# Patient Record
Sex: Female | Born: 1960 | Race: Black or African American | Hispanic: No | Smoking: Never smoker
Health system: Southern US, Community
[De-identification: ages and names within clinical notes are randomized; demographics above are authoritative.]

## PROBLEM LIST (undated history)

## (undated) DIAGNOSIS — I10 Essential (primary) hypertension: Secondary | ICD-10-CM

## (undated) DIAGNOSIS — K219 Gastro-esophageal reflux disease without esophagitis: Secondary | ICD-10-CM

## (undated) DIAGNOSIS — F32A Depression, unspecified: Secondary | ICD-10-CM

## (undated) DIAGNOSIS — R079 Chest pain, unspecified: Secondary | ICD-10-CM

## (undated) DIAGNOSIS — E079 Disorder of thyroid, unspecified: Secondary | ICD-10-CM

## (undated) DIAGNOSIS — E039 Hypothyroidism, unspecified: Secondary | ICD-10-CM

## (undated) DIAGNOSIS — R011 Cardiac murmur, unspecified: Secondary | ICD-10-CM

## (undated) HISTORY — DX: Depression, unspecified: F32.A

## (undated) HISTORY — DX: Cardiac murmur, unspecified: R01.1

## (undated) HISTORY — DX: Disorder of thyroid, unspecified: E07.9

## (undated) HISTORY — DX: Essential (primary) hypertension: I10

## (undated) HISTORY — DX: Hypothyroidism, unspecified: E03.9

## (undated) HISTORY — DX: Chest pain, unspecified: R07.9

## (undated) HISTORY — PX: ABDOMINAL HYSTERECTOMY: SHX81

---

## 2000-07-09 ENCOUNTER — Encounter: Admission: RE | Admit: 2000-07-09 | Discharge: 2000-07-09 | Payer: Self-pay | Admitting: Obstetrics and Gynecology

## 2000-07-09 ENCOUNTER — Encounter: Payer: Self-pay | Admitting: Obstetrics and Gynecology

## 2001-07-15 ENCOUNTER — Encounter: Admission: RE | Admit: 2001-07-15 | Discharge: 2001-07-15 | Payer: Self-pay | Admitting: Obstetrics and Gynecology

## 2001-07-15 ENCOUNTER — Encounter: Payer: Self-pay | Admitting: Obstetrics and Gynecology

## 2002-07-16 ENCOUNTER — Encounter: Payer: Self-pay | Admitting: Obstetrics and Gynecology

## 2002-07-16 ENCOUNTER — Encounter: Admission: RE | Admit: 2002-07-16 | Discharge: 2002-07-16 | Payer: Self-pay | Admitting: Obstetrics and Gynecology

## 2002-07-23 ENCOUNTER — Encounter: Admission: RE | Admit: 2002-07-23 | Discharge: 2002-07-23 | Payer: Self-pay | Admitting: Obstetrics and Gynecology

## 2002-07-23 ENCOUNTER — Encounter: Payer: Self-pay | Admitting: Obstetrics and Gynecology

## 2003-08-25 ENCOUNTER — Encounter: Admission: RE | Admit: 2003-08-25 | Discharge: 2003-08-25 | Payer: Self-pay | Admitting: Obstetrics and Gynecology

## 2004-10-03 ENCOUNTER — Ambulatory Visit (HOSPITAL_COMMUNITY): Admission: RE | Admit: 2004-10-03 | Discharge: 2004-10-03 | Payer: Self-pay | Admitting: Obstetrics and Gynecology

## 2005-10-23 ENCOUNTER — Encounter: Admission: RE | Admit: 2005-10-23 | Discharge: 2005-10-23 | Payer: Self-pay | Admitting: Obstetrics and Gynecology

## 2006-09-22 ENCOUNTER — Inpatient Hospital Stay (HOSPITAL_COMMUNITY): Admission: RE | Admit: 2006-09-22 | Discharge: 2006-09-24 | Payer: Self-pay | Admitting: Obstetrics & Gynecology

## 2006-09-22 ENCOUNTER — Encounter (INDEPENDENT_AMBULATORY_CARE_PROVIDER_SITE_OTHER): Payer: Self-pay | Admitting: Specialist

## 2006-11-05 ENCOUNTER — Encounter: Admission: RE | Admit: 2006-11-05 | Discharge: 2006-11-05 | Payer: Self-pay | Admitting: Obstetrics and Gynecology

## 2007-05-22 ENCOUNTER — Emergency Department (HOSPITAL_COMMUNITY): Admission: EM | Admit: 2007-05-22 | Discharge: 2007-05-22 | Payer: Self-pay | Admitting: Emergency Medicine

## 2007-05-25 ENCOUNTER — Ambulatory Visit (HOSPITAL_BASED_OUTPATIENT_CLINIC_OR_DEPARTMENT_OTHER): Admission: RE | Admit: 2007-05-25 | Discharge: 2007-05-25 | Payer: Self-pay | Admitting: Urology

## 2007-06-01 ENCOUNTER — Ambulatory Visit (HOSPITAL_COMMUNITY): Admission: RE | Admit: 2007-06-01 | Discharge: 2007-06-01 | Payer: Self-pay | Admitting: Urology

## 2007-11-19 ENCOUNTER — Encounter: Admission: RE | Admit: 2007-11-19 | Discharge: 2007-11-19 | Payer: Self-pay | Admitting: Obstetrics and Gynecology

## 2007-11-20 ENCOUNTER — Encounter: Admission: RE | Admit: 2007-11-20 | Discharge: 2007-11-20 | Payer: Self-pay | Admitting: Family Medicine

## 2008-02-25 ENCOUNTER — Encounter: Admission: RE | Admit: 2008-02-25 | Discharge: 2008-02-25 | Payer: Self-pay | Admitting: Family Medicine

## 2008-04-19 ENCOUNTER — Encounter: Admission: RE | Admit: 2008-04-19 | Discharge: 2008-04-19 | Payer: Self-pay | Admitting: General Surgery

## 2008-04-19 ENCOUNTER — Ambulatory Visit (HOSPITAL_COMMUNITY): Admission: RE | Admit: 2008-04-19 | Discharge: 2008-04-19 | Payer: Self-pay | Admitting: General Surgery

## 2008-04-19 ENCOUNTER — Encounter (INDEPENDENT_AMBULATORY_CARE_PROVIDER_SITE_OTHER): Payer: Self-pay | Admitting: General Surgery

## 2009-03-09 ENCOUNTER — Encounter: Admission: RE | Admit: 2009-03-09 | Discharge: 2009-03-09 | Payer: Self-pay | Admitting: Family Medicine

## 2010-03-12 ENCOUNTER — Encounter: Admission: RE | Admit: 2010-03-12 | Discharge: 2010-03-12 | Payer: Self-pay | Admitting: Internal Medicine

## 2010-10-07 ENCOUNTER — Encounter: Payer: Self-pay | Admitting: Family Medicine

## 2011-01-29 NOTE — Op Note (Signed)
NAME:  Debbie Brandt, Debbie Brandt               ACCOUNT NO.:  [id number]   MEDICAL RECORD NO.:  [id number]          PATIENT TYPE:  AMB   LOCATION:  [facility]                         FACILITY:  [facility]   PHYSICIAN:  Excell Seltzer. Annabell Howells, M.D.    DATE OF BIRTH:  1961-08-31   DATE OF PROCEDURE:  05/25/2007  DATE OF DISCHARGE:  05/25/2007                               OPERATIVE REPORT   PROCEDURE:  Cystoscopy, insertion of right double-J stent.   PREOPERATIVE DIAGNOSIS:  Right proximal ureteral stone.   POSTOPERATIVE DIAGNOSIS:  Right proximal ureteral stone.   DRAINS:  6-French x 24 cm double-J stent.   COMPLICATIONS:  None.   INDICATIONS:  The patient has a 5 x 8 mm right proximal ureteral stone  with obstruction.  She had had an ibuprofen earlier today, so was not a  candidate for immediate lithotripsy.  A stent is going to be placed and  she will be scheduled for lithotripsy next week.   FINDINGS AND PROCEDURE:  The patient was taken to the operating room.  She received Cipro.  A general anesthetic was induced.  She was placed  in lithotomy position.  Her perineum and genitalia were prepped with  Betadine solution.  She was draped in the usual sterile fashion.  Cystoscopy was performed using a 22-French scope and 12 and 70 degrees  lenses.  Examination revealed a normal urethra.  The bladder wall was  smooth and pale without tumor, stones or inflammation.  Ureteral  orifices were unremarkable.   A guidewire was then passed to the kidney under fluoroscopic guidance.  A 6-French 24 cm double-J stent was then placed over the wire and under  fluoroscopic guidance, the wire was removed leaving good coil in the  kidney, good coil in the bladder.  The bladder was drained.  The patient  was taken down from lithotomy position.  Her anesthetic was reversed.  She was moved to the recovery room in stable condition.  There were no  complications.      Excell Seltzer. Annabell Howells, M.D.  Electronically Signed     JJW/MEDQ   D:  06/04/2007  T:  06/04/2007  Job:  [id number]

## 2011-01-29 NOTE — Op Note (Signed)
NAME:  Debbie Brandt, Debbie Brandt               ACCOUNT NO.:  [id number]   MEDICAL RECORD NO.:  [id number]          PATIENT TYPE:  AMB   LOCATION:  [facility]S                          FACILITY:  [facility]   PHYSICIAN:  Angelia Mould. Derrell Lolling, M.D.DATE OF BIRTH:  03-May-1961   DATE OF PROCEDURE:  04/19/2008  DATE OF DISCHARGE:  04/19/2008                               OPERATIVE REPORT   PREOPERATIVE DIAGNOSIS:  Right breast mass.   POSTOPERATIVE DIAGNOSIS:  Right breast mass.   OPERATION PERFORMED:  Excision of right breast mass with needle  localization and specimen mammogram.   SURGEON:  Angelia Mould. Derrell Lolling, MD   OPERATIVE INDICATIONS:  This is a 50 year old black female who felt a  lump in her right breast in the upper outer quadrant about 5 months ago.  She had imaging studies and Dr. Beckie Salts performed ultrasound-guided  core biopsy of this area, which showed pathologically benign stromal  fibrosis.  Followup ultrasound on February 25, 2008, showed interval  significant increase in size of the irregular hypoechoic area at the 10  o'clock position of the right breast and he referred her to me for  excision.  On exam, she has a moderately large soft breast.  At 10  o'clock position, there is about a 2.5-cm palpable mass, which is not  fixed to the skin or the chest wall.  There is no axillary adenopathy.  Excision of this area was advised.  She underwent a needle localization  by Dr. Britta Mccreedy this morning who stated the tissues were extremely  fibrotic and dense, but the wire was placed through the area.  She was  then sent to [facility] for surgical excision.   OPERATIVE TECHNIQUE:  Following the needle localization, the patient was  brought to [facility] Operating Room.  She was taken to the operating  room and underwent general anesthesia with an LMA device.  The right  breast was prepped and draped in a sterile fashion.  The patient was  identified as correct patient, correct procedure, and  correct site.  Intravenous antibiotics were given.   I reviewed the x-rays showing the wire insertion area from lateral to  medial.  I could somewhat palpate the area of density.  I marked an  incision in the lateral aspect of the right breast in a curved  circumareolar orientation near the wire.  Marcaine 0.5% with epinephrine  was used as local infiltration anesthetic.  Incision was made.  Dissection was carried down into the breast tissue and widely around the  wire.  The breast tissue specimen was removed.  The specimen was marked  with the 6-color margin marker kit.  Specimen mammogram was performed  and Dr. Mayford Knife said that the metallic marker was within the center of  the specimen.  This was sent for routine histology.  The wound was  irrigated with saline.  Hemostasis was excellent and achieved with  electrocautery.  The deeper breast tissues were closed with interrupted  sutures of 3-0 Vicryl and the skin was closed with a running  subcuticular suture of 4-0 Monocryl and Dermabond.  Clean  bandages were  placed, and the patient was taken to the recovery room in stable  condition.   ESTIMATED BLOOD LOSS:  About 10 mL.   COMPLICATIONS:  None.   SPONGE, NEEDLE, AND COUNTS:  Correct.      Angelia Mould. Derrell Lolling, M.D.  Electronically Signed     HMI/MEDQ  D:  04/19/2008  T:  04/20/2008  Job:  [id number]   cc:   Jethro Bastos, M.D.  Gerrit Friends. Aldona Bar, M.D.

## 2011-02-01 NOTE — H&P (Signed)
NAME:  [name], [name]               ACCOUNT NO.:  [id number]   MEDICAL RECORD NO.:  [id number]          PATIENT TYPE:  AMB   LOCATION:  [facility]                           FACILITY:  [facility]   PHYSICIAN:  Gerrit Friends. Aldona Bar, M.D.   DATE OF BIRTH:  1961-04-12   DATE OF ADMISSION:  DATE OF DISCHARGE:                              HISTORY & PHYSICAL   DATE OF ADMISSION:  Monday, September 22, 2006   Debbie Brandt is a 50 year old gravida 1, para 1 who is being admitted  for a total abdominal hysterectomy with a preoperative diagnosis of a  likely submucosal myoma associated with bleeding and cramping.  This  patient was referred to me by Dr. Kyra Manges.  I saw this patient in  early November 2007.  Her history goes about 6 or 7years.  In 1999 she  underwent a laparoscopic tubal sterilization procedure and a  hysteroscopy and apparently at that time had a specimen taken at the  time of hysteroscopy which was consistent with a submucosal myoma.  She  had previously had a cesarean section for the delivery of her only child  several years earlier.  At that time she also underwent LUNA procedure.  In 2005, the patient related to Dr. Elana Alm that her periods were  becoming quite heavy and she did not want to use birth control pills.  Apparently at that time a hysterectomy was considered, but there was  difficulty in obtaining precertification.  She was placed on Depo-  Provera 150 mg IM every 3 months which did help her bleeding somewhat,  and this eventually had to be augmented with Premarin 0.625 mg once or  twice a day to prevent breakthrough bleeding.  She had an ultrasound in  early to mid 2007 consistent with a 6-cm uterine fibroid.  She was  referred to me in late October   Dictation ended at this point.      Gerrit Friends. Aldona Bar, M.D.  Electronically Signed     RMW/MEDQ  D:  09/19/2006  T:  09/19/2006  Job:  [id number]

## 2011-02-01 NOTE — H&P (Signed)
NAME:  Debbie Brandt, Debbie Brandt               ACCOUNT NO.:  [id number]   MEDICAL RECORD NO.:  [id number]          PATIENT TYPE:  AMB   LOCATION:  [facility]                           FACILITY:  [facility]   PHYSICIAN:  Gerrit Friends. Aldona Bar, M.D.   DATE OF BIRTH:  10-26-1960   DATE OF ADMISSION:  DATE OF DISCHARGE:                              HISTORY & PHYSICAL   HISTORY OF PRESENT ILLNESS:  This patient is to be admitted for surgery  on September 22, 2006.  Please have her history and physical to the Day  Surgery area.  She is scheduled for 7:30 that morning.  Debbie Brandt is  a 50 year old gravida 1, para 1 who is being admitted for a total  abdominal hysterectomy with a preoperative diagnosis of submucosal  myomas associated with dysmenorrhea.  Apparently, this problem goes back  about 5-6 years.  In late 1999, she underwent a laparoscopic tubal  sterilization procedure and a hysteroscopy and biopsy, and the biopsy  was consistent with a submucosal myoma.  Afterwards, she did fairly  well; although, in 2005, she began reporting to Dr. Elana Alm, the  referring physician, that her periods were becoming quite heavy.  Birth  control pills were suggested, but the patient did not want birth control  pills.  She actually was preferring a hysterectomy, but because of pre-  certification issues, a hysterectomy was a not a possibility.  Therefore, she was placed on Depo-Provera which she continued at 150 mg  IM every 3 months until probably late summer of 2007, at which time she  was referred to me for further management.  She was also begun on  Premarin 0.625 mg once to twice daily to prevent breakthrough bleeding.  It is of note as well that in early 2007 she had a pelvic ultrasound  which revealed a 6-cm uterine fibroid.  I saw this patient for the first  time in early November of 2007.  Patient was requesting a total  abdominal hysterectomy because of her continued bleeding and history of  fibroids and associated  dysmenorrhea.  She was examined at that time,  underwent a CBC, metabolic profile and TSH, all of which were normal.  Hysterectomy was scheduled, and was approved, and she is now being taken  to the OR for such procedure.  She did have a Pap smear in early 2007  which was within normal limits.  She had a mammogram in February of 2007  which was likewise reported as within normal limits.  Discussion was  carried out with the patient as to whether or not to remove her ovaries  in addition to the total abdominal hysterectomy.  The decision was made  to remove her ovaries only if intraoperative findings justified such.  She is now being taken to the operating room for total abdominal  hysterectomy for surgical treatment of her myomas, persistent  breakthrough bleeding and dysmenorrhea.   PAST MEDICAL HISTORY:  Patient has had a cesarean section with delivery  of her only child approximately 14 years ago and a laparoscopic tubal  sterilization with a hysteroscopy in 1999.  The patient has no known  allergies.   MEDICATIONS:  1. Levoxyl 0.75 mg daily.  2. Maxide - 25 - 1 every morning.  She recently discontinued her      Premarin 0.625 mg daily.   PAST MEDICAL HISTORY:  Otherwise essentially negative.   SOCIAL HISTORY:  Noncontributory.   FAMILY HISTORY:  Noncontributory.   REVIEW OF SYSTEMS:  Essentially negative.   PHYSICAL EXAMINATION:  VITAL SIGNS:  Blood pressure 140/70, temperature  98.2; pulse 80, and regular; respirations 18, and regular.  HEENT:  Negative.  NECK:  Thyroid not enlarged.  CHEST:  Clear to auscultation and percussion.  CARDIOVASCULAR:  Regular rhythm.  No murmurs.  BREASTS:  Negative.  ABDOMEN:  Finds a well-healed Pfannenstiel's incision from her previous  cesarean.  Abdomen is otherwise benign.  No masses are palpated.  Bowel  sounds are normal.  PELVIC:  Finds external genitalia and BUS normal.  Introitus __________  well supported.  Cervix with no gross  lesions.  Uterus feels to be upper  limits of normal size.  Adnexal area is negative.  RECTOVAGINAL:  Confirmatory.  NEUROLOGIC:  Physiologic.   IMPRESSION:  1. Known uterine fibroid, likely submucosal, with a history of      bleeding and dysmenorrhea.  2. Controlled hypothyroidism.   PLAN:  Patient will be taken to the operating room for surgical  treatment of her leiomyomatous uterus associated with bleeding and  cramping by undergoing a total abdominal hysterectomy.  Bilateral  salpingo-oophorectomy will be entertained if intraoperative findings  justifies such.  2.  The patient is aware of risks and benefits of  having the total abdominal hysterectomy, and wishes to proceed  accordingly.      Gerrit Friends. Aldona Bar, M.D.  Electronically Signed     RMW/MEDQ  D:  09/19/2006  T:  09/19/2006  Job:  [id number]

## 2011-02-01 NOTE — Discharge Summary (Signed)
Debbie Brandt, Debbie Brandt               ACCOUNT NO.:  [id number]   MEDICAL RECORD NO.:  [id number]          PATIENT TYPE:  INP   LOCATION:  9305                          FACILITY:  [facility]   PHYSICIAN:  Gerrit Friends. Aldona Bar, M.D.   DATE OF BIRTH:  04-30-1961   DATE OF ADMISSION:  09/22/2006  DATE OF DISCHARGE:                               DISCHARGE SUMMARY   DISCHARGE DIAGNOSES:  1. 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  2. Adenomyosis.  3. Clinical abnormal uterine bleeding and dysmenorrhea.   PROCEDURE:  Total abdominal hysterectomy.   SUMMARY:  This 50 year old patient was taken to the operating room on  the morning of September 22, 2006, having been referred to me by Dr. Kyra Manges for treatment of abnormal bleeding associated with dysmenorrhea  with a known submucosal myoma.  After a normal preoperative workup, the  patient was taken to the operating room at which time she underwent a  total abdominal hysterectomy.  Ovarian preservation was carried out  because the ovaries appeared completely normal, as did the appendix.   The procedure went well and postoperatively the patient's postoperative  course was totally benign.  Her comprehensive metabolic profile was  totally normal postoperatively and her postoperative hemoglobin was 9.4  with a white count of 8000 and a platelet count of 364,000.  On the  morning of January 9 she was ambulating well, tolerating a regular diet  well, having normal bowel and bladder function, was afebrile, her wound  was clean and dry, and she was desirous of discharge and accordingly was  discharged to home with appropriate instructions.   Her staples were removed on the morning of discharge and wound was steri-  stripped.  Discharge medications include iron 300 mg one to twice daily,  Motrin 600 mg every 6 hours as needed for pain, and Tylox one to two  every 4-6 hours as needed for more severe pain.  She discontinued her  Premarin which  she was using for management of her abnormal uterine  bleeding in association with Depo-Provera prior to the surgical  procedure, and after the surgical procedure continued to have no  menopausal symptoms; therefore, the Premarin was not restarted.   She will continue on her thyroid medication likewise postoperatively.   Followup in the office will be carried out in approximately 4 weeks'  time.  As mentioned, the patient received all explicit instructions at  the time of discharge and understood all instructions well.   The pathological report showed a 141-g uterus with a 4.5 x 3.0 x 3.0  submucosal myoma almost occupying the entire endometrial cavity, and  there were some additional small intramural myomas.  There was also  adenomyosis demonstrated.   CONDITION ON DISCHARGE:  Improved.      Gerrit Friends. Aldona Bar, M.D.  Electronically Signed     RMW/MEDQ  D:  09/24/2006  T:  09/24/2006  Job:  [id number]   cc:   S. Kyra Manges, M.D.  Fax: [phone]

## 2011-02-01 NOTE — Op Note (Signed)
NAME:  Debbie Brandt, Debbie Brandt               ACCOUNT NO.:  [id number]   MEDICAL RECORD NO.:  [id number]          PATIENT TYPE:  AMB   LOCATION:  [facility]                           FACILITY:  [facility]   PHYSICIAN:  Gerrit Friends. Aldona Bar, M.D.   DATE OF BIRTH:  [birth year]   DATE OF PROCEDURE:  09/22/2006  DATE OF DISCHARGE:                               OPERATIVE REPORT   PREOPERATIVE DIAGNOSES:  1. Leiomyomatous uterus.  2. Abnormal uterine bleeding.  3. Dysmenorrhea.   POSTOPERATIVE DIAGNOSES:  1. Leiomyomatous uterus.  2. Abnormal uterine bleeding.  3. Dysmenorrhea.  4. Pathology pending.   PROCEDURE:  Total abdominal hysterectomy.   SURGEON:  Gerrit Friends. Aldona Bar, M.D.   ASSISTANT:  Randye Lobo, M.D.   ANESTHESIA:  General endotracheal.   HISTORY:  This 50 year old gravida 1, para 1 (status post C-section) who  underwent a laparoscopic tubal and hysteroscopy in 1999 - Dr. Elana Alm -  with the findings of a submucosal myoma was referred to me in October  2007 by Dr. Elana Alm for further management.  Apparently he had tried, on  numerous occasions, to precertify the patient for a total abdominal  hysterectomy because of persistent bleeding from her submucosal myoma  which most recently measured 6 cm on ultrasound but was not able to  precertify the patient.  She was maintained on Depo-Provera to control  her bleeding which was augmented with 0.625 of  Premarin once or twice  daily to control further breakthrough bleeding which was associated with  dysmenorrhea.  After I saw the patient in October 2007, we were able to  precertify the patient and she is now being taken to the operating  according to her wishes for a total abdominal hysterectomy.  Ovarian  preservation was planned pending intra-abdominal inspection of the  ovaries.   PROCEDURE:  The patient was taken to the operating where after the  satisfactory of induction of general endotracheal anesthesia, she was  prepped and draped in the usual  fashion having placed in the supine  position.  The patient was prepped and draped vaginally and abdominally  and a Foley catheter was placed.  After the patient was adequately  draped procedure was begun.  A Pfannenstiel incision was made and with  minimal difficulty, dissected down sharply to and through the fascia in  a low transverse fashion with hemostasis created at each layer.  Subfascial space was created inferiorly and superiorly, muscles  separated in the midline, peritoneum identified and entered appropriate  with care taken to avoid the bowel superiorly and the bladder  inferiorly.   At this time, abdomen was inspected.  There were no intra-abdominal  adhesions.  Liver edge, undersurface of diaphragm felt normal.  In the  pelvis, the uterus was posterior, approximately 8-10 weeks' size,  somewhat softened but both ovaries and tubes appeared completely normal  - there was a previous tubal sterilization procedure noted.   At this time, the self-retaining O'Connor-O'Sullivan retractor was  placed and bowel was packed off without difficulty.  The corners of the  uterus was then grasped with long Kelly clamps and  hysterectomy was  begun in the usual fashion.  The round ligaments were suture secured and  opened with the Bovie, dissecting anteriorly and pushing down the  bladder flap without difficulty.  As both ovaries appeared completely  normal, both ovaries were left in situ and the ovarian pedicles were  clamped, cut and suture secured in a double fashion with 0 Vicryl  suture.  At this time, the uterine arteries were skeletonized using  curved Heaney clamp, both ureters were clamped, cut and suture secured  with 0 Vicryl suture.  Additional parametrial bites were taken using  straight Heaney clamps and suture ligature of 0 Vicryl suture down to  the vaginal angle which was then clamped with a curved Heaney clamps  bilaterally and suture secured with a Heaney suture of 0  Vicryl.  The  specimen at this  time was removed including the entire cervix and  uterus.  Remainder of the vaginal cuff was secured with figure-of-eight  0 Vicryl suture and profuse irrigation afterwards revealed good  hemostasis.  At this time, the ovarian pedicles were suspended to the  round ligament pedicles.  The procedure at this time was felt to be  complete.  During the procedure, the ureters were palpated bilaterally  and noted to be normal in their course and this was indeed the case as  well at this point in the procedure.   At this time, all packs were removed.  The appendix was visualize and  noted to be retrocecal but normal and was left in situ.   At this time, all packs were removed, retractor was removed.  All counts  noted to be correct.  No foreign bodies were noted to be remaining in  the abdominal cavity and closure of the abdomen at this time was carried  out in layers.  The abdominal peritoneum was closed with 2-0 Vicryl in a  running fashion and muscle secured with same.  Assured of good  subfascial hemostasis, the fascia was then reapproximated with 0 Vicryl  from angle to midline bilaterally.  Assured of good subcu hemostasis,  staples were then used to close the skin and a sterile pressure dressing  was applied.  The patient at this time was transport to the recovery  room in satisfactory condition having tolerated procedure well.  Estimated blood loss 100 mL.  All counts correct x2.   In summary, this patient underwent a total abdominal hysterectomy  because of a known submucosal myoma associated with abnormal bleeding  and dysmenorrhea.      Gerrit Friends. Aldona Bar, M.D.  Electronically Signed     RMW/MEDQ  D:  09/22/2006  T:  09/22/2006  Job:  [id number]

## 2011-02-06 ENCOUNTER — Other Ambulatory Visit: Payer: Self-pay | Admitting: Family Medicine

## 2011-02-06 ENCOUNTER — Ambulatory Visit
Admission: RE | Admit: 2011-02-06 | Discharge: 2011-02-06 | Disposition: A | Discharge status: Home / Self Care | Payer: Managed Care, Other (non HMO) | Source: Ambulatory Visit | Attending: Family Medicine | Admitting: Family Medicine

## 2011-02-06 DIAGNOSIS — IMO0002 Reserved for concepts with insufficient information to code with codable children: Secondary | ICD-10-CM

## 2011-02-18 ENCOUNTER — Ambulatory Visit: Payer: Managed Care, Other (non HMO) | Attending: Family Medicine

## 2011-02-18 DIAGNOSIS — IMO0001 Reserved for inherently not codable concepts without codable children: Secondary | ICD-10-CM | POA: Insufficient documentation

## 2011-02-18 DIAGNOSIS — M25559 Pain in unspecified hip: Secondary | ICD-10-CM | POA: Insufficient documentation

## 2011-02-18 DIAGNOSIS — M545 Low back pain, unspecified: Secondary | ICD-10-CM | POA: Insufficient documentation

## 2011-02-21 ENCOUNTER — Other Ambulatory Visit: Payer: Self-pay | Admitting: Family Medicine

## 2011-02-21 DIAGNOSIS — Z1231 Encounter for screening mammogram for malignant neoplasm of breast: Secondary | ICD-10-CM

## 2011-02-28 ENCOUNTER — Ambulatory Visit: Payer: Managed Care, Other (non HMO)

## 2011-03-08 ENCOUNTER — Ambulatory Visit: Payer: Managed Care, Other (non HMO)

## 2011-03-14 ENCOUNTER — Ambulatory Visit: Payer: Managed Care, Other (non HMO)

## 2011-03-21 ENCOUNTER — Ambulatory Visit: Payer: Managed Care, Other (non HMO)

## 2011-04-11 ENCOUNTER — Ambulatory Visit
Admission: RE | Admit: 2011-04-11 | Discharge: 2011-04-11 | Disposition: A | Discharge status: Home / Self Care | Payer: Managed Care, Other (non HMO) | Source: Ambulatory Visit | Attending: Family Medicine | Admitting: Family Medicine

## 2011-04-11 ENCOUNTER — Other Ambulatory Visit: Payer: Self-pay | Admitting: Family Medicine

## 2011-04-11 DIAGNOSIS — Z1231 Encounter for screening mammogram for malignant neoplasm of breast: Secondary | ICD-10-CM

## 2011-04-11 DIAGNOSIS — M25476 Effusion, unspecified foot: Secondary | ICD-10-CM

## 2011-04-11 DIAGNOSIS — M25473 Effusion, unspecified ankle: Secondary | ICD-10-CM

## 2011-06-14 LAB — DIFFERENTIAL
Basophils Absolute: 0
Lymphocytes Relative: 36
Neutro Abs: 2.7

## 2011-06-14 LAB — CBC
HCT: 38.8
Hemoglobin: 12.6
MCV: 84.4
Platelets: 415 — ABNORMAL HIGH
WBC: 4.9

## 2011-06-14 LAB — URINALYSIS, ROUTINE W REFLEX MICROSCOPIC
Nitrite: NEGATIVE
Specific Gravity, Urine: 1.009
pH: 5

## 2011-06-14 LAB — COMPREHENSIVE METABOLIC PANEL
ALT: 22
Alkaline Phosphatase: 85
BUN: 13
Calcium: 9.4
GFR calc non Af Amer: 58 — ABNORMAL LOW
Glucose, Bld: 99
Total Bilirubin: 0.7
Total Protein: 6.5

## 2011-06-28 LAB — URINALYSIS, ROUTINE W REFLEX MICROSCOPIC
Bilirubin Urine: NEGATIVE
Nitrite: NEGATIVE
Specific Gravity, Urine: 1.022
Urobilinogen, UA: 0.2

## 2011-06-28 LAB — POCT I-STAT 4, (NA,K, GLUC, HGB,HCT)
Hemoglobin: 15
Potassium: 4

## 2011-06-28 LAB — URINE MICROSCOPIC-ADD ON

## 2012-03-24 ENCOUNTER — Other Ambulatory Visit: Payer: Self-pay | Admitting: Family Medicine

## 2012-03-24 DIAGNOSIS — Z1231 Encounter for screening mammogram for malignant neoplasm of breast: Secondary | ICD-10-CM

## 2012-04-15 ENCOUNTER — Other Ambulatory Visit: Payer: Self-pay | Admitting: Family Medicine

## 2012-04-15 DIAGNOSIS — M549 Dorsalgia, unspecified: Secondary | ICD-10-CM

## 2012-04-16 ENCOUNTER — Ambulatory Visit: Payer: Managed Care, Other (non HMO)

## 2012-04-25 ENCOUNTER — Ambulatory Visit
Admission: RE | Admit: 2012-04-25 | Discharge: 2012-04-25 | Disposition: A | Discharge status: Home / Self Care | Payer: Managed Care, Other (non HMO) | Source: Ambulatory Visit | Attending: Family Medicine | Admitting: Family Medicine

## 2012-04-25 DIAGNOSIS — M549 Dorsalgia, unspecified: Secondary | ICD-10-CM

## 2012-04-29 ENCOUNTER — Ambulatory Visit
Admission: RE | Admit: 2012-04-29 | Discharge: 2012-04-29 | Disposition: A | Discharge status: Home / Self Care | Payer: Managed Care, Other (non HMO) | Source: Ambulatory Visit | Attending: Family Medicine | Admitting: Family Medicine

## 2012-04-29 DIAGNOSIS — Z1231 Encounter for screening mammogram for malignant neoplasm of breast: Secondary | ICD-10-CM

## 2012-05-01 ENCOUNTER — Other Ambulatory Visit: Payer: Self-pay | Admitting: Family Medicine

## 2012-05-01 DIAGNOSIS — R928 Other abnormal and inconclusive findings on diagnostic imaging of breast: Secondary | ICD-10-CM

## 2012-05-08 ENCOUNTER — Ambulatory Visit
Admission: RE | Admit: 2012-05-08 | Discharge: 2012-05-08 | Disposition: A | Discharge status: Home / Self Care | Payer: Managed Care, Other (non HMO) | Source: Ambulatory Visit | Attending: Family Medicine | Admitting: Family Medicine

## 2012-05-08 DIAGNOSIS — R928 Other abnormal and inconclusive findings on diagnostic imaging of breast: Secondary | ICD-10-CM

## 2013-04-12 ENCOUNTER — Other Ambulatory Visit: Payer: Self-pay | Admitting: Gastroenterology

## 2013-04-20 ENCOUNTER — Other Ambulatory Visit: Payer: Self-pay

## 2013-04-20 DIAGNOSIS — Z1231 Encounter for screening mammogram for malignant neoplasm of breast: Secondary | ICD-10-CM

## 2013-05-10 ENCOUNTER — Ambulatory Visit: Payer: Managed Care, Other (non HMO)

## 2013-05-19 ENCOUNTER — Ambulatory Visit: Payer: Managed Care, Other (non HMO)

## 2013-08-10 ENCOUNTER — Ambulatory Visit: Payer: Managed Care, Other (non HMO)

## 2013-10-26 ENCOUNTER — Ambulatory Visit: Payer: Managed Care, Other (non HMO) | Attending: Family Medicine | Admitting: Physical Therapy

## 2013-10-26 DIAGNOSIS — I10 Essential (primary) hypertension: Secondary | ICD-10-CM | POA: Insufficient documentation

## 2013-10-26 DIAGNOSIS — M545 Low back pain, unspecified: Secondary | ICD-10-CM | POA: Insufficient documentation

## 2013-10-26 DIAGNOSIS — R5381 Other malaise: Secondary | ICD-10-CM | POA: Insufficient documentation

## 2013-10-26 DIAGNOSIS — E039 Hypothyroidism, unspecified: Secondary | ICD-10-CM | POA: Insufficient documentation

## 2013-10-26 DIAGNOSIS — IMO0001 Reserved for inherently not codable concepts without codable children: Secondary | ICD-10-CM | POA: Insufficient documentation

## 2013-11-04 ENCOUNTER — Ambulatory Visit: Payer: Managed Care, Other (non HMO) | Admitting: Physical Therapy

## 2013-11-11 ENCOUNTER — Ambulatory Visit: Payer: Managed Care, Other (non HMO) | Admitting: Physical Therapy

## 2013-11-18 ENCOUNTER — Ambulatory Visit: Payer: Managed Care, Other (non HMO) | Attending: Family Medicine | Admitting: Physical Therapy

## 2013-11-18 DIAGNOSIS — I10 Essential (primary) hypertension: Secondary | ICD-10-CM | POA: Insufficient documentation

## 2013-11-18 DIAGNOSIS — R5381 Other malaise: Secondary | ICD-10-CM | POA: Insufficient documentation

## 2013-11-18 DIAGNOSIS — M545 Low back pain, unspecified: Secondary | ICD-10-CM | POA: Insufficient documentation

## 2013-11-18 DIAGNOSIS — IMO0001 Reserved for inherently not codable concepts without codable children: Secondary | ICD-10-CM | POA: Insufficient documentation

## 2013-11-18 DIAGNOSIS — E039 Hypothyroidism, unspecified: Secondary | ICD-10-CM | POA: Insufficient documentation

## 2013-11-25 ENCOUNTER — Ambulatory Visit: Payer: Managed Care, Other (non HMO) | Admitting: Physical Therapy

## 2013-12-09 ENCOUNTER — Ambulatory Visit: Payer: Managed Care, Other (non HMO) | Admitting: Physical Therapy

## 2013-12-16 ENCOUNTER — Ambulatory Visit: Payer: Managed Care, Other (non HMO) | Admitting: Physical Therapy

## 2014-11-26 ENCOUNTER — Ambulatory Visit (INDEPENDENT_AMBULATORY_CARE_PROVIDER_SITE_OTHER): Payer: Managed Care, Other (non HMO) | Admitting: Family Medicine

## 2014-11-26 ENCOUNTER — Ambulatory Visit (INDEPENDENT_AMBULATORY_CARE_PROVIDER_SITE_OTHER): Payer: Managed Care, Other (non HMO)

## 2014-11-26 VITALS — BP 152/86 | HR 71 | Temp 97.8°F | Resp 21 | Ht 63.75 in | Wt 162.8 lb

## 2014-11-26 DIAGNOSIS — M79605 Pain in left leg: Secondary | ICD-10-CM | POA: Diagnosis not present

## 2014-11-26 DIAGNOSIS — I10 Essential (primary) hypertension: Secondary | ICD-10-CM

## 2014-11-26 DIAGNOSIS — M79652 Pain in left thigh: Secondary | ICD-10-CM

## 2014-11-26 DIAGNOSIS — E039 Hypothyroidism, unspecified: Secondary | ICD-10-CM

## 2014-11-26 LAB — POCT CBC
Granulocyte percent: 62.6 % (ref 37–80)
HCT, POC: 44.5 % (ref 37.7–47.9)
Hemoglobin: 13.8 g/dL (ref 12.2–16.2)
Lymph, poc: 1.7 (ref 0.6–3.4)
MCH, POC: 26 pg — AB (ref 27–31.2)
MCHC: 31 g/dL — AB (ref 31.8–35.4)
MCV: 83.9 fL (ref 80–97)
MID (cbc): 0.3 (ref 0–0.9)
MPV: 7.5 fL (ref 0–99.8)
POC Granulocyte: 3.4 (ref 2–6.9)
POC LYMPH PERCENT: 31.7 %L (ref 10–50)
POC MID %: 5.7 %M (ref 0–12)
Platelet Count, POC: 421 10*3/uL (ref 142–424)
RBC: 5.31 M/uL (ref 4.04–5.48)
RDW, POC: 14.9 %
WBC: 5.5 10*3/uL (ref 4.6–10.2)

## 2014-11-26 LAB — COMPREHENSIVE METABOLIC PANEL
AST: 20 U/L (ref 0–37)
Albumin: 4.3 g/dL (ref 3.5–5.2)
CO2: 26 mEq/L (ref 19–32)
Calcium: 9.9 mg/dL (ref 8.4–10.5)
Total Protein: 7.6 g/dL (ref 6.0–8.3)

## 2014-11-26 LAB — COMPREHENSIVE METABOLIC PANEL WITH GFR
ALT: 18 U/L (ref 0–35)
Alkaline Phosphatase: 66 U/L (ref 39–117)
BUN: 14 mg/dL (ref 6–23)
Chloride: 102 meq/L (ref 96–112)
Creat: 0.85 mg/dL (ref 0.50–1.10)
Glucose, Bld: 87 mg/dL (ref 70–99)
Potassium: 4.6 meq/L (ref 3.5–5.3)
Sodium: 139 meq/L (ref 135–145)
Total Bilirubin: 0.6 mg/dL (ref 0.2–1.2)

## 2014-11-26 LAB — TSH: TSH: 1.925 u[IU]/mL (ref 0.350–4.500)

## 2014-11-26 MED ORDER — CYCLOBENZAPRINE HCL 5 MG PO TABS: 5.0000 mg | ORAL_TABLET | Freq: Three times a day (TID) | ORAL | Status: DC | PRN

## 2014-11-26 MED ORDER — HYDROCODONE-ACETAMINOPHEN 5-325 MG PO TABS: 1.0000 | ORAL_TABLET | Freq: Three times a day (TID) | ORAL | Status: DC | PRN

## 2014-11-26 NOTE — Progress Notes (Signed)
Chief Complaint:  Chief Complaint  Patient presents with  . Leg Pain    LEFT LEG, SINCE YESTERDAY, STARTED HURTING ALL OF SUDDEN NO FALLS     HPI: Debbie FlemingsCynthia L Brandt is a 54 y.o. female who is here for  for 2 day history of left thigh pain. She has had no known injury or trauma. She woke up with constant sharp femur pain. It is like a band around the mid thigh. She started walking again, before this all happened she was walking 2 miles at a quick pace. This was the second time that she has been out walking since the weather has gotten good. She has a history of angina, hypothyroidism. She is on triamterene hydrochlorothiazide. She is drinking a lot of water. She has tried quinine in it just irritates her potassium level. It seemed to ease off at first but then last night it got worse. She has not tried anything for it except Advil without relief. She has had no prior trauma. She has no rashes. She has no varicose veins. She has never had pain like this before. She is limping. She denies any numbness or tingling. She denies any burning. She just feels like the pain is bandlike pattern. It is sharp. She has no risk factors for DVT.Low/no risk factors of PE/DVT. Denies recent trauma/surgeires, hx of DVT/PE, long car or plane rides, sedentary lifestyle, OCP use, or malignancy Denies any low back pain.  Since cleaning take a low-fat 18. General watch the color purple. Eat and shop.    Past Medical History  Diagnosis Date  . Thyroid disease   . Hypertension    Past Surgical History  Procedure Laterality Date  . Cesarean section    . Abdominal hysterectomy     History   Social History  . Marital Status: Widowed    Spouse Name: N/A  . Number of Children: N/A  . Years of Education: N/A   Social History Main Topics  . Smoking status: Never Smoker   . Smokeless tobacco: Never Used  . Alcohol Use: No  . Drug Use: No  . Sexual Activity: Not on file   Other Topics Concern  .  None   Social History Narrative  . None   Family History  Problem Relation Age of Onset  . Cancer Mother   . Hypertension Mother   . Cancer Father   . Mental illness Sister   . Hypertension Sister   . Hyperlipidemia Sister   . Diabetes Brother   . Hypertension Brother    No Known Allergies Prior to Admission medications   Medication Sig Start Date End Date Taking? Authorizing Provider  b complex vitamins capsule Take 1 capsule by mouth daily.   Yes Historical Provider, MD  Calcium Carbonate-Vitamin D 600-400 MG-UNIT per tablet Take 1 tablet by mouth daily.   Yes Historical Provider, MD  COD LIVER OIL PO Take 1 capsule by mouth daily.   Yes Historical Provider, MD  GARLIC PO Take 161500 mg by mouth daily.   Yes Historical Provider, MD  levothyroxine (SYNTHROID, LEVOTHROID) 75 MCG tablet Take 75 mcg by mouth daily before breakfast.   Yes Historical Provider, MD  MULTIPLE VITAMIN PO Take 1 tablet by mouth daily.   Yes Historical Provider, MD  triamterene-hydrochlorothiazide (MAXZIDE-25) 37.5-25 MG per tablet Take 1 tablet by mouth daily.   Yes Historical Provider, MD     ROS: The patient denies fevers, chills, night sweats, unintentional weight loss,  chest pain, palpitations, wheezing, dyspnea on exertion, nausea, vomiting, abdominal pain, dysuria, hematuria, melena, numbness, weakness, or tingling.   All other systems have been reviewed and were otherwise negative with the exception of those mentioned in the HPI and as above.    PHYSICAL EXAM: Filed Vitals:   11/26/14 1145  BP: 152/86  Pulse: 71  Temp: 97.8 F (36.6 C)  Resp: 21   Filed Vitals:   11/26/14 1145  Height: 5' 3.75" (1.619 m)  Weight: 162 lb 12.8 oz (73.846 kg)   Body mass index is 28.17 kg/(m^2).  General: Alert, no acute distress HEENT:  Normocephalic, atraumatic, oropharynx patent. EOMI, PERRLA Cardiovascular:  Regular rate and rhythm, no rubs murmurs or gallops.  No Carotid bruits, radial pulse intact. No  pedal edema.  Respiratory: Clear to auscultation bilaterally.  No wheezes, rales, or rhonchi.  No cyanosis, no use of accessory musculature GI: No organomegaly, abdomen is soft and non-tender, positive bowel sounds.  No masses. Skin: No rashes. Neurologic: Facial musculature symmetric. Psychiatric: Patient is appropriate throughout our interaction. Lymphatic: No cervical lymphadenopathy Musculoskeletal: Gait is antalgic Nontender lumbar spine. She has no pain with flexion of her lumbar spine or rotation. Nontender hip. She has a nontender greater trochanter She has no saddle anesthesia. Plantar plantar and dorsiflexion of the foot is normal, 5 out of 5 strength. No appreciable asymmetry. No warmth. If Homans sign in her left calf.    LABS: Results for orders placed or performed in visit on 11/26/14  POCT CBC  Result Value Ref Range   WBC 5.5 4.6 - 10.2 K/uL   Lymph, poc 1.7 0.6 - 3.4   POC LYMPH PERCENT 31.7 10 - 50 %L   MID (cbc) 0.3 0 - 0.9   POC MID % 5.7 0 - 12 %M   POC Granulocyte 3.4 2 - 6.9   Granulocyte percent 62.6 37 - 80 %G   RBC 5.31 4.04 - 5.48 M/uL   Hemoglobin 13.8 12.2 - 16.2 g/dL   HCT, POC 16.1 09.6 - 47.9 %   MCV 83.9 80 - 97 fL   MCH, POC 26.0 (A) 27 - 31.2 pg   MCHC 31.0 (A) 31.8 - 35.4 g/dL   RDW, POC 04.5 %   Platelet Count, POC 421 142 - 424 K/uL   MPV 7.5 0 - 99.8 fL     EKG/XRAY:   Primary read interpreted by Dr. Conley Rolls at [facility]. Negative for any fracture or dislocation.   ASSESSMENT/PLAN: Encounter Diagnoses  Name Primary?  . Left thigh pain Yes  . Hypothyroidism, unspecified hypothyroidism type   . Essential hypertension    Pleasant 54 year old female who has a past medical history of hypertension and hypothyroidism. She is on triamterene HCTZ. She presents with left thigh pain in a dermatomal pattern, described as sharp 10 out of 10 bandlike pain around her mid thigh 2 days.  Questionable etiology muscle sprains/strains versus  electrolyte imbalance versus possible early shingles symptoms. CMP, TSH pending Monitor for any rashes, if rash appears to need to be treated with Valtrex If she has a potassium deficiency then will replete. The cramps from potassium deficiencies cyst along the whole leg and not just in a certain bandlike pattern Prescribed Flexeril and Norco when necessary pain Follow-up when necessary   Gross sideeffects, risk and benefits, and alternatives of medications d/w patient. Patient is aware that all medications have potential sideeffects and we are unable to predict every sideeffect or drug-drug interaction that may occur.  LE, THAO PHUONG, DO 11/26/2014 1:40 PM

## 2014-11-27 ENCOUNTER — Telehealth: Payer: Self-pay | Admitting: Family Medicine

## 2014-11-27 DIAGNOSIS — M62838 Other muscle spasm: Secondary | ICD-10-CM

## 2014-11-27 MED ORDER — IBUPROFEN 800 MG PO TABS: 800.0000 mg | ORAL_TABLET | Freq: Three times a day (TID) | ORAL | Status: DC | PRN

## 2014-11-27 NOTE — Telephone Encounter (Signed)
Patient called requesting lab results

## 2014-11-27 NOTE — Telephone Encounter (Signed)
Spoke to her about labs, a little better, work note written to be oow till 3/16. rx ibuprofen

## 2015-08-23 ENCOUNTER — Telehealth: Payer: Self-pay | Admitting: Cardiovascular Disease

## 2015-08-23 NOTE — Telephone Encounter (Signed)
[facility] for appointment on 09/15/15 with Dr Duke Salviaandolph.  [facility] ([facility] for Dr Leonides Sakeandolph's schedule on 09/15/15. lp

## 2015-09-15 ENCOUNTER — Ambulatory Visit: Payer: Managed Care, Other (non HMO) | Admitting: Cardiovascular Disease

## 2015-09-25 NOTE — Progress Notes (Signed)
Cardiology Office Note   Date:  09/26/2015   ID:  Debbie Brandt, DOB 12-25-1960, MRN [id number]  PCP:  Darrow Bussing, MD  Cardiologist:   Madilyn Hook, MD   Chief Complaint  Patient presents with  . New Evaluation    patient report chest discomfort/"odd feeling" and that her heart also races. thesse occur at random times      History of Present Illness: Debbie Brandt is a 55 y.o. female with hypertension and hypothyroidism who presents for palpitations.  Debbie Brandt has been noticing palpitations.  The symptoms started after she lost her son a little over a year ago.  This is especially common at night.  It has also occurred at work.  It feels like her heart is racing.  She thinks it may be related to anxiety or stress.   She has been feeling stressed and lonely after the death of her husband 3 years ago and her son 1 year ago.  She is also caring for her sister who has Alzheimer's.  She recently started seeing a therapist who recommended that she allow herself to cry and grieve, as she was previously trying to keep it all inside.  The palpitations haven't happened in the last three weeks, but prior to that they were occuring every week, up to twice per week.  Each episode lasts for 5-10 minutes.  It improves with rest.  The episodes are not associated with shortness of breath but are associated with chest pain.  There is no lightheadedness or dizziness.  Ms. Debbie Brandt reported palpitations to her OB/GYN, Dr. Antony Blackbird, on 08/22/15. She had a urinalysis at that appointment that was negative for infection.  Dr. Aldona Bar referred her to cardiology for further evaluation.  [name] does not get any exercise anymore.  She used to like to walk 2 miles but has not done this in 1.5 years.  She has a Control and instrumentation engineer at work who calls her once per month. Her goal is to walk 20 minutes at least once per week.  She denies lower extremity edema, orthopnea or PND.  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 She has significant work stress, as she has to cover many jobs when others are not available.    Past Medical History  Diagnosis Date  . Thyroid disease   . Hypertension   . Murmur 09/26/2015  . Chest pain 09/26/2015  . Essential hypertension 09/26/2015  . Hypothyroidism 09/26/2015    Past Surgical History  Procedure Laterality Date  . Cesarean section    . Abdominal hysterectomy       Current Outpatient Prescriptions  Medication Sig Dispense Refill  . b complex vitamins capsule Take 1 capsule by mouth daily.    . Calcium Carbonate-Vitamin D 600-400 MG-UNIT per tablet Take 1 tablet by mouth daily.    Marland Kitchen ibuprofen (ADVIL,MOTRIN) 800 MG tablet Take 1 tablet (800 mg total) by mouth every 8 (eight) hours as needed. 30 tablet 0  . levothyroxine (SYNTHROID, LEVOTHROID) 75 MCG tablet Take 75 mcg by mouth daily before breakfast.    . MULTIPLE VITAMIN PO Take 1 tablet by mouth daily.    Marland Kitchen triamterene-hydrochlorothiazide (MAXZIDE-25) 37.5-25 MG per tablet Take 1 tablet by mouth daily.     No current facility-administered medications for this visit.    Allergies:   Review of patient's allergies indicates no known allergies.    Social History:  The patient  reports that she has never  smoked. She has never used smokeless tobacco. She reports that she does not drink alcohol or use illicit drugs.   Family History:  The patient's family history includes Cancer in her father and mother; Diabetes in her brother; Heart attack in her father; Hyperlipidemia in her sister; Hypertension in her brother, mother, and sister; Mental illness in her sister.    ROS:  Please see the history of present illness.   Otherwise, review of systems are positive for none.   All other systems are reviewed and negative.    PHYSICAL EXAM: VS:  BP 138/60 mmHg  Pulse 71  Ht 5\' 2"  (1.575 m)  Wt 75.978 kg (167 lb 8 oz)  BMI 30.63 kg/m2 , BMI Body mass index is 30.63 kg/(m^2). GENERAL:  Well  appearing HEENT:  Pupils equal round and reactive, fundi not visualized, oral mucosa unremarkable NECK:  No jugular venous distention, waveform within normal limits, carotid upstroke brisk and symmetric, no bruits, no thyromegaly LYMPHATICS:  No cervical adenopathy LUNGS:  Clear to auscultation bilaterally HEART:  RRR.  PMI not displaced or sustained,S1 and S2 within normal limits, no S3, no S4, no clicks, no rubs, II/VI sysotlic murmur at the LUSB. ABD:  Flat, positive bowel sounds normal in frequency in pitch, no bruits, no rebound, no guarding, no midline pulsatile mass, no hepatomegaly, no splenomegaly EXT:  2 plus pulses throughout, no edema, no cyanosis no clubbing SKIN:  No rashes no nodules NEURO:  Cranial nerves II through XII grossly intact, motor grossly intact throughout PSYCH:  Cognitively intact, oriented to person place and time    EKG:  EKG is ordered today. The ekg ordered today demonstrates sinus rhythm. Rate 71 bpm.   Recent Labs: 11/26/2014: ALT 18; BUN 14; Creat 0.85; Hemoglobin 13.8; Potassium 4.6; Sodium 139; TSH 1.925    Lipid Panel No results found for: CHOL, TRIG, HDL, CHOLHDL, VLDL, LDLCALC, LDLDIRECT    Wt Readings from Last 3 Encounters:  09/26/15 75.978 kg (167 lb 8 oz)  11/26/14 73.846 kg (162 lb 12.8 oz)      ASSESSMENT AND PLAN:  # Palpitations: I suspect that Debbie Brandt's palpitations are mostly attributable to stress and anxiety.Her symptoms are not exertional in nature and seemed to be brought on by stress.  Also, the symptoms seem to have improved after she started therapy.  Her thyroid function has not been checked recently.  We will check a TSH as well as a BMP, CBC and magnesium.  Given that the palpitations are no longer occurring regularly, we will not pursue ambulatory monitoring at this time. If it starts to occur more frequently,she will call us and we will obtaineither a Holter or event monitor.  # Murmur: Ms. [name] has a soft systolic  murmur on exam. She reports never been told she had a murmur before. Given this finding in her symptoms of shortness of breath and palpitations, we will obtain an echocardiogram to evaluate for structural heart disease.  # Chest pain: Symptoms seem most consistent with panic/anxiety.  However, we will obtain an Exercise Cardiolite to evaluate for ischemia.  Her main risk factor is hypertension.  # Hypertension: BP well-controlled.  Continue Maxide.  # CV Disease prevention: Ms. [name] reports that her lipids are checked regularly by her PCP.  We discussed the importance of getting 30-40 minutes of exercise most days of the week.If her stress testing within normal limits, she will be encouraged to do so.  Current medicines are reviewed at length with the  patient today.  The patient does not have concerns regarding medicines.  The following changes have been made:  none  Labs/ tests ordered today include:     Orders Placed This Encounter  Procedures  . Basic metabolic panel  . Magnesium  . TSH  . Myocardial Perfusion Imaging  . EKG 12-Lead  . ECHOCARDIOGRAM COMPLETE     Disposition:   FU with Zakariya Knickerbocker C. Duke Salvia, MD, [facility] in 6 months.   This note was written with the assistance of speech recognition software.  Please excuse any transcriptional errors.  Signed, Ilse Billman C. Duke Salvia, MD, [facility]  09/26/2015 11:34 AM    [facility]

## 2015-09-26 ENCOUNTER — Ambulatory Visit (INDEPENDENT_AMBULATORY_CARE_PROVIDER_SITE_OTHER): Payer: Managed Care, Other (non HMO) | Admitting: Cardiovascular Disease

## 2015-09-26 ENCOUNTER — Encounter: Payer: Self-pay | Admitting: Cardiovascular Disease

## 2015-09-26 VITALS — BP 138/60 | HR 71 | Ht 62.0 in | Wt 167.5 lb

## 2015-09-26 DIAGNOSIS — R079 Chest pain, unspecified: Secondary | ICD-10-CM | POA: Diagnosis not present

## 2015-09-26 DIAGNOSIS — R011 Cardiac murmur, unspecified: Secondary | ICD-10-CM

## 2015-09-26 DIAGNOSIS — R0602 Shortness of breath: Secondary | ICD-10-CM

## 2015-09-26 DIAGNOSIS — E039 Hypothyroidism, unspecified: Secondary | ICD-10-CM | POA: Insufficient documentation

## 2015-09-26 DIAGNOSIS — R002 Palpitations: Secondary | ICD-10-CM | POA: Diagnosis not present

## 2015-09-26 DIAGNOSIS — I10 Essential (primary) hypertension: Secondary | ICD-10-CM | POA: Insufficient documentation

## 2015-09-26 DIAGNOSIS — R072 Precordial pain: Secondary | ICD-10-CM

## 2015-09-26 HISTORY — DX: Chest pain, unspecified: R07.9

## 2015-09-26 HISTORY — DX: Essential (primary) hypertension: I10

## 2015-09-26 HISTORY — DX: Cardiac murmur, unspecified: R01.1

## 2015-09-26 HISTORY — DX: Hypothyroidism, unspecified: E03.9

## 2015-09-26 LAB — BASIC METABOLIC PANEL
BUN: 14 mg/dL (ref 7–25)
CHLORIDE: 102 mmol/L (ref 98–110)
CO2: 28 mmol/L (ref 20–31)
Calcium: 9.5 mg/dL (ref 8.6–10.4)
Creat: 0.83 mg/dL (ref 0.50–1.05)
Glucose, Bld: 94 mg/dL (ref 65–99)
POTASSIUM: 3.9 mmol/L (ref 3.5–5.3)
SODIUM: 140 mmol/L (ref 135–146)

## 2015-09-26 LAB — TSH: TSH: 3.262 u[IU]/mL (ref 0.350–4.500)

## 2015-09-26 LAB — MAGNESIUM: Magnesium: 1.9 mg/dL (ref 1.5–2.5)

## 2015-09-26 NOTE — Patient Instructions (Signed)
Medication Instructions:  Please continue your current medications  Labwork: Your physician recommends that you return for lab work TODAY.  Testing/Procedures: 1. Echocardiogram - Your physician has requested that you have an echocardiogram. Echocardiography is a painless test that uses sound waves to create images of your heart. It provides your doctor with information about the size and shape of your heart and how well your heart's chambers and valves are working. This procedure takes approximately one hour. There are no restrictions for this procedure.  2. Exercise Cardiolite Stress Test - Your physician has requested that you have en exercise stress myoview. For further information please visit [email or web address]. Please follow instruction sheet, as given.  Follow-Up: Dr Duke Salviaandolph recommends that you schedule a follow-up appointment in 6 months. You will receive a reminder letter in the mail two months in advance. If you don't receive a letter, please call our office to schedule the follow-up appointment.  If you need a refill on your cardiac medications before your next appointment, please call your pharmacy.

## 2015-10-03 ENCOUNTER — Telehealth (HOSPITAL_COMMUNITY): Payer: Self-pay

## 2015-10-03 NOTE — Telephone Encounter (Signed)
Encounter complete. 

## 2015-10-05 ENCOUNTER — Ambulatory Visit (HOSPITAL_COMMUNITY)
Admission: RE | Admit: 2015-10-05 | Discharge: 2015-10-05 | Disposition: A | Discharge status: Home / Self Care | Payer: Managed Care, Other (non HMO) | Source: Ambulatory Visit | Attending: Cardiovascular Disease | Admitting: Cardiovascular Disease

## 2015-10-05 DIAGNOSIS — R0602 Shortness of breath: Secondary | ICD-10-CM | POA: Diagnosis not present

## 2015-10-05 DIAGNOSIS — I10 Essential (primary) hypertension: Secondary | ICD-10-CM | POA: Diagnosis not present

## 2015-10-05 DIAGNOSIS — R079 Chest pain, unspecified: Secondary | ICD-10-CM | POA: Diagnosis not present

## 2015-10-05 DIAGNOSIS — R002 Palpitations: Secondary | ICD-10-CM | POA: Insufficient documentation

## 2015-10-05 LAB — MYOCARDIAL PERFUSION IMAGING
CHL CUP MPHR: 164 {beats}/min
CHL CUP NUCLEAR SDS: 0
CHL CUP NUCLEAR SRS: 0
CHL CUP RESTING HR STRESS: 68 {beats}/min
CHL RATE OF PERCEIVED EXERTION: 16
CSEPEDS: 2 s
CSEPEW: 10.1 METS
CSEPHR: 95 %
Exercise duration (min): 8 min
LV sys vol: 24 mL
LVDIAVOL: 73 mL
Peak HR: 157 {beats}/min
SSS: 0
TID: 1.18

## 2015-10-05 MED ORDER — TECHNETIUM TC 99M SESTAMIBI GENERIC - CARDIOLITE
9.7000 | Freq: Once | INTRAVENOUS | Status: AC | PRN
  Administered 2015-10-05: 9.7 via INTRAVENOUS

## 2015-10-05 MED ORDER — TECHNETIUM TC 99M SESTAMIBI GENERIC - CARDIOLITE
31.4000 | Freq: Once | INTRAVENOUS | Status: AC | PRN
Start: 2015-10-05 — End: 2015-10-05
  Administered 2015-10-05: 31.4 via INTRAVENOUS

## 2015-10-06 ENCOUNTER — Telehealth: Payer: Self-pay | Admitting: *Deleted

## 2015-10-06 NOTE — Telephone Encounter (Signed)
Left secure detailed message on voicemail  any question may call back

## 2015-10-06 NOTE — Telephone Encounter (Signed)
-----   Message from Chilton Si, MD sent at 10/05/2015  6:15 PM EST ----- Normal stress test.

## 2015-10-09 ENCOUNTER — Other Ambulatory Visit: Payer: Self-pay

## 2015-10-09 ENCOUNTER — Ambulatory Visit (HOSPITAL_COMMUNITY): Payer: Managed Care, Other (non HMO) | Attending: Internal Medicine

## 2015-10-09 DIAGNOSIS — I340 Nonrheumatic mitral (valve) insufficiency: Secondary | ICD-10-CM | POA: Diagnosis not present

## 2015-10-09 DIAGNOSIS — I071 Rheumatic tricuspid insufficiency: Secondary | ICD-10-CM | POA: Diagnosis not present

## 2015-10-09 DIAGNOSIS — R079 Chest pain, unspecified: Secondary | ICD-10-CM | POA: Insufficient documentation

## 2015-10-09 DIAGNOSIS — I10 Essential (primary) hypertension: Secondary | ICD-10-CM | POA: Insufficient documentation

## 2015-10-09 DIAGNOSIS — I253 Aneurysm of heart: Secondary | ICD-10-CM | POA: Diagnosis not present

## 2015-10-09 DIAGNOSIS — R0602 Shortness of breath: Secondary | ICD-10-CM | POA: Insufficient documentation

## 2015-10-09 NOTE — Telephone Encounter (Signed)
Spoke to patient. labs Result given . Verbalized understanding  

## 2015-10-09 NOTE — Telephone Encounter (Signed)
-----   Message from Tiffany Lee, MD sent at 10/09/2015 12:14 AM EST ----- Normal labs. 

## 2015-10-17 ENCOUNTER — Telehealth: Payer: Self-pay | Admitting: *Deleted

## 2015-10-17 NOTE — Telephone Encounter (Signed)
-----   Message from Debbie Si, MD sent at 10/16/2015  4:38 PM EST ----- Mild leaking of the mitral valve.  This may be the cause of her murmur.  We will follow over time but there is nothing to do now.

## 2015-10-17 NOTE — Telephone Encounter (Signed)
Spoke to patient.  ECHO Result given . Verbalized understanding  

## 2016-03-08 ENCOUNTER — Encounter (HOSPITAL_COMMUNITY): Payer: Self-pay | Admitting: Emergency Medicine

## 2016-03-08 ENCOUNTER — Emergency Department (HOSPITAL_COMMUNITY)
Admission: EM | Admit: 2016-03-08 | Discharge: 2016-03-09 | Disposition: A | Discharge status: Home / Self Care | Payer: Managed Care, Other (non HMO) | Attending: Emergency Medicine | Admitting: Emergency Medicine

## 2016-03-08 DIAGNOSIS — I10 Essential (primary) hypertension: Secondary | ICD-10-CM | POA: Diagnosis not present

## 2016-03-08 DIAGNOSIS — K219 Gastro-esophageal reflux disease without esophagitis: Secondary | ICD-10-CM | POA: Insufficient documentation

## 2016-03-08 DIAGNOSIS — E039 Hypothyroidism, unspecified: Secondary | ICD-10-CM | POA: Diagnosis not present

## 2016-03-08 DIAGNOSIS — Z79899 Other long term (current) drug therapy: Secondary | ICD-10-CM | POA: Diagnosis not present

## 2016-03-08 DIAGNOSIS — R0789 Other chest pain: Secondary | ICD-10-CM

## 2016-03-08 LAB — COMPREHENSIVE METABOLIC PANEL
ALK PHOS: 71 U/L (ref 38–126)
ALT: 23 U/L (ref 14–54)
ANION GAP: 7 (ref 5–15)
AST: 25 U/L (ref 15–41)
Albumin: 4.4 g/dL (ref 3.5–5.0)
BUN: 19 mg/dL (ref 6–20)
CALCIUM: 9.4 mg/dL (ref 8.9–10.3)
CO2: 28 mmol/L (ref 22–32)
Chloride: 104 mmol/L (ref 101–111)
Creatinine, Ser: 1.2 mg/dL — ABNORMAL HIGH (ref 0.44–1.00)
GFR, EST AFRICAN AMERICAN: 57 mL/min — AB (ref >60)
GFR, EST NON AFRICAN AMERICAN: 49 mL/min — AB (ref >60)
Glucose, Bld: 117 mg/dL — ABNORMAL HIGH (ref 65–99)
Potassium: 3.4 mmol/L — ABNORMAL LOW (ref 3.5–5.1)
SODIUM: 139 mmol/L (ref 135–145)
Total Bilirubin: 0.7 mg/dL (ref 0.3–1.2)
Total Protein: 7.5 g/dL (ref 6.5–8.1)

## 2016-03-08 LAB — URINALYSIS, ROUTINE W REFLEX MICROSCOPIC
Bilirubin Urine: NEGATIVE
Glucose, UA: NEGATIVE mg/dL
HGB URINE DIPSTICK: NEGATIVE
Ketones, ur: NEGATIVE mg/dL
LEUKOCYTES UA: NEGATIVE
NITRITE: NEGATIVE
PROTEIN: NEGATIVE mg/dL
SPECIFIC GRAVITY, URINE: 1.016 (ref 1.005–1.030)
pH: 5.5 (ref 5.0–8.0)

## 2016-03-08 LAB — CBC
HCT: 39.7 % (ref 36.0–46.0)
HEMOGLOBIN: 13.2 g/dL (ref 12.0–15.0)
MCH: 27.5 pg (ref 26.0–34.0)
MCHC: 33.2 g/dL (ref 30.0–36.0)
MCV: 82.7 fL (ref 78.0–100.0)
Platelets: 376 10*3/uL (ref 150–400)
RBC: 4.8 MIL/uL (ref 3.87–5.11)
RDW: 14 % (ref 11.5–15.5)
WBC: 7.6 10*3/uL (ref 4.0–10.5)

## 2016-03-08 LAB — LIPASE, BLOOD: LIPASE: 29 U/L (ref 11–51)

## 2016-03-08 NOTE — ED Notes (Addendum)
Pt c/o constant sharp LUQ pain x 1 day. Pain worsens with movement. Denies N/V/D and fever.

## 2016-03-09 ENCOUNTER — Other Ambulatory Visit: Payer: Self-pay

## 2016-03-09 ENCOUNTER — Emergency Department (HOSPITAL_COMMUNITY): Payer: Managed Care, Other (non HMO)

## 2016-03-09 ENCOUNTER — Encounter (HOSPITAL_COMMUNITY): Payer: Self-pay | Admitting: Emergency Medicine

## 2016-03-09 LAB — I-STAT TROPONIN, ED
TROPONIN I, POC: 0 ng/mL (ref 0.00–0.08)
Troponin i, poc: 0 ng/mL (ref 0.00–0.08)

## 2016-03-09 MED ORDER — NAPROXEN 500 MG PO TABS: 500.0000 mg | ORAL_TABLET | Freq: Two times a day (BID) | ORAL | Status: DC

## 2016-03-09 MED ORDER — KETOROLAC TROMETHAMINE 30 MG/ML IJ SOLN
30.0000 mg | Freq: Once | INTRAMUSCULAR | Status: AC
  Administered 2016-03-09: 30 mg via INTRAVENOUS
  Filled 2016-03-09: qty 1

## 2016-03-09 MED ORDER — METHOCARBAMOL 500 MG PO TABS: 500.0000 mg | ORAL_TABLET | Freq: Two times a day (BID) | ORAL | Status: DC

## 2016-03-09 MED ORDER — GI COCKTAIL ~~LOC~~
30.0000 mL | Freq: Once | ORAL | Status: AC
  Administered 2016-03-09: 30 mL via ORAL
  Filled 2016-03-09: qty 30

## 2016-03-09 MED ORDER — OMEPRAZOLE 20 MG PO CPDR: 20.0000 mg | DELAYED_RELEASE_CAPSULE | Freq: Every day | ORAL | Status: DC

## 2016-03-09 MED ORDER — DICYCLOMINE HCL 10 MG/ML IM SOLN
20.0000 mg | Freq: Once | INTRAMUSCULAR | Status: AC
  Administered 2016-03-09: 20 mg via INTRAMUSCULAR
  Filled 2016-03-09: qty 2

## 2016-03-09 MED ORDER — IOPAMIDOL (ISOVUE-370) INJECTION 76%
100.0000 mL | Freq: Once | INTRAVENOUS | Status: AC | PRN
  Administered 2016-03-09: 100 mL via INTRAVENOUS

## 2016-03-09 NOTE — ED Provider Notes (Signed)
CSN: [id number]     Arrival date & time 03/08/16  2137 History   By signing my name below, I, Debbie Brandt, attest that this documentation has been prepared under the direction and in the presence of Sheldon Sem, MD. Electronically Signed: Randell PatientMarrissa Brandt, ED Scribe. 03/09/2016. 2:36 AM.     Chief Complaint  Patient presents with  . Abdominal Pain    Patient is a 55 y.o. female presenting with chest pain. The history is provided by the patient. No language interpreter was used.  Chest Pain Pain location:  L chest Pain quality: sharp   Pain radiates to:  Does not radiate Pain radiates to the back: no   Pain severity:  Severe Onset quality:  Sudden Duration:  18 hours Timing:  Constant Progression:  Waxing and waning Chronicity:  New Context: not breathing, no drug use and no trauma   Relieved by:  Nothing Worsened by:  Nothing tried Ineffective treatments: ibuprofen. Associated symptoms: no abdominal pain, no back pain, no claudication, no cough, no dysphagia, no fever, no heartburn, no nausea, no orthopnea, no shortness of breath, no syncope and not vomiting    HPI Comments: Debbie Brandt is a 55 y.o. female with an hx of HTN, hypothyroidism, cesarean section, and abdominal hysterectomy who presents to the Emergency Department complaining of constant, waxing and waning, moderate, gradually worsening, sharp left-sided CP onset yesterday. Pt states that her CP began while she was at work and worsened upon her arrival home this evening when it wrapped around to her back. She reports that she has been belching but that this is baseline for her. CP is worse with movement. She has taken OTC pain medication with temporary relief. Denies recent heavy lifting and long-distance travel. Denies hx of DVT or PEs. Denies nausea, vomiting, diarrhea, leg swelling, SOB, constipation, diarrhea, or any other symptoms currently.  Past Medical History  Diagnosis Date  . Thyroid disease   .  Hypertension   . Murmur 09/26/2015  . Chest pain 09/26/2015  . Essential hypertension 09/26/2015  . Hypothyroidism 09/26/2015   Past Surgical History  Procedure Laterality Date  . Cesarean section    . Abdominal hysterectomy     Family History  Problem Relation Age of Onset  . Cancer Mother     lung cancer  . Hypertension Mother   . Cancer Father   . Heart attack Father   . Mental illness Sister   . Hypertension Sister   . Hyperlipidemia Sister   . Diabetes Brother   . Hypertension Brother    Social History  Substance Use Topics  . Smoking status: Never Smoker   . Smokeless tobacco: Never Used  . Alcohol Use: No   OB History    No data available     Review of Systems  Constitutional: Negative for fever.  HENT: Negative for trouble swallowing.   Respiratory: Negative for cough and shortness of breath.   Cardiovascular: Positive for chest pain. Negative for orthopnea, claudication, leg swelling and syncope.  Gastrointestinal: Negative for heartburn, nausea, vomiting, abdominal pain, diarrhea and constipation.  Musculoskeletal: Negative for back pain.  All other systems reviewed and are negative.     Allergies  Review of patient's allergies indicates no known allergies.  Home Medications   Prior to Admission medications   Medication Sig Start Date End Date Taking? Authorizing Provider  Calcium Carbonate-Vitamin D 600-400 MG-UNIT per tablet Take 1 tablet by mouth daily.   Yes Historical Provider, MD  levothyroxine (SYNTHROID,  LEVOTHROID) 75 MCG tablet Take 75 mcg by mouth daily before breakfast.   Yes Historical Provider, MD  MULTIPLE VITAMIN PO Take 1 tablet by mouth daily.   Yes Historical Provider, MD  TRAVATAN Z 0.004 % SOLN ophthalmic solution Place 1 drop into the right eye daily. 12/10/15  Yes Historical Provider, MD  triamterene-hydrochlorothiazide (MAXZIDE-25) 37.5-25 MG per tablet Take 1 tablet by mouth daily.   Yes Historical Provider, MD  ibuprofen  (ADVIL,MOTRIN) 800 MG tablet Take 1 tablet (800 mg total) by mouth every 8 (eight) hours as needed. Patient not taking: Reported on 03/08/2016 11/27/14   Thao P Le, DO   BP 111/81 mmHg  Pulse 64  Temp(Src) 98.4 F (36.9 C) (Oral)  Resp 16  SpO2 100% Physical Exam  Constitutional: She is oriented to person, place, and time. She appears well-developed and well-nourished. No distress.  HENT:  Head: Normocephalic and atraumatic.  Mouth/Throat: Oropharynx is clear and moist and mucous membranes are normal. No oropharyngeal exudate.  Moist mucus membranes. Oropharynx clear without exudate.  Eyes: EOM are normal. Pupils are equal, round, and reactive to light.  Neck: Normal range of motion. Carotid bruit is not present. No tracheal deviation present.  No stridor or bruit. Trachea midline.  Cardiovascular: Normal rate and regular rhythm.   Regular rate and rhythm.  Pulmonary/Chest: Effort normal and breath sounds normal. No stridor. No respiratory distress. She has no wheezes. She has no rales. She exhibits tenderness.  Lungs CTA bilaterally.  Abdominal: Soft. Bowel sounds are normal. She exhibits no distension. There is no tenderness. There is no rebound and no guarding.  Musculoskeletal: Normal range of motion. She exhibits no edema.  No leg swelling. All compartments are soft.  Neurological: She is alert and oriented to person, place, and time. She has normal reflexes.  DTRs intact.  Skin: Skin is warm and dry. She is not diaphoretic.  Psychiatric: She has a normal mood and affect. Her behavior is normal.  Nursing note and vitals reviewed.   ED Course  Procedures (including critical care time)  DIAGNOSTIC STUDIES: Oxygen Saturation is 100% on RA, normal by my interpretation.    COORDINATION OF CARE: 12:36 AM Will order labs, GI cocktail, Bentyl, Toradol, and chest x-ray. Discussed treatment plan with pt at bedside and pt agreed to plan.  Labs Review Labs Reviewed  COMPREHENSIVE  METABOLIC PANEL - Abnormal; Notable for the following:    Potassium 3.4 (*)    Glucose, Bld 117 (*)    Creatinine, Ser 1.20 (*)    GFR calc non Af Amer 49 (*)    GFR calc Af Amer 57 (*)    All other components within normal limits  LIPASE, BLOOD  CBC  URINALYSIS, ROUTINE W REFLEX MICROSCOPIC (NOT AT [facility])  I-STAT TROPOININ, ED  I-STAT TROPOININ, ED    Imaging Review Dg Abd Acute W/chest  03/09/2016  CLINICAL DATA:  Sharp LEFT abdominal pain radiating to LEFT chest and back. History of hypertension. EXAM: DG ABDOMEN ACUTE W/ 1V CHEST COMPARISON:  Abdominal radiograph Feb 06, 2011 FINDINGS: Cardiomediastinal silhouette is normal. Lungs are clear, no pleural effusions. No pneumothorax. Soft tissue planes and included osseous structures are unremarkable. Bowel gas pattern is nondilated and nonobstructive. No intra-abdominal mass effect, pathologic calcifications or free air. Soft tissue planes and included osseous structures are non-suspicious. Phleboliths in the pelvis. IMPRESSION: Normal chest. Normal bowel gas pattern. Electronically Signed   By: Awilda Metro M.D.   On: 03/09/2016 01:45   I  have personally reviewed and evaluated these images and lab results as part of my medical decision-making.   EKG Interpretation   Date/Time:  Saturday March 09 2016 01:47:44 EDT Ventricular Rate:  82 PR Interval:    QRS Duration: 93 QT Interval:  395 QTC Calculation: 462 R Axis:   50 Text Interpretation:  Sinus rhythm Confirmed by [facility]  MD, Kinzleigh Kandler  ([id number]) on 03/09/2016 2:01:55 AM      MDM   Final diagnoses:  None   Filed Vitals:   03/08/16 2144 03/09/16 0214  BP: 185/83 111/81  Pulse: 82 64  Temp: 98.2 F (36.8 C) 98.4 F (36.9 C)  Resp: 16 16   Results for orders placed or performed during the hospital encounter of 03/08/16  Lipase, blood  Result Value Ref Range   Lipase 29 11 - 51 U/L  Comprehensive metabolic panel  Result Value Ref Range   Sodium 139 135 - 145  mmol/L   Potassium 3.4 (L) 3.5 - 5.1 mmol/L   Chloride 104 101 - 111 mmol/L   CO2 28 22 - 32 mmol/L   Glucose, Bld 117 (H) 65 - 99 mg/dL   BUN 19 6 - 20 mg/dL   Creatinine, Ser 1.911.20 (H) 0.44 - 1.00 mg/dL   Calcium 9.4 8.9 - 47.810.3 mg/dL   Total Protein 7.5 6.5 - 8.1 g/dL   Albumin 4.4 3.5 - 5.0 g/dL   AST 25 15 - 41 U/L   ALT 23 14 - 54 U/L   Alkaline Phosphatase 71 38 - 126 U/L   Total Bilirubin 0.7 0.3 - 1.2 mg/dL   GFR calc non Af Amer 49 (L) >60 mL/min   GFR calc Af Amer 57 (L) >60 mL/min   Anion gap 7 5 - 15  CBC  Result Value Ref Range   WBC 7.6 4.0 - 10.5 K/uL   RBC 4.80 3.87 - 5.11 MIL/uL   Hemoglobin 13.2 12.0 - 15.0 g/dL   HCT 29.539.7 62.136.0 - 30.846.0 %   MCV 82.7 78.0 - 100.0 fL   MCH 27.5 26.0 - 34.0 pg   MCHC 33.2 30.0 - 36.0 g/dL   RDW 65.714.0 84.611.5 - 96.215.5 %   Platelets 376 150 - 400 K/uL  Urinalysis, Routine w reflex microscopic  Result Value Ref Range   Color, Urine YELLOW YELLOW   APPearance CLEAR CLEAR   Specific Gravity, Urine 1.016 1.005 - 1.030   pH 5.5 5.0 - 8.0   Glucose, UA NEGATIVE NEGATIVE mg/dL   Hgb urine dipstick NEGATIVE NEGATIVE   Bilirubin Urine NEGATIVE NEGATIVE   Ketones, ur NEGATIVE NEGATIVE mg/dL   Protein, ur NEGATIVE NEGATIVE mg/dL   Nitrite NEGATIVE NEGATIVE   Leukocytes, UA NEGATIVE NEGATIVE  I-stat troponin, ED  Result Value Ref Range   Troponin i, poc 0.00 0.00 - 0.08 ng/mL   Comment 3          I-stat troponin, ED  Result Value Ref Range   Troponin i, poc 0.00 0.00 - 0.08 ng/mL   Comment 3           Ct Angio Chest Pe W/cm &/or Wo Cm  03/09/2016  CLINICAL DATA:  55 year old female with left upper quadrant abdominal pain EXAM: CT ANGIOGRAPHY CHEST WITH CONTRAST TECHNIQUE: Multidetector CT imaging of the chest was performed using the standard protocol during bolus administration of intravenous contrast. Multiplanar CT image reconstructions and MIPs were obtained to evaluate the vascular anatomy. CONTRAST:  100 cc Isovue 370 COMPARISON:   Radiograph  dated 03/09/2016 FINDINGS: Minimal bibasilar linear atelectasis/ scarring. The lungs are clear. There is no pleural effusion or pneumothorax. The central airways are patent. The thoracic aorta appears unremarkable. The origins of the great vessels of the aortic arch appear patent. There is no CT evidence of pulmonary embolism. No cardiomegaly or pericardial effusion. There is no hilar or mediastinal adenopathy. Esophagus is grossly unremarkable. No thyroid nodules identified. There is no axillary adenopathy. The chest wall soft tissues appear unremarkable. There is mild degenerative changes of the spine. No acute fracture. A 2 cm left renal upper pole hypodense lesion, likely a cyst. Review of the MIP images confirms the above findings. IMPRESSION: No acute intrathoracic pathology. No CT evidence of pulmonary embolism. Electronically Signed   By: Elgie Collard M.D.   On: 03/09/2016 04:03   Dg Abd Acute W/chest  03/09/2016  CLINICAL DATA:  Lambert Mody LEFT abdominal pain radiating to LEFT chest and back. History of hypertension. EXAM: DG ABDOMEN ACUTE W/ 1V CHEST COMPARISON:  Abdominal radiograph Feb 06, 2011 FINDINGS: Cardiomediastinal silhouette is normal. Lungs are clear, no pleural effusions. No pneumothorax. Soft tissue planes and included osseous structures are unremarkable. Bowel gas pattern is nondilated and nonobstructive. No intra-abdominal mass effect, pathologic calcifications or free air. Soft tissue planes and included osseous structures are non-suspicious. Phleboliths in the pelvis. IMPRESSION: Normal chest. Normal bowel gas pattern. Electronically Signed   By: Awilda Metro M.D.   On: 03/09/2016 01:45     Medications  gi cocktail (Maalox,Lidocaine,Donnatal) (30 mLs Oral Given 03/09/16 0214)  dicyclomine (BENTYL) injection 20 mg (20 mg Intramuscular Given 03/09/16 0209)  ketorolac (TORADOL) 30 MG/ML injection 30 mg (30 mg Intravenous Given 03/09/16 0204)  iopamidol (ISOVUE-370) 76 %  injection 100 mL (100 mLs Intravenous Contrast Given 03/09/16 0341)   Pain free post medication.   The burping makes is consistent with GERD but the pain was highly reproducible, consistent with muscle pain.  Will start prilosec for the burping and NSAIDs/ robaxin for the chest wall pain.  Ruled out for MI and PE in the ED, pain is highly atypical for cardiac pain.  Heart score is 1 and the patient is stable for discharge.  Follow up with your PMD strict return precautions given   I personally performed the services described in this documentation, which was scribed in my presence. The recorded information has been reviewed and is accurate.      Cy Blamer, MD 03/09/16 [id number]

## 2016-03-09 NOTE — ED Notes (Signed)
EKG given to EDP,Palumbo,MD., for review. 

## 2016-03-09 NOTE — ED Notes (Signed)
Pt in X ray

## 2016-04-03 ENCOUNTER — Encounter: Payer: Self-pay | Admitting: Cardiovascular Disease

## 2016-04-03 ENCOUNTER — Ambulatory Visit (INDEPENDENT_AMBULATORY_CARE_PROVIDER_SITE_OTHER): Payer: Managed Care, Other (non HMO) | Admitting: Cardiovascular Disease

## 2016-04-03 VITALS — BP 145/75 | HR 69 | Ht 62.0 in | Wt 161.8 lb

## 2016-04-03 DIAGNOSIS — I10 Essential (primary) hypertension: Secondary | ICD-10-CM | POA: Diagnosis not present

## 2016-04-03 DIAGNOSIS — R002 Palpitations: Secondary | ICD-10-CM | POA: Diagnosis not present

## 2016-04-03 DIAGNOSIS — R0789 Other chest pain: Secondary | ICD-10-CM | POA: Diagnosis not present

## 2016-04-03 NOTE — Patient Instructions (Signed)
Your physician recommends that you schedule a follow-up appointment in: as needed with Dr. Duke Salviaandolph  Call in 1 week if Blood Pressure exceeds 140/90.   If you need a refill on your cardiac medications before your next appointment, please call your pharmacy.

## 2016-04-03 NOTE — Progress Notes (Signed)
Cardiology Office Note   Date:  04/03/2016   ID:  Debbie Brandt, DOB 07/06/1961, MRN [id number]  PCP:  Debbie Bussing, MD  Cardiologist:   Debbie Si, MD   Chief Complaint  Patient presents with  . Follow-up    pt denied chest pain and SOB      History of Present Illness: Debbie Brandt is a 55 y.o. female with hypertension and hypothyroidism who presents for follow up on palpitations.  Debbie Brandt was first seen 09/2015 with palpitations that began approximately one year prior. It occurred after losing her son.  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  The symptoms improved after starting counseling and all laboratory testing was unremarkable, so she did not wear a monitor at that time. She was noted to have a murmur on exam.  Echo 10/09/15 revealed LVEF 60-65% with normal diastolic function. There was mild TR and MR.  She also reported some atypical chest pain so she was referred for an exercise Myoview 10/05/15 that revealed no ischemia. Her blood pressure was poorly-controlled but she had excellent exercise capacity (10.1 METS).    Since her last appointment Debbie Brandt has been doing well.  She notes that her stress levels have improved.  She continues to have occasional pain in her stomach, back and chest.  However, she has noticed that it improves with belching.  There is no associated shortness of breath, diaphoresis, or nausea.  She denies lower extremity edema, orthopnea or PND.  She has not been exercising lately because of the hot weather.     Past Medical History  Diagnosis Date  . Thyroid disease   . Hypertension   . Murmur 09/26/2015  . Chest pain 09/26/2015  . Essential hypertension 09/26/2015  . Hypothyroidism 09/26/2015    Past Surgical History  Procedure Laterality Date  . Cesarean section    . Abdominal hysterectomy       Current Outpatient Prescriptions  Medication Sig Dispense Refill  . Calcium  Carbonate-Vitamin D 600-400 MG-UNIT per tablet Take 1 tablet by mouth daily.    Marland Kitchen ibuprofen (ADVIL,MOTRIN) 800 MG tablet Take 1 tablet (800 mg total) by mouth every 8 (eight) hours as needed. 30 tablet 0  . levothyroxine (SYNTHROID, LEVOTHROID) 75 MCG tablet Take 75 mcg by mouth daily before breakfast.    . methocarbamol (ROBAXIN) 500 MG tablet Take 1 tablet (500 mg total) by mouth 2 (two) times daily. 20 tablet 0  . MULTIPLE VITAMIN PO Take 1 tablet by mouth daily.    . naproxen (NAPROSYN) 500 MG tablet Take 1 tablet (500 mg total) by mouth 2 (two) times daily. 30 tablet 0  . omeprazole (PRILOSEC) 20 MG capsule Take 1 capsule (20 mg total) by mouth daily. 30 capsule 0  . TRAVATAN Z 0.004 % SOLN ophthalmic solution Place 1 drop into the right eye daily.    Marland Kitchen triamterene-hydrochlorothiazide (MAXZIDE-25) 37.5-25 MG per tablet Take 1 tablet by mouth daily.     No current facility-administered medications for this visit.    Allergies:   Review of patient's allergies indicates no known allergies.    Social History:  The patient  reports that she has never smoked. She has never used smokeless tobacco. She reports that she does not drink alcohol or use illicit drugs.   Family History:  The patient's family history includes Cancer in her father and mother; Diabetes in her brother; Heart attack in her  father; Hyperlipidemia in her sister; Hypertension in her brother, mother, and sister; Mental illness in her sister.    ROS:  Please see the history of present illness.   Otherwise, review of systems are positive for none.   All other systems are reviewed and negative.    PHYSICAL EXAM: VS:  BP 145/75 mmHg  Pulse 69  Ht 5\' 2"  (1.575 m)  Wt 161 lb 12.8 oz (73.392 kg)  BMI 29.59 kg/m2 , BMI Body mass index is 29.59 kg/(m^2). GENERAL:  Well appearing HEENT:  Pupils equal round and reactive, fundi not visualized, oral mucosa unremarkable NECK:  No jugular venous distention, waveform within normal  limits, carotid upstroke brisk and symmetric, no bruits, no thyromegaly LYMPHATICS:  No cervical adenopathy LUNGS:  Clear to auscultation bilaterally HEART:  RRR.  PMI not displaced or sustained,S1 and S2 within normal limits, no S3, no S4, no clicks, no rubs, II/VI sysotlic murmur at the LUSB. ABD:  Flat, positive bowel sounds normal in frequency in pitch, no bruits, no rebound, no guarding, no midline pulsatile mass, no hepatomegaly, no splenomegaly EXT:  2 plus pulses throughout, no edema, no cyanosis no clubbing SKIN:  No rashes no nodules NEURO:  Cranial nerves II through XII grossly intact, motor grossly intact [facility]  Cognitively intact, oriented to person place and time   EKG:  EKG is 09/26/15 ordered today. The ekg ordered today demonstrates sinus rhythm. Rate 71 bpm.  Echo 10/09/15: Study Conclusions  - Left ventricle: The cavity size was normal. Wall thickness was  normal. Systolic function was normal. The estimated ejection  fraction was in the range of 60% to 65%. Wall motion was normal;  there were no regional wall motion abnormalities. Left  ventricular diastolic function parameters were normal. - Mitral valve: Mildly thickened leaflets . There was mild  regurgitation. - Left atrium: The atrium was normal in size. - Right atrium: The atrium was normal in size. 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 - Tricuspid valve: There was mild regurgitation. - Pulmonary arteries: PA peak pressure: 24 mm Hg (S). - Inferior vena cava: The vessel was normal in size. The  respirophasic diameter changes were in the normal range (= 50%),  consistent with normal central venous pressure.  Impressions:  - LVEF 60-65%, normal wall thickness and wall motion, normal  diastolic function, normal biatrial size, mild MVL thickening  with mild regurgitation, mild tricuspid regurgitation with normal  RVSP, aneurysmal IAS - cannot exclude  PFO.  Exercise Myoview 10/05/15:  The left ventricular ejection fraction is hyperdynamic (>65%).  Nuclear stress EF: 67%.  Blood pressure demonstrated a blunted response to exercise.  ST segment depression was noted during stress.  The study is normal.  Normal stress nuclear study with no ischemia or infarction; EF 67 with normal wall motion.  Recent Labs: 09/26/2015: Magnesium 1.9; TSH 3.262 03/08/2016: ALT 23; BUN 19; Creatinine, Ser 1.20*; Hemoglobin 13.2; Platelets 376; Potassium 3.4*; Sodium 139    Lipid Panel No results found for: CHOL, TRIG, HDL, CHOLHDL, VLDL, LDLCALC, LDLDIRECT    Wt Readings from Last 3 Encounters:  04/03/16 161 lb 12.8 oz (73.392 kg)  10/05/15 167 lb (75.751 kg)  09/26/15 167 lb 8 oz (75.978 kg)      ASSESSMENT AND PLAN:  # Palpitations: Resolved with reduced stress.  # Murmur: No echocardiographic reason for her murmur.  This is just a systolic ejection murmur.  # Chest pain: Resolved.  Stress was negative for ischemia.  #  Hypertension: BP slightly above goal and was high with her stress as well.  She will check her BP at home over the next 1-2 weeks.  If >140/90 we will add amlodipine 2.5 mg daily  # CV Disease prevention: Ms. Brogden reports that her lipids are checked regularly by her PCP.  We discussed the importance of getting 30-40 minutes of exercise most days of the week.  SHe was encouraged to start walking in the early am or at night due to the extreme heat.  Current medicines are reviewed at length with the patient today.  The patient does not have concerns regarding medicines.  The following changes have been made:  none  Labs/ tests ordered today include:     No orders of the defined types were placed in this encounter.     Disposition:   FU with Salem Mastrogiovanni C. Duke Salvia, MD, [facility] as needed   This note was written with the assistance of speech recognition software.  Please excuse any transcriptional  errors.  Signed, Simcha Speir C. Duke Salvia, MD, [facility]  04/03/2016 12:45 PM    [facility]

## 2016-10-22 ENCOUNTER — Emergency Department (HOSPITAL_COMMUNITY): Payer: Managed Care, Other (non HMO)

## 2016-10-22 ENCOUNTER — Encounter (HOSPITAL_COMMUNITY): Payer: Self-pay | Admitting: *Deleted

## 2016-10-22 ENCOUNTER — Emergency Department (HOSPITAL_COMMUNITY)
Admission: EM | Admit: 2016-10-22 | Discharge: 2016-10-22 | Disposition: A | Discharge status: Home / Self Care | Payer: Managed Care, Other (non HMO) | Source: Home / Self Care | Attending: Emergency Medicine | Admitting: Emergency Medicine

## 2016-10-22 DIAGNOSIS — R1030 Lower abdominal pain, unspecified: Secondary | ICD-10-CM

## 2016-10-22 DIAGNOSIS — E039 Hypothyroidism, unspecified: Secondary | ICD-10-CM | POA: Insufficient documentation

## 2016-10-22 DIAGNOSIS — I10 Essential (primary) hypertension: Secondary | ICD-10-CM

## 2016-10-22 DIAGNOSIS — Z79899 Other long term (current) drug therapy: Secondary | ICD-10-CM

## 2016-10-22 HISTORY — DX: Gastro-esophageal reflux disease without esophagitis: K21.9

## 2016-10-22 LAB — CBC WITH DIFFERENTIAL/PLATELET
Basophils Absolute: 0 10*3/uL (ref 0.0–0.1)
Basophils Relative: 0 %
EOS PCT: 0 %
Eosinophils Absolute: 0 10*3/uL (ref 0.0–0.7)
HCT: 36.3 % (ref 36.0–46.0)
Hemoglobin: 11.8 g/dL — ABNORMAL LOW (ref 12.0–15.0)
LYMPHS ABS: 0.5 10*3/uL — AB (ref 0.7–4.0)
Lymphocytes Relative: 8 %
MCH: 26.9 pg (ref 26.0–34.0)
MCHC: 32.5 g/dL (ref 30.0–36.0)
MCV: 82.9 fL (ref 78.0–100.0)
MONO ABS: 0.2 10*3/uL (ref 0.1–1.0)
MONOS PCT: 3 %
Neutro Abs: 5.8 10*3/uL (ref 1.7–7.7)
Neutrophils Relative %: 89 %
PLATELETS: 377 10*3/uL (ref 150–400)
RBC: 4.38 MIL/uL (ref 3.87–5.11)
RDW: 13.8 % (ref 11.5–15.5)
WBC: 6.5 10*3/uL (ref 4.0–10.5)

## 2016-10-22 LAB — COMPREHENSIVE METABOLIC PANEL
ALK PHOS: 69 U/L (ref 38–126)
ALT: 20 U/L (ref 14–54)
AST: 22 U/L (ref 15–41)
Albumin: 4.1 g/dL (ref 3.5–5.0)
Anion gap: 8 (ref 5–15)
BUN: 19 mg/dL (ref 6–20)
CALCIUM: 9.2 mg/dL (ref 8.9–10.3)
CHLORIDE: 105 mmol/L (ref 101–111)
CO2: 27 mmol/L (ref 22–32)
CREATININE: 0.85 mg/dL (ref 0.44–1.00)
GFR calc Af Amer: >60 mL/min (ref >60)
GFR calc non Af Amer: >60 mL/min (ref >60)
GLUCOSE: 127 mg/dL — AB (ref 65–99)
Potassium: 3.6 mmol/L (ref 3.5–5.1)
SODIUM: 140 mmol/L (ref 135–145)
Total Bilirubin: 0.4 mg/dL (ref 0.3–1.2)
Total Protein: 7.1 g/dL (ref 6.5–8.1)

## 2016-10-22 LAB — URINALYSIS, ROUTINE W REFLEX MICROSCOPIC
Bilirubin Urine: NEGATIVE
Glucose, UA: NEGATIVE mg/dL
Hgb urine dipstick: NEGATIVE
Ketones, ur: NEGATIVE mg/dL
LEUKOCYTES UA: NEGATIVE
Nitrite: NEGATIVE
PROTEIN: NEGATIVE mg/dL
SPECIFIC GRAVITY, URINE: 1.017 (ref 1.005–1.030)
pH: 8 (ref 5.0–8.0)

## 2016-10-22 LAB — LIPASE, BLOOD: Lipase: 26 U/L (ref 11–51)

## 2016-10-22 LAB — I-STAT CG4 LACTIC ACID, ED: Lactic Acid, Venous: 1.62 mmol/L (ref 0.5–1.9)

## 2016-10-22 MED ORDER — MORPHINE SULFATE (PF) 4 MG/ML IV SOLN
4.0000 mg | Freq: Once | INTRAVENOUS | Status: AC
  Administered 2016-10-22: 4 mg via INTRAVENOUS
  Filled 2016-10-22: qty 1

## 2016-10-22 MED ORDER — IOPAMIDOL (ISOVUE-300) INJECTION 61%
100.0000 mL | Freq: Once | INTRAVENOUS | Status: AC | PRN
  Administered 2016-10-22: 100 mL via INTRAVENOUS

## 2016-10-22 MED ORDER — ONDANSETRON HCL 4 MG/2ML IJ SOLN
4.0000 mg | Freq: Once | INTRAMUSCULAR | Status: AC
  Administered 2016-10-22: 4 mg via INTRAVENOUS
  Filled 2016-10-22: qty 2

## 2016-10-22 MED ORDER — SODIUM CHLORIDE 0.9 % IV BOLUS (SEPSIS)
1000.0000 mL | Freq: Once | INTRAVENOUS | Status: AC
  Administered 2016-10-22: 1000 mL via INTRAVENOUS

## 2016-10-22 NOTE — ED Triage Notes (Signed)
Pt states that she began to have lower abd / pelvic pain during the night; pt states that the pain is sharp and radiates through to her rectum; pt c/o rectal pressure with the urge to have a BM; pt states that she had a small BM this morning and that it was painful to pass; pt denies N/V

## 2016-10-22 NOTE — ED Provider Notes (Signed)
[facility]-EMERGENCY DEPT Provider Note   CSN: [id number] Arrival date & time: 10/22/16 0551     History    Chief Complaint  Patient presents with  . Abdominal Pain     HPI Debbie Brandt is a 56 y.o. female.  56yo F w/ PMH including HTN, GERD, hypothyroidism who p/w abdominal pain. Overnight last night, the patient had a gradually worsening onset of lower abdominal pain that has become severe and constant. The pain is sharp and sometimes radiates to her rectum. She reports some rectal pressure with the urge to have a bowel movement. She did have a small bowel movement this morning that was normal, nonbloody. She states that the pain began prior to having bowel movement. No nausea, vomiting, diarrhea, fevers, or recent illness. No dysuria or hematuria, no vaginal bleeding or discharge. She has never had this pain before. She took some gas relief medication which did not improve her symptoms.   Past Medical History:  Diagnosis Date  . Chest pain 09/26/2015  . Essential hypertension 09/26/2015  . GERD (gastroesophageal reflux disease)   . Hypertension   . Hypothyroidism 09/26/2015  . Murmur 09/26/2015  . Thyroid disease      Patient Active Problem List   Diagnosis Date Noted  . Murmur 09/26/2015  . Chest pain 09/26/2015  . Essential hypertension 09/26/2015  . Hypothyroidism 09/26/2015    Past Surgical History:  Procedure Laterality Date  . ABDOMINAL HYSTERECTOMY    . CESAREAN SECTION      OB History    No data available        Home Medications    Prior to Admission medications   Medication Sig Start Date End Date Taking? Authorizing Provider  Calcium Carbonate-Vitamin D 600-400 MG-UNIT per tablet Take 1 tablet by mouth daily.   Yes Historical Provider, MD  levothyroxine (SYNTHROID, LEVOTHROID) 75 MCG tablet Take 75 mcg by mouth daily before breakfast.   Yes Historical Provider, MD  MULTIPLE VITAMIN PO Take 1 tablet by mouth daily.   Yes Historical Provider, MD    omeprazole (PRILOSEC) 20 MG capsule Take 1 capsule (20 mg total) by mouth daily. 03/09/16  Yes April Palumbo, MD  TRAVATAN Z 0.004 % SOLN ophthalmic solution Place 1 drop into the right eye daily. 12/10/15  Yes Historical Provider, MD  triamterene-hydrochlorothiazide (MAXZIDE-25) 37.5-25 MG per tablet Take 1 tablet by mouth daily.   Yes Historical Provider, MD  ibuprofen (ADVIL,MOTRIN) 800 MG tablet Take 1 tablet (800 mg total) by mouth every 8 (eight) hours as needed. Patient not taking: Reported on 10/22/2016 11/27/14   Thao P Le, DO  methocarbamol (ROBAXIN) 500 MG tablet Take 1 tablet (500 mg total) by mouth 2 (two) times daily. Patient not taking: Reported on 10/22/2016 03/09/16   April Palumbo, MD  naproxen (NAPROSYN) 500 MG tablet Take 1 tablet (500 mg total) by mouth 2 (two) times daily. Patient not taking: Reported on 10/22/2016 03/09/16   April Palumbo, MD      Family History  Problem Relation Age of Onset  . Cancer Mother     lung cancer  . Hypertension Mother   . Cancer Father   . Heart attack Father   . Mental illness Sister   . Hypertension Sister   . Hyperlipidemia Sister   . Diabetes Brother   . Hypertension Brother      Social History  Substance Use Topics  . Smoking status: Never Smoker  . Smokeless tobacco: Never Used  . Alcohol use No  Allergies     Patient has no known allergies.    Review of Systems  10 Systems reviewed and are negative for acute change except as noted in the HPI.   Physical Exam Updated Vital Signs BP 126/62 (BP Location: Left Arm)   Pulse 75   Temp 97.6 F (36.4 C)   Resp 16   SpO2 100%   Physical Exam  Constitutional: She is oriented to person, place, and time. She appears well-developed and well-nourished. She appears distressed.  Moaning in pain, holding lower belly  HENT:  Head: Normocephalic and atraumatic.  dry mucous membranes  Eyes: Conjunctivae are normal. Pupils are equal, round, and reactive to light.  Neck:  Neck supple.  Cardiovascular: Normal rate, regular rhythm and normal heart sounds.   No murmur heard. Pulmonary/Chest: Effort normal and breath sounds normal.  Abdominal: Soft. Bowel sounds are normal. She exhibits no distension. There is tenderness. There is no rebound and no guarding.  RLQ and suprapubic abd tenderness  Musculoskeletal: She exhibits no edema.  Neurological: She is alert and oriented to person, place, and time. She exhibits normal muscle tone.  Fluent speech  Skin: Skin is warm and dry.  Psychiatric: Judgment normal.  distressed  Nursing note and vitals reviewed.     ED Treatments / Results  Labs (all labs ordered are listed, but only abnormal results are displayed) Labs Reviewed  COMPREHENSIVE METABOLIC PANEL - Abnormal; Notable for the following:       Result Value   Glucose, Bld 127 (*)    All other components within normal limits  CBC WITH DIFFERENTIAL/PLATELET - Abnormal; Notable for the following:    Hemoglobin 11.8 (*)    Lymphs Abs 0.5 (*)    All other components within normal limits  URINALYSIS, ROUTINE W REFLEX MICROSCOPIC  LIPASE, BLOOD  I-STAT CG4 LACTIC ACID, ED     EKG  EKG Interpretation  Date/Time:    Ventricular Rate:    PR Interval:    QRS Duration:   QT Interval:    QTC Calculation:   R Axis:     Text Interpretation:           Radiology Ct Abdomen Pelvis W Contrast  Result Date: 10/22/2016 CLINICAL DATA:  Lower abdominal and pelvic pain beginning last night. EXAM: CT ABDOMEN AND PELVIS WITH CONTRAST TECHNIQUE: Multidetector CT imaging of the abdomen and pelvis was performed using the standard protocol following bolus administration of intravenous contrast. CONTRAST:  100 ml ISOVUE-300 IOPAMIDOL (ISOVUE-300) INJECTION 61% COMPARISON:  Plain films chest and abdomen 03/09/2016. FINDINGS: Lower chest: Mild dependent atelectasis is seen in the lung bases. No pleural or pericardial effusion. Hepatobiliary: No focal liver abnormality  is seen. No gallstones, gallbladder wall thickening, or biliary dilatation. Pancreas: Unremarkable. No pancreatic ductal dilatation or surrounding inflammatory changes. Spleen: Normal in size without focal abnormality. Adrenals/Urinary Tract: The adrenal glands appear normal. 2.1 cm simple left renal cyst is identified. Kidneys otherwise appear normal. Ureters and urinary bladder appear normal. Stomach/Bowel: Stomach is within normal limits. Appendix appears normal. No evidence of bowel wall thickening, distention, or inflammatory changes. Vascular/Lymphatic: No significant vascular findings are present. No enlarged abdominal or pelvic lymph nodes. Reproductive: Status post hysterectomy. No adnexal masses. Other: No abdominal wall hernia or abnormality. No abdominopelvic ascites. Musculoskeletal: Loss of disc space height and endplate sclerosis are seen at L5-S1. Small bone island left ilium noted. No worrisome bone lesion. IMPRESSION: No acute abnormality abdomen or pelvis. Degenerative disc disease L5-S1. Status post  hysterectomy. Electronically Signed   By: Drusilla Kanner M.D.   On: 10/22/2016 11:20    Procedures Procedures (including critical care time) Procedures  Medications Ordered in ED  Medications  sodium chloride 0.9 % bolus 1,000 mL (0 mLs Intravenous Stopped 10/22/16 0911)  morphine 4 MG/ML injection 4 mg (4 mg Intravenous Given 10/22/16 0744)  ondansetron (ZOFRAN) injection 4 mg (4 mg Intravenous Given 10/22/16 0743)  morphine 4 MG/ML injection 4 mg (4 mg Intravenous Given 10/22/16 1003)  iopamidol (ISOVUE-300) 61 % injection 100 mL (100 mLs Intravenous Contrast Given 10/22/16 1056)     Initial Impression / Assessment and Plan / ED Course  I have reviewed the triage vital signs and the nursing notes.  Pertinent labs & imaging results that were available during my care of the patient were reviewed by me and considered in my medical decision making (see chart for details).     PT w/  severe lower abdominal pain since last night, no associated vomiting or diarrhea. She was in mild distress due to pain, holding her lower abdomen. Vital signs stable. She had right lower quadrant and suprapubic abdominal tenderness without rebound or guarding. No abdominal distention or peritonitis. Obtained above lab work and gave the patient morphine and Zofran as well as IV fluid bolus.  All labwork was unremarkable including normal LFTs, lipase, and WBC count. Obtained CT because of the persistence of the patient's pain as well as right lower quadrant pain to rule out obstruction or appendicitis. CT was negative for any acute findings. Patient was comfortable on reexamination was regaining Sprite. Based on normal lab work, normal vital signs, and unremarkable CT I feel she is safe for discharge. She has no large ovarian masses to suggest ovarian pathology as the source of her pain and given that she is not of reproductive age I do not feel she needs any pelvic ultrasound at this time. I've instructed her to follow-up with PCP in a few days and I extensively reviewed return precautions with her. She has voiced understanding and patient discharged in satisfactory condition. Final Clinical Impressions(s) / ED Diagnoses   Final diagnoses:  Lower abdominal pain     Discharge Medication List as of 10/22/2016 12:41 PM         Laurence Spates, MD 10/22/16 1505

## 2016-10-22 NOTE — ED Notes (Signed)
Pt tolerating po fluids

## 2016-10-23 ENCOUNTER — Encounter (HOSPITAL_COMMUNITY): Payer: Self-pay | Admitting: Emergency Medicine

## 2016-10-23 ENCOUNTER — Emergency Department (HOSPITAL_COMMUNITY): Payer: Managed Care, Other (non HMO)

## 2016-10-23 ENCOUNTER — Inpatient Hospital Stay (HOSPITAL_COMMUNITY)
Admission: EM | Admit: 2016-10-23 | Discharge: 2016-11-04 | DRG: 336 | Disposition: A | Discharge status: Home / Self Care | Payer: Managed Care, Other (non HMO) | Attending: General Surgery | Admitting: General Surgery

## 2016-10-23 DIAGNOSIS — Z818 Family history of other mental and behavioral disorders: Secondary | ICD-10-CM

## 2016-10-23 DIAGNOSIS — E039 Hypothyroidism, unspecified: Secondary | ICD-10-CM | POA: Diagnosis present

## 2016-10-23 DIAGNOSIS — R112 Nausea with vomiting, unspecified: Secondary | ICD-10-CM | POA: Diagnosis present

## 2016-10-23 DIAGNOSIS — R188 Other ascites: Secondary | ICD-10-CM | POA: Diagnosis present

## 2016-10-23 DIAGNOSIS — Z833 Family history of diabetes mellitus: Secondary | ICD-10-CM | POA: Diagnosis not present

## 2016-10-23 DIAGNOSIS — K56609 Unspecified intestinal obstruction, unspecified as to partial versus complete obstruction: Secondary | ICD-10-CM | POA: Diagnosis present

## 2016-10-23 DIAGNOSIS — K219 Gastro-esophageal reflux disease without esophagitis: Secondary | ICD-10-CM | POA: Diagnosis present

## 2016-10-23 DIAGNOSIS — Z8249 Family history of ischemic heart disease and other diseases of the circulatory system: Secondary | ICD-10-CM | POA: Diagnosis not present

## 2016-10-23 DIAGNOSIS — K565 Intestinal adhesions [bands], unspecified as to partial versus complete obstruction: Secondary | ICD-10-CM | POA: Diagnosis present

## 2016-10-23 DIAGNOSIS — Z801 Family history of malignant neoplasm of trachea, bronchus and lung: Secondary | ICD-10-CM | POA: Diagnosis not present

## 2016-10-23 DIAGNOSIS — Z90710 Acquired absence of both cervix and uterus: Secondary | ICD-10-CM

## 2016-10-23 DIAGNOSIS — R1900 Intra-abdominal and pelvic swelling, mass and lump, unspecified site: Secondary | ICD-10-CM | POA: Diagnosis present

## 2016-10-23 DIAGNOSIS — I10 Essential (primary) hypertension: Secondary | ICD-10-CM | POA: Diagnosis present

## 2016-10-23 DIAGNOSIS — E876 Hypokalemia: Secondary | ICD-10-CM | POA: Diagnosis present

## 2016-10-23 DIAGNOSIS — K5652 Intestinal adhesions [bands] with complete obstruction: Secondary | ICD-10-CM | POA: Diagnosis present

## 2016-10-23 DIAGNOSIS — Z0189 Encounter for other specified special examinations: Secondary | ICD-10-CM

## 2016-10-23 LAB — URINALYSIS, ROUTINE W REFLEX MICROSCOPIC
Bilirubin Urine: NEGATIVE
GLUCOSE, UA: NEGATIVE mg/dL
Hgb urine dipstick: NEGATIVE
Ketones, ur: 20 mg/dL — AB
Leukocytes, UA: NEGATIVE
Nitrite: NEGATIVE
PH: 5 (ref 5.0–8.0)
Protein, ur: 30 mg/dL — AB
Specific Gravity, Urine: 1.031 — ABNORMAL HIGH (ref 1.005–1.030)

## 2016-10-23 LAB — CBC WITH DIFFERENTIAL/PLATELET
Basophils Absolute: 0 10*3/uL (ref 0.0–0.1)
Basophils Relative: 0 %
Eosinophils Absolute: 0 10*3/uL (ref 0.0–0.7)
Eosinophils Relative: 0 %
HEMATOCRIT: 40.5 % (ref 36.0–46.0)
Hemoglobin: 13.4 g/dL (ref 12.0–15.0)
LYMPHS ABS: 1.3 10*3/uL (ref 0.7–4.0)
LYMPHS PCT: 12 %
MCH: 26.9 pg (ref 26.0–34.0)
MCHC: 33.1 g/dL (ref 30.0–36.0)
MCV: 81.2 fL (ref 78.0–100.0)
MONO ABS: 0.5 10*3/uL (ref 0.1–1.0)
MONOS PCT: 5 %
Neutro Abs: 8.7 10*3/uL — ABNORMAL HIGH (ref 1.7–7.7)
Neutrophils Relative %: 83 %
Platelets: 397 10*3/uL (ref 150–400)
RBC: 4.99 MIL/uL (ref 3.87–5.11)
RDW: 13.6 % (ref 11.5–15.5)
WBC: 10.6 10*3/uL — ABNORMAL HIGH (ref 4.0–10.5)

## 2016-10-23 LAB — COMPREHENSIVE METABOLIC PANEL
ALT: 20 U/L (ref 14–54)
ANION GAP: 7 (ref 5–15)
AST: 20 U/L (ref 15–41)
Albumin: 4.1 g/dL (ref 3.5–5.0)
Alkaline Phosphatase: 58 U/L (ref 38–126)
BILIRUBIN TOTAL: 0.6 mg/dL (ref 0.3–1.2)
BUN: 12 mg/dL (ref 6–20)
CALCIUM: 9.1 mg/dL (ref 8.9–10.3)
CO2: 29 mmol/L (ref 22–32)
Chloride: 102 mmol/L (ref 101–111)
Creatinine, Ser: 0.78 mg/dL (ref 0.44–1.00)
GFR calc Af Amer: >60 mL/min (ref >60)
Glucose, Bld: 107 mg/dL — ABNORMAL HIGH (ref 65–99)
POTASSIUM: 3.2 mmol/L — AB (ref 3.5–5.1)
Sodium: 138 mmol/L (ref 135–145)
TOTAL PROTEIN: 7.3 g/dL (ref 6.5–8.1)

## 2016-10-23 LAB — LIPASE, BLOOD: LIPASE: 18 U/L (ref 11–51)

## 2016-10-23 MED ORDER — ENOXAPARIN SODIUM 40 MG/0.4ML ~~LOC~~ SOLN
40.0000 mg | SUBCUTANEOUS | Status: DC
  Administered 2016-10-24 – 2016-10-27 (×5): 40 mg via SUBCUTANEOUS
  Filled 2016-10-23 (×4): qty 0.4

## 2016-10-23 MED ORDER — FAMOTIDINE IN NACL 20-0.9 MG/50ML-% IV SOLN
20.0000 mg | Freq: Two times a day (BID) | INTRAVENOUS | Status: DC
  Administered 2016-10-24 – 2016-10-28 (×10): 20 mg via INTRAVENOUS
  Filled 2016-10-23 (×11): qty 50

## 2016-10-23 MED ORDER — FENTANYL CITRATE (PF) 100 MCG/2ML IJ SOLN
50.0000 ug | Freq: Once | INTRAMUSCULAR | Status: AC
  Administered 2016-10-23: 50 ug via INTRAVENOUS
  Filled 2016-10-23: qty 2

## 2016-10-23 MED ORDER — ACETAMINOPHEN 650 MG RE SUPP: 650.0000 mg | Freq: Four times a day (QID) | RECTAL | Status: DC | PRN

## 2016-10-23 MED ORDER — IOPAMIDOL (ISOVUE-300) INJECTION 61%
100.0000 mL | Freq: Once | INTRAVENOUS | Status: AC | PRN
  Administered 2016-10-23: 100 mL via INTRAVENOUS

## 2016-10-23 MED ORDER — SODIUM CHLORIDE 0.9 % IV BOLUS (SEPSIS)
1000.0000 mL | Freq: Once | INTRAVENOUS | Status: AC
  Administered 2016-10-23: 1000 mL via INTRAVENOUS

## 2016-10-23 MED ORDER — ONDANSETRON HCL 4 MG PO TABS
4.0000 mg | ORAL_TABLET | Freq: Four times a day (QID) | ORAL | Status: DC | PRN
Start: 2016-10-23 — End: 2016-10-28

## 2016-10-23 MED ORDER — HYDRALAZINE HCL 20 MG/ML IJ SOLN
20.0000 mg | Freq: Four times a day (QID) | INTRAMUSCULAR | Status: DC | PRN
  Administered 2016-10-25: 20 mg via INTRAVENOUS
  Filled 2016-10-23: qty 1

## 2016-10-23 MED ORDER — IOPAMIDOL (ISOVUE-300) INJECTION 61%
INTRAVENOUS | Status: AC
  Filled 2016-10-23: qty 100

## 2016-10-23 MED ORDER — LATANOPROST 0.005 % OP SOLN
1.0000 [drp] | Freq: Every day | OPHTHALMIC | Status: DC
  Administered 2016-10-24 – 2016-10-27 (×5): 1 [drp] via OPHTHALMIC
  Filled 2016-10-23 (×2): qty 2.5

## 2016-10-23 MED ORDER — SODIUM CHLORIDE 0.9 % IV SOLN
30.0000 meq | Freq: Once | INTRAVENOUS | Status: AC
  Administered 2016-10-23: 30 meq via INTRAVENOUS
  Filled 2016-10-23: qty 15

## 2016-10-23 MED ORDER — ONDANSETRON HCL 4 MG/2ML IJ SOLN
4.0000 mg | Freq: Once | INTRAMUSCULAR | Status: AC
  Administered 2016-10-23: 4 mg via INTRAVENOUS
  Filled 2016-10-23: qty 2

## 2016-10-23 MED ORDER — MORPHINE SULFATE (PF) 2 MG/ML IV SOLN: 2.0000 mg | INTRAVENOUS | Status: DC | PRN

## 2016-10-23 MED ORDER — ONDANSETRON HCL 4 MG/2ML IJ SOLN
4.0000 mg | Freq: Four times a day (QID) | INTRAMUSCULAR | Status: DC | PRN
  Administered 2016-10-24 – 2016-10-27 (×7): 4 mg via INTRAVENOUS
  Filled 2016-10-23 (×7): qty 2

## 2016-10-23 MED ORDER — ACETAMINOPHEN 325 MG PO TABS: 650.0000 mg | ORAL_TABLET | Freq: Four times a day (QID) | ORAL | Status: DC | PRN

## 2016-10-23 MED ORDER — MORPHINE SULFATE (PF) 4 MG/ML IV SOLN
4.0000 mg | Freq: Once | INTRAVENOUS | Status: AC
  Administered 2016-10-23: 4 mg via INTRAVENOUS
  Filled 2016-10-23: qty 1

## 2016-10-23 MED ORDER — SODIUM CHLORIDE 0.9 % IV SOLN
INTRAVENOUS | Status: DC
  Administered 2016-10-23: 21:00:00 via INTRAVENOUS

## 2016-10-23 MED ORDER — MORPHINE SULFATE (PF) 4 MG/ML IV SOLN
2.0000 mg | INTRAVENOUS | Status: DC | PRN
  Administered 2016-10-23 – 2016-10-27 (×17): 2 mg via INTRAVENOUS
  Filled 2016-10-23 (×17): qty 1

## 2016-10-23 MED ORDER — LEVOTHYROXINE SODIUM 100 MCG IV SOLR
37.5000 ug | Freq: Every day | INTRAVENOUS | Status: DC
  Administered 2016-10-24 – 2016-10-28 (×5): 37.5 ug via INTRAVENOUS
  Filled 2016-10-23 (×5): qty 5

## 2016-10-23 MED ORDER — SODIUM CHLORIDE 0.9 % IJ SOLN
INTRAMUSCULAR | Status: AC
  Filled 2016-10-23: qty 50

## 2016-10-23 NOTE — Consult Note (Signed)
Reason for Consult:abd pain Referring Physician: Dr. Marge Duncans is an 56 y.o. female.  HPI:  The patient is a 56 year old black female who presented to the emergency department with abdominal pain and nausea and vomiting yesterday. She underwent a CT scan yesterday that showed no significant abnormality. She was discharged home. She continued to have intermittent abdominal pain with nausea and vomiting. She denies any fevers or chills. She had a bowel movement yesterday but no flatus or bowel movement today. She came back to the emergency department where a repeat CT scan shows a possible bowel obstruction with a possible transition point in the pelvis.  Past Medical History:  Diagnosis Date  . Chest pain 09/26/2015  . Essential hypertension 09/26/2015  . GERD (gastroesophageal reflux disease)   . Hypertension   . Hypothyroidism 09/26/2015  . Murmur 09/26/2015  . Thyroid disease     Past Surgical History:  Procedure Laterality Date  . ABDOMINAL HYSTERECTOMY    . CESAREAN SECTION      Family History  Problem Relation Age of Onset  . Cancer Mother     lung cancer  . Hypertension Mother   . Cancer Father   . Heart attack Father   . Mental illness Sister   . Hypertension Sister   . Hyperlipidemia Sister   . Diabetes Brother   . Hypertension Brother     Social History:  reports that she has never smoked. She has never used smokeless tobacco. She reports that she does not drink alcohol or use drugs.  Allergies: No Known Allergies  Medications: I have reviewed the patient's current medications.  Results for orders placed or performed during the hospital encounter of 10/23/16 (from the past 48 hour(s))  Urinalysis, Routine w reflex microscopic     Status: Abnormal   Collection Time: 10/23/16 11:42 AM  Result Value Ref Range   Color, Urine YELLOW YELLOW   APPearance HAZY (A) CLEAR   Specific Gravity, Urine 1.031 (H) 1.005 - 1.030   pH 5.0 5.0 - 8.0   Glucose, UA  NEGATIVE NEGATIVE mg/dL   Hgb urine dipstick NEGATIVE NEGATIVE   Bilirubin Urine NEGATIVE NEGATIVE   Ketones, ur 20 (A) NEGATIVE mg/dL   Protein, ur 30 (A) NEGATIVE mg/dL   Nitrite NEGATIVE NEGATIVE   Leukocytes, UA NEGATIVE NEGATIVE   RBC / HPF 0-5 0 - 5 RBC/hpf   WBC, UA 6-30 0 - 5 WBC/hpf   Bacteria, UA RARE (A) NONE SEEN   Squamous Epithelial / LPF 0-5 (A) NONE SEEN   Mucous PRESENT   CBC with Differential/Platelet     Status: Abnormal   Collection Time: 10/23/16  2:50 PM  Result Value Ref Range   WBC 10.6 (H) 4.0 - 10.5 K/uL   RBC 4.99 3.87 - 5.11 MIL/uL   Hemoglobin 13.4 12.0 - 15.0 g/dL   HCT 40.5 36.0 - 46.0 %   MCV 81.2 78.0 - 100.0 fL   MCH 26.9 26.0 - 34.0 pg   MCHC 33.1 30.0 - 36.0 g/dL   RDW 13.6 11.5 - 15.5 %   Platelets 397 150 - 400 K/uL   Neutrophils Relative % 83 %   Neutro Abs 8.7 (H) 1.7 - 7.7 K/uL   Lymphocytes Relative 12 %   Lymphs Abs 1.3 0.7 - 4.0 K/uL   Monocytes Relative 5 %   Monocytes Absolute 0.5 0.1 - 1.0 K/uL   Eosinophils Relative 0 %   Eosinophils Absolute 0.0 0.0 - 0.7 K/uL  Basophils Relative 0 %   Basophils Absolute 0.0 0.0 - 0.1 K/uL  Comprehensive metabolic panel     Status: Abnormal   Collection Time: 10/23/16  2:50 PM  Result Value Ref Range   Sodium 138 135 - 145 mmol/L   Potassium 3.2 (L) 3.5 - 5.1 mmol/L   Chloride 102 101 - 111 mmol/L   CO2 29 22 - 32 mmol/L   Glucose, Bld 107 (H) 65 - 99 mg/dL   BUN 12 6 - 20 mg/dL   Creatinine, Ser 0.78 0.44 - 1.00 mg/dL   Calcium 9.1 8.9 - 10.3 mg/dL   Total Protein 7.3 6.5 - 8.1 g/dL   Albumin 4.1 3.5 - 5.0 g/dL   AST 20 15 - 41 U/L   ALT 20 14 - 54 U/L   Alkaline Phosphatase 58 38 - 126 U/L   Total Bilirubin 0.6 0.3 - 1.2 mg/dL   GFR calc non Af Amer >60 >60 mL/min   GFR calc Af Amer >60 >60 mL/min    Comment: (NOTE) The eGFR has been calculated using the CKD EPI equation. This calculation has not been validated in all clinical situations. eGFR's persistently <60 mL/min signify  possible Chronic Kidney Disease.    Anion gap 7 5 - 15  Lipase, blood     Status: None   Collection Time: 10/23/16  2:50 PM  Result Value Ref Range   Lipase 18 11 - 51 U/L    Ct Abdomen Pelvis W Contrast  Result Date: 10/23/2016 CLINICAL DATA:  Lower abdominal pain with nausea EXAM: CT ABDOMEN AND PELVIS WITH CONTRAST TECHNIQUE: Multidetector CT imaging of the abdomen and pelvis was performed using the standard protocol following bolus administration of intravenous contrast. CONTRAST:  19m ISOVUE-300 IOPAMIDOL (ISOVUE-300) INJECTION 61% COMPARISON:  10/22/2016 FINDINGS: Lower chest: Mild dependent atelectatic changes are noted. No focal infiltrate is seen. Hepatobiliary: No focal liver abnormality is seen. No gallstones, gallbladder wall thickening, or biliary dilatation. Mild perihepatic fluid is seen. Pancreas:  Pancreas is within normal limits. Spleen: The spleen itself is within normal limits. Mild perisplenic fluid is noted which was not well appreciated on the prior exam. Adrenals/Urinary Tract: Kidneys demonstrate a normal enhancement pattern with the exception of left renal cyst which is stable. The adrenal glands are within normal limits. The bladder is partially distended. Stomach/Bowel: The appendix is well visualized and within normal limits. There are multiple dilated loops of small bowel which have progressed in the interval from the prior exam. There is some fecalization of small bowel contents noted within a few of the loops identified. A transition zone is noted deep within the right hemipelvis best seen on image number 68 of series 2. Adjacent to this area there is a soft tissue density with calcifications within. It has a somewhat spiculated appearance it is better visualized on the current exam due to the dilated loops of adjacent small bowel. Although this may represent some simple scarring the possibility of an underlying more aggressive process such as carcinoid could not be  totally excluded. The stomach and colon are otherwise within normal limits. Vascular/Lymphatic: Aortic atherosclerosis. No enlarged abdominal or pelvic lymph nodes. Reproductive: Status post hysterectomy. No adnexal masses. Other: Free pelvic fluid and free abdominal fluid is noted. This is likely related to the obstructive process. No findings to suggest acute perforation are noted at this time. Musculoskeletal: Bony structures are stable in appearance without acute abnormality. IMPRESSION: Spiculated soft tissue density in the small bowel mesenteric which measures  approximately 2.7 x 1.4 cm in greatest dimension. It has central calcifications and contributes to the abrupt caliber change of the distal small bowel as previously described. Although this may represent some postoperative scarring as it is adjacent to the hysterectomy site the possibility of a more aggressive process such as carcinoid deserves consideration as well. Surgical consultation is recommended. New free fluid in the pelvis and abdomen when compared with the prior exam. This is likely related to the underlying obstructive process. No definitive area of perforation is noted. No free air is seen. Normal-appearing appendix. The remainder of the exam is stable from the prior study. Electronically Signed   By: Inez Catalina M.D.   On: 10/23/2016 17:29   Ct Abdomen Pelvis W Contrast  Result Date: 10/22/2016 CLINICAL DATA:  Lower abdominal and pelvic pain beginning last night. EXAM: CT ABDOMEN AND PELVIS WITH CONTRAST TECHNIQUE: Multidetector CT imaging of the abdomen and pelvis was performed using the standard protocol following bolus administration of intravenous contrast. CONTRAST:  100 ml ISOVUE-300 IOPAMIDOL (ISOVUE-300) INJECTION 61% COMPARISON:  Plain films chest and abdomen 03/09/2016. FINDINGS: Lower chest: Mild dependent atelectasis is seen in the lung bases. No pleural or pericardial effusion. Hepatobiliary: No focal liver abnormality is  seen. No gallstones, gallbladder wall thickening, or biliary dilatation. Pancreas: Unremarkable. No pancreatic ductal dilatation or surrounding inflammatory changes. Spleen: Normal in size without focal abnormality. Adrenals/Urinary Tract: The adrenal glands appear normal. 2.1 cm simple left renal cyst is identified. Kidneys otherwise appear normal. Ureters and urinary bladder appear normal. Stomach/Bowel: Stomach is within normal limits. Appendix appears normal. No evidence of bowel wall thickening, distention, or inflammatory changes. Vascular/Lymphatic: No significant vascular findings are present. No enlarged abdominal or pelvic lymph nodes. Reproductive: Status post hysterectomy. No adnexal masses. Other: No abdominal wall hernia or abnormality. No abdominopelvic ascites. Musculoskeletal: Loss of disc space height and endplate sclerosis are seen at L5-S1. Small bone island left ilium noted. No worrisome bone lesion. IMPRESSION: No acute abnormality abdomen or pelvis. Degenerative disc disease L5-S1. Status post hysterectomy. Electronically Signed   By: Inge Rise M.D.   On: 10/22/2016 11:20    Review of Systems  Constitutional: Negative.   HENT: Negative.   Eyes: Negative.   Respiratory: Negative.   Cardiovascular: Negative.   Gastrointestinal: Positive for abdominal pain, nausea and vomiting.  Genitourinary: Negative.   Musculoskeletal: Negative.   Skin: Negative.   Neurological: Negative.   Endo/Heme/Allergies: Negative.   Psychiatric/Behavioral: Negative.    Blood pressure 164/60, pulse (!) 59, temperature 98.4 F (36.9 C), temperature source Oral, resp. rate 18, height _0  (1.575 m), weight 74.8 kg (165 lb), SpO2 100 %. Physical Exam  Constitutional: She is oriented to person, place, and time. She appears well-developed and well-nourished.  HENT:  Head: Normocephalic and atraumatic.  Eyes: Conjunctivae and EOM are normal. Pupils are equal, round, and reactive to light.  Neck:  Normal range of motion. Neck supple.  Cardiovascular: Normal rate, regular rhythm and normal heart sounds.   Respiratory: Effort normal and breath sounds normal.  GI: Soft.  There is mild distension but no guarding or tenderness  Musculoskeletal: Normal range of motion.  Neurological: She is alert and oriented to person, place, and time.  Skin: Skin is warm and dry.  Psychiatric: She has a normal mood and affect. Her behavior is normal.    Assessment/Plan:  The patient appears to have a small bowel obstruction. This is most likely related to scar tissue from previous surgery although  the CT scan and questions whether there could be something like a carcinoid tumor down in the pelvis. There is no mention of this on yesterday's CT scan. At this point I would recommend treating her with bowel rest and NG tube decompression. She can start on the small bowel protocol in the morning. If she improves then she may not require any surgical intervention. If she does not improve then she may come to require exploration. I have discussed this with her and she is in agreement with this treatment plan.  TOTH III,PAUL S 10/23/2016, 8:23 PM

## 2016-10-23 NOTE — ED Triage Notes (Addendum)
Patient reports right lower abdominal pain with nausea.  States nausea is worse today than yesterday. Pt was seen last night for same-states sent with RX for phenergan but did not try to take this morning.

## 2016-10-23 NOTE — H&P (Signed)
History and Physical    Debbie Brandt ZOX:[id number] DOB: [birth year] DOA: 10/23/2016  PCP: Darrow Bussing, MD   Patient coming from: Home  Chief Complaint: Abdominal pain, nausea, and vomiting.  HPI: JAKI STEPTOE is a 56 y.o. woman with a history of HTN, hypothyroidism, and GERD who developed abdominal pain on Monday morning that awakened her out of sleep around 5AM.  Abdominal pain described as sharp and intermittent, 10 out of 10 in intensity at its worst.  Advil has provided intermittent relief.  The pain has been associated with nausea and vomiting.  She is not tolerating solid or liquids at this time.  Emesis has been nonbloody.  No fevers.  No light-headedness or syncope.  Last bowel movement yesterday and reportedly normal in appearance.  No history of constipation.  She was evaluated in the ED yesterday for similar complaints.  CT of A/P yesterday did not show acute findings.  She was discharged home.  ED Course: Repeat CT A/P in the ED tonight concerning for small bowel obstruction with probable transition zone.  She also has a 2.7cm spiculated soft tissue density of unclear etiology.  General surgery has been consulted from the ED.  Hospitalist asked to admit.  She has received 1L NS, two doses of zofran, one dose of fentanyl, and one dose of morphine in the ED.  Review of Systems: As per HPI otherwise 10 point review of systems negative.  No cough, cold, congestion.   Past Medical History:  Diagnosis Date  . Chest pain 09/26/2015  . Essential hypertension 09/26/2015  . GERD (gastroesophageal reflux disease)   . Hypertension   . Hypothyroidism 09/26/2015  . Murmur 09/26/2015  . Thyroid disease     Past Surgical History:  Procedure Laterality Date  . ABDOMINAL HYSTERECTOMY    . CESAREAN SECTION       reports that she has never smoked. She has never used smokeless tobacco. She reports that she does not drink alcohol or use drugs.  She is a widow.  Her only son also died  at age 29.  No Known Allergies  Family History  Problem Relation Age of Onset  . Cancer Mother     lung cancer  . Hypertension Mother   . Cancer Father   . Heart attack Father   . Mental illness Sister   . Hypertension Sister   . Hyperlipidemia Sister   . Diabetes Brother   . Hypertension Brother      Prior to Admission medications   Medication Sig Start Date End Date Taking? Authorizing Provider  Calcium Carbonate-Vitamin D 600-400 MG-UNIT per tablet Take 1 tablet by mouth daily.   Yes Historical Provider, MD  levothyroxine (SYNTHROID, LEVOTHROID) 75 MCG tablet Take 75 mcg by mouth daily before breakfast.   Yes Historical Provider, MD  MULTIPLE VITAMIN PO Take 1 tablet by mouth daily.   Yes Historical Provider, MD  omeprazole (PRILOSEC) 20 MG capsule Take 1 capsule (20 mg total) by mouth daily. 03/09/16  Yes April Palumbo, MD  promethazine (PHENERGAN) 25 MG tablet Take 25 mg by mouth every 8 (eight) hours as needed for nausea/vomiting. 10/22/16  Yes Historical Provider, MD  TRAVATAN Z 0.004 % SOLN ophthalmic solution Place 1 drop into the right eye daily at 6 PM.  12/10/15  Yes Historical Provider, MD  triamterene-hydrochlorothiazide (MAXZIDE-25) 37.5-25 MG per tablet Take 1 tablet by mouth daily.   Yes Historical Provider, MD  methocarbamol (ROBAXIN) 500 MG tablet Take 1 tablet (500 mg total)  by mouth 2 (two) times daily. Patient not taking: Reported on 10/22/2016 03/09/16   April Palumbo, MD    Physical Exam: Vitals:   10/23/16 1136 10/23/16 1523  BP: 158/67 164/68  Pulse: 65 (!) 59  Resp: 18 18  Temp: 99.2 F (37.3 C) 98.4 F (36.9 C)  TempSrc: Oral Oral  SpO2: 100% 100%  Weight: 74.8 kg (165 lb)   Height: 5\' 2"  (1.575 m)       Constitutional: NAD, calm, comfortable, NONtoxic appearing. Vitals:   10/23/16 1136 10/23/16 1523  BP: 158/67 164/68  Pulse: 65 (!) 59  Resp: 18 18  Temp: 99.2 F (37.3 C) 98.4 F (36.9 C)  TempSrc: Oral Oral  SpO2: 100% 100%  Weight:  74.8 kg (165 lb)   Height: 5\' 2"  (1.575 m)    Eyes: PERRL, lids and conjunctivae normal ENMT: Mucous membranes are slightly dry. Posterior pharynx clear of any exudate or lesions. Normal dentition.  Neck: normal appearance, supple, no masses Respiratory: clear to auscultation bilaterally, no wheezing, no crackles. Normal respiratory effort. No accessory muscle use.  Cardiovascular: Normal rate, regular rhythm, + murmur.  No rubs / gallops. No extremity edema. 2+ pedal pulses. GI: abdomen is mildly distended but remains soft and compressible.  She has TTP to the LLQ.  No rebound. No guarding.  Bowel sounds are hypoactive.   Musculoskeletal:  No joint deformity in upper and lower extremities. Good ROM, no contractures. Normal muscle tone.  Skin: no rashes, warm and dry Neurologic: CN 2-12 grossly intact. Sensation intact, Strength symmetric bilaterally, 5/5. Psychiatric: Normal judgment and insight. Alert and oriented x 3. Normal mood.     Labs on Admission: I have personally reviewed following labs and imaging studies  CBC:  Recent Labs Lab 10/22/16 0741 10/23/16 1450  WBC 6.5 10.6*  NEUTROABS 5.8 8.7*  HGB 11.8* 13.4  HCT 36.3 40.5  MCV 82.9 81.2  PLT 377 397   Basic Metabolic Panel:  Recent Labs Lab 10/22/16 0741 10/23/16 1450  NA 140 138  K 3.6 3.2*  CL 105 102  CO2 27 29  GLUCOSE 127* 107*  BUN 19 12  CREATININE 0.85 0.78  CALCIUM 9.2 9.1   GFR: Estimated Creatinine Clearance: 73.5 mL/min (by C-G formula based on SCr of 0.78 mg/dL). Liver Function Tests:  Recent Labs Lab 10/22/16 0741 10/23/16 1450  AST 22 20  ALT 20 20  ALKPHOS 69 58  BILITOT 0.4 0.6  PROT 7.1 7.3  ALBUMIN 4.1 4.1    Recent Labs Lab 10/22/16 0741 10/23/16 1450  LIPASE 26 18   Urine analysis:    Component Value Date/Time   COLORURINE YELLOW 10/23/2016 1142   APPEARANCEUR HAZY (A) 10/23/2016 1142   LABSPEC 1.031 (H) 10/23/2016 1142   PHURINE 5.0 10/23/2016 1142   GLUCOSEU  NEGATIVE 10/23/2016 1142   HGBUR NEGATIVE 10/23/2016 1142   BILIRUBINUR NEGATIVE 10/23/2016 1142   KETONESUR 20 (A) 10/23/2016 1142   PROTEINUR 30 (A) 10/23/2016 1142   UROBILINOGEN 0.2 04/15/2008 1541   NITRITE NEGATIVE 10/23/2016 1142   LEUKOCYTESUR NEGATIVE 10/23/2016 1142   Sepsis Labs:  Lactic acid level 1.62  Radiological Exams on Admission: Ct Abdomen Pelvis W Contrast  Result Date: 10/23/2016 CLINICAL DATA:  Lower abdominal pain with nausea EXAM: CT ABDOMEN AND PELVIS WITH CONTRAST TECHNIQUE: Multidetector CT imaging of the abdomen and pelvis was performed using the standard protocol following bolus administration of intravenous contrast. CONTRAST:  100mL ISOVUE-300 IOPAMIDOL (ISOVUE-300) INJECTION 61% COMPARISON:  10/22/2016 FINDINGS:  Lower chest: Mild dependent atelectatic changes are noted. No focal infiltrate is seen. Hepatobiliary: No focal liver abnormality is seen. No gallstones, gallbladder wall thickening, or biliary dilatation. Mild perihepatic fluid is seen. Pancreas:  Pancreas is within normal limits. Spleen: The spleen itself is within normal limits. Mild perisplenic fluid is noted which was not well appreciated on the prior exam. Adrenals/Urinary Tract: Kidneys demonstrate a normal enhancement pattern with the exception of left renal cyst which is stable. The adrenal glands are within normal limits. The bladder is partially distended. Stomach/Bowel: The appendix is well visualized and within normal limits. There are multiple dilated loops of small bowel which have progressed in the interval from the prior exam. There is some fecalization of small bowel contents noted within a few of the loops identified. A transition zone is noted deep within the right hemipelvis best seen on image number 68 of series 2. Adjacent to this area there is a soft tissue density with calcifications within. It has a somewhat spiculated appearance it is better visualized on the current exam due to the  dilated loops of adjacent small bowel. Although this may represent some simple scarring the possibility of an underlying more aggressive process such as carcinoid could not be totally excluded. The stomach and colon are otherwise within normal limits. Vascular/Lymphatic: Aortic atherosclerosis. No enlarged abdominal or pelvic lymph nodes. Reproductive: Status post hysterectomy. No adnexal masses. Other: Free pelvic fluid and free abdominal fluid is noted. This is likely related to the obstructive process. No findings to suggest acute perforation are noted at this time. Musculoskeletal: Bony structures are stable in appearance without acute abnormality. IMPRESSION: Spiculated soft tissue density in the small bowel mesenteric which measures approximately 2.7 x 1.4 cm in greatest dimension. It has central calcifications and contributes to the abrupt caliber change of the distal small bowel as previously described. Although this may represent some postoperative scarring as it is adjacent to the hysterectomy site the possibility of a more aggressive process such as carcinoid deserves consideration as well. Surgical consultation is recommended. New free fluid in the pelvis and abdomen when compared with the prior exam. This is likely related to the underlying obstructive process. No definitive area of perforation is noted. No free air is seen. Normal-appearing appendix. The remainder of the exam is stable from the prior study. Electronically Signed   By: Alcide Clever M.D.   On: 10/23/2016 17:29   Ct Abdomen Pelvis W Contrast  Result Date: 10/22/2016 CLINICAL DATA:  Lower abdominal and pelvic pain beginning last night. EXAM: CT ABDOMEN AND PELVIS WITH CONTRAST TECHNIQUE: Multidetector CT imaging of the abdomen and pelvis was performed using the standard protocol following bolus administration of intravenous contrast. CONTRAST:  100 ml ISOVUE-300 IOPAMIDOL (ISOVUE-300) INJECTION 61% COMPARISON:  Plain films chest and  abdomen 03/09/2016. FINDINGS: Lower chest: Mild dependent atelectasis is seen in the lung bases. No pleural or pericardial effusion. Hepatobiliary: No focal liver abnormality is seen. No gallstones, gallbladder wall thickening, or biliary dilatation. Pancreas: Unremarkable. No pancreatic ductal dilatation or surrounding inflammatory changes. Spleen: Normal in size without focal abnormality. Adrenals/Urinary Tract: The adrenal glands appear normal. 2.1 cm simple left renal cyst is identified. Kidneys otherwise appear normal. Ureters and urinary bladder appear normal. Stomach/Bowel: Stomach is within normal limits. Appendix appears normal. No evidence of bowel wall thickening, distention, or inflammatory changes. Vascular/Lymphatic: No significant vascular findings are present. No enlarged abdominal or pelvic lymph nodes. Reproductive: Status post hysterectomy. No adnexal masses. Other: No abdominal wall  hernia or abnormality. No abdominopelvic ascites. Musculoskeletal: Loss of disc space height and endplate sclerosis are seen at L5-S1. Small bone island left ilium noted. No worrisome bone lesion. IMPRESSION: No acute abnormality abdomen or pelvis. Degenerative disc disease L5-S1. Status post hysterectomy. Electronically Signed   By: Drusilla Kanner M.D.   On: 10/22/2016 11:20    Assessment/Plan Principal Problem:   Small bowel obstruction Active Problems:   HTN (hypertension)   Hypothyroidism   Abdominal mass   Hypokalemia      Small bowel obstruction and possible abdominal mass --NPO --General surgery to see; expect she will need ex lap --NS at 125cc/hr --Oral care --Anti-emetics and analgesics as needed --Monitor abdominal exam  HTN --IV hydralazine prn  Hypothyroidism --IV levothyroxine (half oral dose)  GERD --IV protonix 20mg  BID  Hypokalemia --Replacement ordered  Other --travatan (or formulary equivalent) to right eye daily   DVT prophylaxis: Lovenox Code Status:  FULL Family Communication: Patient's brother present in the ED at time of admission. Disposition Plan: To be determined. Consults called: General Surgery (Dr. Carolynne Edouard) Admission status: Inpatient, med surg.  I expect this patient will need inpatient services for greater than two midnights.   TIME SPENT: 50 minutes   Jerene Bears MD [facility] Pager [phone]  If 7PM-7AM, please contact night-coverage [email or web address] Password [email or web address]  10/23/2016, 7:15 PM

## 2016-10-23 NOTE — ED Provider Notes (Signed)
Received in turnover from Dr. Eudelia Bunchardama. Patient with right lower quadrant tenderness seen yesterday with CT scan that was read as normal. Return to see her PCP today and had persistent ongoing pain and leukocytosis. Was sent here for repeat evaluation. White blood cell count is 10.6.  Repeat CT scan with concern for small bowel obstruction. 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 Will admit. Discussed with Dr. Carolynne Edouardoth, general surgery.   Melene Planan Chanita Boden, DO 10/23/16 1812

## 2016-10-23 NOTE — ED Notes (Signed)
She is aware of plan to see specialist (surgery); and admit to hospital, with which she agrees. She is in no distress.

## 2016-10-23 NOTE — ED Provider Notes (Signed)
[facility]-EMERGENCY DEPT Provider Note   CSN: [id number] Arrival date & time: 10/23/16  1115     History   Chief Complaint Chief Complaint  Patient presents with  . Abdominal Pain    HPI Debbie Brandt is a 56 y.o. female.  The history is provided by the patient.  Abdominal Pain   This is a new problem. The current episode started yesterday. Episode frequency: constant. Progression since onset: fluctuating. The pain is located in the periumbilical region and suprapubic region. The quality of the pain is aching. The pain is moderate. Associated symptoms include nausea and vomiting. Pertinent negatives include fever, diarrhea and dysuria. Nothing aggravates the symptoms. Nothing relieves the symptoms. Past workup includes CT scan (yesterday was negative).    Past Medical History:  Diagnosis Date  . Chest pain 09/26/2015  . Essential hypertension 09/26/2015  . GERD (gastroesophageal reflux disease)   . Hypertension   . Hypothyroidism 09/26/2015  . Murmur 09/26/2015  . Thyroid disease     Patient Active Problem List   Diagnosis Date Noted  . Murmur 09/26/2015  . Chest pain 09/26/2015  . Essential hypertension 09/26/2015  . Hypothyroidism 09/26/2015    Past Surgical History:  Procedure Laterality Date  . ABDOMINAL HYSTERECTOMY    . CESAREAN SECTION      OB History    No data available       Home Medications    Prior to Admission medications   Medication Sig Start Date End Date Taking? Authorizing Provider  Calcium Carbonate-Vitamin D 600-400 MG-UNIT per tablet Take 1 tablet by mouth daily.   Yes Historical Provider, MD  levothyroxine (SYNTHROID, LEVOTHROID) 75 MCG tablet Take 75 mcg by mouth daily before breakfast.   Yes Historical Provider, MD  MULTIPLE VITAMIN PO Take 1 tablet by mouth daily.   Yes Historical Provider, MD  omeprazole (PRILOSEC) 20 MG capsule Take 1 capsule (20 mg total) by mouth daily. 03/09/16  Yes April Palumbo, MD  promethazine (PHENERGAN) 25 MG  tablet Take 25 mg by mouth every 8 (eight) hours as needed for nausea/vomiting. 10/22/16  Yes Historical Provider, MD  TRAVATAN Z 0.004 % SOLN ophthalmic solution Place 1 drop into the right eye daily at 6 PM.  12/10/15  Yes Historical Provider, MD  triamterene-hydrochlorothiazide (MAXZIDE-25) 37.5-25 MG per tablet Take 1 tablet by mouth daily.   Yes Historical Provider, MD  methocarbamol (ROBAXIN) 500 MG tablet Take 1 tablet (500 mg total) by mouth 2 (two) times daily. Patient not taking: Reported on 10/22/2016 03/09/16   April Palumbo, MD    Family History Family History  Problem Relation Age of Onset  . Cancer Mother     lung cancer  . Hypertension Mother   . Cancer Father   . Heart attack Father   . Mental illness Sister   . Hypertension Sister   . Hyperlipidemia Sister   . Diabetes Brother   . Hypertension Brother     Social History Social History  Substance Use Topics  . Smoking status: Never Smoker  . Smokeless tobacco: Never Used  . Alcohol use No     Allergies   Patient has no known allergies.   Review of Systems Review of Systems  Constitutional: Negative for fever.  Gastrointestinal: Positive for abdominal pain, nausea and vomiting. Negative for diarrhea.  Genitourinary: Negative for dysuria.   Ten systems are reviewed and are negative for acute change except as noted in the HPI   Physical Exam Updated Vital Signs BP 158/67 (  BP Location: Right Arm)   Pulse 65   Temp 99.2 F (37.3 C) (Oral)   Resp 18   Ht 5\' 2"  (1.575 m)   Wt 165 lb (74.8 kg)   SpO2 100%   BMI 30.18 kg/m   Physical Exam  Constitutional: She is oriented to person, place, and time. She appears well-developed and well-nourished. No distress.  HENT:  Head: Normocephalic and atraumatic.  Nose: Nose normal.  Eyes: Conjunctivae and EOM are normal. Pupils are equal, round, and reactive to light. Right eye exhibits no discharge. Left eye exhibits no discharge. No scleral icterus.  Neck:  Normal range of motion. Neck supple.  Cardiovascular: Normal rate and regular rhythm.  Exam reveals no gallop and no friction rub.   No murmur heard. Pulmonary/Chest: Effort normal and breath sounds normal. No stridor. No respiratory distress. She has no rales.  Abdominal: Soft. She exhibits no distension. There is tenderness in the right lower quadrant. There is no rigidity, no rebound, no guarding and no CVA tenderness.  Musculoskeletal: She exhibits no edema or tenderness.  Neurological: She is alert and oriented to person, place, and time.  Skin: Skin is warm and dry. No rash noted. She is not diaphoretic. No erythema.  Psychiatric: She has a normal mood and affect.  Vitals reviewed.    ED Treatments / Results  Labs (all labs ordered are listed, but only abnormal results are displayed) Labs Reviewed  URINALYSIS, ROUTINE W REFLEX MICROSCOPIC - Abnormal; Notable for the following:       Result Value   APPearance HAZY (*)    Specific Gravity, Urine 1.031 (*)    Ketones, ur 20 (*)    Protein, ur 30 (*)    Bacteria, UA RARE (*)    Squamous Epithelial / LPF 0-5 (*)    All other components within normal limits  CBC WITH DIFFERENTIAL/PLATELET - Abnormal; Notable for the following:    WBC 10.6 (*)    Neutro Abs 8.7 (*)    All other components within normal limits  COMPREHENSIVE METABOLIC PANEL - Abnormal; Notable for the following:    Potassium 3.2 (*)    Glucose, Bld 107 (*)    All other components within normal limits  LIPASE, BLOOD    EKG  EKG Interpretation None       Radiology  Ct Abdomen Pelvis W Contrast  Result Date: 10/22/2016 CLINICAL DATA:  Lower abdominal and pelvic pain beginning last night. EXAM: CT ABDOMEN AND PELVIS WITH CONTRAST TECHNIQUE: Multidetector CT imaging of the abdomen and pelvis was performed using the standard protocol following bolus administration of intravenous contrast. CONTRAST:  100 ml ISOVUE-300 IOPAMIDOL (ISOVUE-300) INJECTION 61%  COMPARISON:  Plain films chest and abdomen 03/09/2016. FINDINGS: Lower chest: Mild dependent atelectasis is seen in the lung bases. No pleural or pericardial effusion. Hepatobiliary: No focal liver abnormality is seen. No gallstones, gallbladder wall thickening, or biliary dilatation. Pancreas: Unremarkable. No pancreatic ductal dilatation or surrounding inflammatory changes. Spleen: Normal in size without focal abnormality. Adrenals/Urinary Tract: The adrenal glands appear normal. 2.1 cm simple left renal cyst is identified. Kidneys otherwise appear normal. Ureters and urinary bladder appear normal. Stomach/Bowel: Stomach is within normal limits. Appendix appears normal. No evidence of bowel wall thickening, distention, or inflammatory changes. Vascular/Lymphatic: No significant vascular findings are present. No enlarged abdominal or pelvic lymph nodes. Reproductive: Status post hysterectomy. No adnexal masses. Other: No abdominal wall hernia or abnormality. No abdominopelvic ascites. Musculoskeletal: Loss of disc space height and endplate sclerosis  are seen at L5-S1. Small bone island left ilium noted. No worrisome bone lesion. IMPRESSION: No acute abnormality abdomen or pelvis. Degenerative disc disease L5-S1. Status post hysterectomy. Electronically Signed   By: Drusilla Kanner M.D.   On: 10/22/2016 11:20    Procedures Procedures (including critical care time)  Medications Ordered in ED Medications  iopamidol (ISOVUE-300) 61 % injection (not administered)  sodium chloride 0.9 % injection (not administered)  iopamidol (ISOVUE-300) 61 % injection (not administered)  sodium chloride 0.9 % injection (not administered)  ondansetron (ZOFRAN) injection 4 mg (4 mg Intravenous Given 10/23/16 1453)  sodium chloride 0.9 % bolus 1,000 mL (0 mLs Intravenous Stopped 10/23/16 1655)  fentaNYL (SUBLIMAZE) injection 50 mcg (50 mcg Intravenous Given 10/23/16 1453)  fentaNYL (SUBLIMAZE) injection 50 mcg (50 mcg  Intravenous Given 10/23/16 1641)  ondansetron (ZOFRAN) injection 4 mg (4 mg Intravenous Given 10/23/16 1641)  iopamidol (ISOVUE-300) 61 % injection 100 mL (100 mLs Intravenous Contrast Given 10/23/16 1705)     Initial Impression / Assessment and Plan / ED Course  I have reviewed the triage vital signs and the nursing notes.  Pertinent labs & imaging results that were available during my care of the patient were reviewed by me and considered in my medical decision making (see chart for details).     Repeat labs with leukocytosis, up from 6 yesterday. Given the RLQ pain. Will obtain CT to rule out appendicitis.  Patient care turned over to Dr Adela Lank at 1630. Patient case and results discussed in detail; please see their note for further ED managment.       Nira Conn, MD 10/23/16 [phone]

## 2016-10-23 NOTE — ED Notes (Addendum)
Called to inquire if room was cleaned, and was told pt would be going into rm 1529.

## 2016-10-24 ENCOUNTER — Inpatient Hospital Stay (HOSPITAL_COMMUNITY): Payer: Managed Care, Other (non HMO)

## 2016-10-24 DIAGNOSIS — K56609 Unspecified intestinal obstruction, unspecified as to partial versus complete obstruction: Secondary | ICD-10-CM

## 2016-10-24 DIAGNOSIS — E876 Hypokalemia: Secondary | ICD-10-CM

## 2016-10-24 DIAGNOSIS — I10 Essential (primary) hypertension: Secondary | ICD-10-CM

## 2016-10-24 DIAGNOSIS — R1900 Intra-abdominal and pelvic swelling, mass and lump, unspecified site: Secondary | ICD-10-CM

## 2016-10-24 DIAGNOSIS — E039 Hypothyroidism, unspecified: Secondary | ICD-10-CM

## 2016-10-24 LAB — PROTIME-INR
INR: 1.15
Prothrombin Time: 14.7 seconds (ref 11.4–15.2)

## 2016-10-24 LAB — BASIC METABOLIC PANEL
ANION GAP: 6 (ref 5–15)
BUN: 13 mg/dL (ref 6–20)
CALCIUM: 8.6 mg/dL — AB (ref 8.9–10.3)
CO2: 28 mmol/L (ref 22–32)
Chloride: 110 mmol/L (ref 101–111)
Creatinine, Ser: 0.84 mg/dL (ref 0.44–1.00)
GFR calc non Af Amer: >60 mL/min (ref >60)
Glucose, Bld: 102 mg/dL — ABNORMAL HIGH (ref 65–99)
Potassium: 3.5 mmol/L (ref 3.5–5.1)
Sodium: 144 mmol/L (ref 135–145)

## 2016-10-24 LAB — CBC
HEMATOCRIT: 38.3 % (ref 36.0–46.0)
HEMOGLOBIN: 12.4 g/dL (ref 12.0–15.0)
MCH: 27.3 pg (ref 26.0–34.0)
MCHC: 32.4 g/dL (ref 30.0–36.0)
MCV: 84.2 fL (ref 78.0–100.0)
Platelets: 390 10*3/uL (ref 150–400)
RBC: 4.55 MIL/uL (ref 3.87–5.11)
RDW: 14.1 % (ref 11.5–15.5)
WBC: 7.5 10*3/uL (ref 4.0–10.5)

## 2016-10-24 MED ORDER — MENTHOL 3 MG MT LOZG
1.0000 | LOZENGE | OROMUCOSAL | Status: DC | PRN
  Administered 2016-10-27: 3 mg via ORAL
  Filled 2016-10-24: qty 9

## 2016-10-24 MED ORDER — KCL IN DEXTROSE-NACL 20-5-0.45 MEQ/L-%-% IV SOLN
INTRAVENOUS | Status: DC
  Administered 2016-10-24 – 2016-10-25 (×3): via INTRAVENOUS
  Administered 2016-10-25: 1000 mL via INTRAVENOUS
  Administered 2016-10-25 – 2016-10-28 (×6): via INTRAVENOUS
  Filled 2016-10-24 (×12): qty 1000

## 2016-10-24 MED ORDER — DIATRIZOATE MEGLUMINE & SODIUM 66-10 % PO SOLN
90.0000 mL | Freq: Once | ORAL | Status: AC
  Administered 2016-10-24: 90 mL via NASOGASTRIC
  Filled 2016-10-24: qty 90

## 2016-10-24 MED ORDER — PHENOL 1.4 % MT LIQD
1.0000 | OROMUCOSAL | Status: DC | PRN
  Filled 2016-10-24 (×3): qty 177

## 2016-10-24 NOTE — Progress Notes (Signed)
PROGRESS NOTE  Debbie Brandt  ZOX:[id number] DOB: November 15, 1960 DOA: 10/23/2016 PCP: Darrow Bussing, MD  Brief Narrative:   The patient is a 56 year old female with history of hypertension, hypothyroidism, GERD who presented with abdominal pain that was 10 out of 10, sharp and associated with nausea and vomiting. Abdominal CT demonstrated evidence of a small bowel obstruction with a 2.7 cm between soft tissues shoe density of unclear etiology. She has been admitted in general surgery is consulting.  Assessment & Plan:   Principal Problem:   Small bowel obstruction Active Problems:   HTN (hypertension)   Hypothyroidism   Abdominal mass   Hypokalemia  Small bowel obstruction and possible abdominal mass --NPO  -  Nurses about to put in her NG tube --General surgery assistance appreciated --Change to dextrose containing IV fluids --Advised patient to minimize use of IV narcotics -  Encouraged ambulation  HTN --IV hydralazine prn  Hypothyroidism --IV levothyroxine (half oral dose)  GERD --IV protonix 20mg  BID  Hypokalemia --Replacement ordered  Other --travatan (or formulary equivalent) to right eye daily   DVT prophylaxis: Lovenox Code Status: FULL Family Communication:  no family present at bedside  Disposition Plan:  anticipate home in a few days, she requires surgery to relieve her small bowel obstruction  Consultants:   General surgery  Procedures:  None  Antimicrobials:  Anti-infectives    None       Subjective: Feels like her abdominal pain is becoming less frequent. Continues to have intermittent pains in the periumbilical area 10 out of 10 without radiation. She feels bloated. Denies any vomiting since admission.  Objective: Vitals:   10/23/16 2004 10/23/16 2038 10/24/16 0517 10/24/16 1356  BP:  (!) 156/58 139/64 (!) 153/55  Pulse:  62 (!) 57 61  Resp:  16 15 15   Temp:  99.8 F (37.7 C) 99.3 F (37.4 C) 98 F (36.7 C)  TempSrc:  Oral  Oral Oral  SpO2: 100% 100% 98% 99%  Weight:      Height:        Intake/Output Summary (Last 24 hours) at 10/24/16 1904 Last data filed at 10/24/16 1800  Gross per 24 hour  Intake             2815 ml  Output              305 ml  Net             2510 ml   Filed Weights   10/23/16 1136  Weight: 74.8 kg (165 lb)    Examination:  General exam:  Adult Female.  No acute distress.  HEENT:  NCAT, MMM Respiratory system: Clear to auscultation bilaterally Cardiovascular system: Regular rate and rhythm, normal S1/S2. No murmurs, rubs, gallops or clicks.  Warm extremities Gastrointestinal system: Hypoactive and slightly higher pitched bowel sounds, soft, mildly distended, tender in the area just to the right of the umbilicus MSK:  Normal tone and bulk, no lower extremity edema Neuro:  Grossly intact    Data Reviewed: I have personally reviewed following labs and imaging studies  CBC:  Recent Labs Lab 10/22/16 0741 10/23/16 1450 10/24/16 0516  WBC 6.5 10.6* 7.5  NEUTROABS 5.8 8.7*  --   HGB 11.8* 13.4 12.4  HCT 36.3 40.5 38.3  MCV 82.9 81.2 84.2  PLT 377 397 390   Basic Metabolic Panel:  Recent Labs Lab 10/22/16 0741 10/23/16 1450 10/24/16 0516  NA 140 138 144  K 3.6 3.2* 3.5  CL 105  102 110  CO2 27 29 28   GLUCOSE 127* 107* 102*  BUN 19 12 13   CREATININE 0.85 0.78 0.84  CALCIUM 9.2 9.1 8.6*   GFR: Estimated Creatinine Clearance: 70 mL/min (by C-G formula based on SCr of 0.84 mg/dL). Liver Function Tests:  Recent Labs Lab 10/22/16 0741 10/23/16 1450  AST 22 20  ALT 20 20  ALKPHOS 69 58  BILITOT 0.4 0.6  PROT 7.1 7.3  ALBUMIN 4.1 4.1    Recent Labs Lab 10/22/16 0741 10/23/16 1450  LIPASE 26 18   No results for input(s): AMMONIA in the last 168 hours. Coagulation Profile:  Recent Labs Lab 10/24/16 0516  INR 1.15   Cardiac Enzymes: No results for input(s): CKTOTAL, CKMB, CKMBINDEX, TROPONINI in the last 168 hours. BNP (last 3 results) No  results for input(s): PROBNP in the last 8760 hours. HbA1C: No results for input(s): HGBA1C in the last 72 hours. CBG: No results for input(s): GLUCAP in the last 168 hours. Lipid Profile: No results for input(s): CHOL, HDL, LDLCALC, TRIG, CHOLHDL, LDLDIRECT in the last 72 hours. Thyroid Function Tests: No results for input(s): TSH, T4TOTAL, FREET4, T3FREE, THYROIDAB in the last 72 hours. Anemia Panel: No results for input(s): VITAMINB12, FOLATE, FERRITIN, TIBC, IRON, RETICCTPCT in the last 72 hours. Urine analysis:    Component Value Date/Time   COLORURINE YELLOW 10/23/2016 1142   APPEARANCEUR HAZY (A) 10/23/2016 1142   LABSPEC 1.031 (H) 10/23/2016 1142   PHURINE 5.0 10/23/2016 1142   GLUCOSEU NEGATIVE 10/23/2016 1142   HGBUR NEGATIVE 10/23/2016 1142   BILIRUBINUR NEGATIVE 10/23/2016 1142   KETONESUR 20 (A) 10/23/2016 1142   PROTEINUR 30 (A) 10/23/2016 1142   UROBILINOGEN 0.2 04/15/2008 1541   NITRITE NEGATIVE 10/23/2016 1142   LEUKOCYTESUR NEGATIVE 10/23/2016 1142   Sepsis Labs: @LABRCNTIP (procalcitonin:4,lacticidven:4)  )No results found for this or any previous visit (from the past 240 hour(s)).    Radiology Studies: Ct Abdomen Pelvis W Contrast  Result Date: 10/23/2016 CLINICAL DATA:  Lower abdominal pain with nausea EXAM: CT ABDOMEN AND PELVIS WITH CONTRAST TECHNIQUE: Multidetector CT imaging of the abdomen and pelvis was performed using the standard protocol following bolus administration of intravenous contrast. CONTRAST:  ISOVUE-300 IOPAMIDOL (ISOVUE-300) INJECTION 61% COMPARISON:  10/22/2016 FINDINGS: Lower chest: Mild dependent atelectatic changes are noted. No focal infiltrate is seen. Hepatobiliary: No focal liver abnormality is seen. No gallstones, gallbladder wall thickening, or biliary dilatation. Mild perihepatic fluid is seen. Pancreas:  Pancreas is within normal limits. Spleen: The spleen itself is within normal limits. Mild perisplenic fluid is noted which  was not well appreciated on the prior exam. Adrenals/Urinary Tract: Kidneys demonstrate a normal enhancement pattern with the exception of left renal cyst which is stable. The adrenal glands are within normal limits. The bladder is partially distended. Stomach/Bowel: The appendix is well visualized and within normal limits. There are multiple dilated loops of small bowel which have progressed in the interval from the prior exam. There is some fecalization of small bowel contents noted within a few of the loops identified. A transition zone is noted deep within the right hemipelvis best seen on image number 68 of series 2. Adjacent to this area there is a soft tissue density with calcifications within. It has a somewhat spiculated appearance it is better visualized on the current exam due to the dilated loops of adjacent small bowel. Although this may represent some simple scarring the possibility of an underlying more aggressive process such as carcinoid could not be  totally excluded. The stomach and colon are otherwise within normal limits. Vascular/Lymphatic: Aortic atherosclerosis. No enlarged abdominal or pelvic lymph nodes. Reproductive: Status post hysterectomy. No adnexal masses. Other: Free pelvic fluid and free abdominal fluid is noted. This is likely related to the obstructive process. No findings to suggest acute perforation are noted at this time. Musculoskeletal: Bony structures are stable in appearance without acute abnormality. IMPRESSION: Spiculated soft tissue density in the small bowel mesenteric which measures approximately 2.7 x 1.4 cm in greatest dimension. It has central calcifications and contributes to the abrupt caliber change of the distal small bowel as previously described. Although this may represent some postoperative scarring as it is adjacent to the hysterectomy site the possibility of a more aggressive process such as carcinoid deserves consideration as well. Surgical consultation is  recommended. New free fluid in the pelvis and abdomen when compared with the prior exam. This is likely related to the underlying obstructive process. No definitive area of perforation is noted. No free air is seen. Normal-appearing appendix. The remainder of the exam is stable from the prior study. Electronically Signed   By: Alcide CleverMark  Lukens M.D.   On: 10/23/2016 17:29   Dg Abd Portable 1v-small Bowel Protocol-position Verification  Result Date: 10/24/2016 CLINICAL DATA:  Nasogastric tube placement EXAM: PORTABLE ABDOMEN - 1 VIEW COMPARISON:  CT abdomen and pelvis October 23, 2016 FINDINGS: Nasogastric tube tip and side port are in the stomach. There are several loops of mildly dilated bowel. There is moderate stool in the colon. No free air. Visualized lung regions are clear. IMPRESSION: Nasogastric tube tip and side port in stomach. There remain loops of dilated bowel consistent with a degree of residual obstruction. No free air. Electronically Signed   By: Bretta BangWilliam  Woodruff III M.D.   On: 10/24/2016 13:20     Scheduled Meds: . enoxaparin (LOVENOX) injection  40 mg Subcutaneous Q24H  . famotidine (PEPCID) IV  20 mg Intravenous Q12H  . latanoprost  1 drop Right Eye QHS  . levothyroxine  37.5 mcg Intravenous Daily   Continuous Infusions: . dextrose 5 % and 0.45 % NaCl with KCl 20 mEq/L 100 mL/hr at 10/24/16 1740     LOS: 1 day    Time spent: 30 min    Renae FickleSHORT, Johnna Bollier, MD [facility] Pager [phone]  If 7PM-7AM, please contact night-coverage [email or web address] Password [email or web address] 10/24/2016, 7:04 PM

## 2016-10-24 NOTE — Progress Notes (Signed)
Patient ID: Debbie Brandt, female   DOB: Jan 21, 1961, 56 y.o.   MRN: [id number]  [facility] Progress Note     Subjective: Patient admitted over night with SBO. No prior history of SBO. Surgical history includes c. Section and hysterectomy. States that she started having abdominal pain, nausea, and vomiting this past Monday. Seen in ED 2/6 where workup was negative. Symptoms persisted therefore she returned to the ED and CT scan now showed SBO. States that her last BM was 2/6. She is not passing flatus. Pain is somewhat better since admission, and she is no longer nauseated.    Works as a Production designer, theatre/television/film at [employer].  Objective: Vital signs in last 24 hours: Temp:  [98.4 F (36.9 C)-99.8 F (37.7 C)] 99.3 F (37.4 C) (02/08 0517) Pulse Rate:  [57-65] 57 (02/08 0517) Resp:  [15-18] 15 (02/08 0517) BP: (139-164)/(58-68) 139/64 (02/08 0517) SpO2:  [95 %-100 %] 98 % (02/08 0517) Weight:  [165 lb (74.8 kg)] 165 lb (74.8 kg) (02/07 1136) Last BM Date: 10/22/16  Intake/Output from previous day: 02/07 0701 - 02/08 0700 In: 1390 [I.V.:1075; IV Piggyback:315] Out: -  Intake/Output this shift: No intake/output data recorded.  PE: Gen:  Alert, NAD, pleasant Card:  RRR, no M/G/R heard Pulm:  CTAB, no W/R/R, effort normal Abd: Soft, mild distension, +BS, no HSM, mild right sided abdominal tenderness Ext:  No erythema, edema, or tenderness   Lab Results:   Recent Labs  10/23/16 1450 10/24/16 0516  WBC 10.6* 7.5  HGB 13.4 12.4  HCT 40.5 38.3  PLT 397 390   BMET  Recent Labs  10/23/16 1450 10/24/16 0516  NA 138 144  K 3.2* 3.5  CL 102 110  CO2 29 28  GLUCOSE 107* 102*  BUN 12 13  CREATININE 0.78 0.84  CALCIUM 9.1 8.6*   PT/INR  Recent Labs  10/24/16 0516  LABPROT 14.7  INR 1.15   CMP     Component Value Date/Time   NA 144 10/24/2016 0516   K 3.5 10/24/2016 0516   CL 110 10/24/2016 0516   CO2 28 10/24/2016 0516   GLUCOSE 102 (H) 10/24/2016  0516   BUN 13 10/24/2016 0516   CREATININE 0.84 10/24/2016 0516   CREATININE 0.83 09/26/2015 0955   CALCIUM 8.6 (L) 10/24/2016 0516   PROT 7.3 10/23/2016 1450   ALBUMIN 4.1 10/23/2016 1450   AST 20 10/23/2016 1450   ALT 20 10/23/2016 1450   ALKPHOS 58 10/23/2016 1450   BILITOT 0.6 10/23/2016 1450   GFRNONAA >60 10/24/2016 0516   GFRAA >60 10/24/2016 0516   Lipase     Component Value Date/Time   LIPASE 18 10/23/2016 1450       Studies/Results: Ct Abdomen Pelvis W Contrast  Result Date: 10/23/2016 CLINICAL DATA:  Lower abdominal pain with nausea EXAM: CT ABDOMEN AND PELVIS WITH CONTRAST TECHNIQUE: Multidetector CT imaging of the abdomen and pelvis was performed using the standard protocol following bolus administration of intravenous contrast. CONTRAST:  ISOVUE-300 IOPAMIDOL (ISOVUE-300) INJECTION 61% COMPARISON:  10/22/2016 FINDINGS: Lower chest: Mild dependent atelectatic changes are noted. No focal infiltrate is seen. Hepatobiliary: No focal liver abnormality is seen. No gallstones, gallbladder wall thickening, or biliary dilatation. Mild perihepatic fluid is seen. Pancreas:  Pancreas is within normal limits. Spleen: The spleen itself is within normal limits. Mild perisplenic fluid is noted which was not well appreciated on the prior exam. Adrenals/Urinary Tract: Kidneys demonstrate a normal enhancement pattern with the exception  of left renal cyst which is stable. The adrenal glands are within normal limits. The bladder is partially distended. Stomach/Bowel: The appendix is well visualized and within normal limits. There are multiple dilated loops of small bowel which have progressed in the interval from the prior exam. There is some fecalization of small bowel contents noted within a few of the loops identified. A transition zone is noted deep within the right hemipelvis best seen on image number 68 of series 2. Adjacent to this area there is a soft tissue density with  calcifications within. It has a somewhat spiculated appearance it is better visualized on the current exam due to the dilated loops of adjacent small bowel. Although this may represent some simple scarring the possibility of an underlying more aggressive process such as carcinoid could not be totally excluded. The stomach and colon are otherwise within normal limits. Vascular/Lymphatic: Aortic atherosclerosis. No enlarged abdominal or pelvic lymph nodes. Reproductive: Status post hysterectomy. No adnexal masses. Other: Free pelvic fluid and free abdominal fluid is noted. This is likely related to the obstructive process. No findings to suggest acute perforation are noted at this time. Musculoskeletal: Bony structures are stable in appearance without acute abnormality. IMPRESSION: Spiculated soft tissue density in the small bowel mesenteric which measures approximately 2.7 x 1.4 cm in greatest dimension. It has central calcifications and contributes to the abrupt caliber change of the distal small bowel as previously described. Although this may represent some postoperative scarring as it is adjacent to the hysterectomy site the possibility of a more aggressive process such as carcinoid deserves consideration as well. Surgical consultation is recommended. New free fluid in the pelvis and abdomen when compared with the prior exam. This is likely related to the underlying obstructive process. No definitive area of perforation is noted. No free air is seen. Normal-appearing appendix. The remainder of the exam is stable from the prior study. Electronically Signed   By: Alcide Clever M.D.   On: 10/23/2016 17:29   Ct Abdomen Pelvis W Contrast  Result Date: 10/22/2016 CLINICAL DATA:  Lower abdominal and pelvic pain beginning last night. EXAM: CT ABDOMEN AND PELVIS WITH CONTRAST TECHNIQUE: Multidetector CT imaging of the abdomen and pelvis was performed using the standard protocol following bolus administration of  intravenous contrast. CONTRAST:  100 ml ISOVUE-300 IOPAMIDOL (ISOVUE-300) INJECTION 61% COMPARISON:  Plain films chest and abdomen 03/09/2016. FINDINGS: Lower chest: Mild dependent atelectasis is seen in the lung bases. No pleural or pericardial effusion. Hepatobiliary: No focal liver abnormality is seen. No gallstones, gallbladder wall thickening, or biliary dilatation. Pancreas: Unremarkable. No pancreatic ductal dilatation or surrounding inflammatory changes. Spleen: Normal in size without focal abnormality. Adrenals/Urinary Tract: The adrenal glands appear normal. 2.1 cm simple left renal cyst is identified. Kidneys otherwise appear normal. Ureters and urinary bladder appear normal. Stomach/Bowel: Stomach is within normal limits. Appendix appears normal. No evidence of bowel wall thickening, distention, or inflammatory changes. Vascular/Lymphatic: No significant vascular findings are present. No enlarged abdominal or pelvic lymph nodes. Reproductive: Status post hysterectomy. No adnexal masses. Other: No abdominal wall hernia or abnormality. No abdominopelvic ascites. Musculoskeletal: Loss of disc space height and endplate sclerosis are seen at L5-S1. Small bone island left ilium noted. No worrisome bone lesion. IMPRESSION: No acute abnormality abdomen or pelvis. Degenerative disc disease L5-S1. Status post hysterectomy. Electronically Signed   By: Drusilla Kanner M.D.   On: 10/22/2016 11:20    Anti-infectives: Anti-infectives    None       Assessment/Plan  SBO likely 2/2 adhesions - prior abdominal surgical history includes c. section and hysterectomy - progressive abdominal pain, nausea, and vomiting that began 2/5 - CT shows possible bowel obstruction with a possible transition point in the pelvis, possible postoperative scarring versus carcinoid  HTN Hypothyroidism GERD  ID - none VTE - SCDs, lovenox FEN - IVF, NPO/NG tube  Plan - Plan to start patient on small bowel protocol with NG  tube and gastrograffin with delayed abdominal XR. Continue NPO. Encourage mobilization today.    LOS: 1 day    Edson SnowballBROOKE A MILLER , [facility] 10/24/2016, 9:23 AM Pager: [phone] Consults: [phone] Mon-Fri 7:00 am-4:30 pm Sat-Sun 7:00 am-11:30 am

## 2016-10-25 ENCOUNTER — Inpatient Hospital Stay (HOSPITAL_COMMUNITY): Payer: Managed Care, Other (non HMO)

## 2016-10-25 LAB — BASIC METABOLIC PANEL
ANION GAP: 6 (ref 5–15)
BUN: 10 mg/dL (ref 6–20)
CO2: 28 mmol/L (ref 22–32)
Calcium: 8.5 mg/dL — ABNORMAL LOW (ref 8.9–10.3)
Chloride: 108 mmol/L (ref 101–111)
Creatinine, Ser: 0.85 mg/dL (ref 0.44–1.00)
GLUCOSE: 140 mg/dL — AB (ref 65–99)
POTASSIUM: 3.9 mmol/L (ref 3.5–5.1)
Sodium: 142 mmol/L (ref 135–145)

## 2016-10-25 LAB — CBC
HCT: 41.2 % (ref 36.0–46.0)
Hemoglobin: 13.4 g/dL (ref 12.0–15.0)
MCH: 27.7 pg (ref 26.0–34.0)
MCHC: 32.5 g/dL (ref 30.0–36.0)
MCV: 85.1 fL (ref 78.0–100.0)
Platelets: 394 10*3/uL (ref 150–400)
RBC: 4.84 MIL/uL (ref 3.87–5.11)
RDW: 14.2 % (ref 11.5–15.5)
WBC: 8.1 10*3/uL (ref 4.0–10.5)

## 2016-10-25 MED ORDER — HYDRALAZINE HCL 20 MG/ML IJ SOLN: 10.0000 mg | Freq: Four times a day (QID) | INTRAMUSCULAR | Status: DC | PRN

## 2016-10-25 MED ORDER — LIP MEDEX EX OINT
TOPICAL_OINTMENT | CUTANEOUS | Status: DC | PRN
  Administered 2016-10-25: 1 via TOPICAL
  Filled 2016-10-25: qty 7

## 2016-10-25 NOTE — Progress Notes (Signed)
Patient ID: Debbie FlemingsCynthia L Tremain, female   DOB: 1960-11-25, 56 y.o.   MRN: [id number]  [facility]     Subjective: Patient reports that her abdominal pain has resolved, but she is not yet passing flatus. Ambulated a few times yesterday. Denies n/v.  Objective: Vital signs in last 24 hours: Temp:  [98 F (36.7 C)-98.5 F (36.9 C)] 98.3 F (36.8 C) (02/09 0538) Pulse Rate:  [58-61] 60 (02/09 0615) Resp:  [14-15] 15 (02/09 0538) BP: (120-172)/(55-76) 120/70 (02/09 0615) SpO2:  [99 %-100 %] 99 % (02/09 0538) Last BM Date: 10/22/16  Intake/Output from previous day: 02/08 0701 - 02/09 0700 In: 2765 [I.V.:2575; NG/GT:40; IV Piggyback:150] Out: 1210 [Urine:550; Emesis/NG output:660] Intake/Output this shift: No intake/output data recorded.  PE: Gen:  Alert, NAD, pleasant Card:  RRR, no M/G/R heard Pulm:  CTAB, no W/R/R, effort normal Abd: Soft, mild distension, +BS, no HSM, nontender Ext:  No erythema, edema, or tenderness    Lab Results:   Recent Labs  10/24/16 0516 10/25/16 0518  WBC 7.5 8.1  HGB 12.4 13.4  HCT 38.3 41.2  PLT 390 394   BMET  Recent Labs  10/24/16 0516 10/25/16 0518  NA 144 142  K 3.5 3.9  CL 110 108  CO2 28 28  GLUCOSE 102* 140*  BUN 13 10  CREATININE 0.84 0.85  CALCIUM 8.6* 8.5*   PT/INR  Recent Labs  10/24/16 0516  LABPROT 14.7  INR 1.15   CMP     Component Value Date/Time   NA 142 10/25/2016 0518   K 3.9 10/25/2016 0518   CL 108 10/25/2016 0518   CO2 28 10/25/2016 0518   GLUCOSE 140 (H) 10/25/2016 0518   BUN 10 10/25/2016 0518   CREATININE 0.85 10/25/2016 0518   CREATININE 0.83 09/26/2015 0955   CALCIUM 8.5 (L) 10/25/2016 0518   PROT 7.3 10/23/2016 1450   ALBUMIN 4.1 10/23/2016 1450   AST 20 10/23/2016 1450   ALT 20 10/23/2016 1450   ALKPHOS 58 10/23/2016 1450   BILITOT 0.6 10/23/2016 1450   GFRNONAA >60 10/25/2016 0518   GFRAA >60 10/25/2016 0518   Lipase     Component Value Date/Time    LIPASE 18 10/23/2016 1450       Studies/Results: Ct Abdomen Pelvis W Contrast  Result Date: 10/23/2016 CLINICAL DATA:  Lower abdominal pain with nausea EXAM: CT ABDOMEN AND PELVIS WITH CONTRAST TECHNIQUE: Multidetector CT imaging of the abdomen and pelvis was performed using the standard protocol following bolus administration of intravenous contrast. CONTRAST:  100mL ISOVUE-300 IOPAMIDOL (ISOVUE-300) INJECTION 61% COMPARISON:  10/22/2016 FINDINGS: Lower chest: Mild dependent atelectatic changes are noted. No focal infiltrate is seen. Hepatobiliary: No focal liver abnormality is seen. No gallstones, gallbladder wall thickening, or biliary dilatation. Mild perihepatic fluid is seen. Pancreas:  Pancreas is within normal limits. Spleen: The spleen itself is within normal limits. Mild perisplenic fluid is noted which was not well appreciated on the prior exam. Adrenals/Urinary Tract: Kidneys demonstrate a normal enhancement pattern with the exception of left renal cyst which is stable. The adrenal glands are within normal limits. The bladder is partially distended. Stomach/Bowel: The appendix is well visualized and within normal limits. There are multiple dilated loops of small bowel which have progressed in the interval from the prior exam. There is some fecalization of small bowel contents noted within a few of the loops identified. A transition zone is noted deep within the right hemipelvis best seen on image number  68 of series 2. Adjacent to this area there is a soft tissue density with calcifications within. It has a somewhat spiculated appearance it is better visualized on the current exam due to the dilated loops of adjacent small bowel. Although this may represent some simple scarring the possibility of an underlying more aggressive process such as carcinoid could not be totally excluded. The stomach and colon are otherwise within normal limits. Vascular/Lymphatic: Aortic atherosclerosis. No enlarged  abdominal or pelvic lymph nodes. Reproductive: Status post hysterectomy. No adnexal masses. Other: Free pelvic fluid and free abdominal fluid is noted. This is likely related to the obstructive process. No findings to suggest acute perforation are noted at this time. Musculoskeletal: Bony structures are stable in appearance without acute abnormality. IMPRESSION: Spiculated soft tissue density in the small bowel mesenteric which measures approximately 2.7 x 1.4 cm in greatest dimension. It has central calcifications and contributes to the abrupt caliber change of the distal small bowel as previously described. Although this may represent some postoperative scarring as it is adjacent to the hysterectomy site the possibility of a more aggressive process such as carcinoid deserves consideration as well. Surgical consultation is recommended. New free fluid in the pelvis and abdomen when compared with the prior exam. This is likely related to the underlying obstructive process. No definitive area of perforation is noted. No free air is seen. Normal-appearing appendix. The remainder of the exam is stable from the prior study. Electronically Signed   By: Alcide Clever M.D.   On: 10/23/2016 17:29   Dg Abd Portable 1v-small Bowel Obstruction Protocol-initial, 8 Hr Delay  Result Date: 10/25/2016 CLINICAL DATA:  Small-bowel protocol.  8 hour delayed film. EXAM: PORTABLE ABDOMEN - 1 VIEW COMPARISON:  10/24/2016 FINDINGS: Enteric tube is present with tip extending to the level of the distal stomach and coiled back into the proximal stomach. Positioning is unchanged. Gas-filled distended small bowel consistent with small bowel obstruction. There appears to be a small amount of dilute contrast material in the small bowel. No contrast material is demonstrated in the colon. Degenerative changes in the spine. IMPRESSION: Enteric tube is coiled in the expected location of the stomach. Gaseous distention of small bowel consistent with  obstruction. No contrast material identified in the colon. Electronically Signed   By: Burman Nieves M.D.   On: 10/25/2016 01:43   Dg Abd Portable 1v-small Bowel Protocol-position Verification  Result Date: 10/24/2016 CLINICAL DATA:  Nasogastric tube placement EXAM: PORTABLE ABDOMEN - 1 VIEW COMPARISON:  CT abdomen and pelvis October 23, 2016 FINDINGS: Nasogastric tube tip and side port are in the stomach. There are several loops of mildly dilated bowel. There is moderate stool in the colon. No free air. Visualized lung regions are clear. IMPRESSION: Nasogastric tube tip and side port in stomach. There remain loops of dilated bowel consistent with a degree of residual obstruction. No free air. Electronically Signed   By: Bretta Bang III M.D.   On: 10/24/2016 13:20    Anti-infectives: Anti-infectives    None       Assessment/Plan SBO likely 2/2 adhesions - prior abdominal surgical history includes c. section and hysterectomy - progressive abdominal pain, nausea, and vomiting that began 2/5 - CT shows possible bowel obstruction with a possible transition point in the pelvis, possible postoperative scarring versus carcinoid - NG placed 2/8, 660cc/24hr - XR this AM showed persistent obstruction, no contrast in colon  HTN Hypothyroidism GERD  ID - none VTE - SCDs, lovenox FEN - IVF,  NPO/NG tube  Plan - XR showed no contrast in colon. Patient not passing flatus but abdominal pain has improved. Encouraged patient to ambulate frequently, at least every 4 hours, today. We will repeat XR in the Am. Labs in AM    LOS: 2 days    Edson Snowball , [facility] 10/25/2016, 8:35 AM Pager: [phone] Consults: [phone] Mon-Fri 7:00 am-4:30 pm Sat-Sun 7:00 am-11:30 am

## 2016-10-25 NOTE — Progress Notes (Signed)
Pt has bp 172/76. Administered 20 mg Hydralazine, it reduced b; to 120 /70.

## 2016-10-25 NOTE — Progress Notes (Addendum)
PROGRESS NOTE  Debbie FlemingsCynthia L Brandt  ZOX:[id number] DOB: 05-Feb-1961 DOA: 10/23/2016 PCP: Debbie Brandt  Brief Narrative:   The patient is a 56 year old female with history of hypertension, hypothyroidism, GERD who presented with abdominal pain that was 10 out of 10, sharp and associated with nausea and vomiting. Abdominal CT demonstrated evidence of a small bowel obstruction with a 2.7 cm soft tissue density of unclear etiology.  General surgery is consulted.  Assessment & Plan:   Principal Problem:   Small bowel obstruction Active Problems:   HTN (hypertension)   Hypothyroidism   Abdominal mass   Hypokalemia  Small bowel obstruction and possible abdominal mass, having increased nausea and abdominal pain --NPO  -  Continue NG tube --General surgery assistance appreciated --continue dextrose containing IV fluids --Advised patient to minimize use of IV narcotics -  Encouraged ambulation  HTN --IV hydralazine prn  Hypothyroidism --IV levothyroxine (half oral dose)  GERD --IV protonix 20mg  BID  Hypokalemia --Replacement ordered  Other --travatan (or formulary equivalent) to right eye daily   DVT prophylaxis: Lovenox Code Status: FULL Family Communication:  no family present at bedside  Disposition Plan:  anticipate home in a few days, she requires surgery to relieve her small bowel obstruction  Consultants:   General surgery  Procedures:  None  Antimicrobials:  Anti-infectives    None       Subjective: Nausea and 10/10 abdominal pain at the time of my visit.  No flatus or BMs.    Objective: Vitals:   10/24/16 2147 10/25/16 0538 10/25/16 0615 10/25/16 1444  BP: (!) 153/65 (!) 172/76 120/70 (!) 146/49  Pulse: (!) 58 60 60 72  Resp: 14 15  16   Temp: 98.5 F (36.9 C) 98.3 F (36.8 C)  98.5 F (36.9 C)  TempSrc: Oral Oral  Oral  SpO2: 100% 99%  98%  Weight:      Height:        Intake/Output Summary (Last 24 hours) at 10/25/16 1715 Last  data filed at 10/25/16 1539  Gross per 24 hour  Intake             1840 ml  Output             1385 ml  Net              455 ml   Filed Weights   10/23/16 1136  Weight: 74.8 kg (165 lb)    Examination:  General exam:  Adult Female.  Heaving.  HEENT:  NCAT, MMM Respiratory system: Clear to auscultation bilaterally Cardiovascular system: Regular rate and rhythm, normal S1/S2. No murmurs, rubs, gallops or clicks.  Warm extremities Gastrointestinal system: Hypoactive and slightly higher pitched bowel sounds, painful distended bowel loop just to the left and below the umbilicus.   MSK:  Normal tone and bulk, no lower extremity edema Neuro:  Grossly intact    Data Reviewed: I have personally reviewed following labs and imaging studies  CBC:  Recent Labs Lab 10/22/16 0741 10/23/16 1450 10/24/16 0516 10/25/16 0518  WBC 6.5 10.6* 7.5 8.1  NEUTROABS 5.8 8.7*  --   --   HGB 11.8* 13.4 12.4 13.4  HCT 36.3 40.5 38.3 41.2  MCV 82.9 81.2 84.2 85.1  PLT 377 397 390 394   Basic Metabolic Panel:  Recent Labs Lab 10/22/16 0741 10/23/16 1450 10/24/16 0516 10/25/16 0518  NA 140 138 144 142  K 3.6 3.2* 3.5 3.9  CL 105 102 110 108  CO2 27 29  28 28  GLUCOSE 127* 107* 102* 140*  BUN 19 12 13 10   CREATININE 0.85 0.78 0.84 0.85  CALCIUM 9.2 9.1 8.6* 8.5*   GFR: Estimated Creatinine Clearance: 69.2 mL/min (by C-G formula based on SCr of 0.85 mg/dL). Liver Function Tests:  Recent Labs Lab 10/22/16 0741 10/23/16 1450  AST 22 20  ALT 20 20  ALKPHOS 69 58  BILITOT 0.4 0.6  PROT 7.1 7.3  ALBUMIN 4.1 4.1    Recent Labs Lab 10/22/16 0741 10/23/16 1450  LIPASE 26 18   No results for input(s): AMMONIA in the last 168 hours. Coagulation Profile:  Recent Labs Lab 10/24/16 0516  INR 1.15   Cardiac Enzymes: No results for input(s): CKTOTAL, CKMB, CKMBINDEX, TROPONINI in the last 168 hours. BNP (last 3 results) No results for input(s): PROBNP in the last 8760  hours. HbA1C: No results for input(s): HGBA1C in the last 72 hours. CBG: No results for input(s): GLUCAP in the last 168 hours. Lipid Profile: No results for input(s): CHOL, HDL, LDLCALC, TRIG, CHOLHDL, LDLDIRECT in the last 72 hours. Thyroid Function Tests: No results for input(s): TSH, T4TOTAL, FREET4, T3FREE, THYROIDAB in the last 72 hours. Anemia Panel: No results for input(s): VITAMINB12, FOLATE, FERRITIN, TIBC, IRON, RETICCTPCT in the last 72 hours. Urine analysis:    Component Value Date/Time   COLORURINE YELLOW 10/23/2016 1142   APPEARANCEUR HAZY (A) 10/23/2016 1142   LABSPEC 1.031 (H) 10/23/2016 1142   PHURINE 5.0 10/23/2016 1142   GLUCOSEU NEGATIVE 10/23/2016 1142   HGBUR NEGATIVE 10/23/2016 1142   BILIRUBINUR NEGATIVE 10/23/2016 1142   KETONESUR 20 (A) 10/23/2016 1142   PROTEINUR 30 (A) 10/23/2016 1142   UROBILINOGEN 0.2 04/15/2008 1541   NITRITE NEGATIVE 10/23/2016 1142   LEUKOCYTESUR NEGATIVE 10/23/2016 1142   Sepsis Labs: @LABRCNTIP (procalcitonin:4,lacticidven:4)  )No results found for this or any previous visit (from the past 240 hour(s)).    Radiology Studies: Dg Abd Portable 1v-small Bowel Obstruction Protocol-initial, 8 Hr Delay  Result Date: 10/25/2016 CLINICAL DATA:  Small-bowel protocol.  8 hour delayed film. EXAM: PORTABLE ABDOMEN - 1 VIEW COMPARISON:  10/24/2016 FINDINGS: Enteric tube is present with tip extending to the level of the distal stomach and coiled back into the proximal stomach. Positioning is unchanged. Gas-filled distended small bowel consistent with small bowel obstruction. There appears to be a small amount of dilute contrast material in the small bowel. No contrast material is demonstrated in the colon. Degenerative changes in the spine. IMPRESSION: Enteric tube is coiled in the expected location of the stomach. Gaseous distention of small bowel consistent with obstruction. No contrast material identified in the colon. Electronically Signed    By: Burman Nieves M.D.   On: 10/25/2016 01:43   Dg Abd Portable 1v-small Bowel Protocol-position Verification  Result Date: 10/24/2016 CLINICAL DATA:  Nasogastric tube placement EXAM: PORTABLE ABDOMEN - 1 VIEW COMPARISON:  CT abdomen and pelvis October 23, 2016 FINDINGS: Nasogastric tube tip and side port are in the stomach. There are several loops of mildly dilated bowel. There is moderate stool in the colon. No free air. Visualized lung regions are clear. IMPRESSION: Nasogastric tube tip and side port in stomach. There remain loops of dilated bowel consistent with a degree of residual obstruction. No free air. Electronically Signed   By: Bretta Bang III M.D.   On: 10/24/2016 13:20     Scheduled Meds: . enoxaparin (LOVENOX) injection  40 mg Subcutaneous Q24H  . famotidine (PEPCID) IV  20 mg Intravenous Q12H  .  latanoprost  1 drop Right Eye QHS  . levothyroxine  37.5 mcg Intravenous Daily   Continuous Infusions: . dextrose 5 % and 0.45 % NaCl with KCl 20 mEq/L 100 mL/hr at 10/25/16 1007     LOS: 2 days    Time spent: 30 min    Renae Fickle, Brandt [facility] Pager [phone]  If 7PM-7AM, please contact night-coverage [email or web address] Password [email or web address] 10/25/2016, 5:15 PM

## 2016-10-26 ENCOUNTER — Inpatient Hospital Stay (HOSPITAL_COMMUNITY): Payer: Managed Care, Other (non HMO)

## 2016-10-26 LAB — CBC
HCT: 40.5 % (ref 36.0–46.0)
HEMOGLOBIN: 13 g/dL (ref 12.0–15.0)
MCH: 26.6 pg (ref 26.0–34.0)
MCHC: 32.1 g/dL (ref 30.0–36.0)
MCV: 82.8 fL (ref 78.0–100.0)
Platelets: 412 10*3/uL — ABNORMAL HIGH (ref 150–400)
RBC: 4.89 MIL/uL (ref 3.87–5.11)
RDW: 14 % (ref 11.5–15.5)
WBC: 6.9 10*3/uL (ref 4.0–10.5)

## 2016-10-26 LAB — BASIC METABOLIC PANEL
ANION GAP: 5 (ref 5–15)
BUN: 11 mg/dL (ref 6–20)
CALCIUM: 8.6 mg/dL — AB (ref 8.9–10.3)
CO2: 28 mmol/L (ref 22–32)
Chloride: 108 mmol/L (ref 101–111)
Creatinine, Ser: 0.9 mg/dL (ref 0.44–1.00)
Glucose, Bld: 115 mg/dL — ABNORMAL HIGH (ref 65–99)
Potassium: 3.9 mmol/L (ref 3.5–5.1)
SODIUM: 141 mmol/L (ref 135–145)

## 2016-10-26 NOTE — Progress Notes (Signed)
  Subjective: No complaints. Belly feels better but no flatus yet  Objective: Vital signs in last 24 hours: Temp:  [98.5 F (36.9 C)-99.7 F (37.6 C)] 98.5 F (36.9 C) (02/10 0555) Pulse Rate:  [67-72] 71 (02/10 0555) Resp:  [16-18] 18 (02/10 0555) BP: (146-151)/(49-69) 151/69 (02/10 0555) SpO2:  [97 %-99 %] 97 % (02/10 0555) Last BM Date: 10/22/16  Intake/Output from previous day: 02/09 0701 - 02/10 0700 In: 2570 [I.V.:2400; NG/GT:120; IV Piggyback:50] Out: 1735 [Urine:950; Emesis/NG output:785] Intake/Output this shift: No intake/output data recorded.  Resp: clear to auscultation bilaterally Cardio: regular rate and rhythm GI: soft, nontender. few bs. no flatus  Lab Results:   Recent Labs  10/25/16 0518 10/26/16 0514  WBC 8.1 6.9  HGB 13.4 13.0  HCT 41.2 40.5  PLT 394 412*   BMET  Recent Labs  10/25/16 0518 10/26/16 0514  NA 142 141  K 3.9 3.9  CL 108 108  CO2 28 28  GLUCOSE 140* 115*  BUN 10 11  CREATININE 0.85 0.90  CALCIUM 8.5* 8.6*   PT/INR  Recent Labs  10/24/16 0516  LABPROT 14.7  INR 1.15   ABG No results for input(s): PHART, HCO3 in the last 72 hours.  Invalid input(s): PCO2, PO2  Studies/Results: Dg Abd 2 Views  Result Date: 10/26/2016 CLINICAL DATA:  Abdominal pain.  Follow-up small bowel obstruction EXAM: ABDOMEN - 2 VIEW COMPARISON:  10/25/2016 FINDINGS: NG tube remains coiled in the stomach. Continued dilated small bowel loops, not significantly changed. No free air organomegaly. IMPRESSION: Stable small bowel obstruction pattern. Electronically Signed   By: Charlett NoseKevin  Dover M.D.   On: 10/26/2016 08:47   Dg Abd Portable 1v-small Bowel Obstruction Protocol-initial, 8 Hr Delay  Result Date: 10/25/2016 CLINICAL DATA:  Small-bowel protocol.  8 hour delayed film. EXAM: PORTABLE ABDOMEN - 1 VIEW COMPARISON:  10/24/2016 FINDINGS: Enteric tube is present with tip extending to the level of the distal stomach and coiled back into the proximal  stomach. Positioning is unchanged. Gas-filled distended small bowel consistent with small bowel obstruction. There appears to be a small amount of dilute contrast material in the small bowel. No contrast material is demonstrated in the colon. Degenerative changes in the spine. IMPRESSION: Enteric tube is coiled in the expected location of the stomach. Gaseous distention of small bowel consistent with obstruction. No contrast material identified in the colon. Electronically Signed   By: Burman NievesWilliam  Stevens M.D.   On: 10/25/2016 01:43   Dg Abd Portable 1v-small Bowel Protocol-position Verification  Result Date: 10/24/2016 CLINICAL DATA:  Nasogastric tube placement EXAM: PORTABLE ABDOMEN - 1 VIEW COMPARISON:  CT abdomen and pelvis October 23, 2016 FINDINGS: Nasogastric tube tip and side port are in the stomach. There are several loops of mildly dilated bowel. There is moderate stool in the colon. No free air. Visualized lung regions are clear. IMPRESSION: Nasogastric tube tip and side port in stomach. There remain loops of dilated bowel consistent with a degree of residual obstruction. No free air. Electronically Signed   By: Bretta BangWilliam  Woodruff III M.D.   On: 10/24/2016 13:20    Anti-infectives: Anti-infectives    None      Assessment/Plan: s/p * No surgery found * Continue ng and bowel rest  abd xray unchanged. Will repeat tomorrow ambulate  LOS: 3 days    TOTH III,PAUL S 10/26/2016

## 2016-10-27 ENCOUNTER — Inpatient Hospital Stay (HOSPITAL_COMMUNITY): Payer: Managed Care, Other (non HMO)

## 2016-10-27 LAB — BASIC METABOLIC PANEL
ANION GAP: 4 — AB (ref 5–15)
BUN: 9 mg/dL (ref 6–20)
CALCIUM: 8.2 mg/dL — AB (ref 8.9–10.3)
CO2: 28 mmol/L (ref 22–32)
CREATININE: 0.82 mg/dL (ref 0.44–1.00)
Chloride: 107 mmol/L (ref 101–111)
Glucose, Bld: 129 mg/dL — ABNORMAL HIGH (ref 65–99)
Potassium: 3.6 mmol/L (ref 3.5–5.1)
SODIUM: 139 mmol/L (ref 135–145)

## 2016-10-27 NOTE — Progress Notes (Signed)
General Surgery [facility]  Assessment & Plan:  Small bowel obstruction  AXR this AM little changed  No flatus or BM overnight  Continue NG decompression  Ambulating in halls  IV hydration, NPO  Repeat AXR in AM 2/12  Discussed potential need for operative intervention due to failure to progress.  Patient does not want any surgery today.  Encouraged ambulation, up to chair.  Will repeat AXR in AM 2/12.  If no progress, will likely require laparoscopy/lapartomy for lysis of adhesions.        Velora Heckler, MD, [facility]       Office: [phone]    Subjective: Patient in bed, comfortable, some throat pain.  Denies abdominal pain.  No nausea or emesis.  No flatus or BM.  Objective: Vital signs in last 24 hours: Temp:  [98.8 F (37.1 C)-99.7 F (37.6 C)] 99.7 F (37.6 C) (02/11 0545) Pulse Rate:  [66-82] 82 (02/11 0545) Resp:  [16-18] 16 (02/11 0545) BP: (155-170)/(67-76) 156/76 (02/11 0545) SpO2:  [95 %-97 %] 97 % (02/11 0545) Last BM Date: 10/22/16  Intake/Output from previous day: 02/10 0701 - 02/11 0700 In: 2500 [I.V.:2400; IV Piggyback:100] Out: 2450 [Urine:1300; Emesis/NG output:1150] Intake/Output this shift: Total I/O In: 0  Out: 400 [Emesis/NG output:400]  Physical Exam: HEENT - sclerae clear, mucous membranes moist Abdomen - minimal distension, soft, non-tender; BS present Ext - no edema, non-tender Neuro - alert & oriented, no focal deficits  Lab Results:   Recent Labs  10/25/16 0518 10/26/16 0514  WBC 8.1 6.9  HGB 13.4 13.0  HCT 41.2 40.5  PLT 394 412*   BMET  Recent Labs  10/26/16 0514 10/27/16 0417  NA 141 139  K 3.9 3.6  CL 108 107  CO2 28 28  GLUCOSE 115* 129*  BUN 11 9  CREATININE 0.90 0.82  CALCIUM 8.6* 8.2*   PT/INR No results for input(s): LABPROT, INR in the last 72 hours. Comprehensive Metabolic Panel:    Component Value Date/Time   NA 139 10/27/2016 0417   NA  141 10/26/2016 0514   K 3.6 10/27/2016 0417   K 3.9 10/26/2016 0514   CL 107 10/27/2016 0417   CL 108 10/26/2016 0514   CO2 28 10/27/2016 0417   CO2 28 10/26/2016 0514   BUN 9 10/27/2016 0417   BUN 11 10/26/2016 0514   CREATININE 0.82 10/27/2016 0417   CREATININE 0.90 10/26/2016 0514   CREATININE 0.83 09/26/2015 0955   CREATININE 0.85 11/26/2014 1300   GLUCOSE 129 (H) 10/27/2016 0417   GLUCOSE 115 (H) 10/26/2016 0514   CALCIUM 8.2 (L) 10/27/2016 0417   CALCIUM 8.6 (L) 10/26/2016 0514   AST 20 10/23/2016 1450   AST 22 10/22/2016 0741   ALT 20 10/23/2016 1450   ALT 20 10/22/2016 0741   ALKPHOS 58 10/23/2016 1450   ALKPHOS 69 10/22/2016 0741   BILITOT 0.6 10/23/2016 1450   BILITOT 0.4 10/22/2016 0741   PROT 7.3 10/23/2016 1450   PROT 7.1 10/22/2016 0741   ALBUMIN 4.1 10/23/2016 1450   ALBUMIN 4.1 10/22/2016 0741    Studies/Results: Dg Abd 2 Views  Result Date: 10/27/2016 CLINICAL DATA:  Small bowel obstruction EXAM: ABDOMEN - 2 VIEW COMPARISON:  10/26/2016 FINDINGS: NG tube remains looped within the stomach with the tip in the proximal stomach. Continued gaseous distention of small bowel loops compatible with small bowel obstruction, not significantly changed. IMPRESSION: Small bowel  obstruction pattern, not significantly changed. Electronically Signed   By: Charlett NoseKevin  Dover M.D.   On: 10/27/2016 08:46   Dg Abd 2 Views  Result Date: 10/26/2016 CLINICAL DATA:  Abdominal pain.  Follow-up small bowel obstruction EXAM: ABDOMEN - 2 VIEW COMPARISON:  10/25/2016 FINDINGS: NG tube remains coiled in the stomach. Continued dilated small bowel loops, not significantly changed. No free air organomegaly. IMPRESSION: Stable small bowel obstruction pattern. Electronically Signed   By: Charlett NoseKevin  Dover M.D.   On: 10/26/2016 08:47      Alvine Mostafa M 10/27/2016  Patient ID: Sherron Flemingsynthia L Davlin, female   DOB: 01/05/1961, 56 y.o.   MRN: [id number]

## 2016-10-28 ENCOUNTER — Encounter (HOSPITAL_COMMUNITY): Admission: EM | Disposition: A | Discharge status: Home / Self Care | Payer: Self-pay | Source: Home / Self Care

## 2016-10-28 ENCOUNTER — Inpatient Hospital Stay (HOSPITAL_COMMUNITY): Payer: Managed Care, Other (non HMO)

## 2016-10-28 ENCOUNTER — Inpatient Hospital Stay (HOSPITAL_COMMUNITY): Payer: Managed Care, Other (non HMO) | Admitting: Anesthesiology

## 2016-10-28 DIAGNOSIS — K565 Intestinal adhesions [bands], unspecified as to partial versus complete obstruction: Secondary | ICD-10-CM | POA: Diagnosis present

## 2016-10-28 HISTORY — PX: LAPAROTOMY: SHX154

## 2016-10-28 LAB — BASIC METABOLIC PANEL
ANION GAP: 5 (ref 5–15)
BUN: 8 mg/dL (ref 6–20)
CHLORIDE: 105 mmol/L (ref 101–111)
CO2: 28 mmol/L (ref 22–32)
Calcium: 8.3 mg/dL — ABNORMAL LOW (ref 8.9–10.3)
Creatinine, Ser: 0.78 mg/dL (ref 0.44–1.00)
GFR calc non Af Amer: >60 mL/min (ref >60)
Glucose, Bld: 120 mg/dL — ABNORMAL HIGH (ref 65–99)
POTASSIUM: 3.6 mmol/L (ref 3.5–5.1)
Sodium: 138 mmol/L (ref 135–145)

## 2016-10-28 SURGERY — LAPAROTOMY, EXPLORATORY
Anesthesia: General | Site: Abdomen

## 2016-10-28 MED ORDER — PHENOL 1.4 % MT LIQD
1.0000 | OROMUCOSAL | Status: DC | PRN
  Filled 2016-10-28: qty 177

## 2016-10-28 MED ORDER — LIDOCAINE 2% (20 MG/ML) 5 ML SYRINGE
INTRAMUSCULAR | Status: DC | PRN
  Administered 2016-10-28: 100 mg via INTRAVENOUS

## 2016-10-28 MED ORDER — ONDANSETRON HCL 4 MG/2ML IJ SOLN
4.0000 mg | INTRAMUSCULAR | Status: DC | PRN
  Administered 2016-10-31 – 2016-11-03 (×3): 4 mg via INTRAVENOUS
  Filled 2016-10-28 (×3): qty 2

## 2016-10-28 MED ORDER — HYDROMORPHONE HCL 1 MG/ML IJ SOLN
0.2500 mg | INTRAMUSCULAR | Status: DC | PRN
  Administered 2016-10-28 (×4): 0.5 mg via INTRAVENOUS

## 2016-10-28 MED ORDER — CEFAZOLIN SODIUM-DEXTROSE 2-4 GM/100ML-% IV SOLN
INTRAVENOUS | Status: AC
  Filled 2016-10-28: qty 100

## 2016-10-28 MED ORDER — SUGAMMADEX SODIUM 200 MG/2ML IV SOLN
INTRAVENOUS | Status: DC | PRN
  Administered 2016-10-28: 200 mg via INTRAVENOUS

## 2016-10-28 MED ORDER — FENTANYL CITRATE (PF) 250 MCG/5ML IJ SOLN
INTRAMUSCULAR | Status: AC
  Filled 2016-10-28: qty 5

## 2016-10-28 MED ORDER — MIDAZOLAM HCL 5 MG/5ML IJ SOLN
INTRAMUSCULAR | Status: DC | PRN
  Administered 2016-10-28: 2 mg via INTRAVENOUS

## 2016-10-28 MED ORDER — SUGAMMADEX SODIUM 200 MG/2ML IV SOLN
INTRAVENOUS | Status: AC
  Filled 2016-10-28: qty 2

## 2016-10-28 MED ORDER — ROCURONIUM BROMIDE 10 MG/ML (PF) SYRINGE
PREFILLED_SYRINGE | INTRAVENOUS | Status: DC | PRN
  Administered 2016-10-28: 40 mg via INTRAVENOUS

## 2016-10-28 MED ORDER — ALBUMIN HUMAN 5 % IV SOLN
INTRAVENOUS | Status: DC | PRN
  Administered 2016-10-28 (×2): via INTRAVENOUS

## 2016-10-28 MED ORDER — PROPOFOL 10 MG/ML IV BOLUS
INTRAVENOUS | Status: AC
  Filled 2016-10-28: qty 20

## 2016-10-28 MED ORDER — DEXAMETHASONE SODIUM PHOSPHATE 10 MG/ML IJ SOLN
INTRAMUSCULAR | Status: DC | PRN
  Administered 2016-10-28: 10 mg via INTRAVENOUS

## 2016-10-28 MED ORDER — MIDAZOLAM HCL 2 MG/2ML IJ SOLN
INTRAMUSCULAR | Status: AC
  Filled 2016-10-28: qty 2

## 2016-10-28 MED ORDER — HYDROMORPHONE HCL 1 MG/ML IJ SOLN
INTRAMUSCULAR | Status: AC
  Filled 2016-10-28: qty 1

## 2016-10-28 MED ORDER — PROMETHAZINE HCL 25 MG/ML IJ SOLN: 6.2500 mg | INTRAMUSCULAR | Status: DC | PRN

## 2016-10-28 MED ORDER — FENTANYL CITRATE (PF) 100 MCG/2ML IJ SOLN
INTRAMUSCULAR | Status: DC | PRN
  Administered 2016-10-28 (×2): 50 ug via INTRAVENOUS

## 2016-10-28 MED ORDER — DIPHENHYDRAMINE HCL 12.5 MG/5ML PO ELIX: 12.5000 mg | ORAL_SOLUTION | Freq: Four times a day (QID) | ORAL | Status: DC | PRN

## 2016-10-28 MED ORDER — ONDANSETRON HCL 4 MG/2ML IJ SOLN
INTRAMUSCULAR | Status: AC
  Filled 2016-10-28: qty 2

## 2016-10-28 MED ORDER — ALBUMIN HUMAN 5 % IV SOLN
INTRAVENOUS | Status: AC
  Filled 2016-10-28: qty 500

## 2016-10-28 MED ORDER — CEFAZOLIN SODIUM-DEXTROSE 2-4 GM/100ML-% IV SOLN
2.0000 g | Freq: Once | INTRAVENOUS | Status: AC
  Administered 2016-10-28: 2 g via INTRAVENOUS
  Filled 2016-10-28: qty 100

## 2016-10-28 MED ORDER — PANTOPRAZOLE SODIUM 40 MG IV SOLR
40.0000 mg | Freq: Every day | INTRAVENOUS | Status: DC
  Administered 2016-10-28 – 2016-11-01 (×5): 40 mg via INTRAVENOUS
  Filled 2016-10-28 (×5): qty 40

## 2016-10-28 MED ORDER — KCL IN DEXTROSE-NACL 20-5-0.45 MEQ/L-%-% IV SOLN
INTRAVENOUS | Status: DC
  Administered 2016-10-28: 1000 mL via INTRAVENOUS
  Administered 2016-10-29 – 2016-11-01 (×6): via INTRAVENOUS
  Administered 2016-11-01: 1000 mL via INTRAVENOUS
  Filled 2016-10-28 (×14): qty 1000

## 2016-10-28 MED ORDER — DEXAMETHASONE SODIUM PHOSPHATE 10 MG/ML IJ SOLN
INTRAMUSCULAR | Status: AC
Start: 2016-10-28 — End: 2016-10-28
  Filled 2016-10-28: qty 1

## 2016-10-28 MED ORDER — ONDANSETRON 4 MG PO TBDP: 4.0000 mg | ORAL_TABLET | Freq: Four times a day (QID) | ORAL | Status: DC | PRN

## 2016-10-28 MED ORDER — MORPHINE SULFATE 2 MG/ML IV SOLN
INTRAVENOUS | Status: DC
  Administered 2016-10-28: 13:00:00 via INTRAVENOUS
  Administered 2016-10-28: 9 mg via INTRAVENOUS
  Administered 2016-10-28: 3 mg via INTRAVENOUS
  Administered 2016-10-28: 4.5 mg via INTRAVENOUS
  Administered 2016-10-29: 7.5 mg via INTRAVENOUS
  Administered 2016-10-29: 3 mg via INTRAVENOUS
  Administered 2016-10-29 (×2): 1.5 mg via INTRAVENOUS
  Administered 2016-10-29: 3 mg via INTRAVENOUS
  Administered 2016-10-29: 0 mg via INTRAVENOUS
  Administered 2016-10-30: 3 mg via INTRAVENOUS
  Administered 2016-10-30: 1.5 mg via INTRAVENOUS
  Filled 2016-10-28: qty 30

## 2016-10-28 MED ORDER — MENTHOL 3 MG MT LOZG: 1.0000 | LOZENGE | OROMUCOSAL | Status: DC | PRN

## 2016-10-28 MED ORDER — LACTATED RINGERS IV SOLN
INTRAVENOUS | Status: DC | PRN
  Administered 2016-10-28 (×2): via INTRAVENOUS

## 2016-10-28 MED ORDER — SODIUM CHLORIDE 0.9% FLUSH: 9.0000 mL | INTRAVENOUS | Status: DC | PRN

## 2016-10-28 MED ORDER — 0.9 % SODIUM CHLORIDE (POUR BTL) OPTIME
TOPICAL | Status: DC | PRN
  Administered 2016-10-28: 4000 mL

## 2016-10-28 MED ORDER — NALOXONE HCL 0.4 MG/ML IJ SOLN: 0.4000 mg | INTRAMUSCULAR | Status: DC | PRN

## 2016-10-28 MED ORDER — HYDROMORPHONE HCL 1 MG/ML IJ SOLN
INTRAMUSCULAR | Status: AC
  Administered 2016-10-28: 0.5 mg via INTRAVENOUS
  Filled 2016-10-28: qty 1

## 2016-10-28 MED ORDER — ONDANSETRON HCL 4 MG/2ML IJ SOLN: 4.0000 mg | Freq: Four times a day (QID) | INTRAMUSCULAR | Status: DC | PRN

## 2016-10-28 MED ORDER — HYDROMORPHONE HCL 1 MG/ML IJ SOLN: 0.2500 mg | INTRAMUSCULAR | Status: DC | PRN

## 2016-10-28 MED ORDER — ENOXAPARIN SODIUM 40 MG/0.4ML ~~LOC~~ SOLN
40.0000 mg | SUBCUTANEOUS | Status: DC
  Administered 2016-10-29 – 2016-11-04 (×7): 40 mg via SUBCUTANEOUS
  Filled 2016-10-28 (×7): qty 0.4

## 2016-10-28 MED ORDER — EPHEDRINE 5 MG/ML INJ
INTRAVENOUS | Status: AC
  Filled 2016-10-28: qty 10

## 2016-10-28 MED ORDER — EPHEDRINE SULFATE 50 MG/ML IJ SOLN
INTRAMUSCULAR | Status: DC | PRN
  Administered 2016-10-28: 10 mg via INTRAVENOUS

## 2016-10-28 MED ORDER — PROPOFOL 10 MG/ML IV BOLUS
INTRAVENOUS | Status: DC | PRN
  Administered 2016-10-28: 200 mg via INTRAVENOUS

## 2016-10-28 MED ORDER — DIPHENHYDRAMINE HCL 50 MG/ML IJ SOLN: 12.5000 mg | Freq: Four times a day (QID) | INTRAMUSCULAR | Status: DC | PRN

## 2016-10-28 MED ORDER — SUCCINYLCHOLINE CHLORIDE 200 MG/10ML IV SOSY
PREFILLED_SYRINGE | INTRAVENOUS | Status: DC | PRN
  Administered 2016-10-28: 120 mg via INTRAVENOUS

## 2016-10-28 MED ORDER — ONDANSETRON HCL 4 MG/2ML IJ SOLN
INTRAMUSCULAR | Status: DC | PRN
  Administered 2016-10-28: 4 mg via INTRAVENOUS

## 2016-10-28 SURGICAL SUPPLY — 43 items
BLADE EXTENDED COATED 6.5IN (ELECTRODE) ×1 IMPLANT
CHLORAPREP W/TINT 26ML (MISCELLANEOUS) ×3 IMPLANT
COVER MAYO STAND STRL (DRAPES) ×3 IMPLANT
COVER SURGICAL LIGHT HANDLE (MISCELLANEOUS) ×1 IMPLANT
DRAIN CHANNEL 19F RND (DRAIN) IMPLANT
DRAPE LAPAROSCOPIC ABDOMINAL (DRAPES) ×3 IMPLANT
DRAPE LG THREE QUARTER DISP (DRAPES) IMPLANT
DRAPE UTILITY XL STRL (DRAPES) ×3 IMPLANT
DRAPE WARM FLUID 44X44 (DRAPE) ×3 IMPLANT
DRSG OPSITE POSTOP 4X10 (GAUZE/BANDAGES/DRESSINGS) ×2 IMPLANT
ELECT REM PT RETURN 15FT ADLT (MISCELLANEOUS) ×3 IMPLANT
EVACUATOR SILICONE 100CC (DRAIN) IMPLANT
GAUZE SPONGE 4X4 12PLY STRL (GAUZE/BANDAGES/DRESSINGS) ×1 IMPLANT
GLOVE ECLIPSE 8.0 STRL XLNG CF (GLOVE) ×6 IMPLANT
GLOVE INDICATOR 8.0 STRL GRN (GLOVE) ×3 IMPLANT
GOWN STRL REUS W/TWL XL LVL3 (GOWN DISPOSABLE) ×8 IMPLANT
HANDLE SUCTION POOLE (INSTRUMENTS) ×1 IMPLANT
HOLDER FOLEY CATH W/STRAP (MISCELLANEOUS) ×2 IMPLANT
KIT BASIN OR (CUSTOM PROCEDURE TRAY) ×3 IMPLANT
LEGGING LITHOTOMY PAIR STRL (DRAPES) IMPLANT
PACK GENERAL/GYN (CUSTOM PROCEDURE TRAY) ×3 IMPLANT
PAD POSITIONING PINK XL (MISCELLANEOUS) ×1 IMPLANT
SEALER TISSUE X1 CVD JAW (INSTRUMENTS) IMPLANT
SPONGE LAP 18X18 X RAY DECT (DISPOSABLE) IMPLANT
STAPLER VISISTAT 35W (STAPLE) ×3 IMPLANT
SUCTION POOLE HANDLE (INSTRUMENTS) ×3
SUT ETHILON 3 0 PS 1 (SUTURE) IMPLANT
SUT NOVA 1 T20/GS 25DT (SUTURE) IMPLANT
SUT PDS AB 1 CTX 36 (SUTURE) ×4 IMPLANT
SUT PDS AB 1 TP1 96 (SUTURE) IMPLANT
SUT SILK 2 0 (SUTURE) ×3
SUT SILK 2 0 SH CR/8 (SUTURE) ×3 IMPLANT
SUT SILK 2-0 18XBRD TIE 12 (SUTURE) ×1 IMPLANT
SUT SILK 3 0 (SUTURE) ×3
SUT SILK 3 0 SH CR/8 (SUTURE) ×3 IMPLANT
SUT SILK 3-0 18XBRD TIE 12 (SUTURE) ×1 IMPLANT
SUT VIC AB 2-0 SH 18 (SUTURE) IMPLANT
SUT VIC AB 3-0 SH 18 (SUTURE) IMPLANT
TOWEL OR 17X26 10 PK STRL BLUE (TOWEL DISPOSABLE) ×4 IMPLANT
TOWEL OR NON WOVEN STRL DISP B (DISPOSABLE) ×3 IMPLANT
TRAY FOLEY BAG SILVER LF 14FR (CATHETERS) IMPLANT
TRAY FOLEY CATH 14FRSI W/METER (CATHETERS) ×2 IMPLANT
TRAY FOLEY W/METER SILVER 16FR (SET/KITS/TRAYS/PACK) IMPLANT

## 2016-10-28 NOTE — Op Note (Signed)
Operative Note  Debbie Brandt female 56 y.o. 10/28/2016  PREOPERATIVE DX:  SBO  POSTOPERATIVE DX:  Same  PROCEDURE:   Exploratory laparotomy, lysis of adhesions         Surgeon: Adolph PollackOSENBOWER,Kynnedy Carreno J   Assistants: Jaclynn GuarneriBen Hoxworth, M.D.  Anesthesia: General endotracheal anesthesia  Indications:   This is a 56 year old female who was admitted last week with a small bowel obstruction. He was felt to be secondary to adhesions or possible cystic mass in the right pelvis. She has failed to improve and is now brought to the operating room for exploratory laparotomy.    Procedure Detail:  She was brought to the operating room placed supine on the operating table and general anesthetic was given. A nasogastric tube was already in. A Foley catheter was inserted. The abdominal wall was widely sterilely prepped and draped. A timeout was performed.  A midline incision was made beginning just above the umbilicus and extending down to the pelvis dividing the skin, subcutaneous tissue, fascia, and peritoneum. Upon entering the peritoneal cavity, clear ascites was noted and evacuated. Dilated small bowel was noted. I began running the small bowel and then noted a transition zone in the pelvis from a band adhesion. I cut this adhesion and relieved the obstruction. I then noted an adhesion between the right fallopian tube and ovary and another portion of distal ileum. I divided this adhesion sharply. I believe this was the cystic type mass seen on CT scan as no other masses were noted.  Following this the small bowel was run in its entirety. No masses was noted.  No other points of obstruction were noted. I milked fluid through the previous point of obstruction without difficulty.  I then milked fluid back to the small bowel into the stomach and evacuated 2 L of enteric contents into the nasogastric tube. This decompressed the small bowel.  The abdominal cavity was then copiously irrigated with warm saline  solution which was evacuated. The fascia was then closed with running #1 PDS suture. Needle sponge and instrument counts were reported to be correct. The subcutaneous tissue was irrigated and the skin was closed with staples.  She tolerated the procedure well, without any apparent complications, and was taken to the recovery room in satisfactory condition.  Estimated blood loss was less than 150 cc.    Findings: Band adhesion in the right pelvis leading to complete small bowel obstruction         Specimens: none        Complications:  * No complications entered in OR log *         Disposition: PACU - hemodynamically stable.         Condition: stable

## 2016-10-28 NOTE — Transfer of Care (Signed)
Immediate Anesthesia Transfer of Care Note  Patient: Sherron FlemingsCynthia L Stauder  Procedure(s) Performed: Procedure(s): EXPLORATORY LAPAROTOMY WITH  LYSIS OF ADHESIONS (N/A)  Patient Location: PACU  Anesthesia Type:General  Level of Consciousness: sedated  Airway & Oxygen Therapy: Patient Spontanous Breathing and Patient connected to face mask oxygen  Post-op Assessment: Report given to RN and Post -op Vital signs reviewed and stable  Post vital signs: Reviewed and stable  Last Vitals:  Vitals:   10/27/16 2200 10/28/16 0522  BP: 136/65 (!) 128/45  Pulse:  77  Resp:  16  Temp:  37.2 C    Last Pain:  Vitals:   10/28/16 0730  TempSrc:   PainSc: 3       Patients Stated Pain Goal: 4 (10/27/16 2122)  Complications: No apparent anesthesia complications

## 2016-10-28 NOTE — Anesthesia Procedure Notes (Addendum)
Procedure Name: Intubation Date/Time: 10/28/2016 10:54 AM Performed by: Lind Covert Pre-anesthesia Checklist: Patient identified, Emergency Drugs available, Suction available, Patient being monitored and Timeout performed Patient Re-evaluated:Patient Re-evaluated prior to inductionOxygen Delivery Method: Circle system utilized Preoxygenation: Pre-oxygenation with 100% oxygen Intubation Type: IV induction, Rapid sequence and Cricoid Pressure applied Laryngoscope Size: Mac, 3 and Glidescope Grade View: Grade III Tube type: Oral Tube size: 7.0 mm Number of attempts: 2 Airway Equipment and Method: Stylet,  Video-laryngoscopy and Bougie stylet Placement Confirmation: ETT inserted through vocal cords under direct vision,  positive ETCO2 and breath sounds checked- equal and bilateral Secured at: 22 cm Tube secured with: Tape Dental Injury: Teeth and Oropharynx as per pre-operative assessment  Difficulty Due To: Difficulty was unanticipated, Difficult Airway- due to immobile epiglottis and Difficult Airway- due to anterior larynx Future Recommendations: Recommend- induction with short-acting agent, and alternative techniques readily available

## 2016-10-28 NOTE — Anesthesia Preprocedure Evaluation (Signed)
Anesthesia Evaluation  Patient identified by MRN, date of birth, ID band Patient awake    Reviewed: Allergy & Precautions, NPO status , Patient's Chart, lab work & pertinent test results  Airway Mallampati: II  TM Distance: >3 FB Neck ROM: Full    Dental no notable dental hx.    Pulmonary neg pulmonary ROS,    Pulmonary exam normal breath sounds clear to auscultation       Cardiovascular hypertension, Normal cardiovascular exam Rhythm:Regular Rate:Normal     Neuro/Psych negative neurological ROS  negative psych ROS   GI/Hepatic Neg liver ROS, GERD  Medicated,  Endo/Other  negative endocrine ROS  Renal/GU negative Renal ROS  negative genitourinary   Musculoskeletal negative musculoskeletal ROS (+)   Abdominal   Peds negative pediatric ROS (+)  Hematology negative hematology ROS (+)   Anesthesia Other Findings   Reproductive/Obstetrics negative OB ROS                             Anesthesia Physical Anesthesia Plan  ASA: II  Anesthesia Plan: General   Post-op Pain Management:    Induction: Intravenous, Rapid sequence and Cricoid pressure planned  Airway Management Planned: Oral ETT  Additional Equipment:   Intra-op Plan:   Post-operative Plan: Extubation in OR  Informed Consent: I have reviewed the patients History and Physical, chart, labs and discussed the procedure including the risks, benefits and alternatives for the proposed anesthesia with the patient or authorized representative who has indicated his/her understanding and acceptance.   Dental advisory given  Plan Discussed with: CRNA and Surgeon  Anesthesia Plan Comments:         Anesthesia Quick Evaluation

## 2016-10-28 NOTE — Addendum Note (Signed)
Addendum  created 10/28/16 1404 by Eilene GhaziGeorge Oluwaseun Cremer, MD   Order list changed, Order sets accessed

## 2016-10-28 NOTE — Anesthesia Postprocedure Evaluation (Signed)
Anesthesia Post Note  Patient: Debbie FlemingsCynthia L Brandt  Procedure(s) Performed: Procedure(s) (LRB): EXPLORATORY LAPAROTOMY WITH  LYSIS OF ADHESIONS (N/A)  Patient location during evaluation: PACU Anesthesia Type: General Level of consciousness: awake and alert Pain management: pain level controlled Vital Signs Assessment: post-procedure vital signs reviewed and stable Respiratory status: spontaneous breathing, nonlabored ventilation, respiratory function stable and patient connected to nasal cannula oxygen Cardiovascular status: blood pressure returned to baseline and stable Postop Assessment: no signs of nausea or vomiting Anesthetic complications: no       Last Vitals:  Vitals:   10/28/16 1230 10/28/16 1245  BP: (!) 143/64 (!) 158/71  Pulse: 95 94  Resp: (!) 21 (!) 24  Temp:      Last Pain:  Vitals:   10/28/16 1300  TempSrc:   PainSc: 10-Worst pain ever                 Safiatou Islam S

## 2016-10-28 NOTE — Progress Notes (Signed)
Assessment Principal Problem:   Small bowel obstruction-persists clinically and by x-ray; may be due to small mesenteric mass vs adhesions Active Problems:   HTN (hypertension)   Hypothyroidism   Abdominal mass   Hypokalemia   Plan:  To OR for exploratory laparotomy, possible bowel resection. The procedure, rationale, and risks have been discussed. The risks include but are not limited to bleeding, infection, wound problems, anesthesia, need for intestinal resection, anastomotic leak, need for reoperation, injury to intra-abdominal organs, prolonged ileus.   LOS: 5 days        Subjective: No flatus or BM.  Having some abdominal pain.  Objective: Vital signs in last 24 hours: Temp:  [98.9 F (37.2 C)-99.4 F (37.4 C)] 98.9 F (37.2 C) (02/12 0522) Pulse Rate:  [73-77] 77 (02/12 0522) Resp:  [16] 16 (02/12 0522) BP: (89-164)/(45-78) 128/45 (02/12 0522) SpO2:  [96 %-100 %] 96 % (02/12 0522) Last BM Date: 10/22/16  Intake/Output from previous day: 02/11 0701 - 02/12 0700 In: 2436.7 [I.V.:2436.7] Out: 1700 [Urine:400; Emesis/NG output:1300] Intake/Output this shift: No intake/output data recorded.  PE: General- In NAD Abdomen-slightly firm and distended, mild diffuse tenderness, no hernia, hypoactive bowel sounds  Lab Results:   Recent Labs  10/26/16 0514  WBC 6.9  HGB 13.0  HCT 40.5  PLT 412*   BMET  Recent Labs  10/27/16 0417 10/28/16 0435  NA 139 138  K 3.6 3.6  CL 107 105  CO2 28 28  GLUCOSE 129* 120*  BUN 9 8  CREATININE 0.82 0.78  CALCIUM 8.2* 8.3*   PT/INR No results for input(s): LABPROT, INR in the last 72 hours. Comprehensive Metabolic Panel:    Component Value Date/Time   NA 138 10/28/2016 0435   NA 139 10/27/2016 0417   K 3.6 10/28/2016 0435   K 3.6 10/27/2016 0417   CL 105 10/28/2016 0435   CL 107 10/27/2016 0417   CO2 28 10/28/2016 0435   CO2 28 10/27/2016 0417   BUN 8 10/28/2016 0435   BUN 9 10/27/2016 0417   CREATININE 0.78  10/28/2016 0435   CREATININE 0.82 10/27/2016 0417   CREATININE 0.83 09/26/2015 0955   CREATININE 0.85 11/26/2014 1300   GLUCOSE 120 (H) 10/28/2016 0435   GLUCOSE 129 (H) 10/27/2016 0417   CALCIUM 8.3 (L) 10/28/2016 0435   CALCIUM 8.2 (L) 10/27/2016 0417   AST 20 10/23/2016 1450   AST 22 10/22/2016 0741   ALT 20 10/23/2016 1450   ALT 20 10/22/2016 0741   ALKPHOS 58 10/23/2016 1450   ALKPHOS 69 10/22/2016 0741   BILITOT 0.6 10/23/2016 1450   BILITOT 0.4 10/22/2016 0741   PROT 7.3 10/23/2016 1450   PROT 7.1 10/22/2016 0741   ALBUMIN 4.1 10/23/2016 1450   ALBUMIN 4.1 10/22/2016 0741     Studies/Results: Dg Abd 2 Views  Result Date: 10/28/2016 CLINICAL DATA:  Small bowel obstruction, hysterectomy EXAM: ABDOMEN - 2 VIEW COMPARISON:  10/27/2016 FINDINGS: Persistent distended small bowel loops in left upper abdomen with air-fluid levels consistent with small bowel obstruction. NG tube coiled within distal stomach with tip in proximal stomach. Some colonic stool noted in right colon. IMPRESSION: Persistent small bowel obstruction pattern. NG tube coiled within distal stomach with tip in proximal stomach. Electronically Signed   By: Natasha Mead M.D.   On: 10/28/2016 08:25   Dg Abd 2 Views  Result Date: 10/27/2016 CLINICAL DATA:  Small bowel obstruction EXAM: ABDOMEN - 2 VIEW COMPARISON:  10/26/2016 FINDINGS: NG tube remains looped  within the stomach with the tip in the proximal stomach. Continued gaseous distention of small bowel loops compatible with small bowel obstruction, not significantly changed. IMPRESSION: Small bowel obstruction pattern, not significantly changed. Electronically Signed   By: Charlett NoseKevin  Dover M.D.   On: 10/27/2016 08:46    Anti-infectives: Anti-infectives    None       [place] Masaki J 10/28/2016

## 2016-10-29 ENCOUNTER — Encounter (HOSPITAL_COMMUNITY): Payer: Self-pay | Admitting: General Surgery

## 2016-10-29 LAB — CBC
HEMATOCRIT: 34.2 % — AB (ref 36.0–46.0)
HEMOGLOBIN: 11.2 g/dL — AB (ref 12.0–15.0)
MCH: 27.1 pg (ref 26.0–34.0)
MCHC: 32.7 g/dL (ref 30.0–36.0)
MCV: 82.8 fL (ref 78.0–100.0)
Platelets: 340 10*3/uL (ref 150–400)
RBC: 4.13 MIL/uL (ref 3.87–5.11)
RDW: 13.8 % (ref 11.5–15.5)
WBC: 7.8 10*3/uL (ref 4.0–10.5)

## 2016-10-29 LAB — BASIC METABOLIC PANEL
Anion gap: 7 (ref 5–15)
BUN: 6 mg/dL (ref 6–20)
CHLORIDE: 104 mmol/L (ref 101–111)
CO2: 28 mmol/L (ref 22–32)
Calcium: 8.3 mg/dL — ABNORMAL LOW (ref 8.9–10.3)
Creatinine, Ser: 0.68 mg/dL (ref 0.44–1.00)
GFR calc non Af Amer: >60 mL/min (ref >60)
Glucose, Bld: 115 mg/dL — ABNORMAL HIGH (ref 65–99)
POTASSIUM: 3.7 mmol/L (ref 3.5–5.1)
SODIUM: 139 mmol/L (ref 135–145)

## 2016-10-29 MED ORDER — LEVOTHYROXINE SODIUM 100 MCG IV SOLR
37.5000 ug | Freq: Every day | INTRAVENOUS | Status: DC
  Administered 2016-10-29 – 2016-11-02 (×5): 37.5 ug via INTRAVENOUS
  Filled 2016-10-29 (×5): qty 5

## 2016-10-29 MED ORDER — ACETAMINOPHEN 10 MG/ML IV SOLN
1000.0000 mg | Freq: Four times a day (QID) | INTRAVENOUS | Status: AC
  Administered 2016-10-29 – 2016-10-30 (×4): 1000 mg via INTRAVENOUS
  Filled 2016-10-29 (×6): qty 100

## 2016-10-29 NOTE — Progress Notes (Signed)
1 Day Post-Op  Subjective: NG wasn't working I fix that. She looks fairly uncomfortable but is not complaining very much. Incision okay, no bowel sounds.  Objective: Vital signs in last 24 hours: Temp:  [97.5 F (36.4 C)-98.9 F (37.2 C)] 98.6 F (37 C) (02/13 0552) Pulse Rate:  [72-95] 72 (02/13 0150) Resp:  [13-24] 17 (02/13 0754) BP: (119-165)/(52-74) 123/52 (02/13 0552) SpO2:  [92 %-100 %] 100 % (02/13 0754) Last BM Date: 10/22/16 PO 90 IV 4000 Urine 2875 NG 150 Afebrile, VSS Labs OK Intake/Output from previous day: 02/12 0701 - 02/13 0700 In: 4093.3 [P.O.:90; I.V.:3383.3; NG/GT:120; IV Piggyback:500] Out: 3050 [Urine:2875; Emesis/NG output:150; Blood:25] Intake/Output this shift: No intake/output data recorded.  General appearance: alert, cooperative and no distress Resp: clear to auscultation bilaterally GI: Soft no bowel sounds, still very tender. No flatus.  Lab Results:   Recent Labs  10/29/16 0444  WBC 7.8  HGB 11.2*  HCT 34.2*  PLT 340    BMET  Recent Labs  10/28/16 0435 10/29/16 0444  NA 138 139  K 3.6 3.7  CL 105 104  CO2 28 28  GLUCOSE 120* 115*  BUN 8 6  CREATININE 0.78 0.68  CALCIUM 8.3* 8.3*   PT/INR No results for input(s): LABPROT, INR in the last 72 hours.   Recent Labs Lab 10/23/16 1450  AST 20  ALT 20  ALKPHOS 58  BILITOT 0.6  PROT 7.3  ALBUMIN 4.1     Lipase     Component Value Date/Time   LIPASE 18 10/23/2016 1450     Studies/Results: Dg Abd 2 Views  Result Date: 10/28/2016 CLINICAL DATA:  Small bowel obstruction, hysterectomy EXAM: ABDOMEN - 2 VIEW COMPARISON:  10/27/2016 FINDINGS: Persistent distended small bowel loops in left upper abdomen with air-fluid levels consistent with small bowel obstruction. NG tube coiled within distal stomach with tip in proximal stomach. Some colonic stool noted in right colon. IMPRESSION: Persistent small bowel obstruction pattern. NG tube coiled within distal stomach with tip  in proximal stomach. Electronically Signed   By: Natasha Mead M.D.   On: 10/28/2016 08:25   Prior to Admission medications   Medication Sig Start Date End Date Taking? Authorizing Provider  Calcium Carbonate-Vitamin D 600-400 MG-UNIT per tablet Take 1 tablet by mouth daily.   Yes Historical Provider, MD  levothyroxine (SYNTHROID, LEVOTHROID) 75 MCG tablet Take 75 mcg by mouth daily before breakfast.   Yes Historical Provider, MD  MULTIPLE VITAMIN PO Take 1 tablet by mouth daily.   Yes Historical Provider, MD  omeprazole (PRILOSEC) 20 MG capsule Take 1 capsule (20 mg total) by mouth daily. 03/09/16  Yes April Palumbo, MD  promethazine (PHENERGAN) 25 MG tablet Take 25 mg by mouth every 8 (eight) hours as needed for nausea/vomiting. 10/22/16  Yes Historical Provider, MD  TRAVATAN Z 0.004 % SOLN ophthalmic solution Place 1 drop into the right eye daily at 6 PM.  12/10/15  Yes Historical Provider, MD  triamterene-hydrochlorothiazide (MAXZIDE-25) 37.5-25 MG per tablet Take 1 tablet by mouth daily.   Yes Historical Provider, MD  methocarbamol (ROBAXIN) 500 MG tablet Take 1 tablet (500 mg total) by mouth 2 (two) times daily. Patient not taking: Reported on 10/22/2016 03/09/16   April Palumbo, MD    Medications: . enoxaparin (LOVENOX) injection  40 mg Subcutaneous Q24H  . morphine   Intravenous Q4H  . pantoprazole (PROTONIX) IV  40 mg Intravenous QHS   . dextrose 5 % and 0.45 % NaCl with KCl  20 mEq/L 125 mL/hr at 10/29/16 [id number]   Assessment/Plan SBO due to adhesions s/p expl lap and lysis of adhesions (Blakely Maranan) 10/28/16-stable overnight. Hypothyroid Hypertension GERD FEN: IV fluids/NPO ID: Rocephin 2 g IV preop DVT:  Lovenox  Plan: Continue NG, out of bed to chair, begin ambulation, DC Foley tomorrow. I'll replace her thyroid IV. Hold off on blood pressure medications.      LOS: 6 days    JENNINGS,WILLARD 10/29/2016 [id number] [phone]

## 2016-10-29 NOTE — Progress Notes (Addendum)
Patient woke up at 9 pm  and stated she doesn't know where she at thought she is at home and was confused about the IV pump,PCA. Patient was reoriented was able to answer all her questions.  We will continue to monitor and assess the patient.

## 2016-10-30 LAB — CBC
HCT: 33.3 % — ABNORMAL LOW (ref 36.0–46.0)
Hemoglobin: 10.8 g/dL — ABNORMAL LOW (ref 12.0–15.0)
MCH: 27.1 pg (ref 26.0–34.0)
MCHC: 32.4 g/dL (ref 30.0–36.0)
MCV: 83.7 fL (ref 78.0–100.0)
PLATELETS: 343 10*3/uL (ref 150–400)
RBC: 3.98 MIL/uL (ref 3.87–5.11)
RDW: 13.9 % (ref 11.5–15.5)
WBC: 7.3 10*3/uL (ref 4.0–10.5)

## 2016-10-30 LAB — BASIC METABOLIC PANEL
ANION GAP: 4 — AB (ref 5–15)
BUN: 6 mg/dL (ref 6–20)
CO2: 31 mmol/L (ref 22–32)
Calcium: 8.2 mg/dL — ABNORMAL LOW (ref 8.9–10.3)
Chloride: 105 mmol/L (ref 101–111)
Creatinine, Ser: 0.78 mg/dL (ref 0.44–1.00)
Glucose, Bld: 115 mg/dL — ABNORMAL HIGH (ref 65–99)
POTASSIUM: 3.6 mmol/L (ref 3.5–5.1)
SODIUM: 140 mmol/L (ref 135–145)

## 2016-10-30 MED ORDER — ACETAMINOPHEN 500 MG PO TABS
1000.0000 mg | ORAL_TABLET | Freq: Four times a day (QID) | ORAL | Status: DC
  Administered 2016-10-30 – 2016-11-04 (×12): 1000 mg via ORAL
  Filled 2016-10-30 (×12): qty 2

## 2016-10-30 MED ORDER — MORPHINE SULFATE (PF) 4 MG/ML IV SOLN
1.0000 mg | INTRAVENOUS | Status: DC | PRN
  Administered 2016-10-30 – 2016-11-01 (×3): 4 mg via INTRAVENOUS
  Administered 2016-11-01: 2 mg via INTRAVENOUS
  Administered 2016-11-01: 4 mg via INTRAVENOUS
  Filled 2016-10-30 (×5): qty 1

## 2016-10-30 NOTE — Progress Notes (Signed)
2 Days Post-Op  Subjective: I think she is doing a little bit better. NG is in place and working but almost no drainage. She is having some flatus. Doesn't look like she's been out of bed very much.  Objective: Vital signs in last 24 hours: Temp:  [98.3 F (36.8 C)-99.6 F (37.6 C)] 98.3 F (36.8 C) (02/14 0546) Pulse Rate:  [63-75] 65 (02/14 0546) Resp:  [15-21] 20 (02/14 0855) BP: (113-141)/(56-79) 113/57 (02/14 0546) SpO2:  [98 %-100 %] 99 % (02/14 0855) Last BM Date: 10/23/16 120 PO 2800 IV Urine 1375 NG 250 recorded Afebrile, VSS No films Labs OK Intake/Output from previous day: 02/13 0701 - 02/14 0700 In: 2930 [P.O.:120; I.V.:2380; NG/GT:30; IV Piggyback:400] Out: 1625 [Urine:1375; Emesis/NG output:250] Intake/Output this shift: Total I/O In: 0  Out: 450 [Urine:450]  General appearance: alert, cooperative and no distress Resp: clear to auscultation bilaterally GI: Soft, midline incision looks good I took the waffle dressing down. Few bowel sounds, some flatus.  Lab Results:   Recent Labs  10/29/16 0444 10/30/16 0518  WBC 7.8 7.3  HGB 11.2* 10.8*  HCT 34.2* 33.3*  PLT 340 343    BMET  Recent Labs  10/29/16 0444 10/30/16 0518  NA 139 140  K 3.7 3.6  CL 104 105  CO2 28 31  GLUCOSE 115* 115*  BUN 6 6  CREATININE 0.68 0.78  CALCIUM 8.3* 8.2*   PT/INR No results for input(s): LABPROT, INR in the last 72 hours.   Recent Labs Lab 10/23/16 1450  AST 20  ALT 20  ALKPHOS 58  BILITOT 0.6  PROT 7.3  ALBUMIN 4.1     Lipase     Component Value Date/Time   LIPASE 18 10/23/2016 1450     Studies/Results: No results found. Prior to Admission medications   Medication Sig Start Date End Date Taking? Authorizing Provider  Calcium Carbonate-Vitamin D 600-400 MG-UNIT per tablet Take 1 tablet by mouth daily.   Yes Historical Provider, MD  levothyroxine (SYNTHROID, LEVOTHROID) 75 MCG tablet Take 75 mcg by mouth daily before breakfast.   Yes  Historical Provider, MD  MULTIPLE VITAMIN PO Take 1 tablet by mouth daily.   Yes Historical Provider, MD  omeprazole (PRILOSEC) 20 MG capsule Take 1 capsule (20 mg total) by mouth daily. 03/09/16  Yes April Palumbo, MD  promethazine (PHENERGAN) 25 MG tablet Take 25 mg by mouth every 8 (eight) hours as needed for nausea/vomiting. 10/22/16  Yes Historical Provider, MD  TRAVATAN Z 0.004 % SOLN ophthalmic solution Place 1 drop into the right eye daily at 6 PM.  12/10/15  Yes Historical Provider, MD  triamterene-hydrochlorothiazide (MAXZIDE-25) 37.5-25 MG per tablet Take 1 tablet by mouth daily.   Yes Historical Provider, MD  methocarbamol (ROBAXIN) 500 MG tablet Take 1 tablet (500 mg total) by mouth 2 (two) times daily. Patient not taking: Reported on 10/22/2016 03/09/16   April Palumbo, MD    Medications: . enoxaparin (LOVENOX) injection  40 mg Subcutaneous Q24H  . levothyroxine  37.5 mcg Intravenous Daily  . morphine   Intravenous Q4H  . pantoprazole (PROTONIX) IV  40 mg Intravenous QHS   . dextrose 5 % and 0.45 % NaCl with KCl 20 mEq/L 100 mL/hr at 10/30/16 0600    Assessment/Plan SBO due to adhesions s/p expl lap and lysis of adhesions (Rosenbower) 10/28/16-stable overnight. Hypothyroid Hypertension GERD FEN: IV fluids/NPO ID: Rocephin 2 g IV preop DVT:  Lovenox    Plan: DC PCA, clamp the NG,  start her on sips of clears and see how she does. If okay pull the NG later today. Mobilize.   LOS: 7 days    Arletha Marschke 10/30/2016 [phone]

## 2016-10-30 NOTE — Progress Notes (Signed)
NG tube clamped for over 2 hours while pt took in clear liquids. Tolerated well with no nausea and no output when hooked back to NG suction. NG tube pulled with no issues noted. Will continue to monitor.

## 2016-10-30 NOTE — Discharge Summary (Signed)
Physician Discharge Summary  Patient ID: Debbie FlemingsCynthia L Brandt MRN: [id number] DOB/AGE: [birth year] 56 y.o.  Admit date: 10/23/2016 Discharge date: 11/04/2016  Admission Diagnoses:  SBO Hx of hypertension Hx of GERD Hypothyroid  Discharge Diagnoses:  Principal Problem:   Small bowel obstruction s/p ex lap & lysis of adhesions 10/28/2016 Active Problems:   HTN (hypertension)   Hypothyroidism   Abdominal mass   Hypokalemia   Small bowel obstruction due to adhesions   PROCEDURES: Status post exploratory laparotomy with lysis of adhesions 10/28/16, octreotide Rosenbower.  Hospital Course:  The patient is a 56 year old black female who presented to the emergency department with abdominal pain and nausea and vomiting yesterday. She underwent a CT scan yesterday that showed no significant abnormality. She was discharged home. She continued to have intermittent abdominal pain with nausea and vomiting. She denies any fevers or chills. She had a bowel movement yesterday but no flatus or bowel movement today. She came back to the emergency department where a repeat CT scan shows a possible bowel obstruction with a possible transition point in the pelvis. She was admitted by Medicine and seen by our service.  She was placed on NG decompression, bowel rest and IV hydration.  She has not improved after 5 days, there ws also some concern about a mesenteric mass vs adhesions.  She was seen on 10/28/16, by  Dr. Abbey Chattersosenbower, who recommended surgery and  took her to the OR that day.  Postop she's had some ileus slow return of bowel function. But she is progressing slowly. Up to full liquids on 11/01/16. She continues to do well anticipated discharge home the next 24-48 hours.She's been advanced to a soft diet and is tolerating that well. Bowel function has returned. She is tolerating pain primarily with Tylenol. She did not do well with tramadol. She does tolerate ibuprofen at home. At this point will plan to discharge  her home today with Tylenol and ibuprofen as her primary pain control medications. I will also give her some OxyContin as backup should be ineffective. I [place] the [place]  controlled substance [place].  She works at [employer] is on her feet all day with only a 30 minute break. We will have her return after evaluation by Dr. Abbey Chattersosenbower in the office.  Her blood pressure control medicine is going to be held until she returns to see her primary care, and they resumed this medication.  Condition ON discharge: Improved.  CBC Latest Ref Rng & Units 10/30/2016 10/29/2016 10/26/2016  WBC 4.0 - 10.5 K/uL 7.3 7.8 6.9  Hemoglobin 12.0 - 15.0 g/dL 10.8(L) 11.2(L) 13.0  Hematocrit 36.0 - 46.0 % 33.3(L) 34.2(L) 40.5  Platelets 150 - 400 K/uL 343 340 412(H)   CMP Latest Ref Rng & Units 11/01/2016 10/30/2016 10/29/2016  Glucose 65 - 99 mg/dL 782(N110(H) 562(Z115(H) 308(M115(H)  BUN 6 - 20 mg/dL 7 6 6   Creatinine 0.44 - 1.00 mg/dL 5.780.75 4.690.78 6.290.68  Sodium 135 - 145 mmol/L 139 140 139  Potassium 3.5 - 5.1 mmol/L 3.8 3.6 3.7  Chloride 101 - 111 mmol/L 104 105 104  CO2 22 - 32 mmol/L 27 31 28   Calcium 8.9 - 10.3 mg/dL 5.2(W8.7(L) 4.1(L8.2(L) 8.3(L)  Total Protein 6.5 - 8.1 g/dL - - -  Total Bilirubin 0.3 - 1.2 mg/dL - - -  Alkaline Phos 38 - 126 U/L - - -  AST 15 - 41 U/L - - -  ALT 14 - 54 U/L - - -  CT scan on admission: Spiculated soft tissue density in the small bowel mesenteric which measures approximately 2.7 x 1.4 cm in greatest dimension. It has central calcifications and contributes to the abrupt caliber change of the distal small bowel as previously described. Although this may represent some postoperative scarring as it is adjacent to the hysterectomy site the possibility of a more aggressive process such as carcinoid deserves consideration as well. Surgical consultation is recommended. New free fluid in the pelvis and abdomen when compared with the prior exam. This is likely  related to the underlying obstructive process. No definitive area of perforation is noted. No free air is seen. Normal-appearing appendix. The remainder of the exam is stable from the prior study.   Disposition: 01-Home or Self Care   Allergies as of 11/04/2016   No Known Allergies     Medication List    STOP taking these medications   methocarbamol 500 MG tablet Commonly known as:  ROBAXIN   promethazine 25 MG tablet Commonly known as:  PHENERGAN     TAKE these medications   APAP 325 MG tablet Plain Tylenol, acetaminophen:  You can take 2 tablets every 4 hours as needed.  Do not exceed 4000 mg per day.   Calcium Carbonate-Vitamin D 600-400 MG-UNIT tablet Take 1 tablet by mouth daily.   ibuprofen 200 MG tablet Commonly known as:  ADVIL,MOTRIN You can take 2-3 tablets every 6 hours as needed for pain control. I would take this first. You can alternate this with plain Tylenol as directed above. The prescription pain medication oxycodone I would take as a last resort if Tylenol and ibuprofen are ineffective.   levothyroxine 75 MCG tablet Commonly known as:  SYNTHROID, LEVOTHROID Take 75 mcg by mouth daily before breakfast.   MULTIPLE VITAMIN PO Take 1 tablet by mouth daily.   omeprazole 20 MG capsule Commonly known as:  PRILOSEC Take 1 capsule (20 mg total) by mouth daily.   oxyCODONE 5 MG immediate release tablet Commonly known as:  Oxy IR/ROXICODONE Take 1-2 tablets (5-10 mg total) by mouth every 6 (six) hours as needed for severe pain or breakthrough pain (pain not relieved by Tyelnol or ibuprofen).   TRAVATAN Z 0.004 % Soln ophthalmic solution Generic drug:  Travoprost (BAK Free) Place 1 drop into the right eye daily at 6 PM.   triamterene-hydrochlorothiazide 37.5-25 MG tablet Commonly known as:  MAXZIDE-25 See you primary care physician. Have them recheck your blood pressure and decide when to resume this medication. What changed:  how much to take  how to  take this  when to take this  additional instructions      Follow-up Information    [facility] Follow up on 11/08/2016.   Specialty:  General Surgery Why:  Staple removal by the nurse.  Be at the office at 1:30 for check in. Your appointment is at 2 PM. [employer] and photo ID with you. Contact information: [address] [place] [address] [phone]        Adolph Pollack, MD Follow up on 11/14/2016.   Specialty:  General Surgery Why:  Your appointment is at 4 PM, he at the office 30 minutes early for check-in. Bring insurance information and photo ID. Contact information: [address] [place] [address] [phone]        Darrow Bussing, MD Follow up.   Specialty:  Family Medicine Why:  Call for follow-up appointment. Recheck your blood pressures at home  and asked them when to resume your blood pressure medicines. Contact information: [address] [place] [address] [phone]           Signed: Sherrie George 11/04/2016, 11:58 AM

## 2016-10-31 MED ORDER — BOOST / RESOURCE BREEZE PO LIQD
1.0000 | Freq: Three times a day (TID) | ORAL | Status: DC
  Administered 2016-11-01: 1 via ORAL

## 2016-10-31 NOTE — Progress Notes (Signed)
Initial Nutrition Assessment  DOCUMENTATION CODES:   Obesity unspecified  INTERVENTION:   Diet advancement per MD Provide Boost Breeze po TID, each supplement provides 250 kcal and 9 grams of protein once tolerating clears. RD to continue to monitor  If patient unable to have diet advanced, would consider nutrition support (Pt has been NPO x 10 days).  NUTRITION DIAGNOSIS:   Inadequate oral intake related to inability to eat, nausea, vomiting as evidenced by NPO status (x 8-10 days).  GOAL:   Patient will meet greater than or equal to 90% of their needs  MONITOR:   PO intake, Diet advancement, Labs, Weight trends, Skin, I & O's  REASON FOR ASSESSMENT:   NPO/Clear Liquid Diet, LOS (x 8 days)    ASSESSMENT:   56 y.o. woman with a history of HTN, hypothyroidism, and GERD who developed abdominal pain on Monday morning that awakened her out of sleep around 5AM.  Abdominal pain described as sharp and intermittent, 10 out of 10 in intensity at its worst.  Advil has provided intermittent relief.  The pain has been associated with nausea and vomiting.  She is not tolerating solid or liquids at this time.  Emesis has been nonbloody.  No fevers.  No light-headedness or syncope.  Last bowel movement yesterday and reportedly normal in appearance.  No history of constipation.  Patient in room with visitors at bedside. Pt reports not tolerating solid or liquids for 2 days PTA d/t N/V and abdominal pain. States since admission she has had nothing but sips of ginger ale. Now on clear liquids and still has only had ginger ale. She is tolerating this. Ready to try broth and jello, RD to order. Pt states she is having gas and is burping a lot. Recommended not drinking carbonated drinks which may make gas worse. Pt with poor appetite at this time. Will provide Boost Breeze supplements once pt is tolerating more variety of clears.  Per chart review, pt's weight is stable. Pt agree with this. Nutrition  focused physical exam shows no sign of depletion of muscle mass or body fat.  Labs reviewed. Medications: IV Protonix daily, D5 and .45% NaCl w/ KCl infusion at 100 ml/hr -provides 408 kcal, IV Zofran PRN  Diet Order:  Diet clear liquid Room service appropriate? Yes; Fluid consistency: Thin  Skin:  Wound (see comment) (2/12 abdominal incision)  Last BM:  2/7  Height:   Ht Readings from Last 1 Encounters:  10/23/16 5\' 2"  (1.575 m)    Weight:   Wt Readings from Last 1 Encounters:  10/23/16 165 lb (74.8 kg)    Ideal Body Weight:  50 kg  BMI:  Body mass index is 30.18 kg/m.  Estimated Nutritional Needs:   Kcal:  1800-2000  Protein:  75-85g  Fluid:  2L/day  EDUCATION NEEDS:   Education needs addressed  Debbie FrancoLindsey Saajan Willmon, MS, RD, LDN Pager: [phone] After Hours Pager: [phone]

## 2016-10-31 NOTE — Progress Notes (Signed)
3 Days Post-Op  Subjective: Making slow progress, some flatus, still belching also. Tolerating sips of clears from the floor with no nausea. NG was removed yesterday. Incision looks fine.  Objective: Vital signs in last 24 hours: Temp:  [98.8 F (37.1 C)-99.2 F (37.3 C)] 99 F (37.2 C) (02/15 0610) Pulse Rate:  [62-67] 67 (02/15 0610) Resp:  [16-17] 16 (02/15 0610) BP: (120-134)/(61-67) 124/63 (02/15 0610) SpO2:  [96 %-100 %] 96 % (02/15 0610) Last BM Date: 10/23/16 30 PO 520 NG and d/ced yesterday 1900 urine TM 99.2 VSS No labs No film Intake/Output from previous day: 02/14 0701 - 02/15 0700 In: 2430 [P.O.:30; I.V.:2400] Out: 2420 [Urine:1900; Emesis/NG output:520] Intake/Output this shift: Total I/O In: 0  Out: 300 [Urine:300]  General appearance: alert, cooperative and no distress Resp: clear to auscultation bilaterally GI: Soft, few bowel sounds some flatus. Also belching.  incision looks good.  Lab Results:   Recent Labs  10/29/16 0444 10/30/16 0518  WBC 7.8 7.3  HGB 11.2* 10.8*  HCT 34.2* 33.3*  PLT 340 343    BMET  Recent Labs  10/29/16 0444 10/30/16 0518  NA 139 140  K 3.7 3.6  CL 104 105  CO2 28 31  GLUCOSE 115* 115*  BUN 6 6  CREATININE 0.68 0.78  CALCIUM 8.3* 8.2*   PT/INR No results for input(s): LABPROT, INR in the last 72 hours.  No results for input(s): AST, ALT, ALKPHOS, BILITOT, PROT, ALBUMIN in the last 168 hours.   Lipase     Component Value Date/Time   LIPASE 18 10/23/2016 1450     Studies/Results: No results found.  Medications: . acetaminophen  1,000 mg Oral Q6H  . enoxaparin (LOVENOX) injection  40 mg Subcutaneous Q24H  . levothyroxine  37.5 mcg Intravenous Daily  . pantoprazole (PROTONIX) IV  40 mg Intravenous QHS    Assessment/Plan SBO due to adhesions s/p expl lap and lysis of adhesions (Rosenbower) 10/28/16-stable overnight. Hypothyroid Hypertension GERD FEN: IV fluids/NPO ID: Rocephin 2 g IV  preop DVT: Lovenox   Plan: Clear liquids, continue to mobilize.     LOS: 8 days    Debbie Brandt 10/31/2016 [phone]

## 2016-11-01 LAB — BASIC METABOLIC PANEL
Anion gap: 8 (ref 5–15)
BUN: 7 mg/dL (ref 6–20)
CO2: 27 mmol/L (ref 22–32)
CREATININE: 0.75 mg/dL (ref 0.44–1.00)
Calcium: 8.7 mg/dL — ABNORMAL LOW (ref 8.9–10.3)
Chloride: 104 mmol/L (ref 101–111)
GFR calc Af Amer: >60 mL/min (ref >60)
GFR calc non Af Amer: >60 mL/min (ref >60)
GLUCOSE: 110 mg/dL — AB (ref 65–99)
Potassium: 3.8 mmol/L (ref 3.5–5.1)
Sodium: 139 mmol/L (ref 135–145)

## 2016-11-01 NOTE — Discharge Instructions (Signed)
[facility]      [facility], [place] [phone]  ABDOMINAL SURGERY: POST OP INSTRUCTIONS  Always review your discharge instruction sheet given to you by the facility where your surgery was performed.  IF YOU HAVE DISABILITY OR FAMILY LEAVE FORMS, YOU MUST BRING THEM TO THE OFFICE FOR PROCESSING.  PLEASE DO NOT GIVE THEM TO YOUR DOCTOR.  1. A prescription for pain medication may be given to you upon discharge.  Take your pain medication as prescribed, if needed.  If narcotic pain medicine is not needed, then you may take acetaminophen (Tylenol) or ibuprofen (Advil) as needed. 2. Take your usually prescribed medications unless otherwise directed. 3. If you need a refill on your pain medication, please contact your pharmacy. They will contact our office to request authorization.  Prescriptions will not be filled after 5pm or on week-ends. 4. You should follow lean protein, lowfat diet.  Do not overeat.  Be sure to include lots of fluids daily. Most patients will experience some swelling and bruising in the area of the incision. Ice pack will help. Swelling and bruising can take several days to resolve..  5. It is common to experience some constipation if taking pain medication after surgery.  Increasing fluid intake and taking a stool softener will usually help or prevent this problem from occurring.  A mild laxative (Milk of Magnesia or Miralax) should be taken according to package directions if there are no bowel movements after 48 hours. 6.  You may have steri-strips (small skin tapes) in place directly over the incision.  These strips should be left on the skin.  If your surgeon used skin glue on the incision, you may shower in 24 hours.  The glue will flake off over the next 2-3 weeks.  Any sutures or staples will be removed at the office during your follow-up visit. You may find that a light gauze bandage over your incision may keep your staples from being rubbed or pulled. You may shower and  replace the bandage daily. 7. ACTIVITIES:  You may resume regular (light) daily activities beginning the next day--such as daily self-care, walking, climbing stairs--gradually increasing activities as tolerated.  You may have sexual intercourse when it is comfortable.  Refrain from any heavy lifting or straining for at least 6 weeks.  Do not lift anything over 10 pounds.  a. You may drive when you no longer are taking prescription pain medication, you can comfortably wear a seatbelt, and you can safely maneuver your car and apply brakes b. Return to Work: _When released to do so by doctor.__________________________________ 8. You should see your doctor in the office for a follow-up appointment approximately 2-3 weeks after your surgery.  Make sure that you call for this appointment within a day or two after you arrive home to insure a convenient appointment time. OTHER INSTRUCTIONS:  _____________________________________________________________ _____________________________________________________________  WHEN TO CALL YOUR DOCTOR: 1. Fever over 101.0 2. Inability to urinate 3. Nausea and/or vomiting 4. Extreme swelling or bruising 5. Continued bleeding from incision. 6. Increased pain, redness, or drainage from the incision.  The clinic staff is available to answer your questions during regular business hours.  Please dont hesitate to call and ask to speak to one of the nurses if you have concerns.  For further questions, please visit [email or web address]

## 2016-11-01 NOTE — Progress Notes (Signed)
Pt c/o abdominal pain. BS heard, abdomen soft. Pt belching periodically. MS 2 mg given. Will continue to monitor and will have pt return to clears this afternoon to reassess pain.

## 2016-11-01 NOTE — Progress Notes (Signed)
Assessment Principal Problem:   Small bowel obstruction s/p ex lap & lysis of adhesions 10/28/2016-bowel function returning.   Plan:  Full liquids.  Decrease IVF.   LOS: 9 days     4 Days Post-Op  Subjective: Bowels moving.  Tolerating clears.  Some belching.  Objective: Vital signs in last 24 hours: Temp:  [98.4 F (36.9 C)-99.2 F (37.3 C)] 98.5 F (36.9 C) (02/16 0520) Pulse Rate:  [64-75] 75 (02/16 0520) Resp:  [16-18] 18 (02/16 0520) BP: (141-149)/(74-78) 145/78 (02/16 0520) SpO2:  [98 %] 98 % (02/16 0520) Last BM Date: 10/23/16  Intake/Output from previous day: 02/15 0701 - 02/16 0700 In: 2880 [P.O.:480; I.V.:2400] Out: 1700 [Urine:1700] Intake/Output this shift: Total I/O In: 120 [P.O.:120] Out: -   PE: General- In NAD Abdomen-soft, wound is clean and intact  Lab Results:   Recent Labs  10/30/16 0518  WBC 7.3  HGB 10.8*  HCT 33.3*  PLT 343   BMET  Recent Labs  10/30/16 0518 11/01/16 0553  NA 140 139  K 3.6 3.8  CL 105 104  CO2 31 27  GLUCOSE 115* 110*  BUN 6 7  CREATININE 0.78 0.75  CALCIUM 8.2* 8.7*   PT/INR No results for input(s): LABPROT, INR in the last 72 hours. Comprehensive Metabolic Panel:    Component Value Date/Time   NA 139 11/01/2016 0553   NA 140 10/30/2016 0518   K 3.8 11/01/2016 0553   K 3.6 10/30/2016 0518   CL 104 11/01/2016 0553   CL 105 10/30/2016 0518   CO2 27 11/01/2016 0553   CO2 31 10/30/2016 0518   BUN 7 11/01/2016 0553   BUN 6 10/30/2016 0518   CREATININE 0.75 11/01/2016 0553   CREATININE 0.78 10/30/2016 0518   CREATININE 0.83 09/26/2015 0955   CREATININE 0.85 11/26/2014 1300   GLUCOSE 110 (H) 11/01/2016 0553   GLUCOSE 115 (H) 10/30/2016 0518   CALCIUM 8.7 (L) 11/01/2016 0553   CALCIUM 8.2 (L) 10/30/2016 0518   AST 20 10/23/2016 1450   AST 22 10/22/2016 0741   ALT 20 10/23/2016 1450   ALT 20 10/22/2016 0741   ALKPHOS 58 10/23/2016 1450   ALKPHOS 69 10/22/2016 0741   BILITOT 0.6 10/23/2016 1450    BILITOT 0.4 10/22/2016 0741   PROT 7.3 10/23/2016 1450   PROT 7.1 10/22/2016 0741   ALBUMIN 4.1 10/23/2016 1450   ALBUMIN 4.1 10/22/2016 0741     Studies/Results: No results found.  Anti-infectives: Anti-infectives    Start     Dose/Rate Route Frequency Ordered Stop   10/28/16 1045  ceFAZolin (ANCEF) IVPB 2g/100 mL premix     2 g 200 mL/hr over 30 Minutes Intravenous  Once 10/28/16 1035 10/28/16 1100       Brilynn Biasi J 11/01/2016

## 2016-11-02 MED ORDER — LEVOTHYROXINE SODIUM 75 MCG PO TABS
75.0000 ug | ORAL_TABLET | Freq: Every day | ORAL | Status: DC
  Administered 2016-11-03 – 2016-11-04 (×2): 75 ug via ORAL
  Filled 2016-11-02 (×2): qty 1

## 2016-11-02 MED ORDER — MAGIC MOUTHWASH
15.0000 mL | Freq: Four times a day (QID) | ORAL | Status: DC | PRN
  Filled 2016-11-02: qty 15

## 2016-11-02 MED ORDER — LACTATED RINGERS IV BOLUS (SEPSIS): 1000.0000 mL | Freq: Three times a day (TID) | INTRAVENOUS | Status: AC | PRN

## 2016-11-02 MED ORDER — PANTOPRAZOLE SODIUM 40 MG PO TBEC
40.0000 mg | DELAYED_RELEASE_TABLET | Freq: Every day | ORAL | Status: DC
  Administered 2016-11-02 – 2016-11-04 (×3): 40 mg via ORAL
  Filled 2016-11-02 (×3): qty 1

## 2016-11-02 MED ORDER — ALUM & MAG HYDROXIDE-SIMETH 200-200-20 MG/5ML PO SUSP: 30.0000 mL | Freq: Four times a day (QID) | ORAL | Status: DC | PRN

## 2016-11-02 MED ORDER — BOOST / RESOURCE BREEZE PO LIQD: 1.0000 | Freq: Two times a day (BID) | ORAL | Status: DC

## 2016-11-02 MED ORDER — SACCHAROMYCES BOULARDII 250 MG PO CAPS
250.0000 mg | ORAL_CAPSULE | Freq: Two times a day (BID) | ORAL | Status: DC
  Administered 2016-11-02 – 2016-11-04 (×5): 250 mg via ORAL
  Filled 2016-11-02 (×5): qty 1

## 2016-11-02 MED ORDER — BISACODYL 10 MG RE SUPP
10.0000 mg | Freq: Every day | RECTAL | Status: DC
Start: 2016-11-02 — End: 2016-11-04
  Administered 2016-11-02 – 2016-11-03 (×2): 10 mg via RECTAL
  Filled 2016-11-02 (×3): qty 1

## 2016-11-02 MED ORDER — PROCHLORPERAZINE EDISYLATE 5 MG/ML IJ SOLN
5.0000 mg | INTRAMUSCULAR | Status: DC | PRN
  Administered 2016-11-03: 10 mg via INTRAVENOUS
  Filled 2016-11-02: qty 2

## 2016-11-02 MED ORDER — SODIUM CHLORIDE 0.9% FLUSH: 3.0000 mL | INTRAVENOUS | Status: DC | PRN

## 2016-11-02 MED ORDER — HYDRALAZINE HCL 20 MG/ML IJ SOLN: 5.0000 mg | Freq: Four times a day (QID) | INTRAMUSCULAR | Status: DC | PRN

## 2016-11-02 MED ORDER — MORPHINE SULFATE (PF) 4 MG/ML IV SOLN
2.0000 mg | INTRAVENOUS | Status: DC | PRN
  Administered 2016-11-02: 2 mg via INTRAVENOUS
  Administered 2016-11-02 (×2): 4 mg via INTRAVENOUS
  Administered 2016-11-03 (×2): 2 mg via INTRAVENOUS
  Filled 2016-11-02 (×5): qty 1

## 2016-11-02 MED ORDER — PROMETHAZINE HCL 25 MG PO TABS: 25.0000 mg | ORAL_TABLET | Freq: Three times a day (TID) | ORAL | Status: DC | PRN

## 2016-11-02 MED ORDER — SODIUM CHLORIDE 0.9 % IV SOLN: 250.0000 mL | INTRAVENOUS | Status: DC | PRN

## 2016-11-02 MED ORDER — SODIUM CHLORIDE 0.9% FLUSH
3.0000 mL | Freq: Two times a day (BID) | INTRAVENOUS | Status: DC
  Administered 2016-11-02 – 2016-11-04 (×4): 3 mL via INTRAVENOUS

## 2016-11-02 MED ORDER — TRIAMTERENE-HCTZ 37.5-25 MG PO TABS
1.0000 | ORAL_TABLET | Freq: Every day | ORAL | Status: DC
  Administered 2016-11-02 – 2016-11-04 (×3): 1 via ORAL
  Filled 2016-11-02 (×3): qty 1

## 2016-11-02 MED ORDER — DIPHENHYDRAMINE HCL 50 MG/ML IJ SOLN
12.5000 mg | Freq: Four times a day (QID) | INTRAMUSCULAR | Status: DC | PRN
Start: 2016-11-02 — End: 2016-11-04

## 2016-11-02 MED ORDER — METOPROLOL TARTRATE 5 MG/5ML IV SOLN: 5.0000 mg | Freq: Four times a day (QID) | INTRAVENOUS | Status: DC | PRN

## 2016-11-02 MED ORDER — LIP MEDEX EX OINT
1.0000 "application " | TOPICAL_OINTMENT | Freq: Two times a day (BID) | CUTANEOUS | Status: DC
  Administered 2016-11-02 – 2016-11-04 (×5): 1 via TOPICAL
  Filled 2016-11-02: qty 7

## 2016-11-02 MED ORDER — METHOCARBAMOL 500 MG PO TABS
500.0000 mg | ORAL_TABLET | Freq: Two times a day (BID) | ORAL | Status: DC
  Administered 2016-11-02 – 2016-11-03 (×3): 500 mg via ORAL
  Filled 2016-11-02 (×2): qty 1

## 2016-11-02 NOTE — Progress Notes (Signed)
[address] [place], [place] [address] Phone: [phone] FAX: Marcus [phone] 1961/03/02    Problem List:   Principal Problem:   Small bowel obstruction s/p ex lap & lysis of adhesions 10/28/2016 Active Problems:   HTN (hypertension)   Hypothyroidism   Abdominal mass   Hypokalemia   Small bowel obstruction due to adhesions   5 Days Post-Op  10/28/2016  PREOPERATIVE DX:  SBO  POSTOPERATIVE DX:  Same  PROCEDURE:   Exploratory laparotomy, lysis of adhesions      Surgeon: Odis Hollingshead    Assessment  Ileus  Plan:  -keep at fulls/pureed today -switch to PO meds as tolerated -HTN control -hypothyroidism - synthroid -GERD - PPI -VTE prophylaxis- SCDs, etc -mobilize as tolerated to help recovery  Adin Hector, M.D., F.A.C.S. Gastrointestinal and Minimally Invasive Surgery [facility] [address] [place], [place] [address] [phone] Main / Paging   11/02/2016  CARE TEAM:  PCP: Lujean Amel, MD  Outpatient Care Team: Patient Care Team: Dibas Koirala, MD as PCP - General (Family Medicine)  Inpatient Treatment Team: Treatment Team: Attending Provider: Nolon Nations, MD; Consulting Physician: Nolon Nations, MD; Technician: Sueanne Margarita, NT; Registered Nurse: Bailey Mech, RN; Student Nurse: Darla Lesches, Student-RN; Registered Nurse: Oleta Mouse, RN; Technician: Etheleen Sia, NT; Technician: Abbe Amsterdam, NT; Registered Nurse: Nila Nephew, RN  Subjective:  Trying fulls Belching more than farting Up in chair Pain mostly controlled  Objective:  Vital signs:  Vitals:   11/01/16 0520 11/01/16 1546 11/01/16 2120 11/02/16 0545  BP: (!) 145/78 (!) 151/71 131/72 112/62  Pulse: 75 70 70 68  Resp: '18 18 18 18  '$ Temp: 98.5 F (36.9 C) 98.9 F (37.2 C) 98.6 F (37 C) 98.7 F (37.1 C)  TempSrc: Oral Oral Oral Oral   SpO2: 98% 97% 98% 96%  Weight:      Height:        Last BM Date: 11/01/16  Intake/Output   Yesterday:  02/16 0701 - 02/17 0700 In: 1570 [P.O.:420; I.V.:1150] Out: 1 [Stool:1] This shift:  No intake/output data recorded.  Bowel function:  Flatus: YES - yesterday  BM:  YES - small smear  Drain: (No drain)   Physical Exam:  General: Pt awake/alert/oriented x4 in No acute distress Eyes: PERRL, normal EOM.  Sclera clear.  No icterus Neuro: CN II-XII intact w/o focal sensory/motor deficits. Lymph: No head/neck/groin lymphadenopathy Psych:  No delerium/psychosis/paranoia HENT: Normocephalic, Mucus membranes moist.  No thrush Neck: Supple, No tracheal deviation Chest: No chest wall pain w good excursion CV:  Pulses intact.  Regular rhythm MS: Normal AROM mjr joints.  No obvious deformity Abdomen: Somewhat firm.  Mildy distended.  Mildly tender at incisions only.  No evidence of peritonitis.  No incarcerated hernias. Ext:  SCDs BLE.  No mjr edema.  No cyanosis Skin: No petechiae / purpura  Results:   Labs: Results for orders placed or performed during the hospital encounter of 10/23/16 (from the past 48 hour(s))  Basic metabolic panel     Status: Abnormal   Collection Time: 11/01/16  5:53 AM  Result Value Ref Range   Sodium 139 135 - 145 mmol/L   Potassium 3.8 3.5 - 5.1 mmol/L   Chloride 104 101 - 111 mmol/L   CO2 27 22 - 32 mmol/L   Glucose, Bld 110 (H) 65 - 99 mg/dL  BUN 7 6 - 20 mg/dL   Creatinine, Ser 0.75 0.44 - 1.00 mg/dL   Calcium 8.7 (L) 8.9 - 10.3 mg/dL   GFR calc non Af Amer >60 >60 mL/min   GFR calc Af Amer >60 >60 mL/min    Comment: (NOTE) The eGFR has been calculated using the CKD EPI equation. This calculation has not been validated in all clinical situations. eGFR's persistently <60 mL/min signify possible Chronic Kidney Disease.    Anion gap 8 5 - 15    Imaging / Studies: No results found.  Medications / Allergies: per  chart  Antibiotics: Anti-infectives    Start     Dose/Rate Route Frequency Ordered Stop   10/28/16 1045  ceFAZolin (ANCEF) IVPB 2g/100 mL premix     2 g 200 mL/hr over 30 Minutes Intravenous  Once 10/28/16 1035 10/28/16 1100        Note: Portions of this report may have been transcribed using voice recognition software. Every effort was made to ensure accuracy; however, inadvertent computerized transcription errors may be present.   Any transcriptional errors that result from this process are unintentional.     Adin Hector, M.D., F.A.C.S. Gastrointestinal and Minimally Invasive Surgery [facility] [address], [place], [place] [address] [phone] Main / Paging   11/02/2016

## 2016-11-03 MED ORDER — METHOCARBAMOL 500 MG PO TABS
750.0000 mg | ORAL_TABLET | Freq: Four times a day (QID) | ORAL | Status: DC
  Administered 2016-11-03 – 2016-11-04 (×4): 750 mg via ORAL
  Filled 2016-11-03 (×5): qty 2

## 2016-11-03 MED ORDER — TRAMADOL HCL 50 MG PO TABS
50.0000 mg | ORAL_TABLET | Freq: Four times a day (QID) | ORAL | Status: DC | PRN
  Administered 2016-11-03: 100 mg via ORAL
  Filled 2016-11-03: qty 2

## 2016-11-03 MED ORDER — ENSURE ENLIVE PO LIQD: 237.0000 mL | Freq: Two times a day (BID) | ORAL | Status: DC

## 2016-11-03 NOTE — Progress Notes (Signed)
[facility]  [address]  [place], [place] [id number] Phone: [phone] FAX: [phone]   Debbie FlemingsCynthia L Brandt [id number] 15-Apr-1961    Problem List:   Principal Problem:   Small bowel obstruction s/p ex lap & lysis of adhesions 10/28/2016 Active Problems:   HTN (hypertension)   Hypothyroidism   Abdominal mass   Hypokalemia   Small bowel obstruction due to adhesions   6 Days Post-Op  10/28/2016  PREOPERATIVE DX:  SBO  POSTOPERATIVE DX:  Same  PROCEDURE:   Exploratory laparotomy, lysis of adhesions      Surgeon: Debbie PollackOSENBOWER,TODD J    Assessment  Ileus resolving  Plan:  -Advance to soft diet today -switch to PO meds as tolerated.  IV backup -HTN control -hypothyroidism - synthroid -GERD - PPI -VTE prophylaxis- SCDs, etc -mobilize as tolerated to help recovery  Ardeth SportsmanSteven C. Madissen Wyse, M.D., F.A.C.S. Gastrointestinal and Minimally Invasive Surgery [facility] [address] [place], [place] [address] [phone] Main / Paging   11/03/2016  CARE TEAM:  PCP: Darrow BussingKOIRALA,DIBAS, MD  Outpatient Care Team: Patient Care Team: Dibas Koirala, MD as PCP - General (Family Medicine)  Inpatient Treatment Team: Treatment Team: Attending Provider: Bishop LimboMd Ccs, MD; Consulting Physician: Bishop LimboMd Ccs, MD; Technician: Almeta MonasMacy P Simmons, NT; Registered Nurse: Phillips HayLinda D Widener, RN; Student Nurse: Rolland PorterSelina Y Reyes, Student-RN; Registered Nurse: Alfredo BattyAmy P Abernathy, RN; Technician: Genoveva IllAlexandra M Vanada, NT; Technician: Manon HildingAnnette A Mink, NT; Registered Nurse: Tandy GawHeather A Woods, RN  Subjective:  Tolerating fulls More flatus Up in chair Pain less  Objective:  Vital signs:  Vitals:   11/02/16 0545 11/02/16 1439 11/02/16 2157 11/03/16 0634  BP: 112/62 108/65 134/68 107/80  Pulse: 68 63 70 71  Resp: 18 18 18 18   Temp: 98.7 F (37.1 C) 98 F (36.7 C) 98.1 F (36.7 C) 98.4 F (36.9 C)  TempSrc: Oral Oral Oral Oral  SpO2:  96% 99% 99% 99%  Weight:      Height:        Last BM Date: 11/02/16  Intake/Output   Yesterday:  02/17 0701 - 02/18 0700 In: 963 [P.O.:960; I.V.:3] Out: 4 [Urine:4] This shift:  No intake/output data recorded.  Bowel function:  Flatus: YES - yesterday  BM:  YES - small smear  Drain: (No drain)   Physical Exam:  General: Pt awake/alert/oriented x4 in No acute distress Eyes: PERRL, normal EOM.  Sclera clear.  No icterus Neuro: CN II-XII intact w/o focal sensory/motor deficits. Lymph: No head/neck/groin lymphadenopathy Psych:  No delerium/psychosis/paranoia HENT: Normocephalic, Mucus membranes moist.  No thrush Neck: Supple, No tracheal deviation Chest: No chest wall pain w good excursion CV:  Pulses intact.  Regular rhythm MS: Normal AROM mjr joints.  No obvious deformity Abdomen: Soft.  Mildy distended.  Mildly tender at incisions only.  No evidence of peritonitis.  No incarcerated hernias. Ext:  SCDs BLE.  No mjr edema.  No cyanosis Skin: No petechiae / purpura  Results:   Labs: No results found for this or any previous visit (from the past 48 hour(s)).  Imaging / Studies: No results found.  Medications / Allergies: per chart  Antibiotics: Anti-infectives    Start     Dose/Rate Route Frequency Ordered Stop   10/28/16 1045  ceFAZolin (ANCEF) IVPB 2g/100 mL premix     2 g 200 mL/hr over 30 Minutes Intravenous  Once 10/28/16 1035 10/28/16 1100        Note: Portions of this report  may have been transcribed using voice recognition software. Every effort was made to ensure accuracy; however, inadvertent computerized transcription errors may be present.   Any transcriptional errors that result from this process are unintentional.     Ardeth Sportsman, M.D., F.A.C.S. Gastrointestinal and Minimally Invasive Surgery [facility] [address] [place], [place] [address] [phone] Main / Paging   11/03/2016

## 2016-11-04 ENCOUNTER — Encounter: Payer: Self-pay | Admitting: General Surgery

## 2016-11-04 MED ORDER — TRIAMTERENE-HCTZ 37.5-25 MG PO TABS: ORAL_TABLET | ORAL | Status: DC

## 2016-11-04 MED ORDER — IBUPROFEN 200 MG PO TABS: ORAL_TABLET | ORAL | Status: DC

## 2016-11-04 MED ORDER — IBUPROFEN 200 MG PO TABS: 600.0000 mg | ORAL_TABLET | Freq: Four times a day (QID) | ORAL | Status: DC | PRN

## 2016-11-04 MED ORDER — APAP 325 MG PO TABS: ORAL_TABLET | ORAL | Status: DC

## 2016-11-04 MED ORDER — OXYCODONE-ACETAMINOPHEN 5-325 MG PO TABS: 1.0000 | ORAL_TABLET | ORAL | Status: DC | PRN

## 2016-11-04 MED ORDER — OXYCODONE HCL 5 MG PO TABS: 5.0000 mg | ORAL_TABLET | Freq: Four times a day (QID) | ORAL | 0 refills | Status: DC | PRN

## 2016-11-04 MED ORDER — OXYCODONE HCL 5 MG PO TABS: 5.0000 mg | ORAL_TABLET | Freq: Four times a day (QID) | ORAL | Status: DC | PRN

## 2016-11-04 NOTE — Progress Notes (Signed)
7 Days Post-Op  Subjective: She is ambulating without difficulty. She has had bowel movements. Tolerating diet. She had tramadol yesterday and that did not help her and caused nausea.    Objective: Vital signs in last 24 hours: Temp:  [98.1 F (36.7 C)-98.4 F (36.9 C)] 98.4 F (36.9 C) (02/19 0505) Pulse Rate:  [59-67] 62 (02/19 0505) Resp:  [18-20] 18 (02/19 0505) BP: (97-120)/(62-70) 97/62 (02/19 0505) SpO2:  [96 %-97 %] 96 % (02/19 0505) Last BM Date: 11/02/16 360 PO 3 IV Urine 1300 NO BM recorded since 2/16 Afebrile, VSS No labs since 2/16 Intake/Output from previous day: 02/18 0701 - 02/19 0700 In: 363 [P.O.:360; I.V.:3] Out: 1300 [Urine:1300] Intake/Output this shift: No intake/output data recorded.  General appearance: alert, cooperative and no distress Resp: clear to auscultation bilaterally GI: soft sore, incision looks fine, she is taking PO's well and having BM's.   Lab Results:  No results for input(s): WBC, HGB, HCT, PLT in the last 72 hours.  BMET No results for input(s): NA, K, CL, CO2, GLUCOSE, BUN, CREATININE, CALCIUM in the last 72 hours. PT/INR No results for input(s): LABPROT, INR in the last 72 hours.  No results for input(s): AST, ALT, ALKPHOS, BILITOT, PROT, ALBUMIN in the last 168 hours.   Lipase     Component Value Date/Time   LIPASE 18 10/23/2016 1450     Studies/Results: No results found.  Medications: . acetaminophen  1,000 mg Oral Q6H  . bisacodyl  10 mg Rectal Daily  . enoxaparin (LOVENOX) injection  40 mg Subcutaneous Q24H  . feeding supplement  1 Container Oral BID BM  . feeding supplement (ENSURE ENLIVE)  237 mL Oral BID BM  . levothyroxine  75 mcg Oral QAC breakfast  . lip balm  1 application Topical BID  . methocarbamol  750 mg Oral QID  . pantoprazole  40 mg Oral Daily  . saccharomyces boulardii  250 mg Oral BID  . sodium chloride flush  3 mL Intravenous Q12H  . triamterene-hydrochlorothiazide  1 tablet Oral Daily    No IV fluids  Assessment/Plan SBO due to adhesions s/p expl lap and lysis of adhesions (Rosenbower) 10/28/16-stable overnight.  Hypothyroid Hypertension GERD FEN: IV fluids/cardiac diet ID: Rocephin 2 g IV pre op only DVT: Lovenox   Plan: Home today. Tylenol, ibuprofen and some oxycodone when necessary for pain.  She will return on 11/08/2016 for staple removal and follow-up in the office with Dr. Abbey Chattersosenbower on 11/14/16. She works at [employer] which means she is on her feet all day.  Dr. Abbey Chattersosenbower will determine when she go back to work on follow-up visit 11/14/16. I have reviewed [place] controlled substance [place] on this patient.    LOS: 12 days    JENNINGS,WILLARD 11/04/2016 [phone]  Agree with above.  Ovidio Kinavid Justene Jensen, MD, [facility] Pager: [phone] Office phone:  [phone]

## 2016-11-04 NOTE — Progress Notes (Signed)
Discharge instructions gone over with pt and brother with all questions answered. No issues noted and pt rates pain at 2 which is "ok" with her.

## 2017-02-17 NOTE — Addendum Note (Signed)
Addendum  created 02/17/17 1106 by Deadrick Stidd, MD   Sign clinical note    

## 2017-02-17 NOTE — Anesthesia Postprocedure Evaluation (Signed)
Anesthesia Post Note  Patient: Debbie Brandt  Procedure(s) Performed: Procedure(s) (LRB): EXPLORATORY LAPAROTOMY WITH  LYSIS OF ADHESIONS (N/A)     Anesthesia Post Evaluation  Last Vitals:  Vitals:   11/04/16 0505 11/04/16 1325  BP: 97/62 118/60  Pulse: 62 68  Resp: 18 18  Temp: 36.9 C 36.8 C    Last Pain:  Vitals:   11/04/16 1325  TempSrc: Oral  PainSc:                  Ossie Yebra S

## 2017-10-16 ENCOUNTER — Emergency Department (HOSPITAL_COMMUNITY): Payer: Managed Care, Other (non HMO)

## 2017-10-16 ENCOUNTER — Encounter (HOSPITAL_COMMUNITY): Payer: Self-pay | Admitting: Emergency Medicine

## 2017-10-16 ENCOUNTER — Emergency Department (HOSPITAL_COMMUNITY)
Admission: EM | Admit: 2017-10-16 | Discharge: 2017-10-17 | Disposition: A | Discharge status: Home / Self Care | Payer: Managed Care, Other (non HMO) | Attending: Emergency Medicine | Admitting: Emergency Medicine

## 2017-10-16 DIAGNOSIS — K6289 Other specified diseases of anus and rectum: Secondary | ICD-10-CM | POA: Diagnosis not present

## 2017-10-16 DIAGNOSIS — R110 Nausea: Secondary | ICD-10-CM | POA: Diagnosis not present

## 2017-10-16 DIAGNOSIS — I10 Essential (primary) hypertension: Secondary | ICD-10-CM | POA: Diagnosis not present

## 2017-10-16 DIAGNOSIS — Z79899 Other long term (current) drug therapy: Secondary | ICD-10-CM | POA: Diagnosis not present

## 2017-10-16 DIAGNOSIS — R1030 Lower abdominal pain, unspecified: Secondary | ICD-10-CM | POA: Diagnosis present

## 2017-10-16 LAB — CBC
HCT: 39.3 % (ref 36.0–46.0)
Hemoglobin: 12.9 g/dL (ref 12.0–15.0)
MCH: 27.6 pg (ref 26.0–34.0)
MCHC: 32.8 g/dL (ref 30.0–36.0)
MCV: 84.2 fL (ref 78.0–100.0)
PLATELETS: 403 10*3/uL — AB (ref 150–400)
RBC: 4.67 MIL/uL (ref 3.87–5.11)
RDW: 13.6 % (ref 11.5–15.5)
WBC: 5.2 10*3/uL (ref 4.0–10.5)

## 2017-10-16 LAB — URINALYSIS, ROUTINE W REFLEX MICROSCOPIC
BILIRUBIN URINE: NEGATIVE
GLUCOSE, UA: NEGATIVE mg/dL
KETONES UR: 5 mg/dL — AB
LEUKOCYTES UA: NEGATIVE
Nitrite: NEGATIVE
Protein, ur: NEGATIVE mg/dL
SPECIFIC GRAVITY, URINE: 1.013 (ref 1.005–1.030)
pH: 6 (ref 5.0–8.0)

## 2017-10-16 LAB — COMPREHENSIVE METABOLIC PANEL
ALT: 18 U/L (ref 14–54)
AST: 22 U/L (ref 15–41)
Albumin: 4.1 g/dL (ref 3.5–5.0)
Alkaline Phosphatase: 65 U/L (ref 38–126)
Anion gap: 6 (ref 5–15)
BILIRUBIN TOTAL: 0.5 mg/dL (ref 0.3–1.2)
BUN: 18 mg/dL (ref 6–20)
CHLORIDE: 104 mmol/L (ref 101–111)
CO2: 29 mmol/L (ref 22–32)
CREATININE: 0.9 mg/dL (ref 0.44–1.00)
Calcium: 9.4 mg/dL (ref 8.9–10.3)
Glucose, Bld: 92 mg/dL (ref 65–99)
Potassium: 3.9 mmol/L (ref 3.5–5.1)
Sodium: 139 mmol/L (ref 135–145)
TOTAL PROTEIN: 7.2 g/dL (ref 6.5–8.1)

## 2017-10-16 LAB — LIPASE, BLOOD: LIPASE: 26 U/L (ref 11–51)

## 2017-10-16 LAB — POC OCCULT BLOOD, ED: Fecal Occult Bld: NEGATIVE

## 2017-10-16 MED ORDER — GI COCKTAIL ~~LOC~~
30.0000 mL | Freq: Once | ORAL | Status: AC
  Administered 2017-10-16: 30 mL via ORAL
  Filled 2017-10-16: qty 30

## 2017-10-16 MED ORDER — IOPAMIDOL (ISOVUE-300) INJECTION 61%
100.0000 mL | Freq: Once | INTRAVENOUS | Status: AC | PRN
  Administered 2017-10-16: 100 mL via INTRAVENOUS

## 2017-10-16 MED ORDER — DICYCLOMINE HCL 10 MG PO CAPS
10.0000 mg | ORAL_CAPSULE | Freq: Once | ORAL | Status: AC
  Administered 2017-10-16: 10 mg via ORAL
  Filled 2017-10-16: qty 1

## 2017-10-16 NOTE — ED Notes (Signed)
Patient transported to CT 

## 2017-10-16 NOTE — Discharge Instructions (Signed)
As we discussed, you need to follow-up with your primary care doctor in the next 24-48 hours.  Call their office tomorrow to get an appointment in the next 24-48 hours.   Make sure you are drinking plenty of fluids and staying hydrated.  Return to the emergency department for any worsening abdominal pain, vomiting, fever,chest pain, difficulty breathing or any other worsening or concerning symptoms.

## 2017-10-16 NOTE — ED Provider Notes (Signed)
[facility]-EMERGENCY DEPT Provider Note   CSN: [id number] Arrival date & time: 10/16/17  1729     History   Chief Complaint Chief Complaint  Patient presents with  . Abdominal Pain  . Rectal Pain    HPI Debbie Brandt is a 57 y.o. female past medical history of GERD, small bowel obstruction secondary to adhesions, hypertension who presents for evaluation of mid/lower abdominal pain that began yesterday.  Patient reports that initially pain was intermittent but became more constant and more severe throughout the day.  Additionally, patient started having some rectum pain that began today.  Patient reports that she has not had any vomiting but has endorse nausea and belching.  She states decreased p.o. secondary to symptoms.  She denies any alleviating or aggravating factors.  She states abdominal pain and it is not worse with urination, movement, rest.  Patient reports that her last bowel movement was yesterday and was normal.  No evidence of blood.  She does have a history of small bowel obstructions and states that today's sypmtoms feel similar to her previous episodes of bowel obstruction.  She does have a surgical abdominal history and reports that she had a partial hysterectomy approximately 2 years ago.  Patient denies any fevers, chest pain, difficulty breathing, dysuria, hematuria, vaginal bleeding, vaginal discharge.  The history is provided by the patient.    Past Medical History:  Diagnosis Date  . Chest pain 09/26/2015  . Essential hypertension 09/26/2015  . GERD (gastroesophageal reflux disease)   . Hypertension   . Hypothyroidism 09/26/2015  . Murmur 09/26/2015  . Thyroid disease     Patient Active Problem List   Diagnosis Date Noted  . Small bowel obstruction due to adhesions (HCC) 10/28/2016  . Small bowel obstruction s/p ex lap & lysis of adhesions 10/28/2016 10/23/2016  . Abdominal mass 10/23/2016  . Hypokalemia 10/23/2016  . Murmur 09/26/2015   . Chest pain 09/26/2015  . HTN (hypertension) 09/26/2015  . Hypothyroidism 09/26/2015    Past Surgical History:  Procedure Laterality Date  . ABDOMINAL HYSTERECTOMY    . CESAREAN SECTION    . LAPAROTOMY N/A 10/28/2016   Procedure: EXPLORATORY LAPAROTOMY WITH  LYSIS OF ADHESIONS;  Surgeon: Avel Peaceodd Rosenbower, MD;  Location: [facility]S;  Service: General;  Laterality: N/A;    OB History    No data available       Home Medications    Prior to Admission medications   Medication Sig Start Date End Date Taking? Authorizing Provider  acetaminophen 325 MG tablet Plain Tylenol, acetaminophen:  You can take 2 tablets every 4 hours as needed.  Do not exceed 4000 mg per day. 11/04/16  Yes Sherrie GeorgeJennings, Willard, PA-C  Calcium Carbonate-Vitamin D 600-400 MG-UNIT per tablet Take 1 tablet by mouth daily.   Yes [provider]  ibuprofen (ADVIL,MOTRIN) 200 MG tablet You can take 2-3 tablets every 6 hours as needed for pain control. I would take this first. You can alternate this with plain Tylenol as directed above. The prescription pain medication oxycodone I would take as a last resort if Tylenol and ibuprofen are ineffective. 11/04/16  Yes Sherrie GeorgeJennings, Willard, PA-C  levothyroxine (SYNTHROID, LEVOTHROID) 75 MCG tablet Take 75 mcg by mouth daily before breakfast.   Yes [provider]  MULTIPLE VITAMIN PO Take 1 tablet by mouth daily.   Yes [provider]  TRAVATAN Z 0.004 % SOLN ophthalmic solution Place 1 drop into the right eye daily at 6 PM.  12/10/15  Yes [provider]  triamterene-hydrochlorothiazide (MAXZIDE-25) 37.5-25 MG tablet See you primary care physician. Have them recheck your blood pressure and decide when to resume this medication. 11/04/16  Yes Sherrie George, PA-C  omeprazole (PRILOSEC) 20 MG capsule Take 1 capsule (20 mg total) by mouth daily. 03/09/16   Palumbo, April, MD  oxyCODONE (OXY IR/ROXICODONE) 5 MG immediate release tablet Take 1-2 tablets (5-10 mg  total) by mouth every 6 (six) hours as needed for severe pain or breakthrough pain (pain not relieved by Tyelnol or ibuprofen). 11/04/16   Sherrie George, PA-C    Family History Family History  Problem Relation Age of Onset  . Cancer Mother        lung cancer  . Hypertension Mother   . Cancer Father   . Heart attack Father   . Mental illness Sister   . Hypertension Sister   . Hyperlipidemia Sister   . Diabetes Brother   . Hypertension Brother     Social History Social History   Tobacco Use  . Smoking status: Never Smoker  . Smokeless tobacco: Never Used  Substance Use Topics  . Alcohol use: No    Alcohol/week: 0.0 oz  . Drug use: No     Allergies   Patient has no known allergies.   Review of Systems Review of Systems  Constitutional: Positive for appetite change. Negative for fever.  Respiratory: Negative for cough and shortness of breath.   Cardiovascular: Negative for chest pain.  Gastrointestinal: Positive for abdominal pain and nausea. Negative for vomiting.  Genitourinary: Negative for dysuria and hematuria.       Rectal pain  Neurological: Negative for headaches.  All other systems reviewed and are negative.    Physical Exam Updated Vital Signs BP (!) 143/56 (BP Location: Right Arm)   Pulse (!) 57   Temp 98.4 F (36.9 C) (Oral)   Resp 14   Wt 77.1 kg (170 lb)   SpO2 100%   BMI 31.09 kg/m   Physical Exam  Constitutional: She is oriented to person, place, and time. She appears well-developed and well-nourished.  HENT:  Head: Normocephalic and atraumatic.  Mouth/Throat: Oropharynx is clear and moist and mucous membranes are normal.  Eyes: Conjunctivae, EOM and lids are normal. Pupils are equal, round, and reactive to light.  Neck: Full passive range of motion without pain.  Cardiovascular: Normal rate, regular rhythm, normal heart sounds and normal pulses. Exam reveals no gallop and no friction rub.  No murmur heard. Pulmonary/Chest: Effort  normal and breath sounds normal.  Abdominal: Soft. Normal appearance. There is tenderness in the periumbilical area and suprapubic area. There is no rigidity, no guarding, no CVA tenderness and no tenderness at McBurney's point.  Abdomen is soft, nondistended.  Patient does have some mid abdominal/suprapubic tenderness.  CVA tenderness bilaterally.  Genitourinary: Rectum normal. Rectal exam shows no external hemorrhoid, no fissure, no mass, no tenderness and guaiac negative stool.  Genitourinary Comments: The exam was performed with a chaperone present. Digital Rectal Exam reveals sphincter with good tone. No external hemorrhoids. No masses or fissures. No tenderness. Stool color is brown with no overt blood.   Musculoskeletal: Normal range of motion.  Neurological: She is alert and oriented to person, place, and time.  Skin: Skin is warm and dry. Capillary refill takes less than 2 seconds.  Psychiatric: She has a normal mood and affect. Her speech is normal.  Nursing note and vitals reviewed.    ED Treatments / Results  Labs (all labs ordered are listed, but only abnormal results are displayed) Labs Reviewed  CBC - Abnormal; Notable for the following components:      Result Value   Platelets 403 (*)    All other components within normal limits  URINALYSIS, ROUTINE W REFLEX MICROSCOPIC - Abnormal; Notable for the following components:   Hgb urine dipstick SMALL (*)    Ketones, ur 5 (*)    Bacteria, UA RARE (*)    Squamous Epithelial / LPF 0-5 (*)    All other components within normal limits  LIPASE, BLOOD  COMPREHENSIVE METABOLIC PANEL  POC OCCULT BLOOD, ED    EKG  EKG Interpretation None       Radiology Ct Abdomen Pelvis W Contrast  Result Date: 10/16/2017 CLINICAL DATA:  Lower mid abdominal pain starting yesterday. Rectal pain beginning today. Status post hysterectomy. Status post laparotomy with lysis of adhesions. EXAM: CT ABDOMEN AND PELVIS WITH CONTRAST TECHNIQUE:  Multidetector CT imaging of the abdomen and pelvis was performed using the standard protocol following bolus administration of intravenous contrast. CONTRAST:  ISOVUE-300 IOPAMIDOL (ISOVUE-300) INJECTION 61% COMPARISON:  CT abdomen dated 10/23/2016. FINDINGS: Lower chest: No acute abnormality. Hepatobiliary: No focal liver abnormality is seen. No gallstones, gallbladder wall thickening, or biliary dilatation. Pancreas: Unremarkable. No pancreatic ductal dilatation or surrounding inflammatory changes. Spleen: Normal in size without focal abnormality. Adrenals/Urinary Tract: Left renal cyst. No suspicious mass, stone or hydronephrosis bilaterally. Adrenal glands appear normal. No ureteral or bladder calculi identified. Bladder appears normal. Stomach/Bowel: Bowel is normal in caliber. No bowel wall thickening or evidence of bowel wall inflammation seen. Moderate amount of stool and gas throughout the colon. Appendix appears normal. Stomach is unremarkable. Vascular/Lymphatic: No significant vascular findings are present. No enlarged abdominal or pelvic lymph nodes. Reproductive: Status post hysterectomy. No adnexal masses. Other: No free fluid or abscess.  No free intraperitoneal air. Musculoskeletal: Mild degenerative change within the lower lumbar spine. No acute or suspicious osseous finding. IMPRESSION: 1. No acute findings within the abdomen or pelvis. No bowel obstruction or evidence of bowel wall inflammation. No evidence of acute solid organ abnormality. No free fluid. No renal or ureteral calculi. Appendix is normal. 2. Moderate amount of stool and gas throughout the nondistended colon. Electronically Signed   By: Bary Richard M.D.   On: 10/16/2017 22:35    Procedures Procedures (including critical care time)  Medications Ordered in ED Medications  iopamidol (ISOVUE-300) 61 % injection 100 mL (100 mLs Intravenous Contrast Given 10/16/17 2203)  dicyclomine (BENTYL) capsule 10 mg (10 mg Oral  Given 10/16/17 2353)  gi cocktail (Maalox,Lidocaine,Donnatal) (30 mLs Oral Given 10/16/17 2353)     Initial Impression / Assessment and Plan / ED Course  I have reviewed the triage vital signs and the nursing notes.  Pertinent labs & imaging results that were available during my care of the patient were reviewed by me and considered in my medical decision making (see chart for details).     57 year old female who presents for evaluation of lower abdominal pain prompting ED visit.  Has had some nausea but no vomiting.  Has had decreased p.o.  Last bowel movement was yesterday.  Does have a history of some small bowel obstructions, states this feels similar. Patient is afebrile, non-toxic appearing, sitting comfortably on examination table.  Vital signs reviewed.  Patient is slightly hypertensive.  Will reassess.  On exam, patient does have some mid/lower abdominal tenderness.  Abdomen is soft, not rectal exam reveals  no evidence of fissures, hemorrhoids.  No evidence of mass, fluctuance, tenderness on digital rectal exam.  Fecal occult was negative.  Consider infectious etiology versus small bowel obstruction. History/physical exam are not concerning for perirectal abscess.  History/physical exam are not concerning for kidney stone or appendicitis, diverticulitis, ovarian torsion, ovarian cyst. Do no suspect pelvic pathology given history/physical exam. Initial labs ordered at triage. Given tenderness and history of SBO, will plan for CT abd/pelvis for further evaluation.   Labs and imaging reviewed.  Lipase unremarkable.  CBC without any on abnormalities.  CMP without any abnormalities.  UA shows small amount of hemoglobin, otherwise no infectious etiology.  CT abdomen pelvis shows no no acute abnormality. No evidence of bowel obstructed.  It does show the patient is slightly constipated.  Discussed results with patient.  Tenderness has improved slightly.  We will plan to p.o. challenge patient in the  department.  Patient was able to tolerate p.o. in the department without any difficulty.  No vomiting.  Repeat abdominal exam she is still slightly tender to the mid andsuprapubic region but otherwise no peritoneal signs, no rigidity, guarding.  Patient still slightly hypertensive but vital signs are otherwise stable.  She is tolerating p.o. in the department.  Patient stable for discharge at this time.  Encouraged her to follow-up with her PCP in the next 24 hours for further evaluation. Patient had ample opportunity for questions and discussion. All patient's questions were answered with full understanding.  Final Clinical Impressions(s) / ED Diagnoses   Final diagnoses:  Lower abdominal pain    ED Discharge Orders    None       Rosana Hoes 10/17/17 1101    Lorre Nick, MD 10/18/17 [phone]

## 2017-10-16 NOTE — ED Triage Notes (Signed)
Pt comes in with lower mid abdominal pain that started yesterday.  Pt has had partial hysterectomy. Denies any vaginal discharge/bleeding. Also complains of rectal pain that began today. No discharge/bleeding from rectum with no hx of hemorrhoids.   Denies nausea or vomiting. Endorses belching.

## 2017-10-16 NOTE — ED Notes (Signed)
Pt given water for PO challenge 

## 2017-11-05 ENCOUNTER — Other Ambulatory Visit: Payer: Self-pay | Admitting: Family Medicine

## 2017-11-05 DIAGNOSIS — Z8719 Personal history of other diseases of the digestive system: Secondary | ICD-10-CM

## 2017-11-05 DIAGNOSIS — R1084 Generalized abdominal pain: Secondary | ICD-10-CM

## 2017-11-06 ENCOUNTER — Inpatient Hospital Stay (HOSPITAL_COMMUNITY)
Admission: EM | Admit: 2017-11-06 | Discharge: 2017-11-14 | DRG: 390 | Disposition: A | Discharge status: Home / Self Care | Payer: Managed Care, Other (non HMO) | Attending: Internal Medicine | Admitting: Internal Medicine

## 2017-11-06 ENCOUNTER — Emergency Department (HOSPITAL_COMMUNITY): Payer: Managed Care, Other (non HMO)

## 2017-11-06 ENCOUNTER — Other Ambulatory Visit: Payer: Self-pay

## 2017-11-06 ENCOUNTER — Encounter (HOSPITAL_COMMUNITY): Payer: Self-pay | Admitting: Emergency Medicine

## 2017-11-06 DIAGNOSIS — Z79890 Hormone replacement therapy (postmenopausal): Secondary | ICD-10-CM | POA: Diagnosis not present

## 2017-11-06 DIAGNOSIS — Z833 Family history of diabetes mellitus: Secondary | ICD-10-CM

## 2017-11-06 DIAGNOSIS — Z818 Family history of other mental and behavioral disorders: Secondary | ICD-10-CM

## 2017-11-06 DIAGNOSIS — Z8349 Family history of other endocrine, nutritional and metabolic diseases: Secondary | ICD-10-CM | POA: Diagnosis not present

## 2017-11-06 DIAGNOSIS — Z8249 Family history of ischemic heart disease and other diseases of the circulatory system: Secondary | ICD-10-CM | POA: Diagnosis not present

## 2017-11-06 DIAGNOSIS — E039 Hypothyroidism, unspecified: Secondary | ICD-10-CM | POA: Diagnosis present

## 2017-11-06 DIAGNOSIS — Z90710 Acquired absence of both cervix and uterus: Secondary | ICD-10-CM

## 2017-11-06 DIAGNOSIS — Z0189 Encounter for other specified special examinations: Secondary | ICD-10-CM

## 2017-11-06 DIAGNOSIS — R109 Unspecified abdominal pain: Secondary | ICD-10-CM

## 2017-11-06 DIAGNOSIS — K56600 Partial intestinal obstruction, unspecified as to cause: Principal | ICD-10-CM | POA: Diagnosis present

## 2017-11-06 DIAGNOSIS — K56609 Unspecified intestinal obstruction, unspecified as to partial versus complete obstruction: Secondary | ICD-10-CM | POA: Diagnosis not present

## 2017-11-06 DIAGNOSIS — E876 Hypokalemia: Secondary | ICD-10-CM | POA: Diagnosis not present

## 2017-11-06 DIAGNOSIS — I10 Essential (primary) hypertension: Secondary | ICD-10-CM | POA: Diagnosis present

## 2017-11-06 DIAGNOSIS — K219 Gastro-esophageal reflux disease without esophagitis: Secondary | ICD-10-CM | POA: Diagnosis present

## 2017-11-06 LAB — URINALYSIS, ROUTINE W REFLEX MICROSCOPIC
BACTERIA UA: NONE SEEN
Bilirubin Urine: NEGATIVE
Glucose, UA: NEGATIVE mg/dL
Ketones, ur: 20 mg/dL — AB
Leukocytes, UA: NEGATIVE
Nitrite: NEGATIVE
PROTEIN: NEGATIVE mg/dL
pH: 5 (ref 5.0–8.0)

## 2017-11-06 LAB — COMPREHENSIVE METABOLIC PANEL
ALK PHOS: 71 U/L (ref 38–126)
ALT: 16 U/L (ref 14–54)
ANION GAP: 11 (ref 5–15)
AST: 22 U/L (ref 15–41)
Albumin: 4.2 g/dL (ref 3.5–5.0)
BUN: 12 mg/dL (ref 6–20)
CALCIUM: 9.6 mg/dL (ref 8.9–10.3)
CO2: 28 mmol/L (ref 22–32)
Chloride: 99 mmol/L — ABNORMAL LOW (ref 101–111)
Creatinine, Ser: 0.94 mg/dL (ref 0.44–1.00)
GFR calc Af Amer: >60 mL/min (ref >60)
GFR calc non Af Amer: >60 mL/min (ref >60)
GLUCOSE: 111 mg/dL — AB (ref 65–99)
POTASSIUM: 3.5 mmol/L (ref 3.5–5.1)
SODIUM: 138 mmol/L (ref 135–145)
Total Bilirubin: 0.9 mg/dL (ref 0.3–1.2)
Total Protein: 7.6 g/dL (ref 6.5–8.1)

## 2017-11-06 LAB — CBC
HEMATOCRIT: 41.8 % (ref 36.0–46.0)
HEMOGLOBIN: 13.6 g/dL (ref 12.0–15.0)
MCH: 27.8 pg (ref 26.0–34.0)
MCHC: 32.5 g/dL (ref 30.0–36.0)
MCV: 85.3 fL (ref 78.0–100.0)
Platelets: 437 10*3/uL — ABNORMAL HIGH (ref 150–400)
RBC: 4.9 MIL/uL (ref 3.87–5.11)
RDW: 13.2 % (ref 11.5–15.5)
WBC: 6.8 10*3/uL (ref 4.0–10.5)

## 2017-11-06 LAB — LIPASE, BLOOD: Lipase: 24 U/L (ref 11–51)

## 2017-11-06 MED ORDER — SODIUM CHLORIDE 0.9 % IV SOLN
INTRAVENOUS | Status: DC
  Administered 2017-11-07 (×2): via INTRAVENOUS

## 2017-11-06 MED ORDER — PANTOPRAZOLE SODIUM 40 MG IV SOLR
40.0000 mg | INTRAVENOUS | Status: DC
  Administered 2017-11-07 – 2017-11-11 (×5): 40 mg via INTRAVENOUS
  Filled 2017-11-06 (×5): qty 40

## 2017-11-06 MED ORDER — LEVOTHYROXINE SODIUM 100 MCG IV SOLR
37.5000 ug | Freq: Every day | INTRAVENOUS | Status: DC
  Administered 2017-11-07 – 2017-11-11 (×5): 37.5 ug via INTRAVENOUS
  Filled 2017-11-06 (×5): qty 5

## 2017-11-06 MED ORDER — DICYCLOMINE HCL 20 MG PO TABS: 20.0000 mg | ORAL_TABLET | Freq: Four times a day (QID) | ORAL | Status: DC

## 2017-11-06 MED ORDER — LEVOTHYROXINE SODIUM 75 MCG PO TABS: 75.0000 ug | ORAL_TABLET | Freq: Every day | ORAL | Status: DC

## 2017-11-06 MED ORDER — PANTOPRAZOLE SODIUM 40 MG PO TBEC: 40.0000 mg | DELAYED_RELEASE_TABLET | Freq: Every day | ORAL | Status: DC

## 2017-11-06 MED ORDER — ONDANSETRON HCL 4 MG/2ML IJ SOLN
4.0000 mg | Freq: Four times a day (QID) | INTRAMUSCULAR | Status: DC | PRN
  Administered 2017-11-07 – 2017-11-09 (×4): 4 mg via INTRAVENOUS
  Filled 2017-11-06 (×5): qty 2

## 2017-11-06 MED ORDER — ONDANSETRON HCL 4 MG PO TABS
4.0000 mg | ORAL_TABLET | Freq: Four times a day (QID) | ORAL | Status: DC | PRN
  Administered 2017-11-13: 4 mg via ORAL
  Filled 2017-11-06: qty 1

## 2017-11-06 MED ORDER — CALCIUM CARBONATE-VITAMIN D 600-400 MG-UNIT PO TABS: 1.0000 | ORAL_TABLET | Freq: Every day | ORAL | Status: DC

## 2017-11-06 MED ORDER — MORPHINE SULFATE (PF) 2 MG/ML IV SOLN
2.0000 mg | INTRAVENOUS | Status: DC | PRN
  Administered 2017-11-07 – 2017-11-08 (×4): 2 mg via INTRAVENOUS
  Filled 2017-11-06 (×5): qty 1

## 2017-11-06 MED ORDER — SODIUM CHLORIDE 0.9 % IV BOLUS (SEPSIS)
1000.0000 mL | Freq: Once | INTRAVENOUS | Status: AC
  Administered 2017-11-06: 1000 mL via INTRAVENOUS

## 2017-11-06 MED ORDER — LIDOCAINE HCL 2 % EX GEL
1.0000 "application " | Freq: Once | CUTANEOUS | Status: AC
  Administered 2017-11-06: 1
  Filled 2017-11-06: qty 5

## 2017-11-06 MED ORDER — ACETAMINOPHEN 325 MG PO TABS
650.0000 mg | ORAL_TABLET | Freq: Four times a day (QID) | ORAL | Status: DC | PRN
  Administered 2017-11-13: 650 mg via ORAL
  Filled 2017-11-06: qty 2

## 2017-11-06 MED ORDER — ENOXAPARIN SODIUM 40 MG/0.4ML ~~LOC~~ SOLN
40.0000 mg | SUBCUTANEOUS | Status: DC
  Administered 2017-11-07 – 2017-11-14 (×8): 40 mg via SUBCUTANEOUS
  Filled 2017-11-06 (×9): qty 0.4

## 2017-11-06 MED ORDER — ONDANSETRON 8 MG PO TBDP: 8.0000 mg | ORAL_TABLET | Freq: Two times a day (BID) | ORAL | Status: DC | PRN

## 2017-11-06 MED ORDER — LATANOPROST 0.005 % OP SOLN
1.0000 [drp] | Freq: Every day | OPHTHALMIC | Status: DC
  Administered 2017-11-07 – 2017-11-13 (×8): 1 [drp] via OPHTHALMIC
  Filled 2017-11-06: qty 2.5

## 2017-11-06 MED ORDER — ACETAMINOPHEN 650 MG RE SUPP: 650.0000 mg | Freq: Four times a day (QID) | RECTAL | Status: DC | PRN

## 2017-11-06 NOTE — H&P (Signed)
History and Physical    Debbie Brandt:[id number] DOB: 1960-11-21 DOA: 11/06/2017  PCP: Darrow Bussing, MD  Patient coming from: Home  I have personally briefly reviewed patient's old medical records in [facility]  Chief Complaint: Abd pain  HPI: Debbie Brandt is a 57 y.o. female with medical history significant of SBO, lysis of adhesions in 10/28/2016.  Patient presents to ED with c/o 3 weeks of intermittent upper abd pain.  One episode of vomiting in that time period which occurred today after arrival to ED.  Had normal BM yesterday and is passing gas.  Symptoms progressively worsening over past 3 weeks or so.  Went to outside facility, had CT scan done which by report shows possible internal hernia with SBO.   ED Course: NGT placed.   Review of Systems: As per HPI otherwise 10 point review of systems negative.   Past Medical History:  Diagnosis Date  . Chest pain 09/26/2015  . Essential hypertension 09/26/2015  . GERD (gastroesophageal reflux disease)   . Hypertension   . Hypothyroidism 09/26/2015  . Murmur 09/26/2015  . Thyroid disease     Past Surgical History:  Procedure Laterality Date  . ABDOMINAL HYSTERECTOMY    . CESAREAN SECTION    . LAPAROTOMY N/A 10/28/2016   Procedure: EXPLORATORY LAPAROTOMY WITH  LYSIS OF ADHESIONS;  Surgeon: Avel Peace, MD;  Location: [facility];  Service: General;  Laterality: N/A;     reports that  has never smoked. she has never used smokeless tobacco. She reports that she does not drink alcohol or use drugs.  No Known Allergies  Family History  Problem Relation Age of Onset  . Cancer Mother        lung cancer  . Hypertension Mother   . Cancer Father   . Heart attack Father   . Mental illness Sister   . Hypertension Sister   . Hyperlipidemia Sister   . Diabetes Brother   . Hypertension Brother      Prior to Admission medications   Medication Sig Start Date End Date Taking? Authorizing Provider  acetaminophen 325  MG tablet Plain Tylenol, acetaminophen:  You can take 2 tablets every 4 hours as needed.  Do not exceed 4000 mg per day. 11/04/16  Yes Sherrie George, PA-C  Calcium Carbonate-Vitamin D 600-400 MG-UNIT per tablet Take 1 tablet by mouth daily.   Yes [provider]  dicyclomine (BENTYL) 20 MG tablet Take 1 tablet by mouth 4 (four) times daily. 11/05/17  Yes [provider]  ibuprofen (ADVIL,MOTRIN) 200 MG tablet You can take 2-3 tablets every 6 hours as needed for pain control. I would take this first. You can alternate this with plain Tylenol as directed above. The prescription pain medication oxycodone I would take as a last resort if Tylenol and ibuprofen are ineffective. 11/04/16  Yes Sherrie George, PA-C  levothyroxine (SYNTHROID, LEVOTHROID) 75 MCG tablet Take 75 mcg by mouth daily before breakfast.   Yes [provider]  MULTIPLE VITAMIN PO Take 1 tablet by mouth daily.   Yes [provider]  omeprazole (PRILOSEC) 20 MG capsule Take 1 capsule (20 mg total) by mouth daily. 03/09/16  Yes Palumbo, April, MD  ondansetron (ZOFRAN-ODT) 8 MG disintegrating tablet Take 1 tablet by mouth 2 (two) times daily as needed for nausea.  11/05/17  Yes [provider]  oxyCODONE (OXY IR/ROXICODONE) 5 MG immediate release tablet Take 1-2 tablets (5-10 mg total) by mouth every 6 (six) hours as  needed for severe pain or breakthrough pain (pain not relieved by Tyelnol or ibuprofen). 11/04/16  Yes Sherrie George, PA-C  TRAVATAN Z 0.004 % SOLN ophthalmic solution Place 1 drop into the right eye daily at 6 PM.  12/10/15  Yes [provider]  triamterene-hydrochlorothiazide (MAXZIDE-25) 37.5-25 MG tablet See you primary care physician. Have them recheck your blood pressure and decide when to resume this medication. 11/04/16  Yes Sherrie George, PA-C    Physical Exam: Vitals:   11/06/17 1706 11/06/17 1927  BP: (!) 153/79 (!) 157/72  Pulse: 68 62  Resp: 16 20    Temp: 97.9 F (36.6 C)   TempSrc: Oral   SpO2: 100% 100%    Constitutional: NAD, calm, comfortable Eyes: PERRL, lids and conjunctivae normal ENMT: Mucous membranes are moist. Posterior pharynx clear of any exudate or lesions.Normal dentition.  Neck: normal, supple, no masses, no thyromegaly Respiratory: clear to auscultation bilaterally, no wheezing, no crackles. Normal respiratory effort. No accessory muscle use.  Cardiovascular: Regular rate and rhythm, no murmurs / rubs / gallops. No extremity edema. 2+ pedal pulses. No carotid bruits.  Abdomen: no tenderness, no masses palpated. No hepatosplenomegaly. Bowel sounds positive.  Musculoskeletal: no clubbing / cyanosis. No joint deformity upper and lower extremities. Good ROM, no contractures. Normal muscle tone.  Skin: no rashes, lesions, ulcers. No induration Neurologic: CN 2-12 grossly intact. Sensation intact, DTR normal. Strength 5/5 in all 4.  Psychiatric: Normal judgment and insight. Alert and oriented x 3. Normal mood.    Labs on Admission: I have personally reviewed following labs and imaging studies  CBC: Recent Labs  Lab 11/06/17 1708  WBC 6.8  HGB 13.6  HCT 41.8  MCV 85.3  PLT 437*   Basic Metabolic Panel: Recent Labs  Lab 11/06/17 1708  NA 138  K 3.5  CL 99*  CO2 28  GLUCOSE 111*  BUN 12  CREATININE 0.94  CALCIUM 9.6   GFR: CrCl cannot be calculated (Unknown ideal weight.). Liver Function Tests: Recent Labs  Lab 11/06/17 1708  AST 22  ALT 16  ALKPHOS 71  BILITOT 0.9  PROT 7.6  ALBUMIN 4.2   Recent Labs  Lab 11/06/17 1708  LIPASE 24   No results for input(s): AMMONIA in the last 168 hours. Coagulation Profile: No results for input(s): INR, PROTIME in the last 168 hours. Cardiac Enzymes: No results for input(s): CKTOTAL, CKMB, CKMBINDEX, TROPONINI in the last 168 hours. BNP (last 3 results) No results for input(s): PROBNP in the last 8760 hours. HbA1C: No results for input(s): HGBA1C  in the last 72 hours. CBG: No results for input(s): GLUCAP in the last 168 hours. Lipid Profile: No results for input(s): CHOL, HDL, LDLCALC, TRIG, CHOLHDL, LDLDIRECT in the last 72 hours. Thyroid Function Tests: No results for input(s): TSH, T4TOTAL, FREET4, T3FREE, THYROIDAB in the last 72 hours. Anemia Panel: No results for input(s): VITAMINB12, FOLATE, FERRITIN, TIBC, IRON, RETICCTPCT in the last 72 hours. Urine analysis:    Component Value Date/Time   COLORURINE YELLOW 11/06/2017 2105   APPEARANCEUR CLEAR 11/06/2017 2105   LABSPEC >1.046 (H) 11/06/2017 2105   PHURINE 5.0 11/06/2017 2105   GLUCOSEU NEGATIVE 11/06/2017 2105   HGBUR SMALL (A) 11/06/2017 2105   BILIRUBINUR NEGATIVE 11/06/2017 2105   KETONESUR 20 (A) 11/06/2017 2105   PROTEINUR NEGATIVE 11/06/2017 2105   UROBILINOGEN 0.2 04/15/2008 1541   NITRITE NEGATIVE 11/06/2017 2105   LEUKOCYTESUR NEGATIVE 11/06/2017 2105    Radiological Exams on Admission: Dg Abdomen 1  View  Result Date: 11/06/2017 CLINICAL DATA:  Evaluate nasogastric tube placement. EXAM: ABDOMEN - 1 VIEW COMPARISON:  Abdominal radiograph October 28, 2016 and CT abdomen and pelvis October 16, 2017 FINDINGS: Nasogastric tube courses in distribution stomach with distal tip projecting in mid to distal duodenum. Included abdomen demonstrates mildly distended small bowel. Stool and air in the included large bowel. IMPRESSION: Nasogastric tube tip projecting in mid to distal duodenum. Included abdomen demonstrates mildly prominent small bowel, possible early small bowel obstruction. Electronically Signed   By: Awilda Metroourtnay  Bloomer M.D.   On: 11/06/2017 23:04    EKG: Independently reviewed.  Assessment/Plan Principal Problem:   SBO (small bowel obstruction) (HCC) Active Problems:   Hypothyroidism    1. SBO - possible internal hernia 1. NGT 2. IVF 3. NPO 4. Dr. Carolynne Edouardoth note in chart 5. Per his note, plan is to start small bowel protocol tomorrow  AM. 2. Hypothyroidism - convert synthroid to IV  DVT prophylaxis: Lovenox Code Status: Full Family Communication: No family in room Disposition Plan: Home after admit Consults called: Gen surg Admission status: Admit to inpatient   Hillary BowGARDNER, Reshanda Lewey M. DO [facility] Pager [phone]  If 7AM-7PM, please contact day team taking care of patient [email or web address] Password [email or web address]  11/06/2017, 11:08 PM

## 2017-11-06 NOTE — ED Triage Notes (Signed)
Pt sent for SBO found though scan. Complaint of lower abdominal pain with nausea for weeks.

## 2017-11-06 NOTE — Consult Note (Signed)
Reason for Consult: Abdominal pain Referring Physician: Dr. [name] is an 57 y.o. female.  HPI: The patient is a 57 year old black female who presents with 3 weeks of intermittent upper abdominal pain.  This is been associated with belching.  She has had only one episode of vomiting which occurred after she arrived to the emergency department.  She has been passing flatus and had a normal bowel movement yesterday.  The symptoms have been progressive over the last 3 weeks or so.  She had a CT scan done at an outside facility today which by report shows a possible internal hernia with bowel obstruction.  She has no fever.  Her white count is normal.  She has a history of exploration for bowel obstruction 1 year ago.  Past Medical History:  Diagnosis Date  . Chest pain 09/26/2015  . Essential hypertension 09/26/2015  . GERD (gastroesophageal reflux disease)   . Hypertension   . Hypothyroidism 09/26/2015  . Murmur 09/26/2015  . Thyroid disease     Past Surgical History:  Procedure Laterality Date  . ABDOMINAL HYSTERECTOMY    . CESAREAN SECTION    . LAPAROTOMY N/A 10/28/2016   Procedure: EXPLORATORY LAPAROTOMY WITH  LYSIS OF ADHESIONS;  Surgeon: Jackolyn Confer, MD;  Location: [facility];  Service: General;  Laterality: N/A;    Family History  Problem Relation Age of Onset  . Cancer Mother        lung cancer  . Hypertension Mother   . Cancer Father   . Heart attack Father   . Mental illness Sister   . Hypertension Sister   . Hyperlipidemia Sister   . Diabetes Brother   . Hypertension Brother     Social History:  reports that  has never smoked. she has never used smokeless tobacco. She reports that she does not drink alcohol or use drugs.  Allergies: No Known Allergies  Medications: I have reviewed the patient's current medications.  Results for orders placed or performed during the hospital encounter of 11/06/17 (from the past 48 hour(s))  Lipase, blood     Status:  None   Collection Time: 11/06/17  5:08 PM  Result Value Ref Range   Lipase 24 11 - 51 U/L    Comment: Performed at [facility], [place] [address] [place], [place] [address]  Comprehensive metabolic panel     Status: Abnormal   Collection Time: 11/06/17  5:08 PM  Result Value Ref Range   Sodium 138 135 - 145 mmol/L   Potassium 3.5 3.5 - 5.1 mmol/L   Chloride 99 (L) 101 - 111 mmol/L   CO2 28 22 - 32 mmol/L   Glucose, Bld 111 (H) 65 - 99 mg/dL   BUN 12 6 - 20 mg/dL   Creatinine, Ser 0.94 0.44 - 1.00 mg/dL   Calcium 9.6 8.9 - 10.3 mg/dL   Total Protein 7.6 6.5 - 8.1 g/dL   Albumin 4.2 3.5 - 5.0 g/dL   AST 22 15 - 41 U/L   ALT 16 14 - 54 U/L   Alkaline Phosphatase 71 38 - 126 U/L   Total Bilirubin 0.9 0.3 - 1.2 mg/dL   GFR calc non Af Amer >60 >60 mL/min   GFR calc Af Amer >60 >60 mL/min    Comment: (NOTE) The eGFR has been calculated using the CKD EPI equation. This calculation has not been validated in all clinical situations. eGFR's persistently <60 mL/min signify possible Chronic Kidney Disease.  Anion gap 11 5 - 15    Comment: Performed at [facility], [place] [address], [place], [place] [address]  CBC     Status: Abnormal   Collection Time: 11/06/17  5:08 PM  Result Value Ref Range   WBC 6.8 4.0 - 10.5 K/uL   RBC 4.90 3.87 - 5.11 MIL/uL   Hemoglobin 13.6 12.0 - 15.0 g/dL   HCT 41.8 36.0 - 46.0 %   MCV 85.3 78.0 - 100.0 fL   MCH 27.8 26.0 - 34.0 pg   MCHC 32.5 30.0 - 36.0 g/dL   RDW 13.2 11.5 - 15.5 %   Platelets 437 (H) 150 - 400 K/uL    Comment: Performed at [facility], [place] [address], [place], [place] [address]  Urinalysis, Routine w reflex microscopic     Status: Abnormal   Collection Time: 11/06/17  9:05 PM  Result Value Ref Range   Color, Urine YELLOW YELLOW   APPearance CLEAR CLEAR   Specific Gravity, Urine >1.046 (H) 1.005 - 1.030   pH 5.0 5.0 - 8.0   Glucose, UA NEGATIVE NEGATIVE mg/dL   Hgb urine  dipstick SMALL (A) NEGATIVE   Bilirubin Urine NEGATIVE NEGATIVE   Ketones, ur 20 (A) NEGATIVE mg/dL   Protein, ur NEGATIVE NEGATIVE mg/dL   Nitrite NEGATIVE NEGATIVE   Leukocytes, UA NEGATIVE NEGATIVE   RBC / HPF 0-5 0 - 5 RBC/hpf   WBC, UA 0-5 0 - 5 WBC/hpf   Bacteria, UA NONE SEEN NONE SEEN   Squamous Epithelial / LPF 0-5 (A) NONE SEEN   Mucus PRESENT     Comment: Performed at [facility], [place] [address] [place], [place] [address]    No results found.  Review of Systems  Constitutional: Negative.   HENT: Negative.   Eyes: Negative.   Respiratory: Negative.   Cardiovascular: Negative.   Gastrointestinal: Positive for abdominal pain, nausea and vomiting.  Genitourinary: Negative.   Musculoskeletal: Negative.   Skin: Negative.   Neurological: Negative.   Endo/Heme/Allergies: Negative.   Psychiatric/Behavioral: Negative.    Blood pressure (!) 157/72, pulse 62, temperature 97.9 F (36.6 C), temperature source Oral, resp. rate 20, SpO2 100 %. Physical Exam  Constitutional: She is oriented to person, place, and time. She appears well-developed and well-nourished. No distress.  HENT:  Head: Normocephalic and atraumatic.  Mouth/Throat: No oropharyngeal exudate.  Eyes: Conjunctivae and EOM are normal. Pupils are equal, round, and reactive to light.  Neck: Normal range of motion. Neck supple.  Cardiovascular: Normal rate, regular rhythm and normal heart sounds.  Respiratory: Effort normal and breath sounds normal. No stridor. No respiratory distress.  GI: Soft.  The abdomen is soft with only mild epigastric tenderness but no guarding or peritonitis.  There is minimal distention.  Musculoskeletal: Normal range of motion. She exhibits no edema or tenderness.  Neurological: She is alert and oriented to person, place, and time. Coordination normal.  Skin: Skin is warm and dry. No erythema.  Psychiatric: She has a normal mood and affect. Her behavior is normal.  Thought content normal.    Assessment/Plan: The patient appears to have a small bowel obstruction.  Since her abdomen is quite soft and her white count is normal and temperature is normal I would favor decompression with an NG tube.  We can start the small bowel protocol in the morning.  We will follow her closely with you.  If she does not resolve over the next couple days then  she may require exploration again.  TOTH III,Jasiyah Paulding S 11/06/2017, 10:48 PM

## 2017-11-06 NOTE — ED Provider Notes (Signed)
[facility]-EMERGENCY DEPT Provider Note  CSN: [id number] Arrival date & time: 11/06/17 1628  Chief Complaint(s) Abdominal Pain  HPI Debbie Brandt is a [age] y.o. female h/o SBO  The history is provided by the patient.  Abdominal Pain   This is a recurrent problem. Episode onset: 1 week. The problem occurs constantly. The problem has been gradually worsening. The pain is associated with an unknown factor. The pain is located in the epigastric region and periumbilical region. The quality of the pain is aching and cramping. The pain is moderate. Associated symptoms include vomiting. Pertinent negatives include fever, diarrhea, flatus, melena and nausea. The symptoms are aggravated by eating. Nothing relieves the symptoms.   Seen at PCP's clinic yesterday who ordered CT that showed SBO with possible internal hernia.   Past Medical History Past Medical History:  Diagnosis Date  . Chest pain 09/26/2015  . Essential hypertension 09/26/2015  . GERD (gastroesophageal reflux disease)   . Hypertension   . Hypothyroidism 09/26/2015  . Murmur 09/26/2015  . Thyroid disease    Patient Active Problem List   Diagnosis Date Noted  . Small bowel obstruction due to adhesions (HCC) 10/28/2016  . Small bowel obstruction s/p ex lap & lysis of adhesions 10/28/2016 10/23/2016  . Abdominal mass 10/23/2016  . Hypokalemia 10/23/2016  . Murmur 09/26/2015  . Chest pain 09/26/2015  . HTN (hypertension) 09/26/2015  . Hypothyroidism 09/26/2015   Home Medication(s) Prior to Admission medications   Medication Sig Start Date End Date Taking? Authorizing Provider  acetaminophen 325 MG tablet Plain Tylenol, acetaminophen:  You can take 2 tablets every 4 hours as needed.  Do not exceed 4000 mg per day. 11/04/16   Sherrie GeorgeJennings, Willard, PA-C  Calcium Carbonate-Vitamin D 600-400 MG-UNIT per tablet Take 1 tablet by mouth daily.    [provider]  ibuprofen (ADVIL,MOTRIN) 200 MG tablet You can  take 2-3 tablets every 6 hours as needed for pain control. I would take this first. You can alternate this with plain Tylenol as directed above. The prescription pain medication oxycodone I would take as a last resort if Tylenol and ibuprofen are ineffective. 11/04/16   Sherrie GeorgeJennings, Willard, PA-C  levothyroxine (SYNTHROID, LEVOTHROID) 75 MCG tablet Take 75 mcg by mouth daily before breakfast.    [provider]  MULTIPLE VITAMIN PO Take 1 tablet by mouth daily.    [provider]  omeprazole (PRILOSEC) 20 MG capsule Take 1 capsule (20 mg total) by mouth daily. 03/09/16   Palumbo, April, MD  oxyCODONE (OXY IR/ROXICODONE) 5 MG immediate release tablet Take 1-2 tablets (5-10 mg total) by mouth every 6 (six) hours as needed for severe pain or breakthrough pain (pain not relieved by Tyelnol or ibuprofen). 11/04/16   Sherrie GeorgeJennings, Willard, PA-C  TRAVATAN Z 0.004 % SOLN ophthalmic solution Place 1 drop into the right eye daily at 6 PM.  12/10/15   [provider]  triamterene-hydrochlorothiazide (MAXZIDE-25) 37.5-25 MG tablet See you primary care physician. Have them recheck your blood pressure and decide when to resume this medication. 11/04/16   Sherrie GeorgeJennings, Willard, PA-C  Past Surgical History Past Surgical History:  Procedure Laterality Date  . ABDOMINAL HYSTERECTOMY    . CESAREAN SECTION    . LAPAROTOMY N/A 10/28/2016   Procedure: EXPLORATORY LAPAROTOMY WITH  LYSIS OF ADHESIONS;  Surgeon: Avel Peace, MD;  Location: [facility];  Service: General;  Laterality: N/A;   Family History Family History  Problem Relation Age of Onset  . Cancer Mother        lung cancer  . Hypertension Mother   . Cancer Father   . Heart attack Father   . Mental illness Sister   . Hypertension Sister   . Hyperlipidemia Sister   . Diabetes Brother   . Hypertension Brother      Social History Social History   Tobacco Use  . Smoking status: Never Smoker  . Smokeless tobacco: Never Used  Substance Use Topics  . Alcohol use: No    Alcohol/week: 0.0 oz  . Drug use: No   Allergies Patient has no known allergies.  Review of Systems Review of Systems  Constitutional: Negative for fever.  Gastrointestinal: Positive for abdominal pain and vomiting. Negative for diarrhea, flatus, melena and nausea.   All other systems are reviewed and are negative for acute change except as noted in the HPI  Physical Exam Vital Signs  I have reviewed the triage vital signs BP (!) 157/72 (BP Location: Left Arm)   Pulse 62   Temp 97.9 F (36.6 C) (Oral)   Resp 20   SpO2 100%   Physical Exam  Constitutional: She is oriented to person, place, and time. She appears well-developed and well-nourished. No distress.  HENT:  Head: Normocephalic and atraumatic.  Nose: Nose normal.  Eyes: Conjunctivae and EOM are normal. Pupils are equal, round, and reactive to light. Right eye exhibits no discharge. Left eye exhibits no discharge. No scleral icterus.  Neck: Normal range of motion. Neck supple.  Cardiovascular: Normal rate and regular rhythm. Exam reveals no gallop and no friction rub.  No murmur heard. Pulmonary/Chest: Effort normal and breath sounds normal. No stridor. No respiratory distress. She has no rales.  Abdominal: Soft. She exhibits distension. There is tenderness in the epigastric area. There is no rigidity, no rebound and no guarding.  Musculoskeletal: She exhibits no edema or tenderness.  Neurological: She is alert and oriented to person, place, and time.  Skin: Skin is warm and dry. No rash noted. She is not diaphoretic. No erythema.  Psychiatric: She has a normal mood and affect.  Vitals reviewed.   ED Results and Treatments Labs (all labs ordered are listed, but only abnormal results are displayed) Labs Reviewed  COMPREHENSIVE METABOLIC PANEL -  Abnormal; Notable for the following components:      Result Value   Chloride 99 (*)    Glucose, Bld 111 (*)    All other components within normal limits  CBC - Abnormal; Notable for the following components:   Platelets 437 (*)    All other components within normal limits  LIPASE, BLOOD  URINALYSIS, ROUTINE W REFLEX MICROSCOPIC  EKG  EKG Interpretation  Date/Time:    Ventricular Rate:    PR Interval:    QRS Duration:   QT Interval:    QTC Calculation:   R Axis:     Text Interpretation:        Radiology No results found. Pertinent labs & imaging results that were available during my care of the patient were reviewed by me and considered in my medical decision making (see chart for details).  Medications Ordered in ED Medications  sodium chloride 0.9 % bolus 1,000 mL (1,000 mLs Intravenous New Bag/Given 11/06/17 2122)                                                                                                                                    Procedures Procedures  (including critical care time)  Medical Decision Making / ED Course I have reviewed the nursing notes for this encounter and the patient's prior records (if available in EHR or on provided paperwork).    CT confirmed small bowel obstruction and possible internal hernia.  I discussed the case with Dr. Carolynne Edouard from general surgery who recommended SBO protocol and medicine admission.  Will discuss with medicine for admission  Final Clinical Impression(s) / ED Diagnoses Final diagnoses:  SBO (small bowel obstruction) (HCC)      This chart was dictated using voice recognition software.  Despite best efforts to proofread,  errors can occur which can change the documentation meaning.   Nira Conn, MD 11/06/17 2128

## 2017-11-07 ENCOUNTER — Other Ambulatory Visit: Payer: Self-pay

## 2017-11-07 ENCOUNTER — Inpatient Hospital Stay (HOSPITAL_COMMUNITY): Payer: Managed Care, Other (non HMO)

## 2017-11-07 DIAGNOSIS — Z0189 Encounter for other specified special examinations: Secondary | ICD-10-CM

## 2017-11-07 LAB — CBC
HEMATOCRIT: 39.9 % (ref 36.0–46.0)
HEMOGLOBIN: 13.1 g/dL (ref 12.0–15.0)
MCH: 28.1 pg (ref 26.0–34.0)
MCHC: 32.8 g/dL (ref 30.0–36.0)
MCV: 85.6 fL (ref 78.0–100.0)
Platelets: 428 10*3/uL — ABNORMAL HIGH (ref 150–400)
RBC: 4.66 MIL/uL (ref 3.87–5.11)
RDW: 13.3 % (ref 11.5–15.5)
WBC: 6.6 10*3/uL (ref 4.0–10.5)

## 2017-11-07 LAB — BASIC METABOLIC PANEL
ANION GAP: 11 (ref 5–15)
BUN: 12 mg/dL (ref 6–20)
CHLORIDE: 102 mmol/L (ref 101–111)
CO2: 26 mmol/L (ref 22–32)
Calcium: 8.8 mg/dL — ABNORMAL LOW (ref 8.9–10.3)
Creatinine, Ser: 0.81 mg/dL (ref 0.44–1.00)
GFR calc non Af Amer: >60 mL/min (ref >60)
Glucose, Bld: 109 mg/dL — ABNORMAL HIGH (ref 65–99)
POTASSIUM: 3.3 mmol/L — AB (ref 3.5–5.1)
Sodium: 139 mmol/L (ref 135–145)

## 2017-11-07 LAB — HIV ANTIBODY (ROUTINE TESTING W REFLEX): HIV Screen 4th Generation wRfx: NONREACTIVE

## 2017-11-07 MED ORDER — POTASSIUM CHLORIDE 10 MEQ/100ML IV SOLN
10.0000 meq | INTRAVENOUS | Status: AC
  Administered 2017-11-07 (×4): 10 meq via INTRAVENOUS
  Filled 2017-11-07 (×4): qty 100

## 2017-11-07 MED ORDER — DEXTROSE IN LACTATED RINGERS 5 % IV SOLN
INTRAVENOUS | Status: DC
  Administered 2017-11-07 – 2017-11-10 (×7): via INTRAVENOUS

## 2017-11-07 MED ORDER — HYDRALAZINE HCL 20 MG/ML IJ SOLN
10.0000 mg | Freq: Four times a day (QID) | INTRAMUSCULAR | Status: DC | PRN
  Administered 2017-11-08 – 2017-11-10 (×2): 10 mg via INTRAVENOUS
  Filled 2017-11-07 (×3): qty 1

## 2017-11-07 MED ORDER — LIP MEDEX EX OINT
TOPICAL_OINTMENT | CUTANEOUS | Status: AC
  Administered 2017-11-07: 15:00:00
  Filled 2017-11-07: qty 7

## 2017-11-07 MED ORDER — DIATRIZOATE MEGLUMINE & SODIUM 66-10 % PO SOLN
90.0000 mL | Freq: Once | ORAL | Status: AC
  Administered 2017-11-07: 10:00:00 90 mL via NASOGASTRIC
  Filled 2017-11-07: qty 90

## 2017-11-07 MED ORDER — PHENOL 1.4 % MT LIQD
1.0000 | OROMUCOSAL | Status: DC | PRN
  Administered 2017-11-07: 1 via OROMUCOSAL
  Filled 2017-11-07: qty 177

## 2017-11-07 NOTE — Progress Notes (Signed)
SBO (small bowel obstruction) (HCC)  Subjective: Pt feels about the same  Objective: Vital signs in last 24 hours: Temp:  [97.9 F (36.6 C)-98.2 F (36.8 C)] 98.1 F (36.7 C) (02/22 0635) Pulse Rate:  [60-68] 67 (02/22 0635) Resp:  [13-20] 13 (02/22 0635) BP: (153-179)/(66-79) 168/66 (02/22 0635) SpO2:  [99 %-100 %] 100 % (02/22 0635) Weight:  [75.2 kg (165 lb 12.6 oz)] 75.2 kg (165 lb 12.6 oz) (02/22 0054) Last BM Date: 11/06/17  Intake/Output from previous day: 02/21 0701 - 02/22 0700 In: 538.3 [I.V.:538.3] Out: 550 [Urine:300; Emesis/NG output:250] Intake/Output this shift: No intake/output data recorded.  General appearance: alert and cooperative GI: normal findings: soft, non-tender, mildly distended  Lab Results:  Results for orders placed or performed during the hospital encounter of 11/06/17 (from the past 24 hour(s))  Lipase, blood     Status: None   Collection Time: 11/06/17  5:08 PM  Result Value Ref Range   Lipase 24 11 - 51 U/L  Comprehensive metabolic panel     Status: Abnormal   Collection Time: 11/06/17  5:08 PM  Result Value Ref Range   Sodium 138 135 - 145 mmol/L   Potassium 3.5 3.5 - 5.1 mmol/L   Chloride 99 (L) 101 - 111 mmol/L   CO2 28 22 - 32 mmol/L   Glucose, Bld 111 (H) 65 - 99 mg/dL   BUN 12 6 - 20 mg/dL   Creatinine, Ser 1.61 0.44 - 1.00 mg/dL   Calcium 9.6 8.9 - 09.6 mg/dL   Total Protein 7.6 6.5 - 8.1 g/dL   Albumin 4.2 3.5 - 5.0 g/dL   AST 22 15 - 41 U/L   ALT 16 14 - 54 U/L   Alkaline Phosphatase 71 38 - 126 U/L   Total Bilirubin 0.9 0.3 - 1.2 mg/dL   GFR calc non Af Amer >60 >60 mL/min   GFR calc Af Amer >60 >60 mL/min   Anion gap 11 5 - 15  CBC     Status: Abnormal   Collection Time: 11/06/17  5:08 PM  Result Value Ref Range   WBC 6.8 4.0 - 10.5 K/uL   RBC 4.90 3.87 - 5.11 MIL/uL   Hemoglobin 13.6 12.0 - 15.0 g/dL   HCT 04.5 40.9 - 81.1 %   MCV 85.3 78.0 - 100.0 fL   MCH 27.8 26.0 - 34.0 pg   MCHC 32.5 30.0 - 36.0 g/dL   RDW 91.4 78.2 - 95.6 %   Platelets 437 (H) 150 - 400 K/uL  Urinalysis, Routine w reflex microscopic     Status: Abnormal   Collection Time: 11/06/17  9:05 PM  Result Value Ref Range   Color, Urine YELLOW YELLOW   APPearance CLEAR CLEAR   Specific Gravity, Urine >1.046 (H) 1.005 - 1.030   pH 5.0 5.0 - 8.0   Glucose, UA NEGATIVE NEGATIVE mg/dL   Hgb urine dipstick SMALL (A) NEGATIVE   Bilirubin Urine NEGATIVE NEGATIVE   Ketones, ur 20 (A) NEGATIVE mg/dL   Protein, ur NEGATIVE NEGATIVE mg/dL   Nitrite NEGATIVE NEGATIVE   Leukocytes, UA NEGATIVE NEGATIVE   RBC / HPF 0-5 0 - 5 RBC/hpf   WBC, UA 0-5 0 - 5 WBC/hpf   Bacteria, UA NONE SEEN NONE SEEN   Squamous Epithelial / LPF 0-5 (A) NONE SEEN   Mucus PRESENT   Basic metabolic panel     Status: Abnormal   Collection Time: 11/07/17  5:30 AM  Result Value Ref Range  Sodium 139 135 - 145 mmol/L   Potassium 3.3 (L) 3.5 - 5.1 mmol/L   Chloride 102 101 - 111 mmol/L   CO2 26 22 - 32 mmol/L   Glucose, Bld 109 (H) 65 - 99 mg/dL   BUN 12 6 - 20 mg/dL   Creatinine, Ser 1.610.81 0.44 - 1.00 mg/dL   Calcium 8.8 (L) 8.9 - 10.3 mg/dL   GFR calc non Af Amer >60 >60 mL/min   GFR calc Af Amer >60 >60 mL/min   Anion gap 11 5 - 15  CBC     Status: Abnormal   Collection Time: 11/07/17  5:30 AM  Result Value Ref Range   WBC 6.6 4.0 - 10.5 K/uL   RBC 4.66 3.87 - 5.11 MIL/uL   Hemoglobin 13.1 12.0 - 15.0 g/dL   HCT 09.639.9 04.536.0 - 40.946.0 %   MCV 85.6 78.0 - 100.0 fL   MCH 28.1 26.0 - 34.0 pg   MCHC 32.8 30.0 - 36.0 g/dL   RDW 81.113.3 91.411.5 - 78.215.5 %   Platelets 428 (H) 150 - 400 K/uL     Studies/Results Radiology     MEDS, Scheduled . enoxaparin (LOVENOX) injection  40 mg Subcutaneous Q24H  . latanoprost  1 drop Right Eye QHS  . levothyroxine  37.5 mcg Intravenous Daily  . pantoprazole (PROTONIX) IV  40 mg Intravenous Q24H     Assessment: SBO (small bowel obstruction) (HCC)   Plan: SBO protocol started.  Contrast from previous CT noted in  colon on AXR.      LOS: 1 day    Vanita PandaAlicia C Huey Scalia, MD [facility] [place] [phone]   11/07/2017 9:11 AM

## 2017-11-07 NOTE — Progress Notes (Signed)
PROGRESS NOTE    JAMYAH FOLK  ZOX:[id number] DOB: Aug 14, 1961 DOA: 11/06/2017 PCP: Darrow Bussing, MD    Brief Narrative:  57 year old female who presented abdominal pain.  She has a significant past medical history of small bowel obstruction status post lysis of adhesions February 2018.  She complained of 3 weeks of intermittent upper abdominal pain, persistent and worsening, complicated by vomiting for last 24 hours prior to hospitalization, she was worked out as an outpatient with CT abdomen suggesting possible internal hernia with small bowel obstruction.  On her initial physical examination blood pressure 153/79, heart rate 68, respiratory 16, temperature 97.9, oxygen saturation 100%, moist mucous membranes, lungs are clear to auscultation bilaterally, heart S1-S2 present rhythmic, the abdomen was nontender, bowel sounds were positive, no lower extremity edema.  Sodium 138, potassium 3.5, chloride 99, bicarb 28, glucose 111, BUN 12, creatinine 0.94, white count 6.8, he will 13.6, heart rate of 1.8, platelets 437 urinalysis negative for infection.  Patient was admitted to the hospital with the working diagnosis of small bowel obstruction.   Assessment & Plan:   Principal Problem:   SBO (small bowel obstruction) (HCC) Active Problems:   Hypothyroidism   1.  Small bowel obstruction. Will continue conservative medical care with NG tube to low intermittent suction, continue IV fluids with dextrose - lactated ringers at 100 ml per hour. and as needed anti-emetics. Follow with surgery recommendations. Continue to keep patient NPO. Continue antiacid therapy with pantoprazole.   2. Hypokalemia. Will continue serum K repletion with IV Kcl, will follow on renal panel in am, check serum Mg. Renal function preserved with serum cr at 0.81. Continue balanced electrolyte solutions IV plus dextrose for hydration. Gastric output 250 ml.   2.  Hypothyroidism. Continue levothyroxine   DVT  prophylaxis: enoxaparin  Code Status: full Family Communication: no family at the bedside Disposition Plan: home   Consultants:   Surgery  Procedures:     Antimicrobials:       Subjective: Patient with improved abdominal distention, has NG tube in place, no nausea or vomiting, no bowel movement. Positive sore throat.    Objective: Vitals:   11/06/17 2358 11/07/17 0054 11/07/17 0149 11/07/17 0635  BP: (!) 174/67 (!) 179/71 (!) 168/66 (!) 168/66  Pulse: 66 60  67  Resp: 20 15  13   Temp:  98.2 F (36.8 C)  98.1 F (36.7 C)  TempSrc:  Oral  Oral  SpO2: 99% 100%  100%  Weight:  75.2 kg (165 lb 12.6 oz)    Height:  5\' 2"  (1.575 m)      Intake/Output Summary (Last 24 hours) at 11/07/2017 1227 Last data filed at 11/07/2017 0810 Gross per 24 hour  Intake 538.33 ml  Output 550 ml  Net -11.67 ml   Filed Weights   11/07/17 0054  Weight: 75.2 kg (165 lb 12.6 oz)    Examination:   General: Not in pain or dyspnea, deconditioned Neurology: Awake and alert, non focal  E ENT: no pallor, no icterus, oral mucosa moist/ ng tube in place.  Cardiovascular: No JVD. S1-S2 present, rhythmic, no gallops, rubs, or murmurs. No lower extremity edema. Pulmonary: decreased breath sounds bilaterally at bases, poor air movement, no wheezing, rhonchi or rales. Gastrointestinal. Abdomen distended, no organomegaly, non tender, no rebound or guarding Skin. No rashes Musculoskeletal: no joint deformities     Data Reviewed: I have personally reviewed following labs and imaging studies  CBC: Recent Labs  Lab 11/06/17 1708 11/07/17 0530  WBC 6.8 6.6  HGB 13.6 13.1  HCT 41.8 39.9  MCV 85.3 85.6  PLT 437* 428*   Basic Metabolic Panel: Recent Labs  Lab 11/06/17 1708 11/07/17 0530  NA 138 139  K 3.5 3.3*  CL 99* 102  CO2 28 26  GLUCOSE 111* 109*  BUN 12 12  CREATININE 0.94 0.81  CALCIUM 9.6 8.8*   GFR: Estimated Creatinine Clearance: 71.8 mL/min (by C-G formula based on SCr  of 0.81 mg/dL). Liver Function Tests: Recent Labs  Lab 11/06/17 1708  AST 22  ALT 16  ALKPHOS 71  BILITOT 0.9  PROT 7.6  ALBUMIN 4.2   Recent Labs  Lab 11/06/17 1708  LIPASE 24   No results for input(s): AMMONIA in the last 168 hours. Coagulation Profile: No results for input(s): INR, PROTIME in the last 168 hours. Cardiac Enzymes: No results for input(s): CKTOTAL, CKMB, CKMBINDEX, TROPONINI in the last 168 hours. BNP (last 3 results) No results for input(s): PROBNP in the last 8760 hours. HbA1C: No results for input(s): HGBA1C in the last 72 hours. CBG: No results for input(s): GLUCAP in the last 168 hours. Lipid Profile: No results for input(s): CHOL, HDL, LDLCALC, TRIG, CHOLHDL, LDLDIRECT in the last 72 hours. Thyroid Function Tests: No results for input(s): TSH, T4TOTAL, FREET4, T3FREE, THYROIDAB in the last 72 hours. Anemia Panel: No results for input(s): VITAMINB12, FOLATE, FERRITIN, TIBC, IRON, RETICCTPCT in the last 72 hours.    Radiology Studies: I have reviewed all of the imaging during this hospital visit personally     Scheduled Meds: . enoxaparin (LOVENOX) injection  40 mg Subcutaneous Q24H  . latanoprost  1 drop Right Eye QHS  . levothyroxine  37.5 mcg Intravenous Daily  . pantoprazole (PROTONIX) IV  40 mg Intravenous Q24H   Continuous Infusions: . sodium chloride 100 mL/hr at 11/07/17 1151     LOS: 1 day        Coralie KeensMauricio Daniel Arrien, MD [facility] Pager [phone]

## 2017-11-07 NOTE — ED Notes (Signed)
ED TO INPATIENT HANDOFF REPORT  Name/Age/Gender Debbie Brandt 57 y.o. female  Code Status    Code Status Orders  (From admission, onward)        Start     Ordered   11/06/17 2308  Full code  Continuous     11/06/17 2308    Code Status History    Date Active Date Inactive Code Status Order ID Comments User Context   10/23/2016 20:41 11/04/2016 19:25 Full Code [id number]  Lily Kocher, MD Inpatient      Home/SNF/Other Home  Chief Complaint possible blockage in abdomin. Sent by PCP  Level of Care/Admitting Diagnosis ED Disposition    ED Disposition Condition Comment   Admit  Hospital Area: [facility] [[id number]]  Level of Care: Med-Surg [16]  Diagnosis: SBO (small bowel obstruction) [facility]) [244010]  Admitting Physician: Doreatha Massed  Attending Physician: Etta Quill [phone]  Estimated length of stay: past midnight tomorrow  Certification:: I certify this patient will need inpatient services for at least 2 midnights  PT Class (Do Not Modify): Inpatient [101]  PT Acc Code (Do Not Modify): Private [1]       Medical History Past Medical History:  Diagnosis Date  . Chest pain 09/26/2015  . Essential hypertension 09/26/2015  . GERD (gastroesophageal reflux disease)   . Hypertension   . Hypothyroidism 09/26/2015  . Murmur 09/26/2015  . Thyroid disease     Allergies No Known Allergies  IV Location/Drains/Wounds Patient Lines/Drains/Airways Status   Active Line/Drains/Airways    Name:   Placement date:   Placement time:   Site:   Days:   Peripheral IV 11/06/17 Left Antecubital   11/06/17    2122    Antecubital   1   NG/OG Tube Nasogastric 14 Fr. Right nare Xray Documented cm marking at nare/ corner of mouth 26 cm   11/06/17    2235    Right nare   1   Incision (Closed) 10/28/16 Abdomen   10/28/16    1205     375          Labs/Imaging Results for orders placed or performed during the hospital encounter of 11/06/17 (from the past  48 hour(s))  Lipase, blood     Status: None   Collection Time: 11/06/17  5:08 PM  Result Value Ref Range   Lipase 24 11 - 51 U/L    Comment: Performed at [facility], [place] [address] [place], [place] [address]  Comprehensive metabolic panel     Status: Abnormal   Collection Time: 11/06/17  5:08 PM  Result Value Ref Range   Sodium 138 135 - 145 mmol/L   Potassium 3.5 3.5 - 5.1 mmol/L   Chloride 99 (L) 101 - 111 mmol/L   CO2 28 22 - 32 mmol/L   Glucose, Bld 111 (H) 65 - 99 mg/dL   BUN 12 6 - 20 mg/dL   Creatinine, Ser 0.94 0.44 - 1.00 mg/dL   Calcium 9.6 8.9 - 10.3 mg/dL   Total Protein 7.6 6.5 - 8.1 g/dL   Albumin 4.2 3.5 - 5.0 g/dL   AST 22 15 - 41 U/L   ALT 16 14 - 54 U/L   Alkaline Phosphatase 71 38 - 126 U/L   Total Bilirubin 0.9 0.3 - 1.2 mg/dL   GFR calc non Af Amer >60 >60 mL/min   GFR calc Af Amer >60 >60 mL/min    Comment: (NOTE) The eGFR has been  calculated using the CKD EPI equation. This calculation has not been validated in all clinical situations. eGFR's persistently <60 mL/min signify possible Chronic Kidney Disease.    Anion gap 11 5 - 15    Comment: Performed at [facility], [place] [address], [place], [place] [address]  CBC     Status: Abnormal   Collection Time: 11/06/17  5:08 PM  Result Value Ref Range   WBC 6.8 4.0 - 10.5 K/uL   RBC 4.90 3.87 - 5.11 MIL/uL   Hemoglobin 13.6 12.0 - 15.0 g/dL   HCT 41.8 36.0 - 46.0 %   MCV 85.3 78.0 - 100.0 fL   MCH 27.8 26.0 - 34.0 pg   MCHC 32.5 30.0 - 36.0 g/dL   RDW 13.2 11.5 - 15.5 %   Platelets 437 (H) 150 - 400 K/uL    Comment: Performed at [facility], [place] [address] [place], [place] [address]  Urinalysis, Routine w reflex microscopic     Status: Abnormal   Collection Time: 11/06/17  9:05 PM  Result Value Ref Range   Color, Urine YELLOW YELLOW   APPearance CLEAR CLEAR   Specific Gravity, Urine >1.046 (H) 1.005 - 1.030   pH 5.0 5.0 - 8.0   Glucose,  UA NEGATIVE NEGATIVE mg/dL   Hgb urine dipstick SMALL (A) NEGATIVE   Bilirubin Urine NEGATIVE NEGATIVE   Ketones, ur 20 (A) NEGATIVE mg/dL   Protein, ur NEGATIVE NEGATIVE mg/dL   Nitrite NEGATIVE NEGATIVE   Leukocytes, UA NEGATIVE NEGATIVE   RBC / HPF 0-5 0 - 5 RBC/hpf   WBC, UA 0-5 0 - 5 WBC/hpf   Bacteria, UA NONE SEEN NONE SEEN   Squamous Epithelial / LPF 0-5 (A) NONE SEEN   Mucus PRESENT     Comment: Performed at [facility], [place] [address], [place], [place] [address]   Dg Abdomen 1 View  Result Date: 11/06/2017 CLINICAL DATA:  Evaluate nasogastric tube placement. EXAM: ABDOMEN - 1 VIEW COMPARISON:  Abdominal radiograph October 28, 2016 and CT abdomen and pelvis October 16, 2017 FINDINGS: Nasogastric tube courses in distribution stomach with distal tip projecting in mid to distal duodenum. Included abdomen demonstrates mildly distended small bowel. Stool and air in the included large bowel. IMPRESSION: Nasogastric tube tip projecting in mid to distal duodenum. Included abdomen demonstrates mildly prominent small bowel, possible early small bowel obstruction. Electronically Signed   By: Elon Alas M.D.   On: 11/06/2017 23:04    Pending Labs Unresulted Labs (From admission, onward)   Start     Ordered   11/07/17 9242  Basic metabolic panel  Tomorrow morning,   R     11/06/17 2308   11/07/17 0500  CBC  Tomorrow morning,   R     11/06/17 2308   11/06/17 2307  HIV antibody (Routine Testing)  Once,   R     11/06/17 2308      Vitals/Pain Today's Vitals   11/06/17 1706 11/06/17 1927 11/06/17 2358  BP: (!) 153/79 (!) 157/72 (!) 174/67  Pulse: 68 62 66  Resp: _0 Temp: 97.9 F (36.6 C)    TempSrc: Oral    SpO2: 100% 100% 99%  PainSc: 6       Isolation Precautions No active isolations  Medications Medications  0.9 %  sodium chloride infusion (not administered)  latanoprost (XALATAN) 0.005 % ophthalmic solution 1 drop (not administered)   levothyroxine (SYNTHROID, LEVOTHROID) injection 37.5 mcg (not  administered)  pantoprazole (PROTONIX) injection 40 mg (not administered)  morphine 2 MG/ML injection 2-4 mg (not administered)  acetaminophen (TYLENOL) tablet 650 mg (not administered)    Or  acetaminophen (TYLENOL) suppository 650 mg (not administered)  ondansetron (ZOFRAN) tablet 4 mg (not administered)    Or  ondansetron (ZOFRAN) injection 4 mg (not administered)  enoxaparin (LOVENOX) injection 40 mg (not administered)  sodium chloride 0.9 % bolus 1,000 mL (1,000 mLs Intravenous New Bag/Given 11/06/17 2122)  lidocaine (XYLOCAINE) 2 % jelly 1 application (1 application Other Given 11/06/17 2239)    Mobility walks.

## 2017-11-08 ENCOUNTER — Inpatient Hospital Stay (HOSPITAL_COMMUNITY): Payer: Managed Care, Other (non HMO)

## 2017-11-08 DIAGNOSIS — E876 Hypokalemia: Secondary | ICD-10-CM

## 2017-11-08 LAB — CBC WITH DIFFERENTIAL/PLATELET
Basophils Absolute: 0 10*3/uL (ref 0.0–0.1)
Basophils Relative: 0 %
Eosinophils Absolute: 0.1 10*3/uL (ref 0.0–0.7)
Eosinophils Relative: 2 %
HCT: 37.1 % (ref 36.0–46.0)
HEMOGLOBIN: 12 g/dL (ref 12.0–15.0)
LYMPHS ABS: 1.3 10*3/uL (ref 0.7–4.0)
LYMPHS PCT: 24 %
MCH: 28.2 pg (ref 26.0–34.0)
MCHC: 32.3 g/dL (ref 30.0–36.0)
MCV: 87.1 fL (ref 78.0–100.0)
MONOS PCT: 9 %
Monocytes Absolute: 0.5 10*3/uL (ref 0.1–1.0)
Neutro Abs: 3.6 10*3/uL (ref 1.7–7.7)
Neutrophils Relative %: 65 %
Platelets: 381 10*3/uL (ref 150–400)
RBC: 4.26 MIL/uL (ref 3.87–5.11)
RDW: 13.4 % (ref 11.5–15.5)
WBC: 5.5 10*3/uL (ref 4.0–10.5)

## 2017-11-08 LAB — BASIC METABOLIC PANEL
Anion gap: 11 (ref 5–15)
BUN: 11 mg/dL (ref 6–20)
CHLORIDE: 103 mmol/L (ref 101–111)
CO2: 28 mmol/L (ref 22–32)
CREATININE: 0.89 mg/dL (ref 0.44–1.00)
Calcium: 8.6 mg/dL — ABNORMAL LOW (ref 8.9–10.3)
GFR calc Af Amer: >60 mL/min (ref >60)
GFR calc non Af Amer: >60 mL/min (ref >60)
Glucose, Bld: 118 mg/dL — ABNORMAL HIGH (ref 65–99)
POTASSIUM: 3.6 mmol/L (ref 3.5–5.1)
SODIUM: 142 mmol/L (ref 135–145)

## 2017-11-08 LAB — MAGNESIUM: Magnesium: 2 mg/dL (ref 1.7–2.4)

## 2017-11-08 MED ORDER — POTASSIUM CHLORIDE 10 MEQ/100ML IV SOLN
10.0000 meq | INTRAVENOUS | Status: AC
  Administered 2017-11-08 (×4): 10 meq via INTRAVENOUS
  Filled 2017-11-08 (×4): qty 100

## 2017-11-08 MED ORDER — KETOROLAC TROMETHAMINE 30 MG/ML IJ SOLN
30.0000 mg | Freq: Three times a day (TID) | INTRAMUSCULAR | Status: AC | PRN
  Administered 2017-11-08 – 2017-11-09 (×2): 30 mg via INTRAVENOUS
  Filled 2017-11-08 (×2): qty 1

## 2017-11-08 NOTE — Progress Notes (Signed)
Patient ID: Debbie Brandt, female   DOB: 03/11/1961, 57 y.o.   MRN: [id number]   Acute Care Surgery Service Progress Note:    Chief Complaint/Subjective: Was not having any abd pain but just started having some abd pain just now No flatus Documented 1300 cc/24hrs out of NG  Objective: Vital signs in last 24 hours: Temp:  [98.7 F (37.1 C)-98.8 F (37.1 C)] 98.8 F (37.1 C) (02/23 0754) Pulse Rate:  [59-65] 65 (02/23 0754) Resp:  [13-17] 17 (02/23 0754) BP: (140-173)/(60-66) 140/65 (02/23 0754) SpO2:  [99 %-100 %] 99 % (02/23 0754) Last BM Date: 11/06/17  Intake/Output from previous day: 02/22 0701 - 02/23 0700 In: 1133.3 [I.V.:733.3; IV Piggyback:400] Out: 1300 [Emesis/NG output:1300] Intake/Output this shift: No intake/output data recorded.  Lungs: cta, nonlabored  Cardiovascular: reg  Abd: soft, non distended, some mild TTP, hypoBS  Extremities: no edema, +SCDs  Neuro: alert, nonfocal  Lab Results: CBC  Recent Labs    11/07/17 0530 11/08/17 0544  WBC 6.6 5.5  HGB 13.1 12.0  HCT 39.9 37.1  PLT 428* 381   BMET Recent Labs    11/07/17 0530 11/08/17 0544  NA 139 142  K 3.3* 3.6  CL 102 103  CO2 26 28  GLUCOSE 109* 118*  BUN 12 11  CREATININE 0.81 0.89  CALCIUM 8.8* 8.6*   LFT Hepatic Function Latest Ref Rng & Units 11/06/2017 10/16/2017 10/23/2016  Total Protein 6.5 - 8.1 g/dL 7.6 7.2 7.3  Albumin 3.5 - 5.0 g/dL 4.2 4.1 4.1  AST 15 - 41 U/L _0 ALT 14 - 54 U/L _1 Alk Phosphatase 38 - 126 U/L 71 65 58  Total Bilirubin 0.3 - 1.2 mg/dL 0.9 0.5 0.6   PT/INR No results for input(s): LABPROT, INR in the last 72 hours. ABG No results for input(s): PHART, HCO3 in the last 72 hours.  Invalid input(s): PCO2, PO2  Studies/Results:  Anti-infectives: Anti-infectives (From admission, onward)   None      Medications: Scheduled Meds: . enoxaparin (LOVENOX) injection  40 mg Subcutaneous Q24H  . latanoprost  1 drop Right Eye QHS  .  levothyroxine  37.5 mcg Intravenous Daily  . pantoprazole (PROTONIX) IV  40 mg Intravenous Q24H   Continuous Infusions: . dextrose 5% lactated ringers 100 mL/hr at 11/08/17 0339   PRN Meds:.acetaminophen **OR** acetaminophen, hydrALAZINE, ketorolac, morphine injection, ondansetron **OR** ondansetron (ZOFRAN) IV, phenol  Assessment/Plan: Patient Active Problem List   Diagnosis Date Noted  . SBO (small bowel obstruction) ([place]) 11/06/2017  . Small bowel obstruction due to adhesions ([place]) 10/28/2016  . Small bowel obstruction s/p ex lap & lysis of adhesions 10/28/2016 10/23/2016  . Abdominal mass 10/23/2016  . Hypokalemia 10/23/2016  . Murmur 09/26/2015  . Chest pain 09/26/2015  . HTN (hypertension) 09/26/2015  . Hypothyroidism 09/26/2015   s/p   SBO  No fever, no tachycardia, no wbc. Contrast from CT in rt colon. Some small amount of SB distension.  Cont NG tube decompression today Add prn toradol for pain control Repeat labs/imaging in am Hopefully can clamp/sips of clears Sunday  Ambulate, pulm toilet Cont chemical VTE prophylaxis  Leighton Ruff. Redmond Pulling, MD, FACS General, Bariatric, & Minimally Invasive Surgery [facility], [place]  Disposition:  LOS: 2 days    Leighton Ruff. Redmond Pulling, MD, FACS General, Bariatric, & Minimally Invasive Surgery [phone] [facility]

## 2017-11-08 NOTE — Progress Notes (Signed)
PROGRESS NOTE    Debbie Brandt  ZOX:[id number] DOB: Mar 09, 1961 DOA: 11/06/2017 PCP: Darrow Bussing, MD    Brief Narrative:  57 year old female who presented abdominal pain.  She has a significant past medical history of small bowel obstruction status post lysis of adhesions February 2018.  She complained of 3 weeks of intermittent upper abdominal pain, persistent and worsening, complicated by vomiting for last 24 hours prior to hospitalization, she was worked out as an outpatient with CT abdomen suggesting possible internal hernia with small bowel obstruction.  On her initial physical examination blood pressure 153/79, heart rate 68, respiratory 16, temperature 97.9, oxygen saturation 100%, moist mucous membranes, lungs are clear to auscultation bilaterally, heart S1-S2 present rhythmic, the abdomen was nontender, bowel sounds were positive, no lower extremity edema.  Sodium 138, potassium 3.5, chloride 99, bicarb 28, glucose 111, BUN 12, creatinine 0.94, white count 6.8, he will 13.6, heart rate of 1.8, platelets 437 urinalysis negative for infection.  Patient was admitted to the hospital with the working diagnosis of small bowel obstruction   Assessment & Plan:   Principal Problem:   SBO (small bowel obstruction) (HCC) Active Problems:   Hypothyroidism  1.  Small bowel obstruction.  NG tube to low intermittent suction, output 1300 ml over last 24 hours, will continue IV fluids with dextrose - lactated ringers at 75 ml per hour. Continue antiacid and anti-emetics, follow with surgery recommendations.  2. Hypokalemia. Serum K up to 3,6, will continue repletion with IV Kcl, renal function preserved with serum cr at 0.89. Will continue IV fluids with balanced electrolyte solutions, serum Mg at 2,0. Follow renal panel in am, avoid hypotension and nephrotoxic medications.   2.  Hypothyroidism. Continue levothyroxine IV, due to NPO status.    DVT prophylaxis: enoxaparin  Code Status:  full Family Communication: no family at the bedside Disposition Plan: home   Consultants:   Surgery  Procedures:     Antimicrobials:      Subjective: Feeling better, improved abdominal distention, no flatus or bowel movement, no nausea or vomiting, ng tube in place.   Objective: Vitals:   11/07/17 2325 11/08/17 0033 11/08/17 0754 11/08/17 1337  BP: (!) 173/65 (!) 167/66 140/65 (!) 126/47  Pulse:   65 65  Resp:   17 17  Temp:   98.8 F (37.1 C) 99.5 F (37.5 C)  TempSrc:   Oral Oral  SpO2:   99% 100%  Weight:      Height:        Intake/Output Summary (Last 24 hours) at 11/08/2017 1355 Last data filed at 11/08/2017 0600 Gross per 24 hour  Intake 613.34 ml  Output 1300 ml  Net -686.66 ml   Filed Weights   11/07/17 0054  Weight: 75.2 kg (165 lb 12.6 oz)    Examination:   General: Not in pain or dyspnea, deconditioned Neurology: Awake and alert, non focal  E ENT: no pallor, no icterus, oral mucosa moist. Ng tube in place Cardiovascular: No JVD. S1-S2 present, rhythmic, no gallops, rubs, or murmurs. No lower extremity edema. Pulmonary: vesicular breath sounds bilaterally, adequate air movement, no wheezing, rhonchi or rales. Gastrointestinal. Abdomen flat, no organomegaly, non tender, no rebound or guarding Skin. No rashes Musculoskeletal: no joint deformities     Data Reviewed: I have personally reviewed following labs and imaging studies  CBC: Recent Labs  Lab 11/06/17 1708 11/07/17 0530 11/08/17 0544  WBC 6.8 6.6 5.5  NEUTROABS  --   --  3.6  HGB  13.6 13.1 12.0  HCT 41.8 39.9 37.1  MCV 85.3 85.6 87.1  PLT 437* 428* 381   Basic Metabolic Panel: Recent Labs  Lab 11/06/17 1708 11/07/17 0530 11/08/17 0544  NA 138 139 142  K 3.5 3.3* 3.6  CL 99* 102 103  CO2 28 26 28   GLUCOSE 111* 109* 118*  BUN 12 12 11   CREATININE 0.94 0.81 0.89  CALCIUM 9.6 8.8* 8.6*  MG  --   --  2.0   GFR: Estimated Creatinine Clearance: 65.4 mL/min (by C-G  formula based on SCr of 0.89 mg/dL). Liver Function Tests: Recent Labs  Lab 11/06/17 1708  AST 22  ALT 16  ALKPHOS 71  BILITOT 0.9  PROT 7.6  ALBUMIN 4.2   Recent Labs  Lab 11/06/17 1708  LIPASE 24   No results for input(s): AMMONIA in the last 168 hours. Coagulation Profile: No results for input(s): INR, PROTIME in the last 168 hours. Cardiac Enzymes: No results for input(s): CKTOTAL, CKMB, CKMBINDEX, TROPONINI in the last 168 hours. BNP (last 3 results) No results for input(s): PROBNP in the last 8760 hours. HbA1C: No results for input(s): HGBA1C in the last 72 hours. CBG: No results for input(s): GLUCAP in the last 168 hours. Lipid Profile: No results for input(s): CHOL, HDL, LDLCALC, TRIG, CHOLHDL, LDLDIRECT in the last 72 hours. Thyroid Function Tests: No results for input(s): TSH, T4TOTAL, FREET4, T3FREE, THYROIDAB in the last 72 hours. Anemia Panel: No results for input(s): VITAMINB12, FOLATE, FERRITIN, TIBC, IRON, RETICCTPCT in the last 72 hours.    Radiology Studies: I have reviewed all of the imaging during this hospital visit personally     Scheduled Meds: . enoxaparin (LOVENOX) injection  40 mg Subcutaneous Q24H  . latanoprost  1 drop Right Eye QHS  . levothyroxine  37.5 mcg Intravenous Daily  . pantoprazole (PROTONIX) IV  40 mg Intravenous Q24H   Continuous Infusions: . dextrose 5% lactated ringers 100 mL/hr at 11/08/17 0339     LOS: 2 days        Coralie KeensMauricio Daniel Cartier Mapel, MD [facility] Pager [phone]

## 2017-11-08 NOTE — Progress Notes (Signed)
14 Fr. NGT reinserted in left nare at 2030. Initial placement confirmation. DG Abd 1 view ordered.

## 2017-11-09 ENCOUNTER — Inpatient Hospital Stay (HOSPITAL_COMMUNITY): Payer: Managed Care, Other (non HMO)

## 2017-11-09 LAB — CBC WITH DIFFERENTIAL/PLATELET
BASOS PCT: 0 %
Basophils Absolute: 0 10*3/uL (ref 0.0–0.1)
EOS PCT: 4 %
Eosinophils Absolute: 0.2 10*3/uL (ref 0.0–0.7)
HCT: 36.1 % (ref 36.0–46.0)
Hemoglobin: 11.5 g/dL — ABNORMAL LOW (ref 12.0–15.0)
Lymphocytes Relative: 31 %
Lymphs Abs: 1.3 10*3/uL (ref 0.7–4.0)
MCH: 27.8 pg (ref 26.0–34.0)
MCHC: 31.9 g/dL (ref 30.0–36.0)
MCV: 87.4 fL (ref 78.0–100.0)
MONO ABS: 0.3 10*3/uL (ref 0.1–1.0)
Monocytes Relative: 8 %
Neutro Abs: 2.3 10*3/uL (ref 1.7–7.7)
Neutrophils Relative %: 57 %
PLATELETS: 347 10*3/uL (ref 150–400)
RBC: 4.13 MIL/uL (ref 3.87–5.11)
RDW: 13.3 % (ref 11.5–15.5)
WBC: 4 10*3/uL (ref 4.0–10.5)

## 2017-11-09 LAB — BASIC METABOLIC PANEL
Anion gap: 9 (ref 5–15)
BUN: 7 mg/dL (ref 6–20)
CALCIUM: 8.3 mg/dL — AB (ref 8.9–10.3)
CO2: 26 mmol/L (ref 22–32)
CREATININE: 0.83 mg/dL (ref 0.44–1.00)
Chloride: 105 mmol/L (ref 101–111)
GFR calc Af Amer: >60 mL/min (ref >60)
GLUCOSE: 101 mg/dL — AB (ref 65–99)
Potassium: 3.7 mmol/L (ref 3.5–5.1)
Sodium: 140 mmol/L (ref 135–145)

## 2017-11-09 MED ORDER — POTASSIUM CHLORIDE 10 MEQ/100ML IV SOLN
10.0000 meq | INTRAVENOUS | Status: AC
  Administered 2017-11-09 (×2): 10 meq via INTRAVENOUS
  Filled 2017-11-09 (×2): qty 100

## 2017-11-09 MED ORDER — BISACODYL 10 MG RE SUPP
10.0000 mg | Freq: Once | RECTAL | Status: AC
  Administered 2017-11-09: 11:00:00 10 mg via RECTAL
  Filled 2017-11-09: qty 1

## 2017-11-09 NOTE — Progress Notes (Signed)
PROGRESS NOTE    Debbie Brandt  VWU:[id number] DOB: 1961/01/15 DOA: 11/06/2017 PCP: Darrow Bussing, MD    Brief Narrative:  57 year old female who presented abdominal pain.She has a significantpast medical history of small bowel obstruction status post lysis of adhesions February 2018.She complained of 3 weeks of intermittent upper abdominal pain, persistent and worsening,complicated by vomiting for last 24 hours prior to hospitalization, she was worked out as an outpatient with CT abdomen suggesting possible internal hernia with small bowel obstruction.On her initial physical examination blood pressure 153/79, heart rate 68, respiratory 16, temperature 97.9, oxygen saturation 100%, moist mucous membranes,lungs are clear to auscultation bilaterally, heart S1-S2 present rhythmic, the abdomen was nontender, bowel soundswerepositive, no lower extremity edema.Sodium 138, potassium 3.5, chloride 99, bicarb 28, glucose 111, BUN 12, creatinine 0.94, white count 6.8, he will 13.6, heart rate of 1.8, platelets 437 urinalysis negative for infection.  Patient was admitted to the hospitalwith theworking diagnosis of small bowel obstruction   Assessment & Plan:   Principal Problem:   SBO (small bowel obstruction) (HCC) Active Problems:   Hypothyroidism   1.Small bowel obstruction.  NG tube with decreased output plan to clamp the tube today, clinically abdomen less distended, no nausea or vomiting, no abdominal pain, will continue to follow surgery recommendations. Continue hydration with IV fluids.  2. Hypokalemia. Serum K up to 3,7, will continue repletion with IV Kcl. Will follow with renal panel in am. Serum cr stable at 0.83 with serum bicarbonate at 26 and Na at 140.   3.Hypothyroidism. For now continue levothyroxine IV, while patient continue to be under NPO status.    DVT prophylaxis:enoxaparin Code Status:full Family Communication:no family at the  bedside Disposition Plan:home   Consultants:  Surgery  Procedures:    Antimicrobials:     Subjective: Patient is feeling better, decrease ng output, no nausea or vomiting, no dyspnea or chest pain, no fever or chills.   Objective: Vitals:   11/08/17 0754 11/08/17 1337 11/08/17 2211 11/09/17 0459  BP: 140/65 (!) 126/47 136/65 (!) 144/59  Pulse: 65 65 64 (!) 59  Resp: 17 17 15 18   Temp: 98.8 F (37.1 C) 99.5 F (37.5 C) 98.7 F (37.1 C) 98.5 F (36.9 C)  TempSrc: Oral Oral Oral Oral  SpO2: 99% 100% 100% 99%  Weight:      Height:        Intake/Output Summary (Last 24 hours) at 11/09/2017 1111 Last data filed at 11/09/2017 2956 Gross per 24 hour  Intake 3804.59 ml  Output 250 ml  Net 3554.59 ml   Filed Weights   11/07/17 0054  Weight: 75.2 kg (165 lb 12.6 oz)    Examination:   General: Not in pain or dyspnea, deconditioned Neurology: Awake and alert, non focal  E ENT: mild pallor, no icterus, oral mucosa moist, NG tube in place.  Cardiovascular: No JVD. S1-S2 present, rhythmic, no gallops, rubs, or murmurs. No lower extremity edema. Pulmonary: decreased breath sounds bilaterally at bases, adequate air movement, no wheezing, rhonchi or rales. Gastrointestinal. Abdomen mild distention, no organomegaly, non tender, no rebound or guarding Skin. No rashes Musculoskeletal: no joint deformities     Data Reviewed: I have personally reviewed following labs and imaging studies  CBC: Recent Labs  Lab 11/06/17 1708 11/07/17 0530 11/08/17 0544 11/09/17 0552  WBC 6.8 6.6 5.5 4.0  NEUTROABS  --   --  3.6 2.3  HGB 13.6 13.1 12.0 11.5*  HCT 41.8 39.9 37.1 36.1  MCV 85.3 85.6 87.1  87.4  PLT 437* 428* 381 347   Basic Metabolic Panel: Recent Labs  Lab 11/06/17 1708 11/07/17 0530 11/08/17 0544 11/09/17 0552  NA 138 139 142 140  K 3.5 3.3* 3.6 3.7  CL 99* 102 103 105  CO2 28 26 28 26   GLUCOSE 111* 109* 118* 101*  BUN 12 12 11 7   CREATININE 0.94  0.81 0.89 0.83  CALCIUM 9.6 8.8* 8.6* 8.3*  MG  --   --  2.0  --    GFR: Estimated Creatinine Clearance: 70.1 mL/min (by C-G formula based on SCr of 0.83 mg/dL). Liver Function Tests: Recent Labs  Lab 11/06/17 1708  AST 22  ALT 16  ALKPHOS 71  BILITOT 0.9  PROT 7.6  ALBUMIN 4.2   Recent Labs  Lab 11/06/17 1708  LIPASE 24   No results for input(s): AMMONIA in the last 168 hours. Coagulation Profile: No results for input(s): INR, PROTIME in the last 168 hours. Cardiac Enzymes: No results for input(s): CKTOTAL, CKMB, CKMBINDEX, TROPONINI in the last 168 hours. BNP (last 3 results) No results for input(s): PROBNP in the last 8760 hours. HbA1C: No results for input(s): HGBA1C in the last 72 hours. CBG: No results for input(s): GLUCAP in the last 168 hours. Lipid Profile: No results for input(s): CHOL, HDL, LDLCALC, TRIG, CHOLHDL, LDLDIRECT in the last 72 hours. Thyroid Function Tests: No results for input(s): TSH, T4TOTAL, FREET4, T3FREE, THYROIDAB in the last 72 hours. Anemia Panel: No results for input(s): VITAMINB12, FOLATE, FERRITIN, TIBC, IRON, RETICCTPCT in the last 72 hours.    Radiology Studies: I have reviewed all of the imaging during this hospital visit personally     Scheduled Meds: . enoxaparin (LOVENOX) injection  40 mg Subcutaneous Q24H  . latanoprost  1 drop Right Eye QHS  . levothyroxine  37.5 mcg Intravenous Daily  . pantoprazole (PROTONIX) IV  40 mg Intravenous Q24H   Continuous Infusions: . dextrose 5% lactated ringers 75 mL/hr at 11/09/17 0454     LOS: 3 days        [name], MD [facility] Pager [phone]

## 2017-11-09 NOTE — Progress Notes (Signed)
Patient ID: Debbie Brandt, female   DOB: 1961-05-20, 57 y.o.   MRN: [id number]   Acute Care Surgery Service Progress Note:    Chief Complaint/Subjective: Pain better. No n/v. No flatus.  Didn't get out of bed yesterday 600cc/24hrs NG per nurse - 300cc per shift  Objective: Vital signs in last 24 hours: Temp:  [98.5 F (36.9 C)-99.5 F (37.5 C)] 98.5 F (36.9 C) (02/24 0459) Pulse Rate:  [59-65] 59 (02/24 0459) Resp:  [15-18] 18 (02/24 0459) BP: (126-144)/(47-65) 144/59 (02/24 0459) SpO2:  [99 %-100 %] 99 % (02/24 0459) Last BM Date: 11/06/17  Intake/Output from previous day: 02/23 0701 - 02/24 0700 In: 3804.6 [I.V.:3704.6; IV Piggyback:100] Out: 250 [Urine:250] Intake/Output this shift: No intake/output data recorded.  Lungs: cta, nonlabored  Cardiovascular: reg  Abd: soft, less tenderness, hypoBS, not really distended.   Extremities: no edema, +SCDs  Neuro: alert, nonfocal  Lab Results: CBC  Recent Labs    11/08/17 0544 11/09/17 0552  WBC 5.5 4.0  HGB 12.0 11.5*  HCT 37.1 36.1  PLT 381 347   BMET Recent Labs    11/08/17 0544 11/09/17 0552  NA 142 140  K 3.6 3.7  CL 103 105  CO2 28 26  GLUCOSE 118* 101*  BUN 11 7  CREATININE 0.89 0.83  CALCIUM 8.6* 8.3*   LFT Hepatic Function Latest Ref Rng & Units 11/06/2017 10/16/2017 10/23/2016  Total Protein 6.5 - 8.1 g/dL 7.6 7.2 7.3  Albumin 3.5 - 5.0 g/dL 4.2 4.1 4.1  AST 15 - 41 U/L '22 22 20  '$ ALT 14 - 54 U/L '16 18 20  '$ Alk Phosphatase 38 - 126 U/L 71 65 58  Total Bilirubin 0.3 - 1.2 mg/dL 0.9 0.5 0.6   PT/INR No results for input(s): LABPROT, INR in the last 72 hours. ABG No results for input(s): PHART, HCO3 in the last 72 hours.  Invalid input(s): PCO2, PO2  Studies/Results:  Anti-infectives: Anti-infectives (From admission, onward)   None      Medications: Scheduled Meds: . bisacodyl  10 mg Rectal Once  . enoxaparin (LOVENOX) injection  40 mg Subcutaneous Q24H  . latanoprost  1 drop  Right Eye QHS  . levothyroxine  37.5 mcg Intravenous Daily  . pantoprazole (PROTONIX) IV  40 mg Intravenous Q24H   Continuous Infusions: . dextrose 5% lactated ringers 75 mL/hr at 11/09/17 0454   PRN Meds:.acetaminophen **OR** acetaminophen, hydrALAZINE, ketorolac, morphine injection, ondansetron **OR** ondansetron (ZOFRAN) IV, phenol  Assessment/Plan: Patient Active Problem List   Diagnosis Date Noted  . SBO (small bowel obstruction) ([place]) 11/06/2017  . Small bowel obstruction due to adhesions ([place]) 10/28/2016  . Small bowel obstruction s/p ex lap & lysis of adhesions 10/28/2016 10/23/2016  . Abdominal mass 10/23/2016  . Hypokalemia 10/23/2016  . Murmur 09/26/2015  . Chest pain 09/26/2015  . HTN (hypertension) 09/26/2015  . Hypothyroidism 09/26/2015   SBO  No fever, no tachycardia, no wbc. Contrast from CT in rt colon. Some small amount of SB distension. Xray unchanged.   NG tube output down Will clamp NG and see how does. Can have ice chips/sips of water  NEEDS to MOVE. Discussed with pt and nurse  Ambulate, pulm toilet Cont chemical VTE prophylaxis   Disposition:  LOS: 3 days    Leighton Ruff. Redmond Pulling, MD, FACS General, Bariatric, & Minimally Invasive Surgery [phone] [facility]

## 2017-11-10 LAB — BASIC METABOLIC PANEL
Anion gap: 9 (ref 5–15)
BUN: 6 mg/dL (ref 6–20)
CHLORIDE: 108 mmol/L (ref 101–111)
CO2: 26 mmol/L (ref 22–32)
CREATININE: 0.79 mg/dL (ref 0.44–1.00)
Calcium: 8.8 mg/dL — ABNORMAL LOW (ref 8.9–10.3)
GFR calc non Af Amer: >60 mL/min (ref >60)
Glucose, Bld: 103 mg/dL — ABNORMAL HIGH (ref 65–99)
Potassium: 3.8 mmol/L (ref 3.5–5.1)
Sodium: 143 mmol/L (ref 135–145)

## 2017-11-10 NOTE — Progress Notes (Addendum)
[facility] Progress Note     Subjective: CC: SBO Patient without abdominal pain. Tolerated NGT clamping, denies n/v. No flatus, no BM. Patient reports some hematuria last night. Distention is improving some. Patient walked 3x yesterday.  UOP good. VSS.   Objective: Vital signs in last 24 hours: Temp:  [98.5 F (36.9 C)-99.1 F (37.3 C)] 99.1 F (37.3 C) (02/25 0611) Pulse Rate:  [59-67] 63 (02/25 0611) Resp:  [14-17] 16 (02/25 0611) BP: (139-163)/(69-73) 163/73 (02/25 0611) SpO2:  [99 %-100 %] 99 % (02/25 0611) Last BM Date: 11/06/17  Intake/Output from previous day: 02/24 0701 - 02/25 0700 In: 1740 [I.V.:1740] Out: -  Intake/Output this shift: No intake/output data recorded.  PE: Gen:  Alert, NAD, pleasant Card:  Regular rate and rhythm, pedal pulses 2+ BL Pulm:  Normal effort, clear to auscultation bilaterally Abd: Soft, non-tender, mildly distended, bowel sounds present, no HSM Skin: warm and dry, no rashes  Psych: A&Ox3   Lab Results:  Recent Labs    11/08/17 0544 11/09/17 0552  WBC 5.5 4.0  HGB 12.0 11.5*  HCT 37.1 36.1  PLT 381 347   BMET Recent Labs    11/08/17 0544 11/09/17 0552  NA 142 140  K 3.6 3.7  CL 103 105  CO2 28 26  GLUCOSE 118* 101*  BUN 11 7  CREATININE 0.89 0.83  CALCIUM 8.6* 8.3*   PT/INR No results for input(s): LABPROT, INR in the last 72 hours. CMP     Component Value Date/Time   NA 140 11/09/2017 0552   K 3.7 11/09/2017 0552   CL 105 11/09/2017 0552   CO2 26 11/09/2017 0552   GLUCOSE 101 (H) 11/09/2017 0552   BUN 7 11/09/2017 0552   CREATININE 0.83 11/09/2017 0552   CREATININE 0.83 09/26/2015 0955   CALCIUM 8.3 (L) 11/09/2017 0552   PROT 7.6 11/06/2017 1708   ALBUMIN 4.2 11/06/2017 1708   AST 22 11/06/2017 1708   ALT 16 11/06/2017 1708   ALKPHOS 71 11/06/2017 1708   BILITOT 0.9 11/06/2017 1708   GFRNONAA >60 11/09/2017 0552   GFRAA >60 11/09/2017 0552   Lipase     Component Value Date/Time   LIPASE 24 11/06/2017 1708       Studies/Results: Dg Abd Portable 1v  Result Date: 11/09/2017 CLINICAL DATA:  Abdominal pain EXAM: PORTABLE ABDOMEN - 1 VIEW COMPARISON:  T 12/03/2017 FINDINGS: Nasogastric tube tip remains in the gastric antrum region. Previously administered contrast remains within the right colon. Mild prominence of the small bowel gas pattern which could go along with mild ileus or partial small bowel obstruction. Not definitely abnormal however. IMPRESSION: Nasogastric tube tip in the gastric antrum. No progression of the hyperdense material in the right colon. Slight prominence of the proximal small bowel which could be within normal limits or mildly abnormal as above. Electronically Signed   By: Paulina Fusi M.D.   On: 11/09/2017 06:28    Anti-infectives: Anti-infectives (From admission, onward)   None       Assessment/Plan Hypothyroidism - IV synthroid until tolerating PO Hx of SBO - s/p LOA 10/28/16  SBO - NGT clamped - mild distention but belly soft and good BS - trial of CLD around NGT, if patient tolerates can d/c NGT this afternoon  - encourage OOB and ambulation  - labs pending - BMET okay  FEN: CLD, IVF; NGT clamped VTE: SCDs, lovenox ID: no abx indicated   LOS: 4 days    [employer] ,  [facility] 11/10/2017, 8:06 AM Pager: [phone] Consults: [phone]  Agree with above.  Tolerated clear liquids around NGT.  Has passed some flatus today.  Abdomen is soft and benign.  ] Will try NGT out.  She needs to get up and walk as much as she can.  Ovidio Kinavid Korryn Pancoast, MD, [facility] Pager: [phone] Office phone:  [phone]

## 2017-11-10 NOTE — Progress Notes (Signed)
Patient received into room 1525. OOB chair taking full liquid dinner. Lina SarBeth Shahzad Thomann, RN

## 2017-11-10 NOTE — Progress Notes (Signed)
PROGRESS NOTE [facility]   KRISTYNA BRADSTREET   WUJ:[id number] DOB: [birth year]  DOA: 11/06/2017 PCP: Darrow Bussing, MD   Brief Narrative:  Debbie Brandt is a 57 year old female with past medical history of small bowel obstruction status post lysis of adhesions in February 2018.  Presented to the emergency department complaining of abdominal pain.  Upon ED evaluation CT of the abdomen suggested possible internal hernia with small bowel obstruction.  General surgery was consulted and patient was started on small bowel protocol.  NG tube was placed, patient was n.p.o. and GI series was performed.  Subjective: Patient seen and examined, she has passed some flatus.  Tolerating liquid diet today.  Denies abdominal pain.  NG tube was clamped and planning to be removed per general surgery.  Assessment & Plan: Small bowel obstruction NG tube clamped if tolerated can be removed today per general surgery Continue IV fluids and supportive treatment General surgery recommendations appreciated  Hypokalemia This has resolved Continue to monitor for now  Hypothyroidism Stable Can switch Synthroid to oral formulation now the patient is tolerating diet.   DVT prophylaxis: Lovenox Code Status: Full code Family Communication: Family at bedside Disposition Plan: Home when medically stable  Consultants:   General surgery  Procedures:   None  Antimicrobials:  None   Objective: Vitals:   11/09/17 1455 11/09/17 2127 11/10/17 0611 11/10/17 1407  BP: 139/70 (!) 162/69 (!) 163/73 (!) 164/66  Pulse: 67 (!) 59 63 62  Resp: 14 17 16 15   Temp: 98.5 F (36.9 C) 98.5 F (36.9 C) 99.1 F (37.3 C) 98.2 F (36.8 C)  TempSrc: Oral Oral Oral Oral  SpO2: 100% 100% 99% 100%  Weight:      Height:        Intake/Output Summary (Last 24 hours) at 11/10/2017 1818 Last data filed at 11/10/2017 1408 Gross per 24 hour  Intake 1260 ml  Output -  Net 1260 ml   Filed Weights   11/07/17 0054   Weight: 75.2 kg (165 lb 12.6 oz)    Examination:  General exam: Appears calm and comfortable  HEENT: OP moist and clear, NG tube in place Respiratory system: Clear to auscultation. No wheezes,crackle or rhonchi Cardiovascular system: S1 & S2 heard, RRR. No JVD, murmurs, rubs or gallops Gastrointestinal system: Abdomen is nondistended, soft and nontender. No organomegaly or masses felt. Normal bowel sounds heard. Central nervous system: Alert and oriented. No focal neurological deficits. Extremities: No lower extremity edema Skin: No rashes Psychiatry: Judgement and insight appear normal.   Data Reviewed: I have personally reviewed following labs and imaging studies  CBC: Recent Labs  Lab 11/06/17 1708 11/07/17 0530 11/08/17 0544 11/09/17 0552  WBC 6.8 6.6 5.5 4.0  NEUTROABS  --   --  3.6 2.3  HGB 13.6 13.1 12.0 11.5*  HCT 41.8 39.9 37.1 36.1  MCV 85.3 85.6 87.1 87.4  PLT 437* 428* 381 347   Basic Metabolic Panel: Recent Labs  Lab 11/06/17 1708 11/07/17 0530 11/08/17 0544 11/09/17 0552 11/10/17 0922  NA 138 139 142 140 143  K 3.5 3.3* 3.6 3.7 3.8  CL 99* 102 103 105 108  CO2 28 26 28 26 26   GLUCOSE 111* 109* 118* 101* 103*  BUN 12 12 11 7 6   CREATININE 0.94 0.81 0.89 0.83 0.79  CALCIUM 9.6 8.8* 8.6* 8.3* 8.8*  MG  --   --  2.0  --   --    GFR: Estimated Creatinine Clearance: 72.7 mL/min (by  C-G formula based on SCr of 0.79 mg/dL). Liver Function Tests: Recent Labs  Lab 11/06/17 1708  AST 22  ALT 16  ALKPHOS 71  BILITOT 0.9  PROT 7.6  ALBUMIN 4.2   Recent Labs  Lab 11/06/17 1708  LIPASE 24   No results for input(s): AMMONIA in the last 168 hours. Coagulation Profile: No results for input(s): INR, PROTIME in the last 168 hours. Cardiac Enzymes: No results for input(s): CKTOTAL, CKMB, CKMBINDEX, TROPONINI in the last 168 hours. BNP (last 3 results) No results for input(s): PROBNP in the last 8760 hours. HbA1C: No results for input(s): HGBA1C  in the last 72 hours. CBG: No results for input(s): GLUCAP in the last 168 hours. Lipid Profile: No results for input(s): CHOL, HDL, LDLCALC, TRIG, CHOLHDL, LDLDIRECT in the last 72 hours. Thyroid Function Tests: No results for input(s): TSH, T4TOTAL, FREET4, T3FREE, THYROIDAB in the last 72 hours. Anemia Panel: No results for input(s): VITAMINB12, FOLATE, FERRITIN, TIBC, IRON, RETICCTPCT in the last 72 hours. Sepsis Labs: No results for input(s): PROCALCITON, LATICACIDVEN in the last 168 hours.  No results found for this or any previous visit (from the past 240 hour(s)).    Radiology Studies: Dg Abd Portable 1v  Result Date: 11/09/2017 CLINICAL DATA:  Abdominal pain EXAM: PORTABLE ABDOMEN - 1 VIEW COMPARISON:  T 12/03/2017 FINDINGS: Nasogastric tube tip remains in the gastric antrum region. Previously administered contrast remains within the right colon. Mild prominence of the small bowel gas pattern which could go along with mild ileus or partial small bowel obstruction. Not definitely abnormal however. IMPRESSION: Nasogastric tube tip in the gastric antrum. No progression of the hyperdense material in the right colon. Slight prominence of the proximal small bowel which could be within normal limits or mildly abnormal as above. Electronically Signed   By: Paulina FusiMark  Shogry M.D.   On: 11/09/2017 06:28      Scheduled Meds: . enoxaparin (LOVENOX) injection  40 mg Subcutaneous Q24H  . latanoprost  1 drop Right Eye QHS  . levothyroxine  37.5 mcg Intravenous Daily  . pantoprazole (PROTONIX) IV  40 mg Intravenous Q24H   Continuous Infusions: . dextrose 5% lactated ringers 75 mL/hr at 11/10/17 0931     LOS: 4 days    Time spent: Total of 15 minutes spent with pt, greater than 50% of which was spent in discussion of  treatment, counseling and coordination of care    Latrelle DodrillEdwin Silva, MD Pager: Text Page via [email or web address]   If 7PM-7AM, please contact night-coverage [email or web address] 11/10/2017,  6:18 PM   Note - This record has been created using [employer] software. Chart creation errors have been sought, but may not always have been located. Such creation errors do not reflect on the standard of medical care.

## 2017-11-10 NOTE — Plan of Care (Signed)
Plan of care reviewed. 

## 2017-11-11 ENCOUNTER — Inpatient Hospital Stay (HOSPITAL_COMMUNITY): Payer: Managed Care, Other (non HMO)

## 2017-11-11 LAB — BASIC METABOLIC PANEL
ANION GAP: 8 (ref 5–15)
BUN: 5 mg/dL — ABNORMAL LOW (ref 6–20)
CALCIUM: 8.8 mg/dL — AB (ref 8.9–10.3)
CO2: 25 mmol/L (ref 22–32)
Chloride: 109 mmol/L (ref 101–111)
Creatinine, Ser: 0.81 mg/dL (ref 0.44–1.00)
GFR calc Af Amer: >60 mL/min (ref >60)
GFR calc non Af Amer: >60 mL/min (ref >60)
GLUCOSE: 111 mg/dL — AB (ref 65–99)
POTASSIUM: 3.6 mmol/L (ref 3.5–5.1)
Sodium: 142 mmol/L (ref 135–145)

## 2017-11-11 MED ORDER — LEVOTHYROXINE SODIUM 75 MCG PO TABS
75.0000 ug | ORAL_TABLET | Freq: Every day | ORAL | Status: DC
  Administered 2017-11-12 – 2017-11-14 (×3): 75 ug via ORAL
  Filled 2017-11-11 (×3): qty 1

## 2017-11-11 MED ORDER — LOSARTAN POTASSIUM 25 MG PO TABS
12.5000 mg | ORAL_TABLET | Freq: Every day | ORAL | Status: DC
  Administered 2017-11-12 – 2017-11-14 (×3): 12.5 mg via ORAL
  Filled 2017-11-11 (×4): qty 1

## 2017-11-11 NOTE — Progress Notes (Signed)
PROGRESS NOTE [facility]   Debbie Brandt   NWG:[id number] DOB: [birth year]  DOA: 11/06/2017 PCP: Darrow Bussing, MD   Brief Narrative:  Debbie Brandt is a 57 year old female with past medical history of small bowel obstruction status post lysis of adhesions in February 2018.  Presented to the emergency department complaining of abdominal pain.  Upon ED evaluation CT of the abdomen suggested possible internal hernia with small bowel obstruction.  General surgery was consulted and patient was started on small bowel protocol.  NG tube was placed, patient was n.p.o. and GI series was performed.  NG tube has been removed, she she has been advanced to clear liquids.  Subjective: Patient seen and examined, complaining of abdominal pain, nausea.  Has not passed flatus or BM this morning. Feels her belly is more distended.  Assessment & Plan: Small bowel obstruction NGT removed last night, belly more distended today Repeat abd xray, placed on NPO but gen surgery recommended to continue clears.  Encourage ambulation   Hypokalemia This has resolved Continue to monitor for now  Hypothyroidism Stable Continue home dose synthroid   DVT prophylaxis: Lovenox Code Status: Full code Family Communication: Family at bedside Disposition Plan: Home when cleared by surgery   Consultants:   General surgery  Procedures:   None  Antimicrobials:  None   Objective: Vitals:   11/10/17 2134 11/11/17 0056 11/11/17 0439 11/11/17 1216  BP: (!) 173/66 (!) 146/58 139/63 (!) 162/70  Pulse: 65  72 (!) 59  Resp: 18  18 18   Temp: 97.9 F (36.6 C)  98.9 F (37.2 C) 98.4 F (36.9 C)  TempSrc: Oral  Oral Oral  SpO2: 100%  98% 100%  Weight:      Height:        Intake/Output Summary (Last 24 hours) at 11/11/2017 1433 Last data filed at 11/11/2017 0800 Gross per 24 hour  Intake 60 ml  Output -  Net 60 ml   Filed Weights   11/07/17 0054  Weight: 75.2 kg (165 lb 12.6 oz)     Examination: General: Mild distress  Cardiovascular: RRR, S1/S2 +, no rubs, no gallops Respiratory: CTA bilaterally, no wheezing, no rhonchi Abdominal: Mild distention, soft, mild epigastric and RLQ tenderness. + BS.  Extremities: no edema  Data Reviewed: I have personally reviewed following labs and imaging studies  CBC: Recent Labs  Lab 11/06/17 1708 11/07/17 0530 11/08/17 0544 11/09/17 0552  WBC 6.8 6.6 5.5 4.0  NEUTROABS  --   --  3.6 2.3  HGB 13.6 13.1 12.0 11.5*  HCT 41.8 39.9 37.1 36.1  MCV 85.3 85.6 87.1 87.4  PLT 437* 428* 381 347   Basic Metabolic Panel: Recent Labs  Lab 11/07/17 0530 11/08/17 0544 11/09/17 0552 11/10/17 0922 11/11/17 0426  NA 139 142 140 143 142  K 3.3* 3.6 3.7 3.8 3.6  CL 102 103 105 108 109  CO2 26 28 26 26 25   GLUCOSE 109* 118* 101* 103* 111*  BUN 12 11 7 6  5*  CREATININE 0.81 0.89 0.83 0.79 0.81  CALCIUM 8.8* 8.6* 8.3* 8.8* 8.8*  MG  --  2.0  --   --   --    GFR: Estimated Creatinine Clearance: 71.8 mL/min (by C-G formula based on SCr of 0.81 mg/dL). Liver Function Tests: Recent Labs  Lab 11/06/17 1708  AST 22  ALT 16  ALKPHOS 71  BILITOT 0.9  PROT 7.6  ALBUMIN 4.2   Recent Labs  Lab 11/06/17 1708  LIPASE 24  No results for input(s): AMMONIA in the last 168 hours. Coagulation Profile: No results for input(s): INR, PROTIME in the last 168 hours. Cardiac Enzymes: No results for input(s): CKTOTAL, CKMB, CKMBINDEX, TROPONINI in the last 168 hours. BNP (last 3 results) No results for input(s): PROBNP in the last 8760 hours. HbA1C: No results for input(s): HGBA1C in the last 72 hours. CBG: No results for input(s): GLUCAP in the last 168 hours. Lipid Profile: No results for input(s): CHOL, HDL, LDLCALC, TRIG, CHOLHDL, LDLDIRECT in the last 72 hours. Thyroid Function Tests: No results for input(s): TSH, T4TOTAL, FREET4, T3FREE, THYROIDAB in the last 72 hours. Anemia Panel: No results for input(s): VITAMINB12,  FOLATE, FERRITIN, TIBC, IRON, RETICCTPCT in the last 72 hours. Sepsis Labs: No results for input(s): PROCALCITON, LATICACIDVEN in the last 168 hours.  No results found for this or any previous visit (from the past 240 hour(s)).    Radiology Studies: Dg Abd 2 Views  Result Date: 11/11/2017 CLINICAL DATA:  Abdominal distention.  Constipation. EXAM: ABDOMEN - 2 VIEW COMPARISON:  11/09/2017.  CT 10/16/2017. FINDINGS: Interval removal of NG tube. Distended loops of small bowel are noted. Similar findings noted on prior exam. Oral contrast in the colon. These findings suggest partial small bowel obstruction. No free air. Pelvic calcifications consistent phleboliths. No acute bony abnormality. IMPRESSION: 1.  Interim removal of NG tube. 2. Persistent distended loops of small bowel. Similar findings noted on prior exam. Oral contrast in the colon. These findings are consistent with partial small bowel obstruction. Continued follow-up exams to demonstrate resolution suggested. Electronically Signed   By: Maisie Fushomas  Register   On: 11/11/2017 11:58     Scheduled Meds: . enoxaparin (LOVENOX) injection  40 mg Subcutaneous Q24H  . latanoprost  1 drop Right Eye QHS  . levothyroxine  37.5 mcg Intravenous Daily  . pantoprazole (PROTONIX) IV  40 mg Intravenous Q24H   Continuous Infusions:    LOS: 5 days    Time spent: Total of 15 minutes spent with pt, greater than 50% of which was spent in discussion of  treatment, counseling and coordination of care   Latrelle DodrillEdwin Silva, MD Pager: Text Page via [email or web address]   If 7PM-7AM, please contact night-coverage [email or web address] 11/11/2017, 2:33 PM   Note - This record has been created using [employer] software. Chart creation errors have been sought, but may not always have been located. Such creation errors do not reflect on the standard of medical care.

## 2017-11-11 NOTE — Progress Notes (Addendum)
    CC:  SBO  Subjective: She felt really bad this AM, feels better now.  Film shows slow movement of contrast int he colon.    Objective: Vital signs in last 24 hours: Temp:  [97.9 F (36.6 C)-98.9 F (37.2 C)] 98.9 F (37.2 C) (02/26 0439) Pulse Rate:  [62-72] 72 (02/26 0439) Resp:  [15-18] 18 (02/26 0439) BP: (139-173)/(58-66) 139/63 (02/26 0439) SpO2:  [98 %-100 %] 98 % (02/26 0439) Last BM Date: 11/09/17 120 PO  Voided x 3 BM x 1 recorded Afebrile, VSS, BP up some K+ 3.6 film  Intake/Output from previous day: 02/25 0701 - 02/26 0700 In: 120 [P.O.:120] Out: -  Intake/Output this shift: No intake/output data recorded.  General appearance: alert, cooperative and no distress GI: soft few BS, not much flatus, no BM   Lab Results:  Recent Labs    11/09/17 0552  WBC 4.0  HGB 11.5*  HCT 36.1  PLT 347    BMET Recent Labs    11/10/17 0922 11/11/17 0426  NA 143 142  K 3.8 3.6  CL 108 109  CO2 26 25  GLUCOSE 103* 111*  BUN 6 5*  CREATININE 0.79 0.81  CALCIUM 8.8* 8.8*   PT/INR No results for input(s): LABPROT, INR in the last 72 hours.  Recent Labs  Lab 11/06/17 1708  AST 22  ALT 16  ALKPHOS 71  BILITOT 0.9  PROT 7.6  ALBUMIN 4.2     Lipase     Component Value Date/Time   LIPASE 24 11/06/2017 1708     Medications: . enoxaparin (LOVENOX) injection  40 mg Subcutaneous Q24H  . latanoprost  1 drop Right Eye QHS  . levothyroxine  37.5 mcg Intravenous Daily  . pantoprazole (PROTONIX) IV  40 mg Intravenous Q24H    Assessment/Plan Hypothyroidism - IV synthroid until tolerating PO Hx of SBO - s/p LOA 10/28/16  SBO - NGT out - mild distention but belly soft and good BS - encourage OOB and ambulation  - labs pending -   FEN: CLD, IVF; NGT clamped VTE: SCDs, lovenox ID: no abx indicated   Plan:  Clears, we told her to go slow and to continue walking.      LOS: 5 days    JENNINGS,WILLARD 11/11/2017 [phone]  Agree with  above.  She did not feel as good this AM - but feels better now. KUB shows contrast in colon moved to the transverse colon.  She still has some dilated SB. Her abdominal exam is benign.  Ovidio Kinavid Javar Eshbach, MD, [facility] Pager: [phone] Office phone:  [phone]

## 2017-11-12 DIAGNOSIS — K56609 Unspecified intestinal obstruction, unspecified as to partial versus complete obstruction: Secondary | ICD-10-CM

## 2017-11-12 DIAGNOSIS — E039 Hypothyroidism, unspecified: Secondary | ICD-10-CM

## 2017-11-12 MED ORDER — BISACODYL 10 MG RE SUPP
10.0000 mg | Freq: Once | RECTAL | Status: AC
  Administered 2017-11-12: 10 mg via RECTAL
  Filled 2017-11-12: qty 1

## 2017-11-12 MED ORDER — LOSARTAN POTASSIUM 25 MG PO TABS
12.5000 mg | ORAL_TABLET | Freq: Once | ORAL | Status: AC
  Administered 2017-11-12: 12.5 mg via ORAL

## 2017-11-12 MED ORDER — DOCUSATE SODIUM 100 MG PO CAPS
100.0000 mg | ORAL_CAPSULE | Freq: Two times a day (BID) | ORAL | Status: DC
  Administered 2017-11-12 – 2017-11-14 (×5): 100 mg via ORAL
  Filled 2017-11-12 (×5): qty 1

## 2017-11-12 MED ORDER — POTASSIUM CHLORIDE CRYS ER 20 MEQ PO TBCR
20.0000 meq | EXTENDED_RELEASE_TABLET | Freq: Two times a day (BID) | ORAL | Status: AC
  Administered 2017-11-12 (×2): 20 meq via ORAL
  Filled 2017-11-12 (×2): qty 1

## 2017-11-12 MED ORDER — PANTOPRAZOLE SODIUM 40 MG PO TBEC
40.0000 mg | DELAYED_RELEASE_TABLET | Freq: Every day | ORAL | Status: DC
  Administered 2017-11-12 – 2017-11-14 (×3): 40 mg via ORAL
  Filled 2017-11-12 (×3): qty 1

## 2017-11-12 NOTE — Progress Notes (Signed)
PROGRESS NOTE    Debbie FlemingsCynthia L Vankuren  UJW:[id number] DOB: 1961/02/13 DOA: 11/06/2017 PCP: Darrow BussingKoirala, Dibas, MD    Brief Narrative: Debbie Brandt is a 57 year old female with past medical history of small bowel obstruction status post lysis of adhesions in February 2018.  Presented to the emergency department complaining of abdominal pain.  Upon ED evaluation CT of the abdomen suggested possible internal hernia with small bowel obstruction.  General surgery was consulted and patient was started on small bowel protocol.  NG tube was placed, patient was n.p.o. and GI series was performed.  NG tube has been removed, she she has been advanced to clear liquids, she is able to tolerate liquid diet.     Assessment & Plan:   Principal Problem:   SBO (small bowel obstruction) (HCC) Active Problems:   Hypothyroidism   Small bowel obstruction.  NG tube removed.  Repeat abd x ray show improvement.  Started on clears and improving.  Surgery on board and improving.    Hypothyroidism:  On synthroid.    Hypokalemia:  Replaced.     DVT prophylaxis: lovenox.  Code Status:  Full code.  Family Communication: none at bedside.  Disposition Plan: pending resolution of sbo.    Consultants:   Surgery.   Procedures: NG TUBE.   Antimicrobials: none.   Subjective: None.   Objective: Vitals:   11/11/17 2134 11/12/17 0043 11/12/17 0139 11/12/17 0526  BP: (!) 176/73 (!) 140/52 (!) 123/52 130/83  Pulse: 67 61 63 (!) 59  Resp: 17  17 17   Temp: 98.9 F (37.2 C)   98.2 F (36.8 C)  TempSrc: Oral   Oral  SpO2: 100%  100% 100%  Weight:      Height:        Intake/Output Summary (Last 24 hours) at 11/12/2017 1758 Last data filed at 11/12/2017 [id number] Gross per 24 hour  Intake 0 ml  Output -  Net 0 ml   Filed Weights   11/07/17 0054  Weight: 75.2 kg (165 lb 12.6 oz)    Examination:  General exam: Appears calm and comfortable  Respiratory system: Clear to auscultation. Respiratory  effort normal. Cardiovascular system: S1 & S2 heard, RRR. No JVD, murmurs, rubs, gallops or clicks. No pedal edema. Gastrointestinal system: Abdomen is distended, soft and nontender. No organomegaly or masses felt. Normal bowel sounds heard. Central nervous system: Alert and oriented. No focal neurological deficits. Extremities: Symmetric 5 x 5 power. Skin: No rashes, lesions or ulcers Psychiatry: Judgement and insight appear normal. Mood & affect appropriate.     Data Reviewed: I have personally reviewed following labs and imaging studies  CBC: Recent Labs  Lab 11/06/17 1708 11/07/17 0530 11/08/17 0544 11/09/17 0552  WBC 6.8 6.6 5.5 4.0  NEUTROABS  --   --  3.6 2.3  HGB 13.6 13.1 12.0 11.5*  HCT 41.8 39.9 37.1 36.1  MCV 85.3 85.6 87.1 87.4  PLT 437* 428* 381 347   Basic Metabolic Panel: Recent Labs  Lab 11/07/17 0530 11/08/17 0544 11/09/17 0552 11/10/17 0922 11/11/17 0426  NA 139 142 140 143 142  K 3.3* 3.6 3.7 3.8 3.6  CL 102 103 105 108 109  CO2 26 28 26 26 25   GLUCOSE 109* 118* 101* 103* 111*  BUN 12 11 7 6  5*  CREATININE 0.81 0.89 0.83 0.79 0.81  CALCIUM 8.8* 8.6* 8.3* 8.8* 8.8*  MG  --  2.0  --   --   --    GFR: Estimated Creatinine  Clearance: 71.8 mL/min (by C-G formula based on SCr of 0.81 mg/dL). Liver Function Tests: Recent Labs  Lab 11/06/17 1708  AST 22  ALT 16  ALKPHOS 71  BILITOT 0.9  PROT 7.6  ALBUMIN 4.2   Recent Labs  Lab 11/06/17 1708  LIPASE 24   No results for input(s): AMMONIA in the last 168 hours. Coagulation Profile: No results for input(s): INR, PROTIME in the last 168 hours. Cardiac Enzymes: No results for input(s): CKTOTAL, CKMB, CKMBINDEX, TROPONINI in the last 168 hours. BNP (last 3 results) No results for input(s): PROBNP in the last 8760 hours. HbA1C: No results for input(s): HGBA1C in the last 72 hours. CBG: No results for input(s): GLUCAP in the last 168 hours. Lipid Profile: No results for input(s): CHOL, HDL,  LDLCALC, TRIG, CHOLHDL, LDLDIRECT in the last 72 hours. Thyroid Function Tests: No results for input(s): TSH, T4TOTAL, FREET4, T3FREE, THYROIDAB in the last 72 hours. Anemia Panel: No results for input(s): VITAMINB12, FOLATE, FERRITIN, TIBC, IRON, RETICCTPCT in the last 72 hours. Sepsis Labs: No results for input(s): PROCALCITON, LATICACIDVEN in the last 168 hours.  No results found for this or any previous visit (from the past 240 hour(s)).       Radiology Studies: Dg Abd 2 Views  Result Date: 11/11/2017 CLINICAL DATA:  Abdominal distention.  Constipation. EXAM: ABDOMEN - 2 VIEW COMPARISON:  11/09/2017.  CT 10/16/2017. FINDINGS: Interval removal of NG tube. Distended loops of small bowel are noted. Similar findings noted on prior exam. Oral contrast in the colon. These findings suggest partial small bowel obstruction. No free air. Pelvic calcifications consistent phleboliths. No acute bony abnormality. IMPRESSION: 1.  Interim removal of NG tube. 2. Persistent distended loops of small bowel. Similar findings noted on prior exam. Oral contrast in the colon. These findings are consistent with partial small bowel obstruction. Continued follow-up exams to demonstrate resolution suggested. Electronically Signed   By: Maisie Fus  Register   On: 11/11/2017 11:58        Scheduled Meds: . docusate sodium  100 mg Oral BID  . enoxaparin (LOVENOX) injection  40 mg Subcutaneous Q24H  . latanoprost  1 drop Right Eye QHS  . levothyroxine  75 mcg Oral QAC breakfast  . losartan  12.5 mg Oral Daily  . pantoprazole  40 mg Oral Daily  . potassium chloride  20 mEq Oral BID   Continuous Infusions:   LOS: 6 days    Time spent: 35 MINUTES.     Kathlen Mody, MD [facility] Pager [phone]  If 7PM-7AM, please contact night-coverage [email or web address] Password [facility] 11/12/2017, 5:58 PM

## 2017-11-12 NOTE — Progress Notes (Signed)
Patient was SL. Loss of IV site. Paged Dr Magnus IvanBlackman about BP. Home medication started and ordered daily.

## 2017-11-12 NOTE — Progress Notes (Addendum)
[facility] Progress Note     Subjective: CC: sbo Patient denies abdominal pain, n/v. Belching today, but passing some flatus. Walked several times yesterday. Tolerating CLD VSS.   Objective: Vital signs in last 24 hours: Temp:  [98.2 F (36.8 C)-98.9 F (37.2 C)] 98.2 F (36.8 C) (02/27 0526) Pulse Rate:  [59-67] 59 (02/27 0526) Resp:  [17-18] 17 (02/27 0526) BP: (123-176)/(52-83) 130/83 (02/27 0526) SpO2:  [100 %] 100 % (02/27 0526) Last BM Date: 11/06/17  Intake/Output from previous day: 02/26 0701 - 02/27 0700 In: 60 [P.O.:60] Out: -  Intake/Output this shift: No intake/output data recorded.  PE: Gen:  Alert, NAD, pleasant Card:  Regular rate and rhythm, pedal pulses 2+ BL Pulm:  Normal effort, clear to auscultation bilaterally Abd: Soft, non-tender, mildly distended, bowel sounds present, no HSM Skin: warm and dry, no rashes  Psych: A&Ox3   Lab Results:  No results for input(s): WBC, HGB, HCT, PLT in the last 72 hours. BMET Recent Labs    11/10/17 0922 11/11/17 0426  NA 143 142  K 3.8 3.6  CL 108 109  CO2 26 25  GLUCOSE 103* 111*  BUN 6 5*  CREATININE 0.79 0.81  CALCIUM 8.8* 8.8*   PT/INR No results for input(s): LABPROT, INR in the last 72 hours. CMP     Component Value Date/Time   NA 142 11/11/2017 0426   K 3.6 11/11/2017 0426   CL 109 11/11/2017 0426   CO2 25 11/11/2017 0426   GLUCOSE 111 (H) 11/11/2017 0426   BUN 5 (L) 11/11/2017 0426   CREATININE 0.81 11/11/2017 0426   CREATININE 0.83 09/26/2015 0955   CALCIUM 8.8 (L) 11/11/2017 0426   PROT 7.6 11/06/2017 1708   ALBUMIN 4.2 11/06/2017 1708   AST 22 11/06/2017 1708   ALT 16 11/06/2017 1708   ALKPHOS 71 11/06/2017 1708   BILITOT 0.9 11/06/2017 1708   GFRNONAA >60 11/11/2017 0426   GFRAA >60 11/11/2017 0426   Lipase     Component Value Date/Time   LIPASE 24 11/06/2017 1708       Studies/Results: Dg Abd 2 Views  Result Date: 11/11/2017 CLINICAL DATA:  Abdominal  distention.  Constipation. EXAM: ABDOMEN - 2 VIEW COMPARISON:  11/09/2017.  CT 10/16/2017. FINDINGS: Interval removal of NG tube. Distended loops of small bowel are noted. Similar findings noted on prior exam. Oral contrast in the colon. These findings suggest partial small bowel obstruction. No free air. Pelvic calcifications consistent phleboliths. No acute bony abnormality. IMPRESSION: 1.  Interim removal of NG tube. 2. Persistent distended loops of small bowel. Similar findings noted on prior exam. Oral contrast in the colon. These findings are consistent with partial small bowel obstruction. Continued follow-up exams to demonstrate resolution suggested. Electronically Signed   By: Maisie Fus  Register   On: 11/11/2017 11:58    Anti-infectives: Anti-infectives (From admission, onward)   None       Assessment/Plan Hypothyroidism - IV synthroid until tolerating PO Hx of SBO - s/p LOA 10/28/16  SBO - NGTout - mild distention but belly soft and good BS, tolerating CLD - encourage OOB and ambulation  - KUB yesterday showed contrast moving slowly through colon - repeat tomorrow   FEN:CLD; added colace and dulcolax suppository VTE: SCDs, lovenox ID: no abx indicated  Plan:  Clears, continue walking.  repeat KUB and labs in AM. If patient not progressing, will have to consider operative intervention    LOS: 6 days    Wells Guiles ,  [facility] 11/12/2017, 10:15 AM Pager: [phone] Consults: [phone]  Agree with above. Burping, but passing flatus and had a small BM.  Her abdomen is benign.  Ovidio Kinavid Kennan Detter, MD, [facility] Pager: [phone] Office phone:  [phone]

## 2017-11-13 ENCOUNTER — Inpatient Hospital Stay (HOSPITAL_COMMUNITY): Payer: Managed Care, Other (non HMO)

## 2017-11-13 LAB — BASIC METABOLIC PANEL
ANION GAP: 6 (ref 5–15)
BUN: 6 mg/dL (ref 6–20)
CALCIUM: 8.6 mg/dL — AB (ref 8.9–10.3)
CO2: 25 mmol/L (ref 22–32)
Chloride: 107 mmol/L (ref 101–111)
Creatinine, Ser: 0.77 mg/dL (ref 0.44–1.00)
Glucose, Bld: 82 mg/dL (ref 65–99)
Potassium: 4 mmol/L (ref 3.5–5.1)
SODIUM: 138 mmol/L (ref 135–145)

## 2017-11-13 MED ORDER — BISACODYL 10 MG RE SUPP: 10.0000 mg | Freq: Every day | RECTAL | Status: DC | PRN

## 2017-11-13 NOTE — Progress Notes (Addendum)
[facility] Progress Note     Subjective: CC: SBO Patient with a large BM overnight after suppository. Passing flatus. Denies nausea. Tolerating CLD.  UOP good. VSS.   Objective: Vital signs in last 24 hours: Temp:  [98.1 F (36.7 C)-99 F (37.2 C)] 98.1 F (36.7 C) (02/28 0456) Pulse Rate:  [58-61] 58 (02/28 0456) Resp:  [18] 18 (02/28 0456) BP: (133-138)/(74-75) 133/75 (02/28 0456) SpO2:  [99 %-100 %] 100 % (02/28 0456) Last BM Date: 11/06/17  Intake/Output from previous day: No intake/output data recorded. Intake/Output this shift: Total I/O In: 300 [P.O.:300] Out: -   PE: Gen:  Alert, NAD, pleasant Card:  Regular rate and rhythm, pedal pulses 2+ BL Pulm:  Normal effort, clear to auscultation bilaterally Abd: Soft, non-tender, non-distended, bowel sounds present, no HSM Skin: warm and dry, no rashes  Psych: A&Ox3   Lab Results:  No results for input(s): WBC, HGB, HCT, PLT in the last 72 hours. BMET Recent Labs    11/11/17 0426 11/13/17 0406  NA 142 138  K 3.6 4.0  CL 109 107  CO2 25 25  GLUCOSE 111* 82  BUN 5* 6  CREATININE 0.81 0.77  CALCIUM 8.8* 8.6*   PT/INR No results for input(s): LABPROT, INR in the last 72 hours. CMP     Component Value Date/Time   NA 138 11/13/2017 0406   K 4.0 11/13/2017 0406   CL 107 11/13/2017 0406   CO2 25 11/13/2017 0406   GLUCOSE 82 11/13/2017 0406   BUN 6 11/13/2017 0406   CREATININE 0.77 11/13/2017 0406   CREATININE 0.83 09/26/2015 0955   CALCIUM 8.6 (L) 11/13/2017 0406   PROT 7.6 11/06/2017 1708   ALBUMIN 4.2 11/06/2017 1708   AST 22 11/06/2017 1708   ALT 16 11/06/2017 1708   ALKPHOS 71 11/06/2017 1708   BILITOT 0.9 11/06/2017 1708   GFRNONAA >60 11/13/2017 0406   GFRAA >60 11/13/2017 0406   Lipase     Component Value Date/Time   LIPASE 24 11/06/2017 1708       Studies/Results: Dg Abd 2 Views  Result Date: 11/11/2017 CLINICAL DATA:  Abdominal distention.  Constipation. EXAM:  ABDOMEN - 2 VIEW COMPARISON:  11/09/2017.  CT 10/16/2017. FINDINGS: Interval removal of NG tube. Distended loops of small bowel are noted. Similar findings noted on prior exam. Oral contrast in the colon. These findings suggest partial small bowel obstruction. No free air. Pelvic calcifications consistent phleboliths. No acute bony abnormality. IMPRESSION: 1.  Interim removal of NG tube. 2. Persistent distended loops of small bowel. Similar findings noted on prior exam. Oral contrast in the colon. These findings are consistent with partial small bowel obstruction. Continued follow-up exams to demonstrate resolution suggested. Electronically Signed   By: Maisie Fushomas  Register   On: 11/11/2017 11:58   Dg Abd Portable 1v  Result Date: 11/13/2017 CLINICAL DATA:  Small-bowel obstruction EXAM: PORTABLE ABDOMEN - 1 VIEW COMPARISON:  11/11/2017. FINDINGS: Persistent but improved distention of small bowel loops. Oral contrast again noted the colon. Colon is nondistended. No free air. Pelvic calcification consistent phlebolith. No acute bony abnormality. IMPRESSION: Persistent but improved distention of small bowel loops. Colonic gas pattern is normal. Oral contrast in the colon. Continued follow-up exams suggested to demonstrate resolution of the residual small bowel distention. Electronically Signed   By: Maisie Fushomas  Register   On: 11/13/2017 07:19    Anti-infectives: Anti-infectives (From admission, onward)   None       Assessment/Plan Hypothyroidism - IV synthroid  until tolerating PO Hx of SBO - s/p LOA 10/28/16  SBO - NGTout - encourage OOB and ambulation  - KUB this AM improved and patient with BM overnight - advance to FLD - continue bowel regimen   FEN:FLD VTE: SCDs, lovenox ID: no abx indicated  Plan: Advance to FLD, can advance to soft diet this evening if tolerating FLD. Continue bowel regimen. If patient tolerating soft diet and having bowel function, stable for discharge home.    LOS:  7 days    Wells Guiles , [facility] 11/13/2017, 9:25 AM Pager: [phone] Consults: [phone]  Agree with above. She ate her entire breakfast. She has this habit of birping, which seems unrelated to bowel obstruction.  Ovidio Kin, MD, [facility] Pager: [phone] Office phone:  [phone]

## 2017-11-13 NOTE — Progress Notes (Signed)
PROGRESS NOTE    Debbie Brandt  ZOX:[id number] DOB: 14-Jun-1961 DOA: 11/06/2017 PCP: Darrow Bussing, MD    Brief Narrative: Debbie Brandt is a 57 year old female with past medical history of small bowel obstruction status post lysis of adhesions in February 2018.  Presented to the emergency department complaining of abdominal pain.  Upon ED evaluation CT of the abdomen suggested possible internal hernia with small bowel obstruction.  General surgery was consulted and patient was started on small bowel protocol.  NG tube was placed, patient was n.p.o. and GI series was performed.  NG tube has been removed, she she has been advanced to clear liquids, she is able to tolerate liquid diet.  Patient reports she had a large bowel movement last night and is able to pass lattice and continues to belch.  Her x-ray earlier this morning shows partial resolution of the SBO.     Assessment & Plan:   Principal Problem:   SBO (small bowel obstruction) (HCC) Active Problems:   Hypothyroidism   Small bowel obstruction.  NG tube removed.  Repeat abd x ray show improvement.  Started on clears and improving.  Advance diet to full liquid diet today and if she is able to tolerate plan to advance to soft diet later on.  BM last night. Surgery on board and improving.    Hypothyroidism:  On synthroid.    Hypokalemia:  Replaced.     DVT prophylaxis: lovenox.  Code Status:  Full code.  Family Communication: none at bedside.  Disposition Plan: pending resolution of sbo.  Possible discharge home tomorrow   Consultants:   Surgery.   Procedures: NG TUBE.   Antimicrobials: none.   Subjective: No new complaints, nausea is better she had a large bowel movement last night.  She continues to belch and able to tolerate clear liquid diet  Objective: Vitals:   11/12/17 0139 11/12/17 0526 11/12/17 2135 11/13/17 0456  BP: (!) 123/52 130/83 138/74 133/75  Pulse: 63 (!) 59 61 (!) 58  Resp: 17 17 18  18   Temp:  98.2 F (36.8 C) 99 F (37.2 C) 98.1 F (36.7 C)  TempSrc:  Oral Oral Oral  SpO2: 100% 100% 99% 100%  Weight:      Height:        Intake/Output Summary (Last 24 hours) at 11/13/2017 1153 Last data filed at 11/13/2017 8119 Gross per 24 hour  Intake 300 ml  Output -  Net 300 ml   Filed Weights   11/07/17 0054  Weight: 75.2 kg (165 lb 12.6 oz)    Examination:  General exam: Appears calm and comfortable, no distress Respiratory system: Clear to auscultation. Respiratory effort normal.  No wheezing or rhonchi Cardiovascular system: S1 & S2 heard, RRR. No JVD, murmurs,  No pedal edema. Gastrointestinal system: Abdomen is distended, soft and nontender. No organomegaly or masses felt. Normal bowel sounds heard. Central nervous system: Alert and oriented. No focal neurological deficits. Extremities: Symmetric 5 x 5 power.  No cyanosis or clubbing Skin: No rashes, lesions or ulcers Psychiatry: . Mood & affect appropriate.     Data Reviewed: I have personally reviewed following labs and imaging studies  CBC: Recent Labs  Lab 11/06/17 1708 11/07/17 0530 11/08/17 0544 11/09/17 0552  WBC 6.8 6.6 5.5 4.0  NEUTROABS  --   --  3.6 2.3  HGB 13.6 13.1 12.0 11.5*  HCT 41.8 39.9 37.1 36.1  MCV 85.3 85.6 87.1 87.4  PLT 437* 428* 381 347  Basic Metabolic Panel: Recent Labs  Lab 11/08/17 0544 11/09/17 0552 11/10/17 0922 11/11/17 0426 11/13/17 0406  NA 142 140 143 142 138  K 3.6 3.7 3.8 3.6 4.0  CL 103 105 108 109 107  CO2 28 26 26 25 25   GLUCOSE 118* 101* 103* 111* 82  BUN 11 7 6  5* 6  CREATININE 0.89 0.83 0.79 0.81 0.77  CALCIUM 8.6* 8.3* 8.8* 8.8* 8.6*  MG 2.0  --   --   --   --    GFR: Estimated Creatinine Clearance: 72.7 mL/min (by C-G formula based on SCr of 0.77 mg/dL). Liver Function Tests: Recent Labs  Lab 11/06/17 1708  AST 22  ALT 16  ALKPHOS 71  BILITOT 0.9  PROT 7.6  ALBUMIN 4.2   Recent Labs  Lab 11/06/17 1708  LIPASE 24   No  results for input(s): AMMONIA in the last 168 hours. Coagulation Profile: No results for input(s): INR, PROTIME in the last 168 hours. Cardiac Enzymes: No results for input(s): CKTOTAL, CKMB, CKMBINDEX, TROPONINI in the last 168 hours. BNP (last 3 results) No results for input(s): PROBNP in the last 8760 hours. HbA1C: No results for input(s): HGBA1C in the last 72 hours. CBG: No results for input(s): GLUCAP in the last 168 hours. Lipid Profile: No results for input(s): CHOL, HDL, LDLCALC, TRIG, CHOLHDL, LDLDIRECT in the last 72 hours. Thyroid Function Tests: No results for input(s): TSH, T4TOTAL, FREET4, T3FREE, THYROIDAB in the last 72 hours. Anemia Panel: No results for input(s): VITAMINB12, FOLATE, FERRITIN, TIBC, IRON, RETICCTPCT in the last 72 hours. Sepsis Labs: No results for input(s): PROCALCITON, LATICACIDVEN in the last 168 hours.  No results found for this or any previous visit (from the past 240 hour(s)).       Radiology Studies: Dg Abd Portable 1v  Result Date: 11/13/2017 CLINICAL DATA:  Small-bowel obstruction EXAM: PORTABLE ABDOMEN - 1 VIEW COMPARISON:  11/11/2017. FINDINGS: Persistent but improved distention of small bowel loops. Oral contrast again noted the colon. Colon is nondistended. No free air. Pelvic calcification consistent phlebolith. No acute bony abnormality. IMPRESSION: Persistent but improved distention of small bowel loops. Colonic gas pattern is normal. Oral contrast in the colon. Continued follow-up exams suggested to demonstrate resolution of the residual small bowel distention. Electronically Signed   By: Maisie Fushomas  Register   On: 11/13/2017 07:19        Scheduled Meds: . docusate sodium  100 mg Oral BID  . enoxaparin (LOVENOX) injection  40 mg Subcutaneous Q24H  . latanoprost  1 drop Right Eye QHS  . levothyroxine  75 mcg Oral QAC breakfast  . losartan  12.5 mg Oral Daily  . pantoprazole  40 mg Oral Daily   Continuous Infusions:   LOS: 7  days    Time spent: 35 MINUTES.     Kathlen ModyVijaya Ilyssa Grennan, MD [facility] Pager [phone]  If 7PM-7AM, please contact night-coverage [email or web address] Password [email or web address] 11/13/2017, 11:53 AM

## 2017-11-14 MED ORDER — DOCUSATE SODIUM 100 MG PO CAPS: 100.0000 mg | ORAL_CAPSULE | Freq: Two times a day (BID) | ORAL | 0 refills | Status: DC

## 2017-11-14 NOTE — Progress Notes (Addendum)
[facility] Progress Note     Subjective: CC: nausea overnight Patient had some nausea overnight but is feeling much better this AM after multiple BMs. Denies current nausea.  UOP good. VSS.   Objective: Vital signs in last 24 hours: Temp:  [97.9 F (36.6 C)-99.7 F (37.6 C)] 99.3 F (37.4 C) (03/01 0611) Pulse Rate:  [68-78] 78 (03/01 0611) Resp:  [18] 18 (03/01 0611) BP: (123-142)/(58-60) 136/60 (03/01 0611) SpO2:  [98 %-100 %] 100 % (03/01 0611) Last BM Date: 11/13/17  Intake/Output from previous day: 02/28 0701 - 03/01 0700 In: 780 [P.O.:780] Out: -  Intake/Output this shift: No intake/output data recorded.  PE: Gen:  Alert, NAD, pleasant Card:  Regular rate and rhythm, pedal pulses 2+ BL Pulm:  Normal effort, clear to auscultation bilaterally Abd: Soft, non-tender, non-distended, bowel sounds present, no HSM Skin: warm and dry, no rashes  Psych: A&Ox3   Lab Results:  No results for input(s): WBC, HGB, HCT, PLT in the last 72 hours. BMET Recent Labs    11/13/17 0406  NA 138  K 4.0  CL 107  CO2 25  GLUCOSE 82  BUN 6  CREATININE 0.77  CALCIUM 8.6*   PT/INR No results for input(s): LABPROT, INR in the last 72 hours. CMP     Component Value Date/Time   NA 138 11/13/2017 0406   K 4.0 11/13/2017 0406   CL 107 11/13/2017 0406   CO2 25 11/13/2017 0406   GLUCOSE 82 11/13/2017 0406   BUN 6 11/13/2017 0406   CREATININE 0.77 11/13/2017 0406   CREATININE 0.83 09/26/2015 0955   CALCIUM 8.6 (L) 11/13/2017 0406   PROT 7.6 11/06/2017 1708   ALBUMIN 4.2 11/06/2017 1708   AST 22 11/06/2017 1708   ALT 16 11/06/2017 1708   ALKPHOS 71 11/06/2017 1708   BILITOT 0.9 11/06/2017 1708   GFRNONAA >60 11/13/2017 0406   GFRAA >60 11/13/2017 0406   Lipase     Component Value Date/Time   LIPASE 24 11/06/2017 1708       Studies/Results: Dg Abd Portable 1v  Result Date: 11/13/2017 CLINICAL DATA:  Small-bowel obstruction EXAM: PORTABLE ABDOMEN - 1  VIEW COMPARISON:  11/11/2017. FINDINGS: Persistent but improved distention of small bowel loops. Oral contrast again noted the colon. Colon is nondistended. No free air. Pelvic calcification consistent phlebolith. No acute bony abnormality. IMPRESSION: Persistent but improved distention of small bowel loops. Colonic gas pattern is normal. Oral contrast in the colon. Continued follow-up exams suggested to demonstrate resolution of the residual small bowel distention. Electronically Signed   By: Maisie Fushomas  Register   On: 11/13/2017 07:19    Anti-infectives: Anti-infectives (From admission, onward)   None       Assessment/Plan Hypothyroidism - IV synthroid until tolerating PO Hx of SBO - s/p LOA 10/28/16  SBO - NGTout - encourage OOB and ambulation  - some nausea overnight but feeling better this AM - try soft diet today  - continue bowel regimen   ZOX:WRUEFEN:soft  VTE: SCDs, lovenox ID: no abx indicated  Plan: if tolerating soft diet and having bowel function, stable for discharge home.    LOS: 8 days   Wells GuilesKelly Rayburn , [facility] LPA-C [facility] 11/14/2017, 9:22 AM Pager: [phone] Consults: [phone]  Agree with above.  Ovidio Kinavid Deliyah Muckle, MD, [facility] Pager: [phone] Office phone:  [phone]

## 2017-11-15 ENCOUNTER — Other Ambulatory Visit: Payer: Managed Care, Other (non HMO)

## 2017-11-17 NOTE — Discharge Summary (Addendum)
Physician Discharge Summary  Sherron FlemingsCynthia L Diskin ZOX:[id number] DOB: 1961-03-26 DOA: 11/06/2017  PCP: Darrow BussingKoirala, Dibas, MD  Admit date: 11/06/2017 Discharge date: 11/14/2017  Admitted From: Home.  Disposition:  Home.   Recommendations for Outpatient Follow-up:  1. Follow up with PCP in 1-2 weeks 2. Please obtain BMP/CBC in one week Please follow up with surgery as needed)  Discharge Condition:stable.  CODE STATU: full code.  Diet recommendation: Heart Healthy   Brief/Interim Summary: Debbie ComberCynthia L McRaeis a 57 year old female with past medical history of small bowel obstruction status post lysis of adhesions in February 2018. Presented to the emergency department complaining of abdominal pain. Upon ED evaluation CT of the abdomen suggested possible internal hernia with small bowel obstruction. General surgery was consulted and patient was started on small bowel protocol. NG tube was placed, patient was n.p.o. and GI series was performed.NG tube has been removed, sheshe has been advanced to clear liquids, she is able to tolerate liquid diet.  Patient reports she had a large bowel movement last night and is able to pass lattice and continues to belch.  Her x-ray  shows partial resolution of the SBO.     Discharge Diagnoses:  Principal Problem:   SBO (small bowel obstruction) (HCC) Active Problems:   Hypothyroidism  Small bowel obstruction.  NG tube removed.  Repeat abd x ray show improvement.  Started on clears and improving.  was on soft diet without any symptoms of nausea or vomiting or abd pain.  She had BM and surgery cleared her for discharge.     Hypothyroidism:  On synthroid.    Hypokalemia:  Replaced.      Discharge Instructions  Discharge Instructions    Diet - low sodium heart healthy   Complete by:  As directed    Discharge instructions   Complete by:  As directed    Follow up with PCP in one week.     Allergies as of 11/14/2017   No Known Allergies      Medication List    STOP taking these medications   ibuprofen 200 MG tablet Commonly known as:  ADVIL,MOTRIN   oxyCODONE 5 MG immediate release tablet Commonly known as:  Oxy IR/ROXICODONE     TAKE these medications   APAP 325 MG tablet Plain Tylenol, acetaminophen:  You can take 2 tablets every 4 hours as needed.  Do not exceed 4000 mg per day.   Calcium Carbonate-Vitamin D 600-400 MG-UNIT tablet Take 1 tablet by mouth daily.   dicyclomine 20 MG tablet Commonly known as:  BENTYL Take 1 tablet by mouth 4 (four) times daily.   docusate sodium 100 MG capsule Commonly known as:  COLACE Take 1 capsule (100 mg total) by mouth 2 (two) times daily.   levothyroxine 75 MCG tablet Commonly known as:  SYNTHROID, LEVOTHROID Take 75 mcg by mouth daily before breakfast.   MULTIPLE VITAMIN PO Take 1 tablet by mouth daily.   omeprazole 20 MG capsule Commonly known as:  PRILOSEC Take 1 capsule (20 mg total) by mouth daily.   ondansetron 8 MG disintegrating tablet Commonly known as:  ZOFRAN-ODT Take 1 tablet by mouth 2 (two) times daily as needed for nausea.   TRAVATAN Z 0.004 % Soln ophthalmic solution Generic drug:  Travoprost (BAK Free) Place 1 drop into the right eye daily at 6 PM.   triamterene-hydrochlorothiazide 37.5-25 MG tablet Commonly known as:  MAXZIDE-25 See you primary care physician. Have them recheck your blood pressure and decide when to resume  this medication. What changed:    how much to take  how to take this  when to take this  additional instructions      Follow-up Information    Koirala, Dibas, MD. Schedule an appointment as soon as possible for a visit in 1 week(s).   Specialty:  Family Medicine Contact information: [address] [place] [address] [phone]          No Known Allergies  Consultations:  Surgery.    Procedures/Studies: Dg Abdomen 1 View  Result Date: 11/06/2017 CLINICAL DATA:  Evaluate  nasogastric tube placement. EXAM: ABDOMEN - 1 VIEW COMPARISON:  Abdominal radiograph October 28, 2016 and CT abdomen and pelvis October 16, 2017 FINDINGS: Nasogastric tube courses in distribution stomach with distal tip projecting in mid to distal duodenum. Included abdomen demonstrates mildly distended small bowel. Stool and air in the included large bowel. IMPRESSION: Nasogastric tube tip projecting in mid to distal duodenum. Included abdomen demonstrates mildly prominent small bowel, possible early small bowel obstruction. Electronically Signed   By: Awilda Metro M.D.   On: 11/06/2017 23:04   Dg Abd 2 Views  Result Date: 11/11/2017 CLINICAL DATA:  Abdominal distention.  Constipation. EXAM: ABDOMEN - 2 VIEW COMPARISON:  11/09/2017.  CT 10/16/2017. FINDINGS: Interval removal of NG tube. Distended loops of small bowel are noted. Similar findings noted on prior exam. Oral contrast in the colon. These findings suggest partial small bowel obstruction. No free air. Pelvic calcifications consistent phleboliths. No acute bony abnormality. IMPRESSION: 1.  Interim removal of NG tube. 2. Persistent distended loops of small bowel. Similar findings noted on prior exam. Oral contrast in the colon. These findings are consistent with partial small bowel obstruction. Continued follow-up exams to demonstrate resolution suggested. Electronically Signed   By: Maisie Fus  Register   On: 11/11/2017 11:58   Dg Abd Portable 1v  Result Date: 11/13/2017 CLINICAL DATA:  Small-bowel obstruction EXAM: PORTABLE ABDOMEN - 1 VIEW COMPARISON:  11/11/2017. FINDINGS: Persistent but improved distention of small bowel loops. Oral contrast again noted the colon. Colon is nondistended. No free air. Pelvic calcification consistent phlebolith. No acute bony abnormality. IMPRESSION: Persistent but improved distention of small bowel loops. Colonic gas pattern is normal. Oral contrast in the colon. Continued follow-up exams suggested to  demonstrate resolution of the residual small bowel distention. Electronically Signed   By: Maisie Fus  Register   On: 11/13/2017 07:19   Dg Abd Portable 1v  Result Date: 11/09/2017 CLINICAL DATA:  Abdominal pain EXAM: PORTABLE ABDOMEN - 1 VIEW COMPARISON:  T 12/03/2017 FINDINGS: Nasogastric tube tip remains in the gastric antrum region. Previously administered contrast remains within the right colon. Mild prominence of the small bowel gas pattern which could go along with mild ileus or partial small bowel obstruction. Not definitely abnormal however. IMPRESSION: Nasogastric tube tip in the gastric antrum. No progression of the hyperdense material in the right colon. Slight prominence of the proximal small bowel which could be within normal limits or mildly abnormal as above. Electronically Signed   By: Paulina Fusi M.D.   On: 11/09/2017 06:28   Dg Abd Portable 1v  Result Date: 11/08/2017 CLINICAL DATA:  Nasogastric position. EXAM: PORTABLE ABDOMEN - 1 VIEW COMPARISON:  11/07/2017 FINDINGS: Nasogastric tube tip is in the antrum or pylorus region. Bowel gas pattern is normal. Previously administered contrast remains within the right colon. IMPRESSION: Nasogastric tube tip in the antrum or pylorus. Electronically Signed   By: Scherrie Bateman.D.  On: 11/08/2017 06:46   Dg Abd Portable 1v-small Bowel Obstruction Protocol-initial, 8 Hr Delay  Result Date: 11/07/2017 CLINICAL DATA:  8 hour delay, bowel obstruction EXAM: PORTABLE ABDOMEN - 1 VIEW COMPARISON:  11/06/2017, CT 10/16/2017 FINDINGS: Tip of the esophageal tube projects over the upper abdomen. Some retained contrast within pelvic small bowel loops. There is continued mild gaseous enlargement of left abdominal small bowel loops up to 3.6 cm. There is contrast present in the right colon and appendix. Calcified pelvic phleboliths. IMPRESSION: 1. Contrast in the right colon with mild retained contrast within pelvic small bowel loops 2. There is persistent mild  dilatation of left abdominal small bowel, query mild ileus Electronically Signed   By: Jasmine Pang M.D.   On: 11/07/2017 18:44       Subjective: No new complaints.   Discharge Exam: Vitals:   11/14/17 0611 11/14/17 1422  BP: 136/60 (!) 106/57  Pulse: 78 72  Resp: 18 18  Temp: 99.3 F (37.4 C) 98.6 F (37 C)  SpO2: 100% 100%   Vitals:   11/13/17 1359 11/13/17 2141 11/14/17 0611 11/14/17 1422  BP: 123/60 (!) 142/58 136/60 (!) 106/57  Pulse: 68 71 78 72  Resp: 18 18 18 18   Temp: 97.9 F (36.6 C) 99.7 F (37.6 C) 99.3 F (37.4 C) 98.6 F (37 C)  TempSrc: Oral Oral Oral Oral  SpO2: 99% 98% 100% 100%  Weight:      Height:        General: Pt is alert, awake, not in acute distress Cardiovascular: RRR, S1/S2 +, no rubs, no gallops Respiratory: CTA bilaterally, no wheezing, no rhonchi Abdominal: Soft, NT, ND, bowel sounds + Extremities: no edema, no cyanosis    The results of significant diagnostics from this hospitalization (including imaging, microbiology, ancillary and laboratory) are listed below for reference.     Microbiology: No results found for this or any previous visit (from the past 240 hour(s)).   Labs: BNP (last 3 results) No results for input(s): BNP in the last 8760 hours. Basic Metabolic Panel: Recent Labs  Lab 11/10/17 0922 11/11/17 0426 11/13/17 0406  NA 143 142 138  K 3.8 3.6 4.0  CL 108 109 107  CO2 26 25 25   GLUCOSE 103* 111* 82  BUN 6 5* 6  CREATININE 0.79 0.81 0.77  CALCIUM 8.8* 8.8* 8.6*   Liver Function Tests: No results for input(s): AST, ALT, ALKPHOS, BILITOT, PROT, ALBUMIN in the last 168 hours. No results for input(s): LIPASE, AMYLASE in the last 168 hours. No results for input(s): AMMONIA in the last 168 hours. CBC: No results for input(s): WBC, NEUTROABS, HGB, HCT, MCV, PLT in the last 168 hours. Cardiac Enzymes: No results for input(s): CKTOTAL, CKMB, CKMBINDEX, TROPONINI in the last 168 hours. BNP: Invalid  input(s): POCBNP CBG: No results for input(s): GLUCAP in the last 168 hours. D-Dimer No results for input(s): DDIMER in the last 72 hours. Hgb A1c No results for input(s): HGBA1C in the last 72 hours. Lipid Profile No results for input(s): CHOL, HDL, LDLCALC, TRIG, CHOLHDL, LDLDIRECT in the last 72 hours. Thyroid function studies No results for input(s): TSH, T4TOTAL, T3FREE, THYROIDAB in the last 72 hours.  Invalid input(s): FREET3 Anemia work up No results for input(s): VITAMINB12, FOLATE, FERRITIN, TIBC, IRON, RETICCTPCT in the last 72 hours. Urinalysis    Component Value Date/Time   COLORURINE YELLOW 11/06/2017 2105   APPEARANCEUR CLEAR 11/06/2017 2105   LABSPEC >1.046 (H) 11/06/2017 2105   PHURINE 5.0  11/06/2017 2105   GLUCOSEU NEGATIVE 11/06/2017 2105   HGBUR SMALL (A) 11/06/2017 2105   BILIRUBINUR NEGATIVE 11/06/2017 2105   KETONESUR 20 (A) 11/06/2017 2105   PROTEINUR NEGATIVE 11/06/2017 2105   UROBILINOGEN 0.2 04/15/2008 1541   NITRITE NEGATIVE 11/06/2017 2105   LEUKOCYTESUR NEGATIVE 11/06/2017 2105   Sepsis Labs Invalid input(s): PROCALCITONIN,  WBC,  LACTICIDVEN Microbiology No results found for this or any previous visit (from the past 240 hour(s)).   Time coordinating discharge: Over 30 minutes  SIGNED:   Kathlen Mody, MD  [facility] 11/17/2017, 7:51 AM Pager   If 7PM-7AM, please contact night-coverage [email or web address] Password [email or web address]

## 2017-11-20 ENCOUNTER — Inpatient Hospital Stay (HOSPITAL_COMMUNITY)
Admission: EM | Admit: 2017-11-20 | Discharge: 2017-11-27 | DRG: 337 | Disposition: A | Discharge status: Home / Self Care | Payer: Managed Care, Other (non HMO) | Attending: Internal Medicine | Admitting: Internal Medicine

## 2017-11-20 ENCOUNTER — Emergency Department (HOSPITAL_COMMUNITY)
Admission: EM | Admit: 2017-11-20 | Discharge: 2017-11-20 | Discharge status: Absent without leave | Payer: Managed Care, Other (non HMO)

## 2017-11-20 ENCOUNTER — Encounter (HOSPITAL_COMMUNITY): Payer: Self-pay | Admitting: Emergency Medicine

## 2017-11-20 ENCOUNTER — Emergency Department (HOSPITAL_COMMUNITY): Payer: Managed Care, Other (non HMO)

## 2017-11-20 DIAGNOSIS — Z6830 Body mass index (BMI) 30.0-30.9, adult: Secondary | ICD-10-CM

## 2017-11-20 DIAGNOSIS — K219 Gastro-esophageal reflux disease without esophagitis: Secondary | ICD-10-CM | POA: Diagnosis present

## 2017-11-20 DIAGNOSIS — K56609 Unspecified intestinal obstruction, unspecified as to partial versus complete obstruction: Secondary | ICD-10-CM | POA: Diagnosis present

## 2017-11-20 DIAGNOSIS — K5651 Intestinal adhesions [bands], with partial obstruction: Secondary | ICD-10-CM | POA: Diagnosis not present

## 2017-11-20 DIAGNOSIS — K565 Intestinal adhesions [bands], unspecified as to partial versus complete obstruction: Secondary | ICD-10-CM | POA: Diagnosis present

## 2017-11-20 DIAGNOSIS — E669 Obesity, unspecified: Secondary | ICD-10-CM | POA: Diagnosis present

## 2017-11-20 DIAGNOSIS — E039 Hypothyroidism, unspecified: Secondary | ICD-10-CM | POA: Diagnosis present

## 2017-11-20 DIAGNOSIS — Z79890 Hormone replacement therapy (postmenopausal): Secondary | ICD-10-CM

## 2017-11-20 DIAGNOSIS — E876 Hypokalemia: Secondary | ICD-10-CM | POA: Diagnosis present

## 2017-11-20 DIAGNOSIS — K439 Ventral hernia without obstruction or gangrene: Secondary | ICD-10-CM | POA: Diagnosis present

## 2017-11-20 DIAGNOSIS — Z90710 Acquired absence of both cervix and uterus: Secondary | ICD-10-CM

## 2017-11-20 DIAGNOSIS — Z8249 Family history of ischemic heart disease and other diseases of the circulatory system: Secondary | ICD-10-CM

## 2017-11-20 DIAGNOSIS — R112 Nausea with vomiting, unspecified: Secondary | ICD-10-CM | POA: Diagnosis not present

## 2017-11-20 DIAGNOSIS — D6489 Other specified anemias: Secondary | ICD-10-CM | POA: Diagnosis present

## 2017-11-20 DIAGNOSIS — I10 Essential (primary) hypertension: Secondary | ICD-10-CM | POA: Diagnosis present

## 2017-11-20 DIAGNOSIS — Z0189 Encounter for other specified special examinations: Secondary | ICD-10-CM

## 2017-11-20 LAB — COMPREHENSIVE METABOLIC PANEL
ALBUMIN: 4.1 g/dL (ref 3.5–5.0)
ALK PHOS: 63 U/L (ref 38–126)
ALT: 17 U/L (ref 14–54)
ANION GAP: 10 (ref 5–15)
AST: 18 U/L (ref 15–41)
BUN: 13 mg/dL (ref 6–20)
CALCIUM: 9.6 mg/dL (ref 8.9–10.3)
CO2: 28 mmol/L (ref 22–32)
Chloride: 103 mmol/L (ref 101–111)
Creatinine, Ser: 0.82 mg/dL (ref 0.44–1.00)
GFR calc Af Amer: >60 mL/min (ref >60)
GFR calc non Af Amer: >60 mL/min (ref >60)
GLUCOSE: 112 mg/dL — AB (ref 65–99)
Potassium: 3.9 mmol/L (ref 3.5–5.1)
SODIUM: 141 mmol/L (ref 135–145)
Total Bilirubin: 0.5 mg/dL (ref 0.3–1.2)
Total Protein: 7.6 g/dL (ref 6.5–8.1)

## 2017-11-20 LAB — CBC
HCT: 42.1 % (ref 36.0–46.0)
HEMOGLOBIN: 13.8 g/dL (ref 12.0–15.0)
MCH: 28.3 pg (ref 26.0–34.0)
MCHC: 32.8 g/dL (ref 30.0–36.0)
MCV: 86.3 fL (ref 78.0–100.0)
Platelets: 574 10*3/uL — ABNORMAL HIGH (ref 150–400)
RBC: 4.88 MIL/uL (ref 3.87–5.11)
RDW: 13.4 % (ref 11.5–15.5)
WBC: 4.7 10*3/uL (ref 4.0–10.5)

## 2017-11-20 LAB — LIPASE, BLOOD: Lipase: 26 U/L (ref 11–51)

## 2017-11-20 LAB — URINALYSIS, ROUTINE W REFLEX MICROSCOPIC
BILIRUBIN URINE: NEGATIVE
GLUCOSE, UA: NEGATIVE mg/dL
HGB URINE DIPSTICK: NEGATIVE
Ketones, ur: 20 mg/dL — AB
Leukocytes, UA: NEGATIVE
Nitrite: NEGATIVE
Protein, ur: NEGATIVE mg/dL
SPECIFIC GRAVITY, URINE: 1.02 (ref 1.005–1.030)
pH: 7 (ref 5.0–8.0)

## 2017-11-20 LAB — I-STAT BETA HCG BLOOD, ED (MC, WL, AP ONLY): HCG, QUANTITATIVE: 19.7 m[IU]/mL — AB (ref <5)

## 2017-11-20 MED ORDER — SODIUM CHLORIDE 0.9 % IJ SOLN
INTRAMUSCULAR | Status: AC
  Filled 2017-11-20: qty 50

## 2017-11-20 MED ORDER — ONDANSETRON HCL 4 MG/2ML IJ SOLN
4.0000 mg | Freq: Once | INTRAMUSCULAR | Status: AC
  Administered 2017-11-20: 4 mg via INTRAVENOUS
  Filled 2017-11-20: qty 2

## 2017-11-20 MED ORDER — HYDROMORPHONE HCL 1 MG/ML IJ SOLN
1.0000 mg | Freq: Once | INTRAMUSCULAR | Status: AC
  Administered 2017-11-20: 1 mg via INTRAVENOUS
  Filled 2017-11-20: qty 1

## 2017-11-20 MED ORDER — IOPAMIDOL (ISOVUE-300) INJECTION 61%
INTRAVENOUS | Status: AC
  Administered 2017-11-20: 100 mL
  Filled 2017-11-20: qty 100

## 2017-11-20 NOTE — ED Triage Notes (Signed)
Per [facility] patient from home for abd pain that started last night. Denies n/v/d or constipation. Had abd surgery year ago.

## 2017-11-20 NOTE — ED Provider Notes (Signed)
[facility]-EMERGENCY DEPT Provider Note   CSN: [id number] Arrival date & time: 11/20/17  1345     History   Chief Complaint Chief Complaint  Patient presents with  . Abdominal Pain    HPI Debbie Brandt is a 57 y.o. female.  HPI Pt presented to the ED for evaluation of recurrent abdominal pain.  The patient has a history of recent small bowel obstruction.  She was admitted to the hospital on February 21 of this year.  Patient states she went home Saturday.  She had been doing better.  She had a couple of normal bowel movements.  Patient states she started having pain again last night.  Her last bowel movement was yesterday.  She complains of nausea but no vomiting.  No fevers.  No dysuria.  Her pain is primarily in the lower abdomen and it is severe at a 9. Past Medical History:  Diagnosis Date  . Chest pain 09/26/2015  . Essential hypertension 09/26/2015  . GERD (gastroesophageal reflux disease)   . Hypertension   . Hypothyroidism 09/26/2015  . Murmur 09/26/2015  . Thyroid disease     Patient Active Problem List   Diagnosis Date Noted  . SBO (small bowel obstruction) (HCC) 11/06/2017  . Small bowel obstruction due to adhesions (HCC) 10/28/2016  . Small bowel obstruction s/p ex lap & lysis of adhesions 10/28/2016 10/23/2016  . Abdominal mass 10/23/2016  . Hypokalemia 10/23/2016  . Murmur 09/26/2015  . Chest pain 09/26/2015  . HTN (hypertension) 09/26/2015  . Hypothyroidism 09/26/2015    Past Surgical History:  Procedure Laterality Date  . ABDOMINAL HYSTERECTOMY    . CESAREAN SECTION    . LAPAROTOMY N/A 10/28/2016   Procedure: EXPLORATORY LAPAROTOMY WITH  LYSIS OF ADHESIONS;  Surgeon: Avel Peace, MD;  Location: [facility];  Service: General;  Laterality: N/A;    OB History    No data available       Home Medications    Prior to Admission medications   Medication Sig Start Date End Date Taking? Authorizing Provider  Calcium  Carbonate-Vitamin D 600-400 MG-UNIT per tablet Take 1 tablet by mouth daily.   Yes [provider]  levothyroxine (SYNTHROID, LEVOTHROID) 75 MCG tablet Take 75 mcg by mouth daily before breakfast.   Yes [provider]  losartan-hydrochlorothiazide (HYZAAR) 50-12.5 MG tablet Take 1 tablet by mouth daily.   Yes [provider]  MULTIPLE VITAMIN PO Take 1 tablet by mouth daily.   Yes [provider]  omeprazole (PRILOSEC) 20 MG capsule Take 1 capsule (20 mg total) by mouth daily. 03/09/16  Yes Palumbo, April, MD  TRAVATAN Z 0.004 % SOLN ophthalmic solution Place 1 drop into the right eye daily at 6 PM.  12/10/15  Yes [provider]  acetaminophen 325 MG tablet Plain Tylenol, acetaminophen:  You can take 2 tablets every 4 hours as needed.  Do not exceed 4000 mg per day. Patient not taking: Reported on 11/20/2017 11/04/16   Sherrie George, PA-C  docusate sodium (COLACE) 100 MG capsule Take 1 capsule (100 mg total) by mouth 2 (two) times daily. Patient not taking: Reported on 11/20/2017 11/14/17   Kathlen Mody, MD  triamterene-hydrochlorothiazide (MAXZIDE-25) 37.5-25 MG tablet See you primary care physician. Have them recheck your blood pressure and decide when to resume this medication. Patient taking differently: Take 1 tablet by mouth daily. See you primary care physician. Have them recheck your blood pressure and decide when to resume this medication. 11/04/16  Sherrie George, PA-C    Family History Family History  Problem Relation Age of Onset  . Cancer Mother        lung cancer  . Hypertension Mother   . Cancer Father   . Heart attack Father   . Mental illness Sister   . Hypertension Sister   . Hyperlipidemia Sister   . Diabetes Brother   . Hypertension Brother     Social History Social History   Tobacco Use  . Smoking status: Never Smoker  . Smokeless tobacco: Never Used  Substance Use Topics  . Alcohol use: No    Alcohol/week: 0.0 oz   . Drug use: No     Allergies   Patient has no known allergies.   Review of Systems Review of Systems  All other systems reviewed and are negative.    Physical Exam Updated Vital Signs BP (!) 144/59   Pulse (!) 58   Temp (!) 97.5 F (36.4 C) (Oral)   Resp 20   SpO2 99%   Physical Exam  Constitutional: She appears ill. No distress.  HENT:  Head: Normocephalic and atraumatic.  Right Ear: External ear normal.  Left Ear: External ear normal.  Eyes: Conjunctivae are normal. Right eye exhibits no discharge. Left eye exhibits no discharge. No scleral icterus.  Neck: Neck supple. No tracheal deviation present.  Cardiovascular: Normal rate, regular rhythm and intact distal pulses.  Pulmonary/Chest: Effort normal and breath sounds normal. No stridor. No respiratory distress. She has no wheezes. She has no rales.  Abdominal: Soft. She exhibits no distension. Bowel sounds are decreased. There is generalized tenderness. There is no rebound and no guarding. No hernia.  Musculoskeletal: She exhibits no edema or tenderness.  Neurological: She is alert. She has normal strength. No cranial nerve deficit (no facial droop, extraocular movements intact, no slurred speech) or sensory deficit. She exhibits normal muscle tone. She displays no seizure activity. Coordination normal.  Skin: Skin is warm and dry. No rash noted.  Psychiatric: She has a normal mood and affect.  Nursing note and vitals reviewed.    ED Treatments / Results  Labs (all labs ordered are listed, but only abnormal results are displayed) Labs Reviewed  COMPREHENSIVE METABOLIC PANEL - Abnormal; Notable for the following components:      Result Value   Glucose, Bld 112 (*)    All other components within normal limits  CBC - Abnormal; Notable for the following components:   Platelets 574 (*)    All other components within normal limits  URINALYSIS, ROUTINE W REFLEX MICROSCOPIC - Abnormal; Notable for the following  components:   Ketones, ur 20 (*)    All other components within normal limits  I-STAT BETA HCG BLOOD, ED ([facility], [facility], [facility] ONLY) - Abnormal; Notable for the following components:   I-stat hCG, quantitative 19.7 (*)    All other components within normal limits  LIPASE, BLOOD     Radiology Ct Abdomen Pelvis W Contrast  Result Date: 11/20/2017 CLINICAL DATA:  Abdominal pain EXAM: CT ABDOMEN AND PELVIS WITH CONTRAST TECHNIQUE: Multidetector CT imaging of the abdomen and pelvis was performed using the standard protocol following bolus administration of intravenous contrast. CONTRAST:  ISOVUE-300 IOPAMIDOL (ISOVUE-300) INJECTION 61% COMPARISON:  Radiograph 228 2019, CT 10/16/2017, 10/23/2016, 10/22/2016 FINDINGS: Lower chest: Lung bases demonstrate no acute consolidation or pleural effusion. Borderline heart size. Hepatobiliary: No focal liver abnormality is seen. No gallstones, gallbladder wall thickening, or biliary dilatation. Pancreas: Unremarkable. No pancreatic ductal dilatation  or surrounding inflammatory changes. Spleen: Normal in size without focal abnormality. Adrenals/Urinary Tract: Adrenal glands are within normal limits. No hydronephrosis. Cyst in the upper pole of the left kidney. Bladder unremarkable. Stomach/Bowel: Moderate enlargement of the stomach. Dilated loops of small bowel measuring up to 4.2 cm with fecalized segment of dilated small bowel in the mid pelvis. Transition point appears to be associated with crowded appearance of mesenteric vasculature with suggested mesenteric swirling pattern, series 2, image number 52. No colon wall thickening. Normal appendix. Distal small bowel is decompressed. Vascular/Lymphatic: Nonaneurysmal aorta. No significantly enlarged lymph nodes. Reproductive: Status post hysterectomy. No adnexal masses. Other: Negative for free air or free fluid. Musculoskeletal: Degenerative changes. No acute or suspicious lesion. IMPRESSION: 1. Dilated loops of small bowel  with fecalized segment of small bowel in the right pelvis, consistent with mechanical small bowel obstruction. Transition point appears to be within the upper pelvis where there is mesenteric vascular crowding and slight swirled appearance of the vasculature, either representing volvulus or obstruction related to internal hernia and raising concern for closed loop obstruction. 2. Otherwise no significant interval change compared to prior studies. Electronically Signed   By: Jasmine PangKim  Fujinaga M.D.   On: 11/20/2017 23:27    Procedures Procedures (including critical care time)  Medications Ordered in ED Medications  sodium chloride 0.9 % injection (not administered)  HYDROmorphone (DILAUDID) injection 1 mg (1 mg Intravenous Given 11/20/17 2017)  ondansetron (ZOFRAN) injection 4 mg (4 mg Intravenous Given 11/20/17 2017)  iopamidol (ISOVUE-300) 61 % injection (100 mLs  Contrast Given 11/20/17 2258)     Initial Impression / Assessment and Plan / ED Course  I have reviewed the triage vital signs and the nursing notes.  Pertinent labs & imaging results that were available during my care of the patient were reviewed by me and considered in my medical decision making (see chart for details).  Clinical Course as of Mar 08 0001  Thu Nov 20, 2017  2015 CBC and electrolyte panel unremarkable.  Urinalysis still pending.  I-STAT hCG is 19.7 but I do not think the patient is pregnant.  This is a false positive result.  She has had a hysterectomy  [JK]  2358 Pt is pain free now.  Discussed case with Dr Michaell CowingGross.  Will consult on patient.  I will call hospitalist for admission.  [JK]    Clinical Course User Index [JK] Linwood DibblesKnapp, Fani Rotondo, MD   Patient presented to the emergency room for evaluation of recurrent abdominal pain after recent hospitalization for small bowel obstruction.  Patient's laboratory tests are reassuring.  CT scan unfortunately shows a recurrent bowel obstruction.  NG-tube has been ordered.  I Spoke with  general surgery.  I will consult with the medical service for admission   Final Clinical Impressions(s) / ED Diagnoses   Final diagnoses:  Small bowel obstruction (HCC)      Linwood DibblesKnapp, Anysa Tacey, MD 11/21/17 0001

## 2017-11-21 ENCOUNTER — Inpatient Hospital Stay (HOSPITAL_COMMUNITY): Payer: Managed Care, Other (non HMO) | Admitting: Certified Registered Nurse Anesthetist

## 2017-11-21 ENCOUNTER — Other Ambulatory Visit: Payer: Self-pay

## 2017-11-21 ENCOUNTER — Encounter (HOSPITAL_COMMUNITY): Payer: Self-pay | Admitting: Emergency Medicine

## 2017-11-21 ENCOUNTER — Emergency Department (HOSPITAL_COMMUNITY): Payer: Managed Care, Other (non HMO)

## 2017-11-21 ENCOUNTER — Encounter (HOSPITAL_COMMUNITY)
Admission: EM | Disposition: A | Discharge status: Home / Self Care | Payer: Self-pay | Source: Home / Self Care | Attending: Internal Medicine

## 2017-11-21 DIAGNOSIS — K219 Gastro-esophageal reflux disease without esophagitis: Secondary | ICD-10-CM | POA: Diagnosis present

## 2017-11-21 DIAGNOSIS — Z6830 Body mass index (BMI) 30.0-30.9, adult: Secondary | ICD-10-CM | POA: Diagnosis not present

## 2017-11-21 DIAGNOSIS — Z79890 Hormone replacement therapy (postmenopausal): Secondary | ICD-10-CM | POA: Diagnosis not present

## 2017-11-21 DIAGNOSIS — E669 Obesity, unspecified: Secondary | ICD-10-CM | POA: Diagnosis present

## 2017-11-21 DIAGNOSIS — E038 Other specified hypothyroidism: Secondary | ICD-10-CM | POA: Diagnosis not present

## 2017-11-21 DIAGNOSIS — R002 Palpitations: Secondary | ICD-10-CM | POA: Insufficient documentation

## 2017-11-21 DIAGNOSIS — K56609 Unspecified intestinal obstruction, unspecified as to partial versus complete obstruction: Secondary | ICD-10-CM | POA: Diagnosis not present

## 2017-11-21 DIAGNOSIS — Z90710 Acquired absence of both cervix and uterus: Secondary | ICD-10-CM | POA: Diagnosis not present

## 2017-11-21 DIAGNOSIS — K439 Ventral hernia without obstruction or gangrene: Secondary | ICD-10-CM | POA: Diagnosis present

## 2017-11-21 DIAGNOSIS — E033 Postinfectious hypothyroidism: Secondary | ICD-10-CM | POA: Diagnosis not present

## 2017-11-21 DIAGNOSIS — D6489 Other specified anemias: Secondary | ICD-10-CM | POA: Diagnosis present

## 2017-11-21 DIAGNOSIS — K5651 Intestinal adhesions [bands], with partial obstruction: Secondary | ICD-10-CM | POA: Diagnosis present

## 2017-11-21 DIAGNOSIS — E876 Hypokalemia: Secondary | ICD-10-CM | POA: Diagnosis not present

## 2017-11-21 DIAGNOSIS — Z8249 Family history of ischemic heart disease and other diseases of the circulatory system: Secondary | ICD-10-CM | POA: Diagnosis not present

## 2017-11-21 DIAGNOSIS — E039 Hypothyroidism, unspecified: Secondary | ICD-10-CM

## 2017-11-21 DIAGNOSIS — K565 Intestinal adhesions [bands], unspecified as to partial versus complete obstruction: Secondary | ICD-10-CM | POA: Diagnosis not present

## 2017-11-21 DIAGNOSIS — R112 Nausea with vomiting, unspecified: Secondary | ICD-10-CM | POA: Diagnosis present

## 2017-11-21 DIAGNOSIS — I10 Essential (primary) hypertension: Secondary | ICD-10-CM | POA: Diagnosis not present

## 2017-11-21 HISTORY — PX: LAPAROSCOPIC ABDOMINAL EXPLORATION: SHX6249

## 2017-11-21 LAB — BASIC METABOLIC PANEL
ANION GAP: 9 (ref 5–15)
BUN: 12 mg/dL (ref 6–20)
CALCIUM: 9.1 mg/dL (ref 8.9–10.3)
CO2: 29 mmol/L (ref 22–32)
Chloride: 104 mmol/L (ref 101–111)
Creatinine, Ser: 0.76 mg/dL (ref 0.44–1.00)
GFR calc Af Amer: >60 mL/min (ref >60)
GFR calc non Af Amer: >60 mL/min (ref >60)
GLUCOSE: 128 mg/dL — AB (ref 65–99)
Potassium: 3.4 mmol/L — ABNORMAL LOW (ref 3.5–5.1)
Sodium: 142 mmol/L (ref 135–145)

## 2017-11-21 LAB — MAGNESIUM: Magnesium: 1.9 mg/dL (ref 1.7–2.4)

## 2017-11-21 SURGERY — EXPLORATION, ABDOMEN, LAPAROSCOPIC
Anesthesia: General

## 2017-11-21 MED ORDER — LACTATED RINGERS IV BOLUS (SEPSIS): 1000.0000 mL | Freq: Three times a day (TID) | INTRAVENOUS | Status: DC | PRN

## 2017-11-21 MED ORDER — GUAIFENESIN-DM 100-10 MG/5ML PO SYRP: 10.0000 mL | ORAL_SOLUTION | ORAL | Status: DC | PRN

## 2017-11-21 MED ORDER — HYDRALAZINE HCL 20 MG/ML IJ SOLN: 10.0000 mg | INTRAMUSCULAR | Status: DC | PRN

## 2017-11-21 MED ORDER — SUCCINYLCHOLINE CHLORIDE 200 MG/10ML IV SOSY
PREFILLED_SYRINGE | INTRAVENOUS | Status: AC
  Filled 2017-11-21: qty 10

## 2017-11-21 MED ORDER — HYDROCORTISONE 2.5 % RE CREA
1.0000 | TOPICAL_CREAM | Freq: Four times a day (QID) | RECTAL | Status: DC | PRN
Start: 2017-11-21 — End: 2017-11-27
  Filled 2017-11-21: qty 28.35

## 2017-11-21 MED ORDER — MIDAZOLAM HCL 2 MG/2ML IJ SOLN
INTRAMUSCULAR | Status: AC
  Filled 2017-11-21: qty 2

## 2017-11-21 MED ORDER — FENTANYL CITRATE (PF) 100 MCG/2ML IJ SOLN
25.0000 ug | INTRAMUSCULAR | Status: DC | PRN
  Administered 2017-11-21 (×2): 50 ug via INTRAVENOUS

## 2017-11-21 MED ORDER — PHENOL 1.4 % MT LIQD
1.0000 | OROMUCOSAL | Status: DC | PRN
  Filled 2017-11-21: qty 177

## 2017-11-21 MED ORDER — FENTANYL CITRATE (PF) 100 MCG/2ML IJ SOLN
INTRAMUSCULAR | Status: AC
  Filled 2017-11-21: qty 2

## 2017-11-21 MED ORDER — MAGIC MOUTHWASH
15.0000 mL | Freq: Four times a day (QID) | ORAL | Status: DC | PRN
  Filled 2017-11-21: qty 15

## 2017-11-21 MED ORDER — POTASSIUM CHLORIDE IN NACL 40-0.9 MEQ/L-% IV SOLN
INTRAVENOUS | Status: DC
  Administered 2017-11-21: 125 mL/h via INTRAVENOUS
  Filled 2017-11-21 (×3): qty 1000

## 2017-11-21 MED ORDER — FENTANYL CITRATE (PF) 100 MCG/2ML IJ SOLN
INTRAMUSCULAR | Status: DC | PRN
  Administered 2017-11-21 (×2): 50 ug via INTRAVENOUS
  Administered 2017-11-21: 25 ug via INTRAVENOUS

## 2017-11-21 MED ORDER — MENTHOL 3 MG MT LOZG
1.0000 | LOZENGE | OROMUCOSAL | Status: DC | PRN
  Filled 2017-11-21: qty 9

## 2017-11-21 MED ORDER — ROCURONIUM BROMIDE 10 MG/ML (PF) SYRINGE
PREFILLED_SYRINGE | INTRAVENOUS | Status: AC
  Filled 2017-11-21: qty 5

## 2017-11-21 MED ORDER — PHENYLEPHRINE 40 MCG/ML (10ML) SYRINGE FOR IV PUSH (FOR BLOOD PRESSURE SUPPORT)
PREFILLED_SYRINGE | INTRAVENOUS | Status: DC | PRN
  Administered 2017-11-21 (×7): 80 ug via INTRAVENOUS

## 2017-11-21 MED ORDER — HYDROCORTISONE 1 % EX CREA
1.0000 "application " | TOPICAL_CREAM | Freq: Three times a day (TID) | CUTANEOUS | Status: DC | PRN
  Filled 2017-11-21: qty 28

## 2017-11-21 MED ORDER — LIDOCAINE HCL 1 % IJ SOLN
INTRAMUSCULAR | Status: DC | PRN
  Administered 2017-11-21: 5 mL

## 2017-11-21 MED ORDER — LIP MEDEX EX OINT
TOPICAL_OINTMENT | CUTANEOUS | Status: AC
  Administered 2017-11-21: 1
  Filled 2017-11-21: qty 7

## 2017-11-21 MED ORDER — DEXAMETHASONE SODIUM PHOSPHATE 10 MG/ML IJ SOLN
INTRAMUSCULAR | Status: AC
  Filled 2017-11-21: qty 1

## 2017-11-21 MED ORDER — MIDAZOLAM HCL 5 MG/5ML IJ SOLN
INTRAMUSCULAR | Status: DC | PRN
  Administered 2017-11-21: 2 mg via INTRAVENOUS

## 2017-11-21 MED ORDER — MORPHINE SULFATE (PF) 2 MG/ML IV SOLN
2.0000 mg | Freq: Once | INTRAVENOUS | Status: AC
Start: 2017-11-21 — End: 2017-11-21
  Administered 2017-11-21: 2 mg via INTRAVENOUS
  Filled 2017-11-21: qty 1

## 2017-11-21 MED ORDER — POTASSIUM CHLORIDE 10 MEQ/100ML IV SOLN
10.0000 meq | INTRAVENOUS | Status: AC
  Administered 2017-11-21 (×3): 10 meq via INTRAVENOUS
  Filled 2017-11-21 (×2): qty 100

## 2017-11-21 MED ORDER — SUGAMMADEX SODIUM 200 MG/2ML IV SOLN
INTRAVENOUS | Status: DC | PRN
  Administered 2017-11-21: 100 mg via INTRAVENOUS
  Administered 2017-11-21: 200 mg via INTRAVENOUS

## 2017-11-21 MED ORDER — ALUM & MAG HYDROXIDE-SIMETH 200-200-20 MG/5ML PO SUSP: 30.0000 mL | Freq: Four times a day (QID) | ORAL | Status: DC | PRN

## 2017-11-21 MED ORDER — ONDANSETRON HCL 4 MG/2ML IJ SOLN: 4.0000 mg | Freq: Four times a day (QID) | INTRAMUSCULAR | Status: DC | PRN

## 2017-11-21 MED ORDER — PROPOFOL 10 MG/ML IV BOLUS
INTRAVENOUS | Status: AC
  Filled 2017-11-21: qty 20

## 2017-11-21 MED ORDER — PROPOFOL 10 MG/ML IV BOLUS
INTRAVENOUS | Status: DC | PRN
  Administered 2017-11-21: 180 mg via INTRAVENOUS

## 2017-11-21 MED ORDER — PHENYLEPHRINE 40 MCG/ML (10ML) SYRINGE FOR IV PUSH (FOR BLOOD PRESSURE SUPPORT)
PREFILLED_SYRINGE | INTRAVENOUS | Status: AC
  Filled 2017-11-21: qty 10

## 2017-11-21 MED ORDER — LIDOCAINE HCL (PF) 1 % IJ SOLN
INTRAMUSCULAR | Status: AC
  Filled 2017-11-21: qty 30

## 2017-11-21 MED ORDER — BUPIVACAINE-EPINEPHRINE (PF) 0.5% -1:200000 IJ SOLN
INTRAMUSCULAR | Status: DC | PRN
  Administered 2017-11-21: 5 mL via PERINEURAL

## 2017-11-21 MED ORDER — BISACODYL 10 MG RE SUPP
10.0000 mg | Freq: Every day | RECTAL | Status: DC
  Administered 2017-11-21: 10 mg via RECTAL
  Filled 2017-11-21: qty 1

## 2017-11-21 MED ORDER — SUCCINYLCHOLINE CHLORIDE 200 MG/10ML IV SOSY
PREFILLED_SYRINGE | INTRAVENOUS | Status: DC | PRN
  Administered 2017-11-21: 140 mg via INTRAVENOUS

## 2017-11-21 MED ORDER — METOCLOPRAMIDE HCL 5 MG/ML IJ SOLN
10.0000 mg | Freq: Four times a day (QID) | INTRAMUSCULAR | Status: DC | PRN
  Administered 2017-11-21: 10 mg via INTRAVENOUS
  Filled 2017-11-21: qty 2

## 2017-11-21 MED ORDER — ONDANSETRON HCL 4 MG/2ML IJ SOLN
INTRAMUSCULAR | Status: AC
  Filled 2017-11-21: qty 2

## 2017-11-21 MED ORDER — LIDOCAINE 2% (20 MG/ML) 5 ML SYRINGE
INTRAMUSCULAR | Status: AC
  Filled 2017-11-21: qty 5

## 2017-11-21 MED ORDER — PROMETHAZINE HCL 25 MG/ML IJ SOLN: 6.2500 mg | INTRAMUSCULAR | Status: DC | PRN

## 2017-11-21 MED ORDER — DIATRIZOATE MEGLUMINE & SODIUM 66-10 % PO SOLN
90.0000 mL | Freq: Once | ORAL | Status: DC
  Filled 2017-11-21 (×2): qty 90

## 2017-11-21 MED ORDER — LIP MEDEX EX OINT
1.0000 "application " | TOPICAL_OINTMENT | Freq: Two times a day (BID) | CUTANEOUS | Status: DC
  Administered 2017-11-21 – 2017-11-27 (×12): 1 via TOPICAL
  Filled 2017-11-21 (×2): qty 7

## 2017-11-21 MED ORDER — PROCHLORPERAZINE EDISYLATE 5 MG/ML IJ SOLN
5.0000 mg | INTRAMUSCULAR | Status: DC | PRN
  Administered 2017-11-21: 5 mg via INTRAVENOUS
  Filled 2017-11-21: qty 2

## 2017-11-21 MED ORDER — LACTATED RINGERS IV SOLN
INTRAVENOUS | Status: DC | PRN
  Administered 2017-11-21: 12:00:00 via INTRAVENOUS

## 2017-11-21 MED ORDER — ONDANSETRON HCL 4 MG/2ML IJ SOLN
INTRAMUSCULAR | Status: DC | PRN
  Administered 2017-11-21: 4 mg via INTRAVENOUS

## 2017-11-21 MED ORDER — CEFAZOLIN SODIUM-DEXTROSE 2-4 GM/100ML-% IV SOLN
INTRAVENOUS | Status: AC
  Filled 2017-11-21: qty 100

## 2017-11-21 MED ORDER — MORPHINE SULFATE (PF) 2 MG/ML IV SOLN
2.0000 mg | INTRAVENOUS | Status: DC | PRN
  Administered 2017-11-21 – 2017-11-22 (×4): 2 mg via INTRAVENOUS
  Filled 2017-11-21 (×5): qty 1

## 2017-11-21 MED ORDER — OXYCODONE HCL 5 MG/5ML PO SOLN: 5.0000 mg | Freq: Once | ORAL | Status: DC | PRN

## 2017-11-21 MED ORDER — LIDOCAINE HCL 2 % EX GEL
1.0000 "application " | Freq: Once | CUTANEOUS | Status: DC | PRN
  Filled 2017-11-21: qty 5
  Filled 2017-11-21: qty 11

## 2017-11-21 MED ORDER — LIDOCAINE 2% (20 MG/ML) 5 ML SYRINGE
INTRAMUSCULAR | Status: DC | PRN
  Administered 2017-11-21: 80 mg via INTRAVENOUS

## 2017-11-21 MED ORDER — LACTATED RINGERS IV BOLUS (SEPSIS)
1000.0000 mL | Freq: Once | INTRAVENOUS | Status: AC
  Administered 2017-11-21: 1000 mL via INTRAVENOUS

## 2017-11-21 MED ORDER — PHENOL 1.4 % MT LIQD: 1.0000 | OROMUCOSAL | Status: DC | PRN

## 2017-11-21 MED ORDER — DEXAMETHASONE SODIUM PHOSPHATE 4 MG/ML IJ SOLN
INTRAMUSCULAR | Status: DC | PRN
  Administered 2017-11-21: 10 mg via INTRAVENOUS

## 2017-11-21 MED ORDER — LACTATED RINGERS IV SOLN
INTRAVENOUS | Status: DC
  Administered 2017-11-21: 12:00:00 via INTRAVENOUS
  Administered 2017-11-21: 1000 mL via INTRAVENOUS

## 2017-11-21 MED ORDER — MAGNESIUM SULFATE 2 GM/50ML IV SOLN
2.0000 g | Freq: Once | INTRAVENOUS | Status: AC
  Administered 2017-11-21: 2 g via INTRAVENOUS
  Filled 2017-11-21: qty 50

## 2017-11-21 MED ORDER — OXYCODONE HCL 5 MG PO TABS: 5.0000 mg | ORAL_TABLET | Freq: Once | ORAL | Status: DC | PRN

## 2017-11-21 MED ORDER — LEVOTHYROXINE SODIUM 100 MCG IV SOLR
37.5000 ug | Freq: Every day | INTRAVENOUS | Status: DC
  Administered 2017-11-22 – 2017-11-26 (×5): 37.5 ug via INTRAVENOUS
  Filled 2017-11-21 (×6): qty 5

## 2017-11-21 MED ORDER — CEFAZOLIN SODIUM-DEXTROSE 2-3 GM-%(50ML) IV SOLR
INTRAVENOUS | Status: DC | PRN
  Administered 2017-11-21: 2 g via INTRAVENOUS

## 2017-11-21 MED ORDER — SODIUM CHLORIDE 0.9 % IV SOLN: INTRAVENOUS | Status: DC

## 2017-11-21 MED ORDER — ROCURONIUM BROMIDE 10 MG/ML (PF) SYRINGE
PREFILLED_SYRINGE | INTRAVENOUS | Status: DC | PRN
  Administered 2017-11-21: 10 mg via INTRAVENOUS
  Administered 2017-11-21: 40 mg via INTRAVENOUS

## 2017-11-21 SURGICAL SUPPLY — 60 items
ADH SKN CLS APL DERMABOND .7 (GAUZE/BANDAGES/DRESSINGS) ×1
APPLIER CLIP 5 13 M/L LIGAMAX5 (MISCELLANEOUS)
APPLIER CLIP ROT 10 11.4 M/L (STAPLE)
APR CLP MED LRG 11.4X10 (STAPLE)
APR CLP MED LRG 5 ANG JAW (MISCELLANEOUS)
BLADE EXTENDED COATED 6.5IN (ELECTRODE) IMPLANT
BLADE HEX COATED 2.75 (ELECTRODE) ×3 IMPLANT
CABLE HIGH FREQUENCY MONO STRZ (ELECTRODE) ×3 IMPLANT
CELLS DAT CNTRL 66122 CELL SVR (MISCELLANEOUS) IMPLANT
CLIP APPLIE 5 13 M/L LIGAMAX5 (MISCELLANEOUS) IMPLANT
CLIP APPLIE ROT 10 11.4 M/L (STAPLE) IMPLANT
DECANTER SPIKE VIAL GLASS SM (MISCELLANEOUS) ×3 IMPLANT
DERMABOND ADVANCED (GAUZE/BANDAGES/DRESSINGS) ×2
DERMABOND ADVANCED .7 DNX12 (GAUZE/BANDAGES/DRESSINGS) IMPLANT
DISSECTOR BLUNT TIP ENDO 5MM (MISCELLANEOUS) IMPLANT
DRAPE LAPAROSCOPIC ABDOMINAL (DRAPES) ×3 IMPLANT
ELECT REM PT RETURN 15FT ADLT (MISCELLANEOUS) ×3 IMPLANT
GAUZE SPONGE 4X4 12PLY STRL (GAUZE/BANDAGES/DRESSINGS) ×3 IMPLANT
GLOVE BIOGEL PI IND STRL 7.0 (GLOVE) ×1 IMPLANT
GLOVE BIOGEL PI INDICATOR 7.0 (GLOVE) ×2
GLOVE ECLIPSE 8.0 STRL XLNG CF (GLOVE) ×6 IMPLANT
GLOVE INDICATOR 8.0 STRL GRN (GLOVE) ×6 IMPLANT
GOWN STRL REUS W/ TWL XL LVL3 (GOWN DISPOSABLE) ×3 IMPLANT
GOWN STRL REUS W/TWL LRG LVL3 (GOWN DISPOSABLE) ×3 IMPLANT
GOWN STRL REUS W/TWL XL LVL3 (GOWN DISPOSABLE) ×9
LEGGING LITHOTOMY PAIR STRL (DRAPES) IMPLANT
NS IRRIG 1000ML POUR BTL (IV SOLUTION) ×6 IMPLANT
PACK COLON (CUSTOM PROCEDURE TRAY) ×3 IMPLANT
PAD POSITIONING PINK XL (MISCELLANEOUS) IMPLANT
POSITIONER SURGICAL ARM (MISCELLANEOUS) IMPLANT
RETRACTOR WND ALEXIS 18 MED (MISCELLANEOUS) IMPLANT
RTRCTR WOUND ALEXIS 18CM MED (MISCELLANEOUS)
SCISSORS LAP 5X35 DISP (ENDOMECHANICALS) ×3 IMPLANT
SEALER TISSUE X1 CVD JAW (INSTRUMENTS) IMPLANT
SET IRRIG TUBING LAPAROSCOPIC (IRRIGATION / IRRIGATOR) IMPLANT
SHEARS HARMONIC ACE PLUS 36CM (ENDOMECHANICALS) IMPLANT
SLEEVE XCEL OPT CAN 5 100 (ENDOMECHANICALS) IMPLANT
SOLUTION ANTI FOG 6CC (MISCELLANEOUS) ×3 IMPLANT
STAPLER VISISTAT 35W (STAPLE) ×3 IMPLANT
SUT PDS AB 1 CTX 36 (SUTURE) IMPLANT
SUT PDS AB 1 TP1 96 (SUTURE) IMPLANT
SUT PROLENE 2 0 SH DA (SUTURE) IMPLANT
SUT SILK 2 0 (SUTURE) ×3
SUT SILK 2 0 SH CR/8 (SUTURE) ×3 IMPLANT
SUT SILK 2-0 18XBRD TIE 12 (SUTURE) ×1 IMPLANT
SUT SILK 3 0 (SUTURE) ×3
SUT SILK 3 0 SH CR/8 (SUTURE) ×3 IMPLANT
SUT SILK 3-0 18XBRD TIE 12 (SUTURE) ×1 IMPLANT
SUT VICRYL 2 0 18  UND BR (SUTURE) ×2
SUT VICRYL 2 0 18 UND BR (SUTURE) ×1 IMPLANT
SYS LAPSCP GELPORT 120MM (MISCELLANEOUS)
SYSTEM LAPSCP GELPORT 120MM (MISCELLANEOUS) IMPLANT
TAPE CLOTH 4X10 WHT NS (GAUZE/BANDAGES/DRESSINGS) IMPLANT
TRAY FOLEY W/METER SILVER 16FR (SET/KITS/TRAYS/PACK) ×3 IMPLANT
TROCAR BLADELESS OPT 5 100 (ENDOMECHANICALS) IMPLANT
TROCAR XCEL BLUNT TIP 100MML (ENDOMECHANICALS) IMPLANT
TROCAR XCEL NON-BLD 11X100MML (ENDOMECHANICALS) IMPLANT
TROCAR XCEL UNIV SLVE 11M 100M (ENDOMECHANICALS) IMPLANT
TUBING INSUF HEATED (TUBING) ×3 IMPLANT
YANKAUER SUCT BULB TIP NO VENT (SUCTIONS) ×3 IMPLANT

## 2017-11-21 NOTE — H&P (View-Only) (Signed)
[facility] [place], [place] [address] Phone: [phone] FAX: [phone]     Debbie Brandt  Nov 29, 1960 [id number]  CARE TEAM:  PCP: Lujean Amel, MD  Outpatient Care Team: Patient Care Team: Lujean Amel, MD as PCP - General (Family Medicine)  Inpatient Treatment Team: Treatment Team: Attending Provider: Dorie Rank, MD; Registered Nurse: Talmage Coin, RN; Respiratory Therapist: Nelly Laurence, RRT; Registered Nurse: Leanna Battles, RN; Consulting Physician: Edison Pace, Md, MD   This patient is a 57 y.o.female who presents today for surgical evaluation at the request of Dorie Rank.   Chief complaint / Reason for evaluation: Recurrent crampy and nausea and vomiting.  Suspicious for recurrent small bowel obstruction.  Possible trapped closed-loop.  Pleasant 57 year old female with prior abdominal surgery.  Had bowel obstruction due to band going down the right adnexa that required open lysis adhesions February 2018 by Dr. Zella Richer who is since retired.  She recently came in the hospital last month for recurrent bowel obstruction.  Was managed conservatively on small bowel protocol with nasogastric tube.  She was in the hospital over a week.  There is discussion about surgery but then she gradually opened up after large bowel movements and felt better.  Unfortunately she had recurrent crampy abdominal pain and comes to the ER less than a week from being discharged.  Crampy pain with eating.  Came to emergency room.  CT scan concern for trapped closed-loop like bowel obstruction.  No perforation or necrosis.  Nasogastric tube placed.  Feeling better but not normal.  No other family available.  No personal nor family history of GI/colon cancer, inflammatory bowel disease, irritable bowel syndrome, allergy such as Celiac Sprue, dietary/dairy problems, colitis, ulcers nor gastritis.  No recent sick  contacts/gastroenteritis.  No travel outside the country.  No changes in diet.  No dysphagia to solids or liquids.  No significant heartburn or reflux.  No hematochezia, hematemesis, coffee ground emesis.  No evidence of prior gastric/peptic ulceration.    Assessment  Debbie Brandt  57 y.o. 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    Plan:  I am concerned that this will not improve nonoperatively and she would benefit from surgery.  We will do small bowel protocol.  If her pain does not resolve her x-rays do not show good improvement, operative exploration this admission.  If her pain intensifies or she declines, more emergently done.  Have tentatively put her on for tomorrow afternoon in the event that she does not improve by then.  Dr Barry Dienes to re-evaluate later this AM.  Nasogastric tube with tip barely in stomach.  Advance further.  Discussed with emergency room nurse.  -VTE prophylaxis- SCDs, etc -mobilize as tolerated to help recovery  45 minutes spent in review, evaluation, examination, counseling, and coordination of care.  More than 50% of that time was spent in counseling.  Adin Hector, M.D., F.A.C.S. Gastrointestinal and Minimally Invasive Surgery [facility] [address], [place], [place] [address] [phone] Main / Paging   11/21/2017      Past Medical History:  Diagnosis Date  .  Chest pain 09/26/2015  . Essential hypertension 09/26/2015  . GERD (gastroesophageal reflux disease)   . Hypertension   . Hypothyroidism 09/26/2015  . Murmur 09/26/2015  . Thyroid disease     Past Surgical History:  Procedure  Laterality Date  . ABDOMINAL HYSTERECTOMY    . CESAREAN SECTION    . LAPAROTOMY N/A 10/28/2016   Procedure: EXPLORATORY LAPAROTOMY WITH  LYSIS OF ADHESIONS;  Surgeon: Todd Rosenbower, MD;  Location: [facility];  Service: General;  Laterality: N/A;    Social History   Socioeconomic History  . Marital status: Widowed    Spouse name: Not on file  . Number of children: Not on file  . Years of education: Not on file  . Highest education level: Not on file  Social Needs  . Financial resource strain: Not on file  . Food insecurity - worry: Not on file  . Food insecurity - inability: Not on file  . Transportation needs - medical: Not on file  . Transportation needs - non-medical: Not on file  Occupational History  . Not on file  Tobacco Use  . Smoking status: Never Smoker  . Smokeless tobacco: Never Used  Substance and Sexual Activity  . Alcohol use: No    Alcohol/week: 0.0 oz  . Drug use: No  . Sexual activity: Not on file  Other Topics Concern  . Not on file  Social History Narrative   Epworth Sleepiness Scale = 6 (as of 09/26/2015)    Family History  Problem Relation Age of Onset  . Cancer Mother        lung cancer  . Hypertension Mother   . Cancer Father   . Heart attack Father   . Mental illness Sister   . Hypertension Sister   . Hyperlipidemia Sister   . Diabetes Brother   . Hypertension Brother     Current Facility-Administered Medications  Medication Dose Route Frequency Provider Last Rate Last Dose  . alum & mag hydroxide-simeth (MAALOX/MYLANTA) 200-200-20 MG/5ML suspension 30 mL  30 mL Oral Q6H PRN , , MD      . bisacodyl (DULCOLAX) suppository 10 mg  10 mg Rectal Daily , , MD      . diatrizoate meglumine-sodium (GASTROGRAFIN) 66-10 % solution 90 mL  90 mL Per NG tube Once , , MD      . guaiFENesin-dextromethorphan (ROBITUSSIN DM) 100-10 MG/5ML syrup 10 mL  10 mL Oral Q4H PRN , , MD      . hydrocortisone (ANUSOL-HC) 2.5  % rectal cream 1 application  1 application Topical QID PRN , , MD      . hydrocortisone cream 1 % 1 application  1 application Topical TID PRN , , MD      . lactated ringers bolus 1,000 mL  1,000 mL Intravenous Once , , MD      . lactated ringers bolus 1,000 mL  1,000 mL Intravenous Q8H PRN , , MD      . lip balm (CARMEX) ointment 1 application  1 application Topical BID , , MD      . magic mouthwash  15 mL Oral QID PRN , , MD      . menthol-cetylpyridinium (CEPACOL) lozenge 3 mg  1 lozenge Oral PRN , , MD      . metoCLOPramide (REGLAN) injection 10 mg  10 mg Intravenous Q6H PRN , , MD      . phenol (CHLORASEPTIC) mouth spray 1-2 spray  1-2 spray Mouth/Throat PRN , ,   MD      . prochlorperazine (COMPAZINE) injection 5-10 mg  5-10 mg Intravenous Q4H PRN , , MD      . sodium chloride 0.9 % injection            Current Outpatient Medications  Medication Sig Dispense Refill  . Calcium Carbonate-Vitamin D 600-400 MG-UNIT per tablet Take 1 tablet by mouth daily.    . levothyroxine (SYNTHROID, LEVOTHROID) 75 MCG tablet Take 75 mcg by mouth daily before breakfast.    . losartan-hydrochlorothiazide (HYZAAR) 50-12.5 MG tablet Take 1 tablet by mouth daily.    . MULTIPLE VITAMIN PO Take 1 tablet by mouth daily.    . omeprazole (PRILOSEC) 20 MG capsule Take 1 capsule (20 mg total) by mouth daily. 30 capsule 0  . TRAVATAN Z 0.004 % SOLN ophthalmic solution Place 1 drop into the right eye daily at 6 PM.     . acetaminophen 325 MG tablet Plain Tylenol, acetaminophen:  You can take 2 tablets every 4 hours as needed.  Do not exceed 4000 mg per day. (Patient not taking: Reported on 11/20/2017)    . docusate sodium (COLACE) 100 MG capsule Take 1 capsule (100 mg total) by mouth 2 (two) times daily. (Patient not taking: Reported on 11/20/2017) 10 capsule 0  . triamterene-hydrochlorothiazide (MAXZIDE-25) 37.5-25  MG tablet See you primary care physician. Have them recheck your blood pressure and decide when to resume this medication. (Patient taking differently: Take 1 tablet by mouth daily. See you primary care physician. Have them recheck your blood pressure and decide when to resume this medication.)       No Known Allergies  ROS:   All other systems reviewed & are negative except per HPI or as noted below: Constitutional:  No fevers, chills, sweats.  Weight stable Eyes:  No vision changes, No discharge HENT:  No sore throats, nasal drainage Lymph: No neck swelling, No bruising easily Pulmonary:  No cough, productive sputum CV: No orthopnea, PND  Patient walks 20 minutes for about 0.5 miles without difficulty.  No exertional chest/neck/shoulder/arm pain. GI: No personal nor family history of GI/colon cancer, inflammatory bowel disease, irritable bowel syndrome, allergy such as Celiac Sprue, dietary/dairy problems, colitis, ulcers nor gastritis.  No recent sick contacts/gastroenteritis.  No travel outside the country.  No changes in diet. Renal: No UTIs, No hematuria Genital:  No drainage, bleeding, masses Musculoskeletal: No severe joint pain.  Good ROM major joints Skin:  No sores or lesions.  No rashes Heme/Lymph:  No easy bleeding.  No swollen lymph nodes Neuro: No focal weakness/numbness.  No seizures Psych: No suicidal ideation.  No hallucinations  BP (!) 144/59   Pulse (!) 58   Temp (!) 97.5 F (36.4 C) (Oral)   Resp 20   SpO2 99%   Physical Exam: General: Pt awake/alert/oriented x4 in no major acute distress.  Tired but not sickly Eyes: PERRL, normal EOM. Sclera nonicteric Neuro: CN II-XII intact w/o focal sensory/motor deficits. Lymph: No head/neck/groin lymphadenopathy Psych:  No delerium/psychosis/paranoia HENT: Normocephalic, Mucus membranes moist.  No thrush Neck: Supple, No tracheal deviation Chest: No pain.  Good respiratory excursion. CV:  Pulses intact.  Regular  rhythm Abdomen: Soft, Nondistended.  TTP lower abdomen mild.  Upper abdomen nontender.  No peritonitis.  No guarding.  No incarcerated hernias. Gen:  No inguinal hernias.  No inguinal lymphadenopathy.   Ext:  SCDs BLE.  No significant edema.  No cyanosis Skin: No petechiae / purpurea.  No major sores   Musculoskeletal: No severe joint pain.  Good ROM major joints   Results:   Labs: Results for orders placed or performed during the hospital encounter of 11/20/17 (from the past 48 hour(s))  Lipase, blood     Status: None   Collection Time: 11/20/17  2:07 PM  Result Value Ref Range   Lipase 26 11 - 51 U/L    Comment: Performed at [facility], [place] [address], [place], [place] [address]  Comprehensive metabolic panel     Status: Abnormal   Collection Time: 11/20/17  2:07 PM  Result Value Ref Range   Sodium 141 135 - 145 mmol/L   Potassium 3.9 3.5 - 5.1 mmol/L   Chloride 103 101 - 111 mmol/L   CO2 28 22 - 32 mmol/L   Glucose, Bld 112 (H) 65 - 99 mg/dL   BUN 13 6 - 20 mg/dL   Creatinine, Ser 0.82 0.44 - 1.00 mg/dL   Calcium 9.6 8.9 - 10.3 mg/dL   Total Protein 7.6 6.5 - 8.1 g/dL   Albumin 4.1 3.5 - 5.0 g/dL   AST 18 15 - 41 U/L   ALT 17 14 - 54 U/L   Alkaline Phosphatase 63 38 - 126 U/L   Total Bilirubin 0.5 0.3 - 1.2 mg/dL   GFR calc non Af Amer >60 >60 mL/min   GFR calc Af Amer >60 >60 mL/min    Comment: (NOTE) The eGFR has been calculated using the CKD EPI equation. This calculation has not been validated in all clinical situations. eGFR's persistently <60 mL/min signify possible Chronic Kidney Disease.    Anion gap 10 5 - 15    Comment: Performed at [facility], [place] [address], [place], [place] [address]  CBC     Status: Abnormal   Collection Time: 11/20/17  2:07 PM  Result Value Ref Range   WBC 4.7 4.0 - 10.5 K/uL   RBC 4.88 3.87 - 5.11 MIL/uL   Hemoglobin 13.8 12.0 - 15.0 g/dL   HCT 42.1 36.0 - 46.0 %   MCV 86.3 78.0 - 100.0 fL    MCH 28.3 26.0 - 34.0 pg   MCHC 32.8 30.0 - 36.0 g/dL   RDW 13.4 11.5 - 15.5 %   Platelets 574 (H) 150 - 400 K/uL    Comment: Performed at [facility], [place] [address], [place], [place] [address]  I-Stat beta hCG blood, ED     Status: Abnormal   Collection Time: 11/20/17  2:23 PM  Result Value Ref Range   I-stat hCG, quantitative 19.7 (H) <5 mIU/mL   Comment 3            Comment:   GEST. AGE      CONC.  (mIU/mL)   <=1 WEEK        5 - 50     2 WEEKS       50 - 500     3 WEEKS       100 - 10,000     4 WEEKS     1,000 - 30,000        FEMALE AND NON-PREGNANT FEMALE:     LESS THAN 5 mIU/mL   Urinalysis, Routine w reflex microscopic     Status: Abnormal   Collection Time: 11/20/17  8:17 PM  Result Value Ref Range   Color, Urine YELLOW YELLOW   APPearance CLEAR CLEAR   Specific Gravity, Urine 1.020 1.005 - 1.030   pH 7.0  5.0 - 8.0   Glucose, UA NEGATIVE NEGATIVE mg/dL   Hgb urine dipstick NEGATIVE NEGATIVE   Bilirubin Urine NEGATIVE NEGATIVE   Ketones, ur 20 (A) NEGATIVE mg/dL   Protein, ur NEGATIVE NEGATIVE mg/dL   Nitrite NEGATIVE NEGATIVE   Leukocytes, UA NEGATIVE NEGATIVE    Comment: Performed at [facility], [address] [place], [place] [address]    Imaging / Studies: Dg Abdomen 1 View  Result Date: 11/06/2017 CLINICAL DATA:  Evaluate nasogastric tube placement. EXAM: ABDOMEN - 1 VIEW COMPARISON:  Abdominal radiograph October 28, 2016 and CT abdomen and pelvis October 16, 2017 FINDINGS: Nasogastric tube courses in distribution stomach with distal tip projecting in mid to distal duodenum. Included abdomen demonstrates mildly distended small bowel. Stool and air in the included large bowel. IMPRESSION: Nasogastric tube tip projecting in mid to distal duodenum. Included abdomen demonstrates mildly prominent small bowel, possible early small bowel obstruction. Electronically Signed   By: Courtnay  Bloomer M.D.   On: 11/06/2017 23:04   Ct  Abdomen Pelvis W Contrast  Result Date: 11/20/2017 CLINICAL DATA:  Abdominal pain EXAM: CT ABDOMEN AND PELVIS WITH CONTRAST TECHNIQUE: Multidetector CT imaging of the abdomen and pelvis was performed using the standard protocol following bolus administration of intravenous contrast. CONTRAST:  100mL ISOVUE-300 IOPAMIDOL (ISOVUE-300) INJECTION 61% COMPARISON:  Radiograph 228 2019, CT 10/16/2017, 10/23/2016, 10/22/2016 FINDINGS: Lower chest: Lung bases demonstrate no acute consolidation or pleural effusion. Borderline heart size. Hepatobiliary: No focal liver abnormality is seen. No gallstones, gallbladder wall thickening, or biliary dilatation. Pancreas: Unremarkable. No pancreatic ductal dilatation or surrounding inflammatory changes. Spleen: Normal in size without focal abnormality. Adrenals/Urinary Tract: Adrenal glands are within normal limits. No hydronephrosis. Cyst in the upper pole of the left kidney. Bladder unremarkable. Stomach/Bowel: Moderate enlargement of the stomach. Dilated loops of small bowel measuring up to 4.2 cm with fecalized segment of dilated small bowel in the mid pelvis. Transition point appears to be associated with crowded appearance of mesenteric vasculature with suggested mesenteric swirling pattern, series 2, image number 52. No colon wall thickening. Normal appendix. Distal small bowel is decompressed. Vascular/Lymphatic: Nonaneurysmal aorta. No significantly enlarged lymph nodes. Reproductive: Status post hysterectomy. No adnexal masses. Other: Negative for free air or free fluid. Musculoskeletal: Degenerative changes. No acute or suspicious lesion. IMPRESSION: 1. Dilated loops of small bowel with fecalized segment of small bowel in the right pelvis, consistent with mechanical small bowel obstruction. Transition point appears to be within the upper pelvis where there is mesenteric vascular crowding and slight swirled appearance of the vasculature, either representing volvulus or  obstruction related to internal hernia and raising concern for closed loop obstruction. 2. Otherwise no significant interval change compared to prior studies. Electronically Signed   By: Kim  Fujinaga M.D.   On: 11/20/2017 23:27   Dg Abd 2 Views  Result Date: 11/11/2017 CLINICAL DATA:  Abdominal distention.  Constipation. EXAM: ABDOMEN - 2 VIEW COMPARISON:  11/09/2017.  CT 10/16/2017. FINDINGS: Interval removal of NG tube. Distended loops of small bowel are noted. Similar findings noted on prior exam. Oral contrast in the colon. These findings suggest partial small bowel obstruction. No free air. Pelvic calcifications consistent phleboliths. No acute bony abnormality. IMPRESSION: 1.  Interim removal of NG tube. 2. Persistent distended loops of small bowel. Similar findings noted on prior exam. Oral contrast in the colon. These findings are consistent with partial small bowel obstruction. Continued follow-up exams to demonstrate resolution suggested. Electronically Signed   By: Thomas    Register   On: 11/11/2017 11:58   Dg Abd Portable 1v  Result Date: 11/13/2017 CLINICAL DATA:  Small-bowel obstruction EXAM: PORTABLE ABDOMEN - 1 VIEW COMPARISON:  11/11/2017. FINDINGS: Persistent but improved distention of small bowel loops. Oral contrast again noted the colon. Colon is nondistended. No free air. Pelvic calcification consistent phlebolith. No acute bony abnormality. IMPRESSION: Persistent but improved distention of small bowel loops. Colonic gas pattern is normal. Oral contrast in the colon. Continued follow-up exams suggested to demonstrate resolution of the residual small bowel distention. Electronically Signed   By: Thomas  Register   On: 11/13/2017 07:19   Dg Abd Portable 1v  Result Date: 11/09/2017 CLINICAL DATA:  Abdominal pain EXAM: PORTABLE ABDOMEN - 1 VIEW COMPARISON:  T 12/03/2017 FINDINGS: Nasogastric tube tip remains in the gastric antrum region. Previously administered contrast remains within  the right colon. Mild prominence of the small bowel gas pattern which could go along with mild ileus or partial small bowel obstruction. Not definitely abnormal however. IMPRESSION: Nasogastric tube tip in the gastric antrum. No progression of the hyperdense material in the right colon. Slight prominence of the proximal small bowel which could be within normal limits or mildly abnormal as above. Electronically Signed   By: Mark  Shogry M.D.   On: 11/09/2017 06:28   Dg Abd Portable 1v  Result Date: 11/08/2017 CLINICAL DATA:  Nasogastric position. EXAM: PORTABLE ABDOMEN - 1 VIEW COMPARISON:  11/07/2017 FINDINGS: Nasogastric tube tip is in the antrum or pylorus region. Bowel gas pattern is normal. Previously administered contrast remains within the right colon. IMPRESSION: Nasogastric tube tip in the antrum or pylorus. Electronically Signed   By: Mark  Shogry M.D.   On: 11/08/2017 06:46   Dg Abd Portable 1v-small Bowel Obstruction Protocol-initial, 8 Hr Delay  Result Date: 11/07/2017 CLINICAL DATA:  8 hour delay, bowel obstruction EXAM: PORTABLE ABDOMEN - 1 VIEW COMPARISON:  11/06/2017, CT 10/16/2017 FINDINGS: Tip of the esophageal tube projects over the upper abdomen. Some retained contrast within pelvic small bowel loops. There is continued mild gaseous enlargement of left abdominal small bowel loops up to 3.6 cm. There is contrast present in the right colon and appendix. Calcified pelvic phleboliths. IMPRESSION: 1. Contrast in the right colon with mild retained contrast within pelvic small bowel loops 2. There is persistent mild dilatation of left abdominal small bowel, query mild ileus Electronically Signed   By: Kim  Fujinaga M.D.   On: 11/07/2017 18:44    Medications / Allergies: per chart  Antibiotics: Anti-infectives (From admission, onward)   None        Note: Portions of this report may have been transcribed using voice recognition software. Every effort was made to ensure accuracy;  however, inadvertent computerized transcription errors may be present.   Any transcriptional errors that result from this process are unintentional.     C. , M.D., F.A.C.S. Gastrointestinal and Minimally Invasive Surgery [facility] [address], [address] [place], [place] [address] [phone] Main / Paging   11/21/2017 

## 2017-11-21 NOTE — H&P (Signed)
History and Physical    Debbie FlemingsCynthia L Copelin WUJ:[id number] DOB: July 03, 1961 DOA: 11/20/2017  PCP: Darrow BussingKoirala, Dibas, MD   Patient coming from: Home  Chief Complaint: Abdominal Pain  HPI: Debbie Brandt is a 57 y.o. female with medical history significant for hypothyroidism, HTN, small bowel obstruction status post lysis of adhesions Feb 2018, who presented to the ED with complaints of abdominal pain that started yesterday.  Patient denies nausea, vomiting, or abdominal distention.  Last bowel movement was about 2 days ago.  Recent hospital admission to the hospitalist service 2/21- 11/14/17-managed for SBO.  Surgery was consulted on admission. Repeat abdominal imaging prior to discharge showed improvement.  Patient reports she went home felt better but symptoms returned. No dysuria or frequency no fever or chills.  ED Course: Stable vitals.  Elevated platelets 574 otherwise BMP, Cr, CBC unremarkable lipase 26 UA clean.  CT abdomen and pelvis with contrast showed dilated loops of small bowel with fecalized segment of small bowel in the right pelvis consistent with mechanical small bowel obstruction, with a concern for closed loop obstruction.  General surgery was consulted in the ED, effective management, NG tube at this time, recommended hospitalist admission.  Review of Systems: As per HPI otherwise 10 point review of systems negative.  Past Medical History:  Diagnosis Date  . Chest pain 09/26/2015  . Essential hypertension 09/26/2015  . GERD (gastroesophageal reflux disease)   . Hypertension   . Hypothyroidism 09/26/2015  . Murmur 09/26/2015  . Thyroid disease     Past Surgical History:  Procedure Laterality Date  . ABDOMINAL HYSTERECTOMY    . CESAREAN SECTION    . LAPAROTOMY N/A 10/28/2016   Procedure: EXPLORATORY LAPAROTOMY WITH  LYSIS OF ADHESIONS;  Surgeon: Avel Peaceodd Rosenbower, MD;  Location: [facility];  Service: General;  Laterality: N/A;     reports that  has never smoked. she has never used  smokeless tobacco. She reports that she does not drink alcohol or use drugs.  No Known Allergies  Family History  Problem Relation Age of Onset  . Cancer Mother        lung cancer  . Hypertension Mother   . Cancer Father   . Heart attack Father   . Mental illness Sister   . Hypertension Sister   . Hyperlipidemia Sister   . Diabetes Brother   . Hypertension Brother     Prior to Admission medications   Medication Sig Start Date End Date Taking? Authorizing Provider  Calcium Carbonate-Vitamin D 600-400 MG-UNIT per tablet Take 1 tablet by mouth daily.   Yes [provider]  levothyroxine (SYNTHROID, LEVOTHROID) 75 MCG tablet Take 75 mcg by mouth daily before breakfast.   Yes [provider]  losartan-hydrochlorothiazide (HYZAAR) 50-12.5 MG tablet Take 1 tablet by mouth daily.   Yes [provider]  MULTIPLE VITAMIN PO Take 1 tablet by mouth daily.   Yes [provider]  omeprazole (PRILOSEC) 20 MG capsule Take 1 capsule (20 mg total) by mouth daily. 03/09/16  Yes Palumbo, April, MD  TRAVATAN Z 0.004 % SOLN ophthalmic solution Place 1 drop into the right eye daily at 6 PM.  12/10/15  Yes [provider]  acetaminophen 325 MG tablet Plain Tylenol, acetaminophen:  You can take 2 tablets every 4 hours as needed.  Do not exceed 4000 mg per day. Patient not taking: Reported on 11/20/2017 11/04/16   Sherrie GeorgeJennings, Willard, PA-C  docusate sodium (COLACE) 100 MG capsule Take 1 capsule (100 mg total) by  mouth 2 (two) times daily. Patient not taking: Reported on 11/20/2017 11/14/17   Kathlen Mody, MD  triamterene-hydrochlorothiazide (MAXZIDE-25) 37.5-25 MG tablet See you primary care physician. Have them recheck your blood pressure and decide when to resume this medication. Patient taking differently: Take 1 tablet by mouth daily. See you primary care physician. Have them recheck your blood pressure and decide when to resume this medication. 11/04/16   Sherrie George, PA-C    Physical Exam: Vitals:   11/20/17 2130 11/20/17 2230 11/21/17 0130 11/21/17 0200  BP: (!) 163/63 (!) 144/59 (!) 154/61 (!) 143/61  Pulse: 60 (!) 58 68 63  Resp: 20 20    Temp:      TempSrc:      SpO2: 99% 99% 100% 99%    Constitutional:  calm, comfortable, NG tube in place Vitals:   11/20/17 2130 11/20/17 2230 11/21/17 0130 11/21/17 0200  BP: (!) 163/63 (!) 144/59 (!) 154/61 (!) 143/61  Pulse: 60 (!) 58 68 63  Resp: 20 20    Temp:      TempSrc:      SpO2: 99% 99% 100% 99%   Eyes: PERRL, lids and conjunctivae normal ENMT: Mucous membranes are moist. Posterior pharynx clear of any exudate or lesions.Normal dentition.  Neck: normal, supple, no masses, no thyromegaly Respiratory: clear to auscultation bilaterally, no wheezing, no crackles. Normal respiratory effort. No accessory muscle use.  Cardiovascular: Regular rate and rhythm, no murmurs / rubs / gallops. No extremity edema. 2+ pedal pulses. No carotid bruits.  Abdomen: Mild diffuse abdominal tenderness, no masses palpated. No hepatosplenomegaly. Bowel sounds positive, likely [facility].. Musculoskeletal: no clubbing / cyanosis. No joint deformity upper and lower extremities. Good ROM, no contractures. Normal muscle tone.  Skin: no rashes, lesions, ulcers. No induration Neurologic: CN 2-12 grossly intact. Sensation intact, DTR normal. Strength 5/5 in all 4.  Psychiatric: Normal judgment and insight. Alert and oriented x 3. Normal mood.   Labs on Admission: I have personally reviewed following labs and imaging studies  CBC: Recent Labs  Lab 11/20/17 1407  WBC 4.7  HGB 13.8  HCT 42.1  MCV 86.3  PLT 574*   Basic Metabolic Panel: Recent Labs  Lab 11/20/17 1407  NA 141  K 3.9  CL 103  CO2 28  GLUCOSE 112*  BUN 13  CREATININE 0.82  CALCIUM 9.6   Liver Function Tests: Recent Labs  Lab 11/20/17 1407  AST 18  ALT 17  ALKPHOS 63  BILITOT 0.5  PROT 7.6  ALBUMIN 4.1   Recent Labs  Lab  11/20/17 1407  LIPASE 26   Urine analysis:    Component Value Date/Time   COLORURINE YELLOW 11/20/2017 2017   APPEARANCEUR CLEAR 11/20/2017 2017   LABSPEC 1.020 11/20/2017 2017   PHURINE 7.0 11/20/2017 2017   GLUCOSEU NEGATIVE 11/20/2017 2017   HGBUR NEGATIVE 11/20/2017 2017   BILIRUBINUR NEGATIVE 11/20/2017 2017   KETONESUR 20 (A) 11/20/2017 2017   PROTEINUR NEGATIVE 11/20/2017 2017   UROBILINOGEN 0.2 04/15/2008 1541   NITRITE NEGATIVE 11/20/2017 2017   LEUKOCYTESUR NEGATIVE 11/20/2017 2017    Radiological Exams on Admission: Ct Abdomen Pelvis W Contrast  Result Date: 11/20/2017 CLINICAL DATA:  Abdominal pain EXAM: CT ABDOMEN AND PELVIS WITH CONTRAST TECHNIQUE: Multidetector CT imaging of the abdomen and pelvis was performed using the standard protocol following bolus administration of intravenous contrast. CONTRAST:  ISOVUE-300 IOPAMIDOL (ISOVUE-300) INJECTION 61% COMPARISON:  Radiograph 228 2019, CT 10/16/2017, 10/23/2016, 10/22/2016 FINDINGS: Lower chest: Lung bases demonstrate  no acute consolidation or pleural effusion. Borderline heart size. Hepatobiliary: No focal liver abnormality is seen. No gallstones, gallbladder wall thickening, or biliary dilatation. Pancreas: Unremarkable. No pancreatic ductal dilatation or surrounding inflammatory changes. Spleen: Normal in size without focal abnormality. Adrenals/Urinary Tract: Adrenal glands are within normal limits. No hydronephrosis. Cyst in the upper pole of the left kidney. Bladder unremarkable. Stomach/Bowel: Moderate enlargement of the stomach. Dilated loops of small bowel measuring up to 4.2 cm with fecalized segment of dilated small bowel in the mid pelvis. Transition point appears to be associated with crowded appearance of mesenteric vasculature with suggested mesenteric swirling pattern, series 2, image number 52. No colon wall thickening. Normal appendix. Distal small bowel is decompressed. Vascular/Lymphatic: Nonaneurysmal  aorta. No significantly enlarged lymph nodes. Reproductive: Status post hysterectomy. No adnexal masses. Other: Negative for free air or free fluid. Musculoskeletal: Degenerative changes. No acute or suspicious lesion. IMPRESSION: 1. Dilated loops of small bowel with fecalized segment of small bowel in the right pelvis, consistent with mechanical small bowel obstruction. Transition point appears to be within the upper pelvis where there is mesenteric vascular crowding and slight swirled appearance of the vasculature, either representing volvulus or obstruction related to internal hernia and raising concern for closed loop obstruction. 2. Otherwise no significant interval change compared to prior studies. Electronically Signed   By: Jasmine Pang M.D.   On: 11/20/2017 23:27   Dg Abd Portable 1v-small Bowel Protocol-position Verification  Result Date: 11/21/2017 CLINICAL DATA:  NG tube placement EXAM: PORTABLE ABDOMEN - 1 VIEW COMPARISON:  CT 11/20/2017, radiograph 11/13/2017 FINDINGS: Esophageal tube tip is in the left upper quadrant, side-port projects over the GE junction. Dilated loops of small bowel measuring up to 4.6 cm. Contrast within the renal collecting systems and bladder. IMPRESSION: 1. Esophageal tube tip projects over the stomach, side-port at the GE junction, suggest further advancement for more optimal positioning 2. Dilated loops of small bowel in the left abdomen concerning for a bowel obstruction Electronically Signed   By: Jasmine Pang M.D.   On: 11/21/2017 01:50    EKG: None   Assessment/Plan Principal Problem:   SBO (small bowel obstruction) (HCC) Active Problems:   HTN (hypertension)   Hypothyroidism   Small bowel obstruction due to adhesions (HCC)  SBO-recurrent.  Recent prolonged hospital admission for same.  CT abdomen-SBO, concern for closed loop obstruction.  History of adhesio lysis Feb 2018. 1L LR given per gen surg. - NPO  -Surgery recommendations appreciated - BMP,  Mag a.m -On Continue Ringer's lactate ordered by general surgery  Hypothyroidism -Continue home Synthroid as IV  HTN-hold home p.o. Antihypertensives -PRN IV hydralazine  DVT prophylaxis: SCDs, Wll hold off on pharmacologic DVT prophylaxis pending surgery eval Code Status: Full Family Communication: Brother- James at bedside  disposition Plan: >2 days Consults called: General surgery, Dr. Michaell Cowing. Admission status: Inpt, med surg   Onnie Boer MD [facility] Pager [phone] From 6PM-2AM.  Otherwise please check [email or web address]. [email or web address] Password [email or web address]  11/21/2017, 2:17 AM

## 2017-11-21 NOTE — Progress Notes (Signed)
I have seen and assessed patient and I agree with Dr. Mariea ClontsEmokpae assessment and plan.  Patient is a 57 year old female history of hypothyroidism, hypertension, small bowel obstruction status post lysis of adhesions February 2018 presented back to the ED with complaints of abdominal pain.  Last bowel movement 2 days prior to admission.  Patient recently hospitalized November 06, 2017 to November 14, 2017 with small bowel obstruction which was managed conservatively.  Patient presented back with recurrent small bowel obstruction.  CT abdomen and pelvis consistent with small bowel obstruction with concern for closed loop obstruction.  Patient seen by general surgery.  Patient to the OR today per general surgery.  We will try to keep electrolytes repleted by keeping magnesium greater than 2 and potassium greater than 4.  IV fluids.  Supportive care.  Per general surgery.  No charge.

## 2017-11-21 NOTE — Anesthesia Postprocedure Evaluation (Signed)
Anesthesia Post Note  Patient: Debbie Brandt  Procedure(s) Performed: LAPAROSCOPIC ABDOMINAL EXPLORATION LYSIS OF ADHESIONS (N/A )     Patient location during evaluation: PACU Anesthesia Type: General Level of consciousness: awake and alert Pain management: pain level controlled Vital Signs Assessment: post-procedure vital signs reviewed and stable Respiratory status: spontaneous breathing, nonlabored ventilation and respiratory function stable Cardiovascular status: blood pressure returned to baseline and stable Postop Assessment: no apparent nausea or vomiting Anesthetic complications: no    Last Vitals:  Vitals:   11/21/17 1530 11/21/17 1558  BP: 129/60 (!) 131/59  Pulse: 76 76  Resp: 12 15  Temp: 36.7 C 36.7 C  SpO2: 100% 100%    Last Pain:  Vitals:   11/21/17 1530  TempSrc:   PainSc: Asleep                 Beryle Lathehomas E Verne Lanuza

## 2017-11-21 NOTE — Progress Notes (Signed)
Patient is still actively vomiting so gastrografin has not yet been administered yet.  Patient was given IV compazine nausea and vomiting, reglan and morphine for pain at this time.

## 2017-11-21 NOTE — Transfer of Care (Deleted)
Immediate Anesthesia Transfer of Care Note  Patient: Debbie FlemingsCynthia L Brandt  Procedure(s) Performed: LAPAROSCOPIC ABDOMINAL EXPLORATION LYSIS OF ADHESIONS (N/A )  Patient Location: PACU  Anesthesia Type:General  Level of Consciousness: sedated and patient cooperative  Airway & Oxygen Therapy: Patient Spontanous Breathing and Patient connected to face mask  Post-op Assessment: Report given to RN and Post -op Vital signs reviewed and stable  Post vital signs: Reviewed and stable  Last Vitals:  Vitals:   11/21/17 0426 11/21/17 0930  BP: (!) 183/68 (!) 140/59  Pulse: 80   Resp: 18   Temp: 36.9 C   SpO2: 100%     Last Pain:  Vitals:   11/21/17 0841  TempSrc:   PainSc: 0-No pain         Complications: No apparent anesthesia complications

## 2017-11-21 NOTE — ED Notes (Signed)
This RN and AdministratorJake RN both attempted to insert NG tube unsuccessfully. EDP and charge RN notified

## 2017-11-21 NOTE — Interval H&P Note (Signed)
History and Physical Interval Note:  11/21/2017 11:14 AM  Debbie Brandt  has presented today for surgery, with the diagnosis of Adhesions  The various methods of treatment have been discussed with the patient and family. After consideration of risks, benefits and other options for treatment, the patient has consented to  Procedure(s): LAPAROSCOPIC ABDOMINAL EXPLORATION LYSIS OF ADHESIONS (N/A) as a surgical intervention .  The patient's history has been reviewed, patient examined, no change in status, stable for surgery.  I have reviewed the patient's chart and labs.  Questions were answered to the patient's satisfaction.     Almond LintFaera Olene Godfrey

## 2017-11-21 NOTE — ED Notes (Signed)
[facility] Pt can be transported at 0330

## 2017-11-21 NOTE — Anesthesia Preprocedure Evaluation (Addendum)
Anesthesia Evaluation  Patient identified by MRN, date of birth, ID band Patient awake    Reviewed: Allergy & Precautions, NPO status , Patient's Chart, lab work & pertinent test results  Airway Mallampati: II  TM Distance: >3 FB Neck ROM: Full    Dental no notable dental hx. (+) Dental Advisory Given   Pulmonary neg pulmonary ROS,    Pulmonary exam normal breath sounds clear to auscultation       Cardiovascular hypertension, Pt. on medications Normal cardiovascular exam+ Valvular Problems/Murmurs MR  Rhythm:Regular Rate:Normal  '17 TTE - EF was in the range of 60% to 65%. Mild MR. Aneurysmal IAS - PFO cannot be excluded. Mild TR. PASP 24 mm Hg   Neuro/Psych negative neurological ROS  negative psych ROS   GI/Hepatic Neg liver ROS, GERD  Medicated,NGT in place; SBO   Endo/Other  Hypothyroidism Obesity  Renal/GU negative Renal ROS  negative genitourinary   Musculoskeletal negative musculoskeletal ROS (+)   Abdominal   Peds negative pediatric ROS (+)  Hematology negative hematology ROS (+)   Anesthesia Other Findings   Reproductive/Obstetrics negative OB ROS                            Anesthesia Physical  Anesthesia Plan  ASA: II  Anesthesia Plan: General   Post-op Pain Management:    Induction: Intravenous, Rapid sequence and Cricoid pressure planned  PONV Risk Score and Plan: 4 or greater and Treatment may vary due to age or medical condition, Ondansetron, Dexamethasone, Midazolam and Scopolamine patch - Pre-op  Airway Management Planned: Oral ETT and Video Laryngoscope Planned  Additional Equipment: None  Intra-op Plan:   Post-operative Plan: Extubation in OR  Informed Consent: I have reviewed the patients History and Physical, chart, labs and discussed the procedure including the risks, benefits and alternatives for the proposed anesthesia with the patient or authorized  representative who has indicated his/her understanding and acceptance.   Dental advisory given  Plan Discussed with: CRNA  Anesthesia Plan Comments:        Anesthesia Quick Evaluation

## 2017-11-21 NOTE — Consult Note (Signed)
  [facility]  [address]  [place], [place] [address] Phone: [phone] FAX: [phone]     Debbie Brandt  07/27/1961 [id number]  CARE TEAM:  PCP: Koirala, Dibas, MD  Outpatient Care Team: Patient Care Team: Koirala, Dibas, MD as PCP - General (Family Medicine)  Inpatient Treatment Team: Treatment Team: Attending Provider: Knapp, Jon, MD; Registered Nurse: Vaughn, Logan H, RN; Respiratory Therapist: Knapp, Tabatha L, RRT; Registered Nurse: Mingia, Kristen A, RN; Consulting Physician: Ccs, Md, MD   This patient is a 56 y.o.female who presents today for surgical evaluation at the request of Jon Knapp.   Chief complaint / Reason for evaluation: Recurrent crampy and nausea and vomiting.  Suspicious for recurrent small bowel obstruction.  Possible trapped closed-loop.  Pleasant 56-year-old female with prior abdominal surgery.  Had bowel obstruction due to band going down the right adnexa that required open lysis adhesions February 2018 by Dr. Rosenbower who is since retired.  She recently came in the hospital last month for recurrent bowel obstruction.  Was managed conservatively on small bowel protocol with nasogastric tube.  She was in the hospital over a week.  There is discussion about surgery but then she gradually opened up after large bowel movements and felt better.  Unfortunately she had recurrent crampy abdominal pain and comes to the ER less than a week from being discharged.  Crampy pain with eating.  Came to emergency room.  CT scan concern for trapped closed-loop like bowel obstruction.  No perforation or necrosis.  Nasogastric tube placed.  Feeling better but not normal.  No other family available.  No personal nor family history of GI/colon cancer, inflammatory bowel disease, irritable bowel syndrome, allergy such as Celiac Sprue, dietary/dairy problems, colitis, ulcers nor gastritis.  No recent sick  contacts/gastroenteritis.  No travel outside the country.  No changes in diet.  No dysphagia to solids or liquids.  No significant heartburn or reflux.  No hematochezia, hematemesis, coffee ground emesis.  No evidence of prior gastric/peptic ulceration.    Assessment  Debbie Brandt  56 y.o. 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    Plan:  I am concerned that this will not improve nonoperatively and she would benefit from surgery.  We will do small bowel protocol.  If her pain does not resolve her x-rays do not show good improvement, operative exploration this admission.  If her pain intensifies or she declines, more emergently done.  Have tentatively put her on for tomorrow afternoon in the event that she does not improve by then.  Dr Byerly to re-evaluate later this AM.  Nasogastric tube with tip barely in stomach.  Advance further.  Discussed with emergency room nurse.  -VTE prophylaxis- SCDs, etc -mobilize as tolerated to help recovery  45 minutes spent in review, evaluation, examination, counseling, and coordination of care.  More than 50% of that time was spent in counseling.  Latina Frank C. Rosalie Buenaventura, M.D., F.A.C.S. Gastrointestinal and Minimally Invasive Surgery [facility] [address] [place], [place] [address] [phone] Main / Paging   11/21/2017      Past Medical History:  Diagnosis Date  .   Chest pain 09/26/2015  . Essential hypertension 09/26/2015  . GERD (gastroesophageal reflux disease)   . Hypertension   . Hypothyroidism 09/26/2015  . Murmur 09/26/2015  . Thyroid disease     Past Surgical History:  Procedure  Laterality Date  . ABDOMINAL HYSTERECTOMY    . CESAREAN SECTION    . LAPAROTOMY N/A 10/28/2016   Procedure: EXPLORATORY LAPAROTOMY WITH  LYSIS OF ADHESIONS;  Surgeon: Todd Rosenbower, MD;  Location: [facility];  Service: General;  Laterality: N/A;    Social History   Socioeconomic History  . Marital status: Widowed    Spouse name: Not on file  . Number of children: Not on file  . Years of education: Not on file  . Highest education level: Not on file  Social Needs  . Financial resource strain: Not on file  . Food insecurity - worry: Not on file  . Food insecurity - inability: Not on file  . Transportation needs - medical: Not on file  . Transportation needs - non-medical: Not on file  Occupational History  . Not on file  Tobacco Use  . Smoking status: Never Smoker  . Smokeless tobacco: Never Used  Substance and Sexual Activity  . Alcohol use: No    Alcohol/week: 0.0 oz  . Drug use: No  . Sexual activity: Not on file  Other Topics Concern  . Not on file  Social History Narrative   Epworth Sleepiness Scale = 6 (as of 09/26/2015)    Family History  Problem Relation Age of Onset  . Cancer Mother        lung cancer  . Hypertension Mother   . Cancer Father   . Heart attack Father   . Mental illness Sister   . Hypertension Sister   . Hyperlipidemia Sister   . Diabetes Brother   . Hypertension Brother     Current Facility-Administered Medications  Medication Dose Route Frequency Provider Last Rate Last Dose  . alum & mag hydroxide-simeth (MAALOX/MYLANTA) 200-200-20 MG/5ML suspension 30 mL  30 mL Oral Q6H PRN Nan Maya, MD      . bisacodyl (DULCOLAX) suppository 10 mg  10 mg Rectal Daily Anwar Sakata, MD      . diatrizoate meglumine-sodium (GASTROGRAFIN) 66-10 % solution 90 mL  90 mL Per NG tube Once Mikolaj Woolstenhulme, MD      . guaiFENesin-dextromethorphan (ROBITUSSIN DM) 100-10 MG/5ML syrup 10 mL  10 mL Oral Q4H PRN Kinnie Kaupp, MD      . hydrocortisone (ANUSOL-HC) 2.5  % rectal cream 1 application  1 application Topical QID PRN Larue Lightner, MD      . hydrocortisone cream 1 % 1 application  1 application Topical TID PRN Quinnten Calvin, MD      . lactated ringers bolus 1,000 mL  1,000 mL Intravenous Once Lugene Hitt, MD      . lactated ringers bolus 1,000 mL  1,000 mL Intravenous Q8H PRN Haruna Rohlfs, MD      . lip balm (CARMEX) ointment 1 application  1 application Topical BID Burle Kwan, MD      . magic mouthwash  15 mL Oral QID PRN Pat Elicker, MD      . menthol-cetylpyridinium (CEPACOL) lozenge 3 mg  1 lozenge Oral PRN Valencia Kassa, MD      . metoCLOPramide (REGLAN) injection 10 mg  10 mg Intravenous Q6H PRN Zehra Rucci, MD      . phenol (CHLORASEPTIC) mouth spray 1-2 spray  1-2 spray Mouth/Throat PRN Glenola Wheat,   MD      . prochlorperazine (COMPAZINE) injection 5-10 mg  5-10 mg Intravenous Q4H PRN Narmeen Kerper, MD      . sodium chloride 0.9 % injection            Current Outpatient Medications  Medication Sig Dispense Refill  . Calcium Carbonate-Vitamin D 600-400 MG-UNIT per tablet Take 1 tablet by mouth daily.    . levothyroxine (SYNTHROID, LEVOTHROID) 75 MCG tablet Take 75 mcg by mouth daily before breakfast.    . losartan-hydrochlorothiazide (HYZAAR) 50-12.5 MG tablet Take 1 tablet by mouth daily.    . MULTIPLE VITAMIN PO Take 1 tablet by mouth daily.    . omeprazole (PRILOSEC) 20 MG capsule Take 1 capsule (20 mg total) by mouth daily. 30 capsule 0  . TRAVATAN Z 0.004 % SOLN ophthalmic solution Place 1 drop into the right eye daily at 6 PM.     . acetaminophen 325 MG tablet Plain Tylenol, acetaminophen:  You can take 2 tablets every 4 hours as needed.  Do not exceed 4000 mg per day. (Patient not taking: Reported on 11/20/2017)    . docusate sodium (COLACE) 100 MG capsule Take 1 capsule (100 mg total) by mouth 2 (two) times daily. (Patient not taking: Reported on 11/20/2017) 10 capsule 0  . triamterene-hydrochlorothiazide (MAXZIDE-25) 37.5-25  MG tablet See you primary care physician. Have them recheck your blood pressure and decide when to resume this medication. (Patient taking differently: Take 1 tablet by mouth daily. See you primary care physician. Have them recheck your blood pressure and decide when to resume this medication.)       No Known Allergies  ROS:   All other systems reviewed & are negative except per HPI or as noted below: Constitutional:  No fevers, chills, sweats.  Weight stable Eyes:  No vision changes, No discharge HENT:  No sore throats, nasal drainage Lymph: No neck swelling, No bruising easily Pulmonary:  No cough, productive sputum CV: No orthopnea, PND  Patient walks 20 minutes for about 0.5 miles without difficulty.  No exertional chest/neck/shoulder/arm pain. GI: No personal nor family history of GI/colon cancer, inflammatory bowel disease, irritable bowel syndrome, allergy such as Celiac Sprue, dietary/dairy problems, colitis, ulcers nor gastritis.  No recent sick contacts/gastroenteritis.  No travel outside the country.  No changes in diet. Renal: No UTIs, No hematuria Genital:  No drainage, bleeding, masses Musculoskeletal: No severe joint pain.  Good ROM major joints Skin:  No sores or lesions.  No rashes Heme/Lymph:  No easy bleeding.  No swollen lymph nodes Neuro: No focal weakness/numbness.  No seizures Psych: No suicidal ideation.  No hallucinations  BP (!) 144/59   Pulse (!) 58   Temp (!) 97.5 F (36.4 C) (Oral)   Resp 20   SpO2 99%   Physical Exam: General: Pt awake/alert/oriented x4 in no major acute distress.  Tired but not sickly Eyes: PERRL, normal EOM. Sclera nonicteric Neuro: CN II-XII intact w/o focal sensory/motor deficits. Lymph: No head/neck/groin lymphadenopathy Psych:  No delerium/psychosis/paranoia HENT: Normocephalic, Mucus membranes moist.  No thrush Neck: Supple, No tracheal deviation Chest: No pain.  Good respiratory excursion. CV:  Pulses intact.  Regular  rhythm Abdomen: Soft, Nondistended.  TTP lower abdomen mild.  Upper abdomen nontender.  No peritonitis.  No guarding.  No incarcerated hernias. Gen:  No inguinal hernias.  No inguinal lymphadenopathy.   Ext:  SCDs BLE.  No significant edema.  No cyanosis Skin: No petechiae / purpurea.  No major sores   Musculoskeletal: No severe joint pain.  Good ROM major joints   Results:   Labs: Results for orders placed or performed during the hospital encounter of 11/20/17 (from the past 48 hour(s))  Lipase, blood     Status: None   Collection Time: 11/20/17  2:07 PM  Result Value Ref Range   Lipase 26 11 - 51 U/L    Comment: Performed at [facility], [place] [address], [place], [place] [address]  Comprehensive metabolic panel     Status: Abnormal   Collection Time: 11/20/17  2:07 PM  Result Value Ref Range   Sodium 141 135 - 145 mmol/L   Potassium 3.9 3.5 - 5.1 mmol/L   Chloride 103 101 - 111 mmol/L   CO2 28 22 - 32 mmol/L   Glucose, Bld 112 (H) 65 - 99 mg/dL   BUN 13 6 - 20 mg/dL   Creatinine, Ser 0.82 0.44 - 1.00 mg/dL   Calcium 9.6 8.9 - 10.3 mg/dL   Total Protein 7.6 6.5 - 8.1 g/dL   Albumin 4.1 3.5 - 5.0 g/dL   AST 18 15 - 41 U/L   ALT 17 14 - 54 U/L   Alkaline Phosphatase 63 38 - 126 U/L   Total Bilirubin 0.5 0.3 - 1.2 mg/dL   GFR calc non Af Amer >60 >60 mL/min   GFR calc Af Amer >60 >60 mL/min    Comment: (NOTE) The eGFR has been calculated using the CKD EPI equation. This calculation has not been validated in all clinical situations. eGFR's persistently <60 mL/min signify possible Chronic Kidney Disease.    Anion gap 10 5 - 15    Comment: Performed at [facility], [place] [address] [place], [place] [address]  CBC     Status: Abnormal   Collection Time: 11/20/17  2:07 PM  Result Value Ref Range   WBC 4.7 4.0 - 10.5 K/uL   RBC 4.88 3.87 - 5.11 MIL/uL   Hemoglobin 13.8 12.0 - 15.0 g/dL   HCT 42.1 36.0 - 46.0 %   MCV 86.3 78.0 - 100.0 fL    MCH 28.3 26.0 - 34.0 pg   MCHC 32.8 30.0 - 36.0 g/dL   RDW 13.4 11.5 - 15.5 %   Platelets 574 (H) 150 - 400 K/uL    Comment: Performed at [facility], [place] [address], [place], [place] [address]  I-Stat beta hCG blood, ED     Status: Abnormal   Collection Time: 11/20/17  2:23 PM  Result Value Ref Range   I-stat hCG, quantitative 19.7 (H) <5 mIU/mL   Comment 3            Comment:   GEST. AGE      CONC.  (mIU/mL)   <=1 WEEK        5 - 50     2 WEEKS       50 - 500     3 WEEKS       100 - 10,000     4 WEEKS     1,000 - 30,000        FEMALE AND NON-PREGNANT FEMALE:     LESS THAN 5 mIU/mL   Urinalysis, Routine w reflex microscopic     Status: Abnormal   Collection Time: 11/20/17  8:17 PM  Result Value Ref Range   Color, Urine YELLOW YELLOW   APPearance CLEAR CLEAR   Specific Gravity, Urine 1.020 1.005 - 1.030   pH 7.0  5.0 - 8.0   Glucose, UA NEGATIVE NEGATIVE mg/dL   Hgb urine dipstick NEGATIVE NEGATIVE   Bilirubin Urine NEGATIVE NEGATIVE   Ketones, ur 20 (A) NEGATIVE mg/dL   Protein, ur NEGATIVE NEGATIVE mg/dL   Nitrite NEGATIVE NEGATIVE   Leukocytes, UA NEGATIVE NEGATIVE    Comment: Performed at [facility], [address] [place], [place] [address]    Imaging / Studies: Dg Abdomen 1 View  Result Date: 11/06/2017 CLINICAL DATA:  Evaluate nasogastric tube placement. EXAM: ABDOMEN - 1 VIEW COMPARISON:  Abdominal radiograph October 28, 2016 and CT abdomen and pelvis October 16, 2017 FINDINGS: Nasogastric tube courses in distribution stomach with distal tip projecting in mid to distal duodenum. Included abdomen demonstrates mildly distended small bowel. Stool and air in the included large bowel. IMPRESSION: Nasogastric tube tip projecting in mid to distal duodenum. Included abdomen demonstrates mildly prominent small bowel, possible early small bowel obstruction. Electronically Signed   By: Courtnay  Bloomer M.D.   On: 11/06/2017 23:04   Ct  Abdomen Pelvis W Contrast  Result Date: 11/20/2017 CLINICAL DATA:  Abdominal pain EXAM: CT ABDOMEN AND PELVIS WITH CONTRAST TECHNIQUE: Multidetector CT imaging of the abdomen and pelvis was performed using the standard protocol following bolus administration of intravenous contrast. CONTRAST:  100mL ISOVUE-300 IOPAMIDOL (ISOVUE-300) INJECTION 61% COMPARISON:  Radiograph 228 2019, CT 10/16/2017, 10/23/2016, 10/22/2016 FINDINGS: Lower chest: Lung bases demonstrate no acute consolidation or pleural effusion. Borderline heart size. Hepatobiliary: No focal liver abnormality is seen. No gallstones, gallbladder wall thickening, or biliary dilatation. Pancreas: Unremarkable. No pancreatic ductal dilatation or surrounding inflammatory changes. Spleen: Normal in size without focal abnormality. Adrenals/Urinary Tract: Adrenal glands are within normal limits. No hydronephrosis. Cyst in the upper pole of the left kidney. Bladder unremarkable. Stomach/Bowel: Moderate enlargement of the stomach. Dilated loops of small bowel measuring up to 4.2 cm with fecalized segment of dilated small bowel in the mid pelvis. Transition point appears to be associated with crowded appearance of mesenteric vasculature with suggested mesenteric swirling pattern, series 2, image number 52. No colon wall thickening. Normal appendix. Distal small bowel is decompressed. Vascular/Lymphatic: Nonaneurysmal aorta. No significantly enlarged lymph nodes. Reproductive: Status post hysterectomy. No adnexal masses. Other: Negative for free air or free fluid. Musculoskeletal: Degenerative changes. No acute or suspicious lesion. IMPRESSION: 1. Dilated loops of small bowel with fecalized segment of small bowel in the right pelvis, consistent with mechanical small bowel obstruction. Transition point appears to be within the upper pelvis where there is mesenteric vascular crowding and slight swirled appearance of the vasculature, either representing volvulus or  obstruction related to internal hernia and raising concern for closed loop obstruction. 2. Otherwise no significant interval change compared to prior studies. Electronically Signed   By: Kim  Fujinaga M.D.   On: 11/20/2017 23:27   Dg Abd 2 Views  Result Date: 11/11/2017 CLINICAL DATA:  Abdominal distention.  Constipation. EXAM: ABDOMEN - 2 VIEW COMPARISON:  11/09/2017.  CT 10/16/2017. FINDINGS: Interval removal of NG tube. Distended loops of small bowel are noted. Similar findings noted on prior exam. Oral contrast in the colon. These findings suggest partial small bowel obstruction. No free air. Pelvic calcifications consistent phleboliths. No acute bony abnormality. IMPRESSION: 1.  Interim removal of NG tube. 2. Persistent distended loops of small bowel. Similar findings noted on prior exam. Oral contrast in the colon. These findings are consistent with partial small bowel obstruction. Continued follow-up exams to demonstrate resolution suggested. Electronically Signed   By: Thomas    Register   On: 11/11/2017 11:58   Dg Abd Portable 1v  Result Date: 11/13/2017 CLINICAL DATA:  Small-bowel obstruction EXAM: PORTABLE ABDOMEN - 1 VIEW COMPARISON:  11/11/2017. FINDINGS: Persistent but improved distention of small bowel loops. Oral contrast again noted the colon. Colon is nondistended. No free air. Pelvic calcification consistent phlebolith. No acute bony abnormality. IMPRESSION: Persistent but improved distention of small bowel loops. Colonic gas pattern is normal. Oral contrast in the colon. Continued follow-up exams suggested to demonstrate resolution of the residual small bowel distention. Electronically Signed   By: Thomas  Register   On: 11/13/2017 07:19   Dg Abd Portable 1v  Result Date: 11/09/2017 CLINICAL DATA:  Abdominal pain EXAM: PORTABLE ABDOMEN - 1 VIEW COMPARISON:  T 12/03/2017 FINDINGS: Nasogastric tube tip remains in the gastric antrum region. Previously administered contrast remains within  the right colon. Mild prominence of the small bowel gas pattern which could go along with mild ileus or partial small bowel obstruction. Not definitely abnormal however. IMPRESSION: Nasogastric tube tip in the gastric antrum. No progression of the hyperdense material in the right colon. Slight prominence of the proximal small bowel which could be within normal limits or mildly abnormal as above. Electronically Signed   By: Mark  Shogry M.D.   On: 11/09/2017 06:28   Dg Abd Portable 1v  Result Date: 11/08/2017 CLINICAL DATA:  Nasogastric position. EXAM: PORTABLE ABDOMEN - 1 VIEW COMPARISON:  11/07/2017 FINDINGS: Nasogastric tube tip is in the antrum or pylorus region. Bowel gas pattern is normal. Previously administered contrast remains within the right colon. IMPRESSION: Nasogastric tube tip in the antrum or pylorus. Electronically Signed   By: Mark  Shogry M.D.   On: 11/08/2017 06:46   Dg Abd Portable 1v-small Bowel Obstruction Protocol-initial, 8 Hr Delay  Result Date: 11/07/2017 CLINICAL DATA:  8 hour delay, bowel obstruction EXAM: PORTABLE ABDOMEN - 1 VIEW COMPARISON:  11/06/2017, CT 10/16/2017 FINDINGS: Tip of the esophageal tube projects over the upper abdomen. Some retained contrast within pelvic small bowel loops. There is continued mild gaseous enlargement of left abdominal small bowel loops up to 3.6 cm. There is contrast present in the right colon and appendix. Calcified pelvic phleboliths. IMPRESSION: 1. Contrast in the right colon with mild retained contrast within pelvic small bowel loops 2. There is persistent mild dilatation of left abdominal small bowel, query mild ileus Electronically Signed   By: Kim  Fujinaga M.D.   On: 11/07/2017 18:44    Medications / Allergies: per chart  Antibiotics: Anti-infectives (From admission, onward)   None        Note: Portions of this report may have been transcribed using voice recognition software. Every effort was made to ensure accuracy;  however, inadvertent computerized transcription errors may be present.   Any transcriptional errors that result from this process are unintentional.    Jaedyn Lard C. Elizabeth Haff, M.D., F.A.C.S. Gastrointestinal and Minimally Invasive Surgery [facility] [address] [place], [place] [address] [phone] Main / Paging   11/21/2017 

## 2017-11-21 NOTE — Anesthesia Procedure Notes (Signed)
Procedure Name: Intubation Date/Time: 11/21/2017 12:34 PM Performed by: Vanessa Durhamochran, Shantese Raven Glenn, CRNA Pre-anesthesia Checklist: Emergency Drugs available, Suction available, Patient identified and Patient being monitored Patient Re-evaluated:Patient Re-evaluated prior to induction Oxygen Delivery Method: Circle system utilized Preoxygenation: Pre-oxygenation with 100% oxygen Induction Type: IV induction and Rapid sequence Laryngoscope Size: Glidescope Grade View: Grade I Tube type: Oral Tube size: 7.0 mm Number of attempts: 2 Airway Equipment and Method: Video-laryngoscopy Placement Confirmation: ETT inserted through vocal cords under direct vision,  positive ETCO2 and breath sounds checked- equal and bilateral Tube secured with: Tape Dental Injury: Teeth and Oropharynx as per pre-operative assessment

## 2017-11-21 NOTE — Transfer of Care (Signed)
Immediate Anesthesia Transfer of Care Note  Patient: Debbie Brandt  Procedure(s) Performed: LAPAROSCOPIC ABDOMINAL EXPLORATION LYSIS OF ADHESIONS (N/A )  Patient Location: PACU  Anesthesia Type:General  Level of Consciousness: awake, alert , oriented and patient cooperative  Airway & Oxygen Therapy: Patient Spontanous Breathing and Patient connected to face mask  Post-op Assessment: Report given to RN and Post -op Vital signs reviewed and stable  Post vital signs: Reviewed and stable  Last Vitals:  Vitals:   11/21/17 1134 11/21/17 1445  BP: (!) 141/64 (!) 147/61  Pulse: 85 (P) 86  Resp: 18 (P) 12  Temp: 36.9 C (P) 36.8 C  SpO2: 97% (P) 100%    Last Pain:  Vitals:   11/21/17 1445  TempSrc:   PainSc: (P) 0-No pain      Patients Stated Pain Goal: 4 (11/21/17 1134)  Complications: No apparent anesthesia complications

## 2017-11-21 NOTE — Op Note (Signed)
PRE-OPERATIVE DIAGNOSIS: closed loop small bowel obstruction  POST-OPERATIVE DIAGNOSIS:  Same  PROCEDURE:  Procedure(s): Laparoscopic lysis of adhesions  SURGEON:  Surgeon(s): Almond LintFaera Nyanna Heideman, MD  ASSISTANT:  Wells GuilesKelly Rayburn, PA-C  ANESTHESIA:   general and local  DRAINS: none   LOCAL MEDICATIONS USED:  BUPIVICAINE  and LIDOCAINE   SPECIMEN:  No Specimen  DISPOSITION OF SPECIMEN:  N/A  COUNTS:  YES  DICTATION: .Dragon Dictation  PLAN OF CARE: Admit to inpatient   PATIENT DISPOSITION:  PACU - hemodynamically stable.  FINDINGS:  Closed loop obstruction LUQ and additional adhesive obstruction RLQ, small abdominal wall hernia.    EBL: min  PROCEDURE:  Patient was identified in the holding area and taken to the operating room where she was placed supine on the operating table.  General anesthesia was induced.  A Foley catheter was placed.  The abdomen was prepped and draped.  A timeout was performed according to the surgical safety checklist.  When all was correct, we continued.  The patient was placed into reverse Trendelenburg position and rotated to the right.  A 5 mm Optiview port was placed in the left upper quadrant.  The abdomen was insufflated to a pressure of 15 mmHg.  There were adhesions of the omentum to the midline of the abdomen.  2 additional ports were placed in the left lateral abdomen.  Sharp dissection was used to take down the omental adhesions.  The bowel was then run starting in the left upper quadrant.  There was seen very quickly a knotted area of small bowel that was herniated upon itself.  This was tethered but was gently reduced. There was proximal dilated bowel and distal decompressed bowel.    There were several bands that required division in order to adequately reduce the bowel.  This was untwisted.  The bowel was run distally.  There are multiple other bands of adhesion that were present that were divided.  There is an additional area in the right lower  quadrant but also appeared to have a twisted loop of bowel.  These bands were divided and the bowel untwisted.  We made it to the terminal ileum.  The bowel was run back proximally and several additional adhesions were divided.  The bowel was run twice additionally to look for any evidence of leakage.  No mucosa was seen and no succus was seen.  The pelvis was evaluated and there was no evidence of bile or blood pooling in the pelvis.  The bowel was run again and then pulled back down into the pelvis.  The omentum was pulled down into the pelvis as well.    The patient did have a small hernia in her anterior abdominal wall.  There was no evidence of incarceration in this location.  The pneumoperitoneum was allowed to evacuate.  The trocars were removed.  The incisions were then closed using 4-0 Monocryl in subcuticular fashion.  There were clean, dried, and dressed with Dermabond.  The patient was allowed to emerge from anesthesia and was taken to the PACU in stable condition.  Needle, sponge, and instrument counts were correct x2.  Patient tolerated the procedure well.

## 2017-11-22 ENCOUNTER — Encounter (HOSPITAL_COMMUNITY): Payer: Self-pay | Admitting: General Surgery

## 2017-11-22 ENCOUNTER — Inpatient Hospital Stay (HOSPITAL_COMMUNITY): Payer: Managed Care, Other (non HMO)

## 2017-11-22 DIAGNOSIS — E876 Hypokalemia: Secondary | ICD-10-CM

## 2017-11-22 LAB — COMPREHENSIVE METABOLIC PANEL
ALBUMIN: 3.3 g/dL — AB (ref 3.5–5.0)
ALK PHOS: 49 U/L (ref 38–126)
ALT: 13 U/L — ABNORMAL LOW (ref 14–54)
ANION GAP: 6 (ref 5–15)
AST: 19 U/L (ref 15–41)
BUN: 11 mg/dL (ref 6–20)
CALCIUM: 8.4 mg/dL — AB (ref 8.9–10.3)
CHLORIDE: 108 mmol/L (ref 101–111)
CO2: 27 mmol/L (ref 22–32)
Creatinine, Ser: 0.76 mg/dL (ref 0.44–1.00)
GFR calc non Af Amer: >60 mL/min (ref >60)
GLUCOSE: 98 mg/dL (ref 65–99)
POTASSIUM: 4.4 mmol/L (ref 3.5–5.1)
SODIUM: 141 mmol/L (ref 135–145)
Total Bilirubin: 0.3 mg/dL (ref 0.3–1.2)
Total Protein: 6.3 g/dL — ABNORMAL LOW (ref 6.5–8.1)

## 2017-11-22 LAB — CBC WITH DIFFERENTIAL/PLATELET
BASOS PCT: 0 %
Basophils Absolute: 0 10*3/uL (ref 0.0–0.1)
EOS ABS: 0 10*3/uL (ref 0.0–0.7)
EOS PCT: 0 %
HCT: 37.7 % (ref 36.0–46.0)
HEMOGLOBIN: 12 g/dL (ref 12.0–15.0)
Lymphocytes Relative: 16 %
Lymphs Abs: 1.3 10*3/uL (ref 0.7–4.0)
MCH: 28 pg (ref 26.0–34.0)
MCHC: 31.8 g/dL (ref 30.0–36.0)
MCV: 88.1 fL (ref 78.0–100.0)
MONOS PCT: 7 %
Monocytes Absolute: 0.6 10*3/uL (ref 0.1–1.0)
NEUTROS PCT: 77 %
Neutro Abs: 6.1 10*3/uL (ref 1.7–7.7)
PLATELETS: 456 10*3/uL — AB (ref 150–400)
RBC: 4.28 MIL/uL (ref 3.87–5.11)
RDW: 13.8 % (ref 11.5–15.5)
WBC: 8 10*3/uL (ref 4.0–10.5)

## 2017-11-22 LAB — MAGNESIUM: Magnesium: 2.1 mg/dL (ref 1.7–2.4)

## 2017-11-22 MED ORDER — SODIUM CHLORIDE 0.9 % IV SOLN
INTRAVENOUS | Status: DC
  Filled 2017-11-22: qty 1000

## 2017-11-22 MED ORDER — PANTOPRAZOLE SODIUM 40 MG IV SOLR
40.0000 mg | INTRAVENOUS | Status: DC
  Administered 2017-11-22 – 2017-11-27 (×6): 40 mg via INTRAVENOUS
  Filled 2017-11-22 (×6): qty 40

## 2017-11-22 MED ORDER — SODIUM CHLORIDE 0.9 % IV SOLN
INTRAVENOUS | Status: DC
  Administered 2017-11-22 – 2017-11-23 (×4): via INTRAVENOUS

## 2017-11-22 NOTE — Progress Notes (Signed)
PROGRESS NOTE    Debbie Brandt  ZOX:[id number] DOB: [birth year] DOA: 11/20/2017 PCP: Darrow Bussing, MD    Brief Narrative:  Debbie Brandt is a 57 y.o. female with medical history significant for hypothyroidism, HTN, small bowel obstruction status post lysis of adhesions Feb 2018, who presented to the ED with complaints of abdominal pain that started yesterday.  Patient denies nausea, vomiting, or abdominal distention.  Last bowel movement was about 2 days ago.  Recent hospital admission to the hospitalist service 2/21- 11/14/17-managed for SBO.  Surgery was consulted on admission. Repeat abdominal imaging prior to discharge showed improvement.  Patient reports she went home felt better but symptoms returned. No dysuria or frequency no fever or chills.  ED Course: Stable vitals.  Elevated platelets 574 otherwise BMP, Cr, CBC unremarkable lipase 26 UA clean.  CT abdomen and pelvis with contrast showed dilated loops of small bowel with fecalized segment of small bowel in the right pelvis consistent with mechanical small bowel obstruction, with a concern for closed loop obstruction.  General surgery was consulted in the ED, effective management, NG tube at this time, recommended hospitalist admission.      Assessment & Plan:   Principal Problem:   Small bowel obstruction due to adhesions [facility]) Active Problems:   HTN (hypertension)   Hypothyroidism   Small bowel obstruction s/p ex lap & lysis of adhesions 10/28/2016   Hypokalemia  #1 small bowel obstruction Status post laparoscopic abdominal exploration and lysis of adhesions per Dr. Donell Beers 12/01/2017.  NG tube in place.  Patient with no flatus or bowel movements.  Currently n.p.o.  Mobilize.  Place on gentle hydration.  Per general surgery.  2.  Hypertension Blood pressure stable.  Monitor for now.  If further blood pressure control is needed will place on IV Lopressor.  Continue as needed IV hydralazine.  3.  Hypothyroidism Continue  IV Synthroid.  4.  Hypokalemia Repleted.   DVT prophylaxis: SCDs Code Status: Full Family Communication: Updated patient and family at bedside. Disposition Plan: Likely home once bowels are moving, tolerating oral intake and per general surgery.   Consultants:   General surgery: Dr. Michaell Cowing 12/01/2017  Procedures:   Laparoscopic lysis of adhesions by Dr. Donell Beers 11/21/2017  Antimicrobials:   None   Subjective: Laying in bed.  NG tube in place.  But denies any chest pain or shortness of breath.  Denies any nausea or vomiting.  Still with some abdominal discomfort however states better than on admission.  No factors.  No bowel movement.  Objective: Vitals:   11/22/17 1000 11/22/17 1417 11/22/17 1800 11/22/17 2116  BP: (!) 121/58 119/64 125/62 (!) 114/58  Pulse: 73 65 65 72  Resp: 16 16 16 16   Temp: 99 F (37.2 C) 98.6 F (37 C) 99.7 F (37.6 C) 98.9 F (37.2 C)  TempSrc: Oral Oral Oral Oral  SpO2: 96% 98% 97% 98%  Weight:      Height:        Intake/Output Summary (Last 24 hours) at 11/22/2017 2217 Last data filed at 11/22/2017 2026 Gross per 24 hour  Intake 961.67 ml  Output 1676 ml  Net -714.33 ml   Filed Weights   11/21/17 0426  Weight: 76.8 kg (169 lb 5 oz)    Examination:  General exam: Appears calm and comfortable.  NG tube intact Respiratory system: Clear to auscultation. Respiratory effort normal. Cardiovascular system: S1 & S2 heard, RRR. No JVD, murmurs, rubs, gallops or clicks. No pedal edema. Gastrointestinal system:  Abdomen is nondistended, soft and tender to palpation in the left lower quadrant.  Hypoactive bowel sounds.  No rebound.  No guarding.   Central nervous system: Alert and oriented. No focal neurological deficits. Extremities: Symmetric 5 x 5 power. Skin: No rashes, lesions or ulcers Psychiatry: Judgement and insight appear normal. Mood & affect appropriate.     Data Reviewed: I have personally reviewed following labs and imaging  studies  CBC: Recent Labs  Lab 11/20/17 1407 11/22/17 0429  WBC 4.7 8.0  NEUTROABS  --  6.1  HGB 13.8 12.0  HCT 42.1 37.7  MCV 86.3 88.1  PLT 574* 456*   Basic Metabolic Panel: Recent Labs  Lab 11/20/17 1407 11/21/17 0659 11/22/17 0429  NA 141 142 141  K 3.9 3.4* 4.4  CL 103 104 108  CO2 28 29 27   GLUCOSE 112* 128* 98  BUN 13 12 11   CREATININE 0.82 0.76 0.76  CALCIUM 9.6 9.1 8.4*  MG  --  1.9 2.1   GFR: Estimated Creatinine Clearance: 73.6 mL/min (by C-G formula based on SCr of 0.76 mg/dL). Liver Function Tests: Recent Labs  Lab 11/20/17 1407 11/22/17 0429  AST 18 19  ALT 17 13*  ALKPHOS 63 49  BILITOT 0.5 0.3  PROT 7.6 6.3*  ALBUMIN 4.1 3.3*   Recent Labs  Lab 11/20/17 1407  LIPASE 26   No results for input(s): AMMONIA in the last 168 hours. Coagulation Profile: No results for input(s): INR, PROTIME in the last 168 hours. Cardiac Enzymes: No results for input(s): CKTOTAL, CKMB, CKMBINDEX, TROPONINI in the last 168 hours. BNP (last 3 results) No results for input(s): PROBNP in the last 8760 hours. HbA1C: No results for input(s): HGBA1C in the last 72 hours. CBG: No results for input(s): GLUCAP in the last 168 hours. Lipid Profile: No results for input(s): CHOL, HDL, LDLCALC, TRIG, CHOLHDL, LDLDIRECT in the last 72 hours. Thyroid Function Tests: No results for input(s): TSH, T4TOTAL, FREET4, T3FREE, THYROIDAB in the last 72 hours. Anemia Panel: No results for input(s): VITAMINB12, FOLATE, FERRITIN, TIBC, IRON, RETICCTPCT in the last 72 hours. Sepsis Labs: No results for input(s): PROCALCITON, LATICACIDVEN in the last 168 hours.  No results found for this or any previous visit (from the past 240 hour(s)).       Radiology Studies: Ct Abdomen Pelvis W Contrast  Result Date: 11/20/2017 CLINICAL DATA:  Abdominal pain EXAM: CT ABDOMEN AND PELVIS WITH CONTRAST TECHNIQUE: Multidetector CT imaging of the abdomen and pelvis was performed using the  standard protocol following bolus administration of intravenous contrast. CONTRAST:  ISOVUE-300 IOPAMIDOL (ISOVUE-300) INJECTION 61% COMPARISON:  Radiograph 228 2019, CT 10/16/2017, 10/23/2016, 10/22/2016 FINDINGS: Lower chest: Lung bases demonstrate no acute consolidation or pleural effusion. Borderline heart size. Hepatobiliary: No focal liver abnormality is seen. No gallstones, gallbladder wall thickening, or biliary dilatation. Pancreas: Unremarkable. No pancreatic ductal dilatation or surrounding inflammatory changes. Spleen: Normal in size without focal abnormality. Adrenals/Urinary Tract: Adrenal glands are within normal limits. No hydronephrosis. Cyst in the upper pole of the left kidney. Bladder unremarkable. Stomach/Bowel: Moderate enlargement of the stomach. Dilated loops of small bowel measuring up to 4.2 cm with fecalized segment of dilated small bowel in the mid pelvis. Transition point appears to be associated with crowded appearance of mesenteric vasculature with suggested mesenteric swirling pattern, series 2, image number 52. No colon wall thickening. Normal appendix. Distal small bowel is decompressed. Vascular/Lymphatic: Nonaneurysmal aorta. No significantly enlarged lymph nodes. Reproductive: Status post hysterectomy. No adnexal masses.  Other: Negative for free air or free fluid. Musculoskeletal: Degenerative changes. No acute or suspicious lesion. IMPRESSION: 1. Dilated loops of small bowel with fecalized segment of small bowel in the right pelvis, consistent with mechanical small bowel obstruction. Transition point appears to be within the upper pelvis where there is mesenteric vascular crowding and slight swirled appearance of the vasculature, either representing volvulus or obstruction related to internal hernia and raising concern for closed loop obstruction. 2. Otherwise no significant interval change compared to prior studies. Electronically Signed   By: Jasmine PangKim  Fujinaga M.D.   On:  11/20/2017 23:27   Dg Abd Portable 1v  Result Date: 11/22/2017 CLINICAL DATA:  Small bowel obstruction EXAM: PORTABLE ABDOMEN - 1 VIEW COMPARISON:  11/21/2017 FINDINGS: NG tube is in the mid stomach. Gas and stool noted in the colon. Decreasing small bowel distention. No free air organomegaly. Locules of gas noted along the left lateral portion of the image which appear to be within the subcutaneous soft tissues. This was not evident on prior plain film or CT. No acute bony abnormality. IMPRESSION: No current evidence for small bowel obstruction. Gas and stool noted within the colon. NG tube in the mid stomach. Left lateral abdominal wall and lower chest wall subcutaneous emphysema of unknown etiology. Electronically Signed   By: Charlett NoseKevin  Dover M.D.   On: 11/22/2017 07:38   Dg Abd Portable 1v-small Bowel Protocol-position Verification  Result Date: 11/21/2017 CLINICAL DATA:  NG tube placement EXAM: PORTABLE ABDOMEN - 1 VIEW COMPARISON:  CT 11/20/2017, radiograph 11/13/2017 FINDINGS: Esophageal tube tip is in the left upper quadrant, side-port projects over the GE junction. Dilated loops of small bowel measuring up to 4.6 cm. Contrast within the renal collecting systems and bladder. IMPRESSION: 1. Esophageal tube tip projects over the stomach, side-port at the GE junction, suggest further advancement for more optimal positioning 2. Dilated loops of small bowel in the left abdomen concerning for a bowel obstruction Electronically Signed   By: Jasmine PangKim  Fujinaga M.D.   On: 11/21/2017 01:50        Scheduled Meds: . levothyroxine  37.5 mcg Intravenous Daily  . lip balm  1 application Topical BID  . pantoprazole (PROTONIX) IV  40 mg Intravenous Q24H   Continuous Infusions: . sodium chloride 100 mL/hr at 11/22/17 2128     LOS: 1 day    Time spent: 35 minutes    Ramiro Harvestaniel Joana Nolton, MD [facility] Pager [phone]  If 7PM-7AM, please contact night-coverage [email or web address] Password  [facility] [email or web address] 11/22/2017, 10:17 PM

## 2017-11-22 NOTE — Progress Notes (Signed)
1 Day Post-Op lap LOA Subjective: Pain controlled.  No flatus or BM  Objective: Vital signs in last 24 hours: Temp:  [98 F (36.7 C)-99.9 F (37.7 C)] 98.5 F (36.9 C) (03/09 0559) Pulse Rate:  [64-86] 73 (03/09 0559) Resp:  [12-18] 16 (03/09 0559) BP: (109-147)/(56-71) 139/60 (03/09 0559) SpO2:  [97 %-100 %] 100 % (03/09 0559)   Intake/Output from previous day: 03/08 0701 - 03/09 0700 In: 1320 [P.O.:120; I.V.:1200] Out: 2135 [Urine:700; Emesis/NG output:1025; Blood:10] Intake/Output this shift: No intake/output data recorded.   General appearance: alert and cooperative GI: normal findings: soft, mildly distended, appropriately tender  Incision: no significant drainage  Lab Results:  Recent Labs    11/20/17 1407 11/22/17 0429  WBC 4.7 8.0  HGB 13.8 12.0  HCT 42.1 37.7  PLT 574* 456*   BMET Recent Labs    11/21/17 0659 11/22/17 0429  NA 142 141  K 3.4* 4.4  CL 104 108  CO2 29 27  GLUCOSE 128* 98  BUN 12 11  CREATININE 0.76 0.76  CALCIUM 9.1 8.4*   PT/INR No results for input(s): LABPROT, INR in the last 72 hours. ABG No results for input(s): PHART, HCO3 in the last 72 hours.  Invalid input(s): PCO2, PO2  MEDS, Scheduled . levothyroxine  37.5 mcg Intravenous Daily  . lip balm  1 application Topical BID    Studies/Results: Ct Abdomen Pelvis W Contrast  Result Date: 11/20/2017 CLINICAL DATA:  Abdominal pain EXAM: CT ABDOMEN AND PELVIS WITH CONTRAST TECHNIQUE: Multidetector CT imaging of the abdomen and pelvis was performed using the standard protocol following bolus administration of intravenous contrast. CONTRAST:  ISOVUE-300 IOPAMIDOL (ISOVUE-300) INJECTION 61% COMPARISON:  Radiograph 228 2019, CT 10/16/2017, 10/23/2016, 10/22/2016 FINDINGS: Lower chest: Lung bases demonstrate no acute consolidation or pleural effusion. Borderline heart size. Hepatobiliary: No focal liver abnormality is seen. No gallstones, gallbladder wall thickening, or biliary  dilatation. Pancreas: Unremarkable. No pancreatic ductal dilatation or surrounding inflammatory changes. Spleen: Normal in size without focal abnormality. Adrenals/Urinary Tract: Adrenal glands are within normal limits. No hydronephrosis. Cyst in the upper pole of the left kidney. Bladder unremarkable. Stomach/Bowel: Moderate enlargement of the stomach. Dilated loops of small bowel measuring up to 4.2 cm with fecalized segment of dilated small bowel in the mid pelvis. Transition point appears to be associated with crowded appearance of mesenteric vasculature with suggested mesenteric swirling pattern, series 2, image number 52. No colon wall thickening. Normal appendix. Distal small bowel is decompressed. Vascular/Lymphatic: Nonaneurysmal aorta. No significantly enlarged lymph nodes. Reproductive: Status post hysterectomy. No adnexal masses. Other: Negative for free air or free fluid. Musculoskeletal: Degenerative changes. No acute or suspicious lesion. IMPRESSION: 1. Dilated loops of small bowel with fecalized segment of small bowel in the right pelvis, consistent with mechanical small bowel obstruction. Transition point appears to be within the upper pelvis where there is mesenteric vascular crowding and slight swirled appearance of the vasculature, either representing volvulus or obstruction related to internal hernia and raising concern for closed loop obstruction. 2. Otherwise no significant interval change compared to prior studies. Electronically Signed   By: Jasmine Pang M.D.   On: 11/20/2017 23:27   Dg Abd Portable 1v  Result Date: 11/22/2017 CLINICAL DATA:  Small bowel obstruction EXAM: PORTABLE ABDOMEN - 1 VIEW COMPARISON:  11/21/2017 FINDINGS: NG tube is in the mid stomach. Gas and stool noted in the colon. Decreasing small bowel distention. No free air organomegaly. Locules of gas noted along the left lateral portion of  the image which appear to be within the subcutaneous soft tissues. This was not  evident on prior plain film or CT. No acute bony abnormality. IMPRESSION: No current evidence for small bowel obstruction. Gas and stool noted within the colon. NG tube in the mid stomach. Left lateral abdominal wall and lower chest wall subcutaneous emphysema of unknown etiology. Electronically Signed   By: Charlett NoseKevin  Dover M.D.   On: 11/22/2017 07:38   Dg Abd Portable 1v-small Bowel Protocol-position Verification  Result Date: 11/21/2017 CLINICAL DATA:  NG tube placement EXAM: PORTABLE ABDOMEN - 1 VIEW COMPARISON:  CT 11/20/2017, radiograph 11/13/2017 FINDINGS: Esophageal tube tip is in the left upper quadrant, side-port projects over the GE junction. Dilated loops of small bowel measuring up to 4.6 cm. Contrast within the renal collecting systems and bladder. IMPRESSION: 1. Esophageal tube tip projects over the stomach, side-port at the GE junction, suggest further advancement for more optimal positioning 2. Dilated loops of small bowel in the left abdomen concerning for a bowel obstruction Electronically Signed   By: Jasmine PangKim  Fujinaga M.D.   On: 11/21/2017 01:50    Assessment: s/p Procedure(s): LAPAROSCOPIC ABDOMINAL EXPLORATION LYSIS OF ADHESIONS Patient Active Problem List   Diagnosis Date Noted  . Intermittent palpitations 11/21/2017  . Obesity with body mass index 30 or greater 11/21/2017  . Small bowel obstruction due to adhesions (HCC) 10/28/2016  . Small bowel obstruction s/p ex lap & lysis of adhesions 10/28/2016 10/23/2016  . Abdominal mass 10/23/2016  . Hypokalemia 10/23/2016  . Murmur 09/26/2015  . Chest pain 09/26/2015  . HTN (hypertension) 09/26/2015  . Hypothyroidism 09/26/2015    Expected post op course  Plan: Cont NG, NPO  Await return of bowel function Ambulate   LOS: 1 day     .Vanita PandaAlicia C Resa Rinks, MD [facility], [place] [phone]   11/22/2017 8:19 AM

## 2017-11-23 LAB — CBC WITH DIFFERENTIAL/PLATELET
BASOS ABS: 0 10*3/uL (ref 0.0–0.1)
Basophils Relative: 0 %
Eosinophils Absolute: 0.3 10*3/uL (ref 0.0–0.7)
Eosinophils Relative: 5 %
HEMATOCRIT: 33.8 % — AB (ref 36.0–46.0)
Hemoglobin: 10.8 g/dL — ABNORMAL LOW (ref 12.0–15.0)
LYMPHS ABS: 1.3 10*3/uL (ref 0.7–4.0)
LYMPHS PCT: 20 %
MCH: 28.2 pg (ref 26.0–34.0)
MCHC: 32 g/dL (ref 30.0–36.0)
MCV: 88.3 fL (ref 78.0–100.0)
MONO ABS: 0.4 10*3/uL (ref 0.1–1.0)
Monocytes Relative: 5 %
NEUTROS ABS: 4.6 10*3/uL (ref 1.7–7.7)
Neutrophils Relative %: 70 %
Platelets: 373 10*3/uL (ref 150–400)
RBC: 3.83 MIL/uL — AB (ref 3.87–5.11)
RDW: 13.6 % (ref 11.5–15.5)
WBC: 6.6 10*3/uL (ref 4.0–10.5)

## 2017-11-23 LAB — FERRITIN: FERRITIN: 148 ng/mL (ref 11–307)

## 2017-11-23 LAB — BASIC METABOLIC PANEL
ANION GAP: 6 (ref 5–15)
BUN: 11 mg/dL (ref 6–20)
CO2: 27 mmol/L (ref 22–32)
Calcium: 8.2 mg/dL — ABNORMAL LOW (ref 8.9–10.3)
Chloride: 111 mmol/L (ref 101–111)
Creatinine, Ser: 0.75 mg/dL (ref 0.44–1.00)
GFR calc Af Amer: >60 mL/min (ref >60)
GFR calc non Af Amer: >60 mL/min (ref >60)
GLUCOSE: 79 mg/dL (ref 65–99)
POTASSIUM: 3.9 mmol/L (ref 3.5–5.1)
Sodium: 144 mmol/L (ref 135–145)

## 2017-11-23 LAB — IRON AND TIBC
IRON: 24 ug/dL — AB (ref 28–170)
SATURATION RATIOS: 12 % (ref 10.4–31.8)
TIBC: 207 ug/dL — AB (ref 250–450)
UIBC: 183 ug/dL

## 2017-11-23 LAB — FOLATE: Folate: 26.4 ng/mL (ref >5.9)

## 2017-11-23 LAB — VITAMIN B12: Vitamin B-12: 1450 pg/mL — ABNORMAL HIGH (ref 180–914)

## 2017-11-23 NOTE — Progress Notes (Signed)
PROGRESS NOTE    Debbie Brandt  GNF:[id number] DOB: 01/23/1961 DOA: 11/20/2017 PCP: Darrow BussingKoirala, Dibas, MD    Brief Narrative:  Debbie Brandt is a 57 y.o. female with medical history significant for hypothyroidism, HTN, small bowel obstruction status post lysis of adhesions Feb 2018, who presented to the ED with complaints of abdominal pain that started yesterday.  Patient denies nausea, vomiting, or abdominal distention.  Last bowel movement was about 2 days ago.  Recent hospital admission to the hospitalist service 2/21- 11/14/17-managed for SBO.  Surgery was consulted on admission. Repeat abdominal imaging prior to discharge showed improvement.  Patient reports she went home felt better but symptoms returned. No dysuria or frequency no fever or chills.  ED Course: Stable vitals.  Elevated platelets 574 otherwise BMP, Cr, CBC unremarkable lipase 26 UA clean.  CT abdomen and pelvis with contrast showed dilated loops of small bowel with fecalized segment of small bowel in the right pelvis consistent with mechanical small bowel obstruction, with a concern for closed loop obstruction.  General surgery was consulted in the ED, effective management, NG tube at this time, recommended hospitalist admission.      Assessment & Plan:   Principal Problem:   Small bowel obstruction due to adhesions [facility](HCC) Active Problems:   HTN (hypertension)   Hypothyroidism   Small bowel obstruction s/p ex lap & lysis of adhesions 10/28/2016   Hypokalemia  #1 small bowel obstruction Status post laparoscopic abdominal exploration and lysis of adhesions per Dr. Donell BeersByerly 12/01/2017.  NG tube has been discontinued per general surgery.  Patient ambulating.  Patient with no flatus or bowel movements.  Currently n.p.o.  Mobilize.  Per general surgery.  2.  Hypertension Blood pressure stable. If further blood pressure control is needed will place on IV antihypertensive medications.  Follow for now.   3.   Hypothyroidism Continue IV Synthroid.  Once patient has been started on a diet will transition back to oral Synthroid.  4.  Hypokalemia Repleted.   5 anemia Likely dilutional versus postop anemia.  Patient with no overt bleeding.  Follow H&H.   DVT prophylaxis: SCDs Code Status: Full Family Communication: Updated patient and family at bedside. Disposition Plan: Likely home once bowels are moving, tolerating oral intake and per general surgery.   Consultants:   General surgery: Dr. Michaell CowingGross 12/01/2017  Procedures:   Laparoscopic lysis of adhesions by Dr. Donell BeersByerly 11/21/2017  Antimicrobials:   None   Subjective: Patient seen ambulating in the hallway.  NG tube has been removed.  Patient denies any nausea or vomiting.  Patient states abdominal pain is improved.  No flatus.  No bowel movement.  Patient denies any bleeding.  Objective: Vitals:   11/22/17 1417 11/22/17 1800 11/22/17 2116 11/23/17 0524  BP: 119/64 125/62 (!) 114/58 (!) 128/54  Pulse: 65 65 72 77  Resp: 16 16 16 17   Temp: 98.6 F (37 C) 99.7 F (37.6 C) 98.9 F (37.2 C) 99.2 F (37.3 C)  TempSrc: Oral Oral Oral Oral  SpO2: 98% 97% 98% 96%  Weight:      Height:        Intake/Output Summary (Last 24 hours) at 11/23/2017 1245 Last data filed at 11/23/2017 1000 Gross per 24 hour  Intake 2752.92 ml  Output 401 ml  Net 2351.92 ml   Filed Weights   11/21/17 0426  Weight: 76.8 kg (169 lb 5 oz)    Examination:  General exam: NAD.  NG tube removed. Respiratory system: Lungs clear to  auscultation bilaterally.  No wheezes, no crackles, no rhonchi.  Cardiovascular system: Regular rate and rhythm no murmurs rubs or gallops.  No JVD.  No lower extremity edema. Gastrointestinal system: Abdomen is soft, nondistended, less tender to palpation in the left lower quadrant.  Hypoactive bowel sounds.  No rebound.  No guarding.  Central nervous system: Alert and oriented. No focal neurological deficits.  Moving extremities  spontaneously. Extremities: Symmetric 5 x 5 power. Skin: No rashes, lesions or ulcers Psychiatry: Judgement and insight appear normal. Mood & affect appropriate.     Data Reviewed: I have personally reviewed following labs and imaging studies  CBC: Recent Labs  Lab 11/20/17 1407 11/22/17 0429 11/23/17 0504  WBC 4.7 8.0 6.6  NEUTROABS  --  6.1 4.6  HGB 13.8 12.0 10.8*  HCT 42.1 37.7 33.8*  MCV 86.3 88.1 88.3  PLT 574* 456* 373   Basic Metabolic Panel: Recent Labs  Lab 11/20/17 1407 11/21/17 0659 11/22/17 0429 11/23/17 0504  NA 141 142 141 144  K 3.9 3.4* 4.4 3.9  CL 103 104 108 111  CO2 28 29 27 27   GLUCOSE 112* 128* 98 79  BUN 13 12 11 11   CREATININE 0.82 0.76 0.76 0.75  CALCIUM 9.6 9.1 8.4* 8.2*  MG  --  1.9 2.1  --    GFR: Estimated Creatinine Clearance: 73.6 mL/min (by C-G formula based on SCr of 0.75 mg/dL). Liver Function Tests: Recent Labs  Lab 11/20/17 1407 11/22/17 0429  AST 18 19  ALT 17 13*  ALKPHOS 63 49  BILITOT 0.5 0.3  PROT 7.6 6.3*  ALBUMIN 4.1 3.3*   Recent Labs  Lab 11/20/17 1407  LIPASE 26   No results for input(s): AMMONIA in the last 168 hours. Coagulation Profile: No results for input(s): INR, PROTIME in the last 168 hours. Cardiac Enzymes: No results for input(s): CKTOTAL, CKMB, CKMBINDEX, TROPONINI in the last 168 hours. BNP (last 3 results) No results for input(s): PROBNP in the last 8760 hours. HbA1C: No results for input(s): HGBA1C in the last 72 hours. CBG: No results for input(s): GLUCAP in the last 168 hours. Lipid Profile: No results for input(s): CHOL, HDL, LDLCALC, TRIG, CHOLHDL, LDLDIRECT in the last 72 hours. Thyroid Function Tests: No results for input(s): TSH, T4TOTAL, FREET4, T3FREE, THYROIDAB in the last 72 hours. Anemia Panel: No results for input(s): VITAMINB12, FOLATE, FERRITIN, TIBC, IRON, RETICCTPCT in the last 72 hours. Sepsis Labs: No results for input(s): PROCALCITON, LATICACIDVEN in the last 168  hours.  No results found for this or any previous visit (from the past 240 hour(s)).       Radiology Studies: Dg Abd Portable 1v  Result Date: 11/22/2017 CLINICAL DATA:  Small bowel obstruction EXAM: PORTABLE ABDOMEN - 1 VIEW COMPARISON:  11/21/2017 FINDINGS: NG tube is in the mid stomach. Gas and stool noted in the colon. Decreasing small bowel distention. No free air organomegaly. Locules of gas noted along the left lateral portion of the image which appear to be within the subcutaneous soft tissues. This was not evident on prior plain film or CT. No acute bony abnormality. IMPRESSION: No current evidence for small bowel obstruction. Gas and stool noted within the colon. NG tube in the mid stomach. Left lateral abdominal wall and lower chest wall subcutaneous emphysema of unknown etiology. Electronically Signed   By: Charlett Nose M.D.   On: 11/22/2017 07:38        Scheduled Meds: . levothyroxine  37.5 mcg Intravenous Daily  .  lip balm  1 application Topical BID  . pantoprazole (PROTONIX) IV  40 mg Intravenous Q24H   Continuous Infusions: . sodium chloride 100 mL/hr at 11/23/17 0922     LOS: 2 days    Time spent: 35 minutes    Ramiro Harvest, MD [facility] Pager [phone]  If 7PM-7AM, please contact night-coverage [email or web address] Password [email or web address] 11/23/2017, 12:45 PM

## 2017-11-23 NOTE — Progress Notes (Signed)
2 Days Post-Op lap LOA Subjective: Pain controlled.  No flatus or BM.  NG output decreased  Objective: Vital signs in last 24 hours: Temp:  [98.6 F (37 C)-99.7 F (37.6 C)] 99.2 F (37.3 C) (03/10 0524) Pulse Rate:  [65-77] 77 (03/10 0524) Resp:  [16-17] 17 (03/10 0524) BP: (114-128)/(54-64) 128/54 (03/10 0524) SpO2:  [96 %-98 %] 96 % (03/10 0524)   Intake/Output from previous day: 03/09 0701 - 03/10 0700 In: 901.7 [P.O.:180; I.V.:721.7] Out: 401 [Urine:1; Emesis/NG output:400] Intake/Output this shift: No intake/output data recorded.   General appearance: alert and cooperative GI: normal findings: soft, mildly distended, appropriately tender  Incision: no significant drainage  Lab Results:  Recent Labs    11/22/17 0429 11/23/17 0504  WBC 8.0 6.6  HGB 12.0 10.8*  HCT 37.7 33.8*  PLT 456* 373   BMET Recent Labs    11/22/17 0429 11/23/17 0504  NA 141 144  K 4.4 3.9  CL 108 111  CO2 27 27  GLUCOSE 98 79  BUN 11 11  CREATININE 0.76 0.75  CALCIUM 8.4* 8.2*   PT/INR No results for input(s): LABPROT, INR in the last 72 hours. ABG No results for input(s): PHART, HCO3 in the last 72 hours.  Invalid input(s): PCO2, PO2  MEDS, Scheduled . levothyroxine  37.5 mcg Intravenous Daily  . lip balm  1 application Topical BID  . pantoprazole (PROTONIX) IV  40 mg Intravenous Q24H    Studies/Results: Dg Abd Portable 1v  Result Date: 11/22/2017 CLINICAL DATA:  Small bowel obstruction EXAM: PORTABLE ABDOMEN - 1 VIEW COMPARISON:  11/21/2017 FINDINGS: NG tube is in the mid stomach. Gas and stool noted in the colon. Decreasing small bowel distention. No free air organomegaly. Locules of gas noted along the left lateral portion of the image which appear to be within the subcutaneous soft tissues. This was not evident on prior plain film or CT. No acute bony abnormality. IMPRESSION: No current evidence for small bowel obstruction. Gas and stool noted within the colon. NG tube  in the mid stomach. Left lateral abdominal wall and lower chest wall subcutaneous emphysema of unknown etiology. Electronically Signed   By: Charlett NoseKevin  Dover M.D.   On: 11/22/2017 07:38    Assessment: s/p Procedure(s): LAPAROSCOPIC ABDOMINAL EXPLORATION LYSIS OF ADHESIONS Patient Active Problem List   Diagnosis Date Noted  . Intermittent palpitations 11/21/2017  . Obesity with body mass index 30 or greater 11/21/2017  . Small bowel obstruction due to adhesions (HCC) 10/28/2016  . Small bowel obstruction s/p ex lap & lysis of adhesions 10/28/2016 10/23/2016  . Abdominal mass 10/23/2016  . Hypokalemia 10/23/2016  . Murmur 09/26/2015  . Chest pain 09/26/2015  . HTN (hypertension) 09/26/2015  . Hypothyroidism 09/26/2015    Expected post op course  Plan:  clamp ng, cont npo  Await return of bowel function Ambulate   LOS: 2 days     .Debbie PandaAlicia C Alexey Rhoads, MD [facility], [place] [phone]   11/23/2017 7:37 AM

## 2017-11-23 NOTE — Progress Notes (Signed)
Pt checked frequently. Rested quietly at long intervals with eyes closed, resp even and unlabored. NG tube continues to low intermittant suction with clear, brownish color drainage removed. No c/o pain or discomfort voiced this shift. Uneventful night.

## 2017-11-23 NOTE — Progress Notes (Signed)
Initial Nutrition Assessment  DOCUMENTATION CODES:   Obesity unspecified  INTERVENTION:   Diet advancement per MD Will assess for needs upon diet advancement  NUTRITION DIAGNOSIS:   Inadequate oral intake related to inability to eat as evidenced by NPO status.  GOAL:   Patient will meet greater than or equal to 90% of their needs  MONITOR:   Diet advancement, Labs, Weight trends, I & O's  REASON FOR ASSESSMENT:   Malnutrition Screening Tool    ASSESSMENT:   57 y.o. female with medical history significant for hypothyroidism, HTN, small bowel obstruction status post lysis of adhesions Feb 2018, who presented to the ED with complaints of abdominal pain that started yesterday.   Pt currently NPO for bowel rest. NGT placed, currently clamped. Pt with no N/V. Abdominal pain began on 3/8. Pt with no weight loss, remains stable.  Will monitor for needs once diet is advanced.  Labs reviewed. Medications reviewed.  NUTRITION - FOCUSED PHYSICAL EXAM:  Nutrition focused physical exam shows no sign of depletion of muscle mass or body fat.  Diet Order:  Diet NPO time specified Except for: Ice Chips  EDUCATION NEEDS:   Not appropriate for education at this time  Skin:  Skin Assessment: Reviewed RN Assessment  Last BM:  3/1  Height:   Ht Readings from Last 1 Encounters:  11/21/17 5\' 2"  (1.575 m)    Weight:   Wt Readings from Last 1 Encounters:  11/21/17 169 lb 5 oz (76.8 kg)    Ideal Body Weight:  50 kg  BMI:  Body mass index is 30.97 kg/m.  Estimated Nutritional Needs:   Kcal:  1500-1700  Protein:  60-70g  Fluid:  1.7L/day  Tilda FrancoLindsey Davonta Stroot, MS, RD, LDN [facility] Clinical Dietitian Pager: [phone] After Hours Pager: [phone]

## 2017-11-24 LAB — BASIC METABOLIC PANEL
Anion gap: 8 (ref 5–15)
BUN: 12 mg/dL (ref 6–20)
CHLORIDE: 111 mmol/L (ref 101–111)
CO2: 23 mmol/L (ref 22–32)
CREATININE: 0.75 mg/dL (ref 0.44–1.00)
Calcium: 8.2 mg/dL — ABNORMAL LOW (ref 8.9–10.3)
GFR calc Af Amer: >60 mL/min (ref >60)
GLUCOSE: 69 mg/dL (ref 65–99)
Potassium: 3.7 mmol/L (ref 3.5–5.1)
Sodium: 142 mmol/L (ref 135–145)

## 2017-11-24 LAB — CBC WITH DIFFERENTIAL/PLATELET
Basophils Absolute: 0 10*3/uL (ref 0.0–0.1)
Basophils Relative: 0 %
EOS PCT: 8 %
Eosinophils Absolute: 0.4 10*3/uL (ref 0.0–0.7)
HCT: 33.1 % — ABNORMAL LOW (ref 36.0–46.0)
Hemoglobin: 10.6 g/dL — ABNORMAL LOW (ref 12.0–15.0)
LYMPHS ABS: 1.4 10*3/uL (ref 0.7–4.0)
LYMPHS PCT: 26 %
MCH: 28 pg (ref 26.0–34.0)
MCHC: 32 g/dL (ref 30.0–36.0)
MCV: 87.3 fL (ref 78.0–100.0)
MONOS PCT: 6 %
Monocytes Absolute: 0.3 10*3/uL (ref 0.1–1.0)
Neutro Abs: 3.2 10*3/uL (ref 1.7–7.7)
Neutrophils Relative %: 60 %
PLATELETS: 368 10*3/uL (ref 150–400)
RBC: 3.79 MIL/uL — AB (ref 3.87–5.11)
RDW: 13.2 % (ref 11.5–15.5)
WBC: 5.4 10*3/uL (ref 4.0–10.5)

## 2017-11-24 MED ORDER — POTASSIUM CHLORIDE 10 MEQ/100ML IV SOLN
10.0000 meq | INTRAVENOUS | Status: AC
  Administered 2017-11-24 (×3): 10 meq via INTRAVENOUS
  Filled 2017-11-24 (×3): qty 100

## 2017-11-24 MED ORDER — SODIUM CHLORIDE 0.9 % IV SOLN
INTRAVENOUS | Status: AC
  Administered 2017-11-24: via INTRAVENOUS
  Filled 2017-11-24 (×3): qty 1000

## 2017-11-24 MED ORDER — ENALAPRILAT 1.25 MG/ML IV SOLN
0.6250 mg | Freq: Four times a day (QID) | INTRAVENOUS | Status: DC
  Administered 2017-11-24 – 2017-11-26 (×7): 0.625 mg via INTRAVENOUS
  Filled 2017-11-24 (×9): qty 0.5

## 2017-11-24 NOTE — Progress Notes (Signed)
[facility] Progress Note  3 Days Post-Op   Assessment/Plan Principal Problem:   Small bowel obstruction due to adhesions [facility](HCC) Active Problems:   HTN (hypertension)   Hypothyroidism   Small bowel obstruction s/p ex lap & lysis of adhesions 10/28/2016   Hypokalemia  SBO - S/P laparoscopic lysis of adhesions, 03/08, Dr. Donell BeersByerly - NGT out, ice chips - await return of bowel function before advancing diet  FEN: NPO, ice chips VTE: SCD's, per medicine, okay for chemical VTE prophylaxis  ID: none Foley:  none Follow up: 2 weeks DOW clinic at [facility]  DISPO: concerns for ileus. await return of bowel function. Continue ice chips    LOS: 3 days    Subjective: CC: nausea  Debbie Brandt states onset of nausea after having ice chips. Copious belching. Having flatus, no BM. Mild abdominal pain.   Objective: Vital signs in last 24 hours: Temp:  [98.4 F (36.9 C)-99.5 F (37.5 C)] 98.9 F (37.2 C) (03/11 0541) Pulse Rate:  [58-68] 58 (03/11 0541) Resp:  [16-18] 16 (03/11 0541) BP: (123-164)/(57-67) 164/66 (03/11 0541) SpO2:  [97 %] 97 % (03/11 0541) Last BM Date: 11/14/17  Intake/Output from previous day: 03/10 0701 - 03/11 0700 In: 2759.6 [P.O.:60; I.V.:2699.6] Out: 1525 [Urine:1525] Intake/Output this shift: No intake/output data recorded.  PE: Gen:  Alert, NAD, pleasant, cooperative Pulm:  Rate and effort normal Abd: Soft, not distended, +BS, incisions well appearing, mild generalized TTP Skin: no rashes noted, warm and dry   Anti-infectives: Anti-infectives (From admission, onward)   Start     Dose/Rate Route Frequency Ordered Stop   11/21/17 1146  ceFAZolin (ANCEF) 2-4 GM/100ML-% IVPB    Comments:  Berna BueCochran, Ron   : cabinet override      11/21/17 1146 11/21/17 2359      Lab Results:  Recent Labs    11/23/17 0504 11/24/17 0443  WBC 6.6 5.4  HGB 10.8* 10.6*  HCT 33.8* 33.1*  PLT 373 368   BMET Recent Labs    11/23/17 0504 11/24/17 0443  NA  144 142  K 3.9 3.7  CL 111 111  CO2 27 23  GLUCOSE 79 69  BUN 11 12  CREATININE 0.75 0.75  CALCIUM 8.2* 8.2*   Debbie Brandt/INR No results for input(s): LABPROT, INR in the last 72 hours. CMP     Component Value Date/Time   NA 142 11/24/2017 0443   K 3.7 11/24/2017 0443   CL 111 11/24/2017 0443   CO2 23 11/24/2017 0443   GLUCOSE 69 11/24/2017 0443   BUN 12 11/24/2017 0443   CREATININE 0.75 11/24/2017 0443   CREATININE 0.83 09/26/2015 0955   CALCIUM 8.2 (L) 11/24/2017 0443   PROT 6.3 (L) 11/22/2017 0429   ALBUMIN 3.3 (L) 11/22/2017 0429   AST 19 11/22/2017 0429   ALT 13 (L) 11/22/2017 0429   ALKPHOS 49 11/22/2017 0429   BILITOT 0.3 11/22/2017 0429   GFRNONAA >60 11/24/2017 0443   GFRAA >60 11/24/2017 0443   Lipase     Component Value Date/Time   LIPASE 26 11/20/2017 1407    Studies/Results: No results found.    Jerre SimonJessica L Kori Goins , [facility] 11/24/2017, 8:54 AM  Pager: [phone] Mon-Wed, Friday 7:00am-4:30pm Thurs 7am-11:30am  Consults: [phone]

## 2017-11-24 NOTE — Progress Notes (Signed)
PROGRESS NOTE    Debbie Brandt  GLO:[id number] DOB: Mar 28, 1961 DOA: 11/20/2017 PCP: Darrow BussingKoirala, Dibas, MD    Brief Narrative:  Debbie Brandt is a 57 y.o. female with medical history significant for hypothyroidism, HTN, small bowel obstruction status post lysis of adhesions Feb 2018, who presented to the ED with complaints of abdominal pain that started yesterday.  Patient denies nausea, vomiting, or abdominal distention.  Last bowel movement was about 2 days ago.  Recent hospital admission to the hospitalist service 2/21- 11/14/17-managed for SBO.  Surgery was consulted on admission. Repeat abdominal imaging prior to discharge showed improvement.  Patient reports she went home felt better but symptoms returned. No dysuria or frequency no fever or chills.  ED Course: Stable vitals.  Elevated platelets 574 otherwise BMP, Cr, CBC unremarkable lipase 26 UA clean.  CT abdomen and pelvis with contrast showed dilated loops of small bowel with fecalized segment of small bowel in the right pelvis consistent with mechanical small bowel obstruction, with a concern for closed loop obstruction.  General surgery was consulted in the ED, effective management, NG tube at this time, recommended hospitalist admission.      Assessment & Plan:   Principal Problem:   Small bowel obstruction due to adhesions [facility](HCC) Active Problems:   HTN (hypertension)   Hypothyroidism   Small bowel obstruction s/p ex lap & lysis of adhesions 10/28/2016   Hypokalemia  #1 small bowel obstruction Status post laparoscopic abdominal exploration and lysis of adhesions per Dr. Donell BeersByerly 12/01/2017.  NG tube has been discontinued per general surgery.  Patient ambulating.  Patient belching and passing flatus however no bowel movements.  Currently n.p.o.  Keep potassium greater than 4.  Keep magnesium greater than 2.  Mobilize. Per general surgery.  2.  Hypertension Blood pressure is slowly increasing.  Will start patient on IV enalapril  and titrate as needed for blood pressure control.   3.  Hypothyroidism Continue IV Synthroid.  Once patient has been started on a diet will transition back to oral Synthroid.  4.  Hypokalemia Repleted.   5 anemia Likely dilutional versus postop anemia.  Hemoglobin has remained stable.  Patient denies any overt bleeding.    DVT prophylaxis: SCDs Code Status: Full Family Communication: Updated patient.  No family at bedside. Disposition Plan: Likely home once bowels are moving, tolerating oral intake and per general surgery.   Consultants:   General surgery: Dr. Michaell CowingGross 12/01/2017  Procedures:   Laparoscopic lysis of adhesions by Dr. Donell BeersByerly 11/21/2017  Antimicrobials:   None   Subjective: Patient sitting up in chair.  Belching.  Denies any abdominal pain.  Denies any nausea or emesis.  Passing flatus.  No bowel movement.   Objective: Vitals:   11/23/17 0524 11/23/17 1326 11/23/17 2153 11/24/17 0541  BP: (!) 128/54 (!) 123/57 139/67 (!) 164/66  Pulse: 77 67 68 (!) 58  Resp: 17 16 18 16   Temp: 99.2 F (37.3 C) 99.5 F (37.5 C) 98.4 F (36.9 C) 98.9 F (37.2 C)  TempSrc: Oral Oral Oral Oral  SpO2: 96%  97% 97%  Weight:      Height:        Intake/Output Summary (Last 24 hours) at 11/24/2017 1209 Last data filed at 11/24/2017 1052 Gross per 24 hour  Intake 868.33 ml  Output 1900 ml  Net -1031.67 ml   Filed Weights   11/21/17 0426  Weight: 76.8 kg (169 lb 5 oz)    Examination:  General exam: NAD.  NG  tube removed. Respiratory system: Clear to auscultation bilaterally.  No crackles, no wheezes, no rhonchi.   Cardiovascular system: RRR no murmurs rubs or gallops.  No lower extremity edema.  No JVD. Gastrointestinal system: Abdomen is nondistended, soft, positive bowel sounds, less tender to palpation.  No rebound.  No guarding.  Central nervous system: Alert and oriented x3. No focal neurological deficits.  Moving extremities spontaneously. Extremities: Symmetric  5 x 5 power. Skin: No rashes, lesions or ulcers Psychiatry: Judgement and insight appear normal. Mood & affect appropriate.     Data Reviewed: I have personally reviewed following labs and imaging studies  CBC: Recent Labs  Lab 11/20/17 1407 11/22/17 0429 11/23/17 0504 11/24/17 0443  WBC 4.7 8.0 6.6 5.4  NEUTROABS  --  6.1 4.6 3.2  HGB 13.8 12.0 10.8* 10.6*  HCT 42.1 37.7 33.8* 33.1*  MCV 86.3 88.1 88.3 87.3  PLT 574* 456* 373 368   Basic Metabolic Panel: Recent Labs  Lab 11/20/17 1407 11/21/17 0659 11/22/17 0429 11/23/17 0504 11/24/17 0443  NA 141 142 141 144 142  K 3.9 3.4* 4.4 3.9 3.7  CL 103 104 108 111 111  CO2 28 29 27 27 23   GLUCOSE 112* 128* 98 79 69  BUN 13 12 11 11 12   CREATININE 0.82 0.76 0.76 0.75 0.75  CALCIUM 9.6 9.1 8.4* 8.2* 8.2*  MG  --  1.9 2.1  --   --    GFR: Estimated Creatinine Clearance: 73.6 mL/min (by C-G formula based on SCr of 0.75 mg/dL). Liver Function Tests: Recent Labs  Lab 11/20/17 1407 11/22/17 0429  AST 18 19  ALT 17 13*  ALKPHOS 63 49  BILITOT 0.5 0.3  PROT 7.6 6.3*  ALBUMIN 4.1 3.3*   Recent Labs  Lab 11/20/17 1407  LIPASE 26   No results for input(s): AMMONIA in the last 168 hours. Coagulation Profile: No results for input(s): INR, PROTIME in the last 168 hours. Cardiac Enzymes: No results for input(s): CKTOTAL, CKMB, CKMBINDEX, TROPONINI in the last 168 hours. BNP (last 3 results) No results for input(s): PROBNP in the last 8760 hours. HbA1C: No results for input(s): HGBA1C in the last 72 hours. CBG: No results for input(s): GLUCAP in the last 168 hours. Lipid Profile: No results for input(s): CHOL, HDL, LDLCALC, TRIG, CHOLHDL, LDLDIRECT in the last 72 hours. Thyroid Function Tests: No results for input(s): TSH, T4TOTAL, FREET4, T3FREE, THYROIDAB in the last 72 hours. Anemia Panel: Recent Labs    11/23/17 0955  VITAMINB12 1,450*  FOLATE 26.4  FERRITIN 148  TIBC 207*  IRON 24*   Sepsis Labs: No  results for input(s): PROCALCITON, LATICACIDVEN in the last 168 hours.  No results found for this or any previous visit (from the past 240 hour(s)).       Radiology Studies: No results found.      Scheduled Meds: . enalaprilat  0.625 mg Intravenous Q6H  . levothyroxine  37.5 mcg Intravenous Daily  . lip balm  1 application Topical BID  . pantoprazole (PROTONIX) IV  40 mg Intravenous Q24H   Continuous Infusions: . sodium chloride 75 mL/hr at 11/24/17 0936     LOS: 3 days    Time spent: 35 minutes    Ramiro Harvest, MD [facility] Pager [phone]  If 7PM-7AM, please contact night-coverage [email or web address] Password [facility] 11/24/2017, 12:09 PM

## 2017-11-25 LAB — CBC
HCT: 31 % — ABNORMAL LOW (ref 36.0–46.0)
Hemoglobin: 10.2 g/dL — ABNORMAL LOW (ref 12.0–15.0)
MCH: 28.3 pg (ref 26.0–34.0)
MCHC: 32.9 g/dL (ref 30.0–36.0)
MCV: 85.9 fL (ref 78.0–100.0)
PLATELETS: 368 10*3/uL (ref 150–400)
RBC: 3.61 MIL/uL — ABNORMAL LOW (ref 3.87–5.11)
RDW: 12.9 % (ref 11.5–15.5)
WBC: 4.9 10*3/uL (ref 4.0–10.5)

## 2017-11-25 LAB — BASIC METABOLIC PANEL
Anion gap: 9 (ref 5–15)
BUN: 9 mg/dL (ref 6–20)
CALCIUM: 8.2 mg/dL — AB (ref 8.9–10.3)
CHLORIDE: 111 mmol/L (ref 101–111)
CO2: 20 mmol/L — ABNORMAL LOW (ref 22–32)
CREATININE: 0.62 mg/dL (ref 0.44–1.00)
GFR calc non Af Amer: >60 mL/min (ref >60)
Glucose, Bld: 63 mg/dL — ABNORMAL LOW (ref 65–99)
Potassium: 4.1 mmol/L (ref 3.5–5.1)
SODIUM: 140 mmol/L (ref 135–145)

## 2017-11-25 LAB — MAGNESIUM: MAGNESIUM: 1.5 mg/dL — AB (ref 1.7–2.4)

## 2017-11-25 MED ORDER — ACETAMINOPHEN 325 MG PO TABS
650.0000 mg | ORAL_TABLET | Freq: Four times a day (QID) | ORAL | Status: DC | PRN
  Administered 2017-11-26: 650 mg via ORAL
  Filled 2017-11-25: qty 2

## 2017-11-25 MED ORDER — OXYCODONE-ACETAMINOPHEN 5-325 MG PO TABS: 1.0000 | ORAL_TABLET | ORAL | Status: DC | PRN

## 2017-11-25 MED ORDER — GABAPENTIN 300 MG PO CAPS
300.0000 mg | ORAL_CAPSULE | Freq: Two times a day (BID) | ORAL | Status: DC
  Administered 2017-11-25 – 2017-11-27 (×5): 300 mg via ORAL
  Filled 2017-11-25 (×5): qty 1

## 2017-11-25 MED ORDER — METHOCARBAMOL 500 MG PO TABS
750.0000 mg | ORAL_TABLET | Freq: Three times a day (TID) | ORAL | Status: DC
  Administered 2017-11-25 – 2017-11-27 (×7): 750 mg via ORAL
  Filled 2017-11-25 (×7): qty 2

## 2017-11-25 MED ORDER — MAGNESIUM SULFATE 2 GM/50ML IV SOLN: 2.0000 g | Freq: Once | INTRAVENOUS | Status: DC

## 2017-11-25 MED ORDER — MAGNESIUM SULFATE 4 GM/100ML IV SOLN
4.0000 g | Freq: Once | INTRAVENOUS | Status: AC
  Administered 2017-11-25: 4 g via INTRAVENOUS
  Filled 2017-11-25: qty 100

## 2017-11-25 MED ORDER — OXYCODONE-ACETAMINOPHEN 5-325 MG PO TABS
1.0000 | ORAL_TABLET | ORAL | Status: DC | PRN
  Administered 2017-11-25: 1 via ORAL
  Filled 2017-11-25: qty 1

## 2017-11-25 MED ORDER — POTASSIUM CHLORIDE IN NACL 40-0.9 MEQ/L-% IV SOLN
INTRAVENOUS | Status: DC
  Administered 2017-11-25 – 2017-11-26 (×2): 100 mL/h via INTRAVENOUS
  Administered 2017-11-27: 50 mL/h via INTRAVENOUS
  Filled 2017-11-25 (×4): qty 1000

## 2017-11-25 NOTE — Discharge Instructions (Signed)
[facility] ______[facility] °LAPAROSCOPIC SURGERY: POST OP INSTRUCTIONS °Always review your discharge instruction sheet given to you by the facility where your surgery was performed. °IF YOU HAVE DISABILITY OR FAMILY LEAVE FORMS, YOU MUST BRING THEM TO THE OFFICE FOR PROCESSING.   °DO NOT GIVE THEM TO YOUR DOCTOR. ° °1. A prescription for pain medication may be given to you upon discharge.  Take your pain medication as prescribed, if needed.  If narcotic pain medicine is not needed, then you may take acetaminophen (Tylenol) or ibuprofen (Advil) as needed. °2. Take your usually prescribed medications unless otherwise directed. °3. If you need a refill on your pain medication, please contact your pharmacy.  They will contact our office to request authorization. Prescriptions will not be filled after 5pm or on week-ends. °4. You should follow a light diet the first few days after arrival home, such as soup and crackers, etc.  Be sure to include lots of fluids daily. °5. Most patients will experience some swelling and bruising in the area of the incisions.  Ice packs will help.  Swelling and bruising can take several days to resolve.  °6. It is common to experience some constipation if taking pain medication after surgery.  Increasing fluid intake and taking a stool softener (such as Colace) will usually help or prevent this problem from occurring.  A mild laxative (Milk of Magnesia or Miralax) should be taken according to package instructions if there are no bowel movements after 48 hours. °7. Unless discharge instructions indicate otherwise, you may remove your bandages 24-48 hours after surgery, and you may shower at that time.  You may have steri-strips (small skin tapes) in place directly over the incision.  These strips should be left on the skin for 7-10 days.  If your surgeon used skin glue on the incision, you may shower in 24 hours.  The glue will flake off over the next 2-3 weeks.  Any sutures or  staples will be removed at the office during your follow-up visit. °8. ACTIVITIES:  You may resume regular (light) daily activities beginning the next day--such as daily self-care, walking, climbing stairs--gradually increasing activities as tolerated.  You may have sexual intercourse when it is comfortable.  Refrain from any heavy lifting or straining until approved by your doctor. °a. You may drive when you are no longer taking prescription pain medication, you can comfortably wear a seatbelt, and you can safely maneuver your car and apply brakes. °b. RETURN TO WORK:  __________________________________________________________ °9. You should see your doctor in the office for a follow-up appointment approximately 2-3 weeks after your surgery.  Make sure that you call for this appointment within a day or two after you arrive home to insure a convenient appointment time. °10. OTHER INSTRUCTIONS: __________________________________________________________________________________________________________________________ __________________________________________________________________________________________________________________________ °WHEN TO CALL YOUR DOCTOR: °1. Fever over 101.0 °2. Inability to urinate °3. Continued bleeding from incision. °4. Increased pain, redness, or drainage from the incision. °5. Increasing abdominal pain ° °The clinic staff is available to answer your questions during regular business hours.  Please don’t hesitate to call and ask to speak to one of the nurses for clinical concerns.  If you have a medical emergency, go to the nearest emergency room or call 911.  A surgeon from [facility] is always on call at the hospital. °[address], [address], [place], [place]  [address] ? [address] [address], [place], [place]   [address] °[phone] ? [phone] ? FAX [phone] °Web site:   [email or web address] °

## 2017-11-25 NOTE — Progress Notes (Signed)
PROGRESS NOTE    Debbie TIETJE  Brandt:[id number] DOB: Jan 05, 1961 DOA: 11/20/2017 PCP: Darrow Bussing, MD    Brief Narrative:  Debbie Brandt is a 57 y.o. female with medical history significant for hypothyroidism, HTN, small bowel obstruction status post lysis of adhesions Feb 2018, who presented to the ED with complaints of abdominal pain that started yesterday.  Patient denies nausea, vomiting, or abdominal distention.  Last bowel movement was about 2 days ago.  Recent hospital admission to the hospitalist service 2/21- 11/14/17-managed for SBO.  Surgery was consulted on admission. Repeat abdominal imaging prior to discharge showed improvement.  Patient reports she went home felt better but symptoms returned. No dysuria or frequency no fever or chills.  ED Course: Stable vitals.  Elevated platelets 574 otherwise BMP, Cr, CBC unremarkable lipase 26 UA clean.  CT abdomen and pelvis with contrast showed dilated loops of small bowel with fecalized segment of small bowel in the right pelvis consistent with mechanical small bowel obstruction, with a concern for closed loop obstruction.  General surgery was consulted in the ED, effective management, NG tube at this time, recommended hospitalist admission.      Assessment & Plan:   Principal Problem:   Small bowel obstruction due to adhesions [facility]) Active Problems:   HTN (hypertension)   Hypothyroidism   Small bowel obstruction s/p ex lap & lysis of adhesions 10/28/2016   Hypokalemia  #1 small bowel obstruction Status post laparoscopic abdominal exploration and lysis of adhesions per Dr. Donell Beers 12/01/2017.  NG tube has been discontinued per general surgery.  Patient ambulating.  Patient belching and passing flatus.  She states had a bowel movement this morning.  Patient's diet has been advanced to clear liquid diet per general surgery.  Follow.  Mobilize.  Keep potassium greater than 4.  Keep magnesium greater than 2.    2.   Hypertension Blood pressure is improving on current regimen of IV enalapril.    3.  Hypothyroidism Continue IV Synthroid.  Once patient has been started on a diet will transition back to oral Synthroid.  4.  Hypokalemia/hypomagnesemia Potassium has been repleted.  We will give magnesium 4 g IV x1.  Keep mag greater than 2.  Keep potassium greater than 4.    5 anemia Likely dilutional versus postop anemia.  Hemoglobin currently stable.  No overt bleeding.  Follow H&H.    DVT prophylaxis: SCDs Code Status: Full Family Communication: Updated patient.  No family at bedside. Disposition Plan: Likely home once bowels are moving, tolerating oral intake and per general surgery.   Consultants:   General surgery: Dr. Michaell Cowing 12/01/2017  Procedures:   Laparoscopic lysis of adhesions by Dr. Donell Beers 11/21/2017  Antimicrobials:   None   Subjective: Patient in chair.  Belching.  Passing flatus.  States she has had a bowel movement.  No chest pain.  No shortness of breath.    Objective: Vitals:   11/24/17 1443 11/24/17 2115 11/24/17 2339 11/25/17 0553  BP: (!) 151/69 (!) 141/65 (!) 153/56 (!) 146/63  Pulse: 63 62 62 (!) 59  Resp: 16 16  16   Temp: 98.7 F (37.1 C) 98.5 F (36.9 C)  98.5 F (36.9 C)  TempSrc: Oral Oral  Oral  SpO2: 100% 100%  100%  Weight:      Height:        Intake/Output Summary (Last 24 hours) at 11/25/2017 1124 Last data filed at 11/25/2017 1002 Gross per 24 hour  Intake 836.66 ml  Output 2275  ml  Net -1438.34 ml   Filed Weights   11/21/17 0426  Weight: 76.8 kg (169 lb 5 oz)    Examination:  General exam: NAD.  NG tube removed. Respiratory system: Lungs are clear to auscultation bilaterally.  No wheezes, no crackles, no rhonchi.  Cardiovascular system: Regular rate and rhythm no murmurs rubs or gallops.  No lower extremity edema.  No JVD. Gastrointestinal system: Abdomen is soft, nondistended, positive bowel sounds, some tenderness to palpation in the  lower abdomen.  No guarding.  No rebound.   Central nervous system: Alert and oriented x3. No focal neurological deficits.  Extremities: Symmetric 5 x 5 power. Skin: No rashes, lesions or ulcers Psychiatry: Judgement and insight appear normal. Mood & affect appropriate.     Data Reviewed: I have personally reviewed following labs and imaging studies  CBC: Recent Labs  Lab 11/20/17 1407 11/22/17 0429 11/23/17 0504 11/24/17 0443 11/25/17 0430  WBC 4.7 8.0 6.6 5.4 4.9  NEUTROABS  --  6.1 4.6 3.2  --   HGB 13.8 12.0 10.8* 10.6* 10.2*  HCT 42.1 37.7 33.8* 33.1* 31.0*  MCV 86.3 88.1 88.3 87.3 85.9  PLT 574* 456* 373 368 368   Basic Metabolic Panel: Recent Labs  Lab 11/21/17 0659 11/22/17 0429 11/23/17 0504 11/24/17 0443 11/25/17 0430  NA 142 141 144 142 140  K 3.4* 4.4 3.9 3.7 4.1  CL 104 108 111 111 111  CO2 29 27 27 23  20*  GLUCOSE 128* 98 79 69 63*  BUN 12 11 11 12 9   CREATININE 0.76 0.76 0.75 0.75 0.62  CALCIUM 9.1 8.4* 8.2* 8.2* 8.2*  MG 1.9 2.1  --   --  1.5*   GFR: Estimated Creatinine Clearance: 73.6 mL/min (by C-G formula based on SCr of 0.62 mg/dL). Liver Function Tests: Recent Labs  Lab 11/20/17 1407 11/22/17 0429  AST 18 19  ALT 17 13*  ALKPHOS 63 49  BILITOT 0.5 0.3  PROT 7.6 6.3*  ALBUMIN 4.1 3.3*   Recent Labs  Lab 11/20/17 1407  LIPASE 26   No results for input(s): AMMONIA in the last 168 hours. Coagulation Profile: No results for input(s): INR, PROTIME in the last 168 hours. Cardiac Enzymes: No results for input(s): CKTOTAL, CKMB, CKMBINDEX, TROPONINI in the last 168 hours. BNP (last 3 results) No results for input(s): PROBNP in the last 8760 hours. HbA1C: No results for input(s): HGBA1C in the last 72 hours. CBG: No results for input(s): GLUCAP in the last 168 hours. Lipid Profile: No results for input(s): CHOL, HDL, LDLCALC, TRIG, CHOLHDL, LDLDIRECT in the last 72 hours. Thyroid Function Tests: No results for input(s): TSH,  T4TOTAL, FREET4, T3FREE, THYROIDAB in the last 72 hours. Anemia Panel: Recent Labs    11/23/17 0955  VITAMINB12 1,450*  FOLATE 26.4  FERRITIN 148  TIBC 207*  IRON 24*   Sepsis Labs: No results for input(s): PROCALCITON, LATICACIDVEN in the last 168 hours.  No results found for this or any previous visit (from the past 240 hour(s)).       Radiology Studies: No results found.      Scheduled Meds: . enalaprilat  0.625 mg Intravenous Q6H  . gabapentin  300 mg Oral BID  . levothyroxine  37.5 mcg Intravenous Daily  . lip balm  1 application Topical BID  . methocarbamol  750 mg Oral Q8H  . pantoprazole (PROTONIX) IV  40 mg Intravenous Q24H   Continuous Infusions: . magnesium sulfate 1 - 4 g bolus IVPB  4 g (11/25/17 1005)  . sodium chloride 0.9 % 1,000 mL with potassium chloride 40 mEq infusion 100 mL/hr at 11/24/17 2350     LOS: 4 days    Time spent: 35 minutes    Ramiro Harvest, MD [facility] Pager [phone]  If 7PM-7AM, please contact night-coverage [email or web address] Password [facility] 11/25/2017, 11:24 AM

## 2017-11-25 NOTE — Progress Notes (Signed)
4 Days Post-Op    CC:  Subjective: Some flatus and no nausea with the little PO she took.  K+ hurt her a good deal but K+, is up to 4.0.  Few BS this AM also.  Port sites all look good.   Objective: Vital signs in last 24 hours: Temp:  [98.5 F (36.9 C)-98.7 F (37.1 C)] 98.5 F (36.9 C) (03/12 0553) Pulse Rate:  [59-63] 59 (03/12 0553) Resp:  [16] 16 (03/12 0553) BP: (141-153)/(56-69) 146/63 (03/12 0553) SpO2:  [100 %] 100 % (03/12 0553) Last BM Date: 11/14/17 180 PO 716 IV 2275 urine Afebrile, VSS Labs OK mag is a little low  Intake/Output from previous day: 03/11 0701 - 03/12 0700 In: 896.7 [P.O.:180; I.V.:616.7; IV Piggyback:100] Out: 2275 [Urine:2275] Intake/Output this shift: No intake/output data recorded.  General appearance: alert, cooperative and no distress Resp: clear to auscultation bilaterally GI: soft, sore, few BS, some flatus.  Port sites look good.  Lab Results:  Recent Labs    11/24/17 0443 11/25/17 0430  WBC 5.4 4.9  HGB 10.6* 10.2*  HCT 33.1* 31.0*  PLT 368 368    BMET Recent Labs    11/24/17 0443 11/25/17 0430  NA 142 140  K 3.7 4.1  CL 111 111  CO2 23 20*  GLUCOSE 69 63*  BUN 12 9  CREATININE 0.75 0.62  CALCIUM 8.2* 8.2*   PT/INR No results for input(s): LABPROT, INR in the last 72 hours.  Recent Labs  Lab 11/20/17 1407 11/22/17 0429  AST 18 19  ALT 17 13*  ALKPHOS 63 49  BILITOT 0.5 0.3  PROT 7.6 6.3*  ALBUMIN 4.1 3.3*     Lipase     Component Value Date/Time   LIPASE 26 11/20/2017 1407     Medications: . enalaprilat  0.625 mg Intravenous Q6H  . levothyroxine  37.5 mcg Intravenous Daily  . lip balm  1 application Topical BID  . pantoprazole (PROTONIX) IV  40 mg Intravenous Q24H   . sodium chloride 0.9 % 1,000 mL with potassium chloride 40 mEq infusion 100 mL/hr at 11/24/17 2350    Assessment/Plan  Small bowel obstruction due to adhesions [facility]) Active Problems:   HTN (hypertension)    Hypothyroidism   Small bowel obstruction s/p ex lap & lysis of adhesions 10/28/2016   Hypokalemia  SBO - S/P laparoscopic lysis of adhesions, 03/08, Dr. Donell Beers POD4 - NGT out, ice chips - await return of bowel function before advancing diet  FEN: IV fluids/NPO, ice chips VTE: SCD's, per medicine, okay for chemical VTE prophylaxis  ID: none Foley:  none Follow up: 2 weeks Dr. Donell Beers   Plan:  Clears as tolerated today, keep her up walking and await bowel function.  Give her some Mag this AM also.         LOS: 4 days    Cathyann Kilfoyle 11/25/2017 [phone]

## 2017-11-26 DIAGNOSIS — I10 Essential (primary) hypertension: Secondary | ICD-10-CM

## 2017-11-26 DIAGNOSIS — E038 Other specified hypothyroidism: Secondary | ICD-10-CM

## 2017-11-26 DIAGNOSIS — K565 Intestinal adhesions [bands], unspecified as to partial versus complete obstruction: Secondary | ICD-10-CM

## 2017-11-26 LAB — BASIC METABOLIC PANEL
ANION GAP: 7 (ref 5–15)
BUN: 5 mg/dL — ABNORMAL LOW (ref 6–20)
CO2: 22 mmol/L (ref 22–32)
CREATININE: 0.58 mg/dL (ref 0.44–1.00)
Calcium: 8.4 mg/dL — ABNORMAL LOW (ref 8.9–10.3)
Chloride: 111 mmol/L (ref 101–111)
GFR calc non Af Amer: >60 mL/min (ref >60)
Glucose, Bld: 87 mg/dL (ref 65–99)
Potassium: 4.6 mmol/L (ref 3.5–5.1)
SODIUM: 140 mmol/L (ref 135–145)

## 2017-11-26 LAB — HCG, QUANTITATIVE, PREGNANCY: HCG, BETA CHAIN, QUANT, S: 6 m[IU]/mL — AB (ref <5)

## 2017-11-26 LAB — MAGNESIUM: MAGNESIUM: 1.9 mg/dL (ref 1.7–2.4)

## 2017-11-26 MED ORDER — LOSARTAN POTASSIUM-HCTZ 50-12.5 MG PO TABS: 1.0000 | ORAL_TABLET | Freq: Every day | ORAL | Status: DC

## 2017-11-26 MED ORDER — LEVOTHYROXINE SODIUM 75 MCG PO TABS
75.0000 ug | ORAL_TABLET | Freq: Every day | ORAL | Status: DC
  Administered 2017-11-27: 75 ug via ORAL
  Filled 2017-11-26: qty 1

## 2017-11-26 MED ORDER — LOSARTAN POTASSIUM 50 MG PO TABS
50.0000 mg | ORAL_TABLET | Freq: Every day | ORAL | Status: DC
  Administered 2017-11-26 – 2017-11-27 (×2): 50 mg via ORAL
  Filled 2017-11-26 (×2): qty 1

## 2017-11-26 MED ORDER — HYDROCHLOROTHIAZIDE 12.5 MG PO CAPS
12.5000 mg | ORAL_CAPSULE | Freq: Every day | ORAL | Status: DC
  Administered 2017-11-26 – 2017-11-27 (×2): 12.5 mg via ORAL
  Filled 2017-11-26 (×2): qty 1

## 2017-11-26 MED ORDER — ENOXAPARIN SODIUM 40 MG/0.4ML ~~LOC~~ SOLN
40.0000 mg | SUBCUTANEOUS | Status: DC
  Administered 2017-11-26 – 2017-11-27 (×2): 40 mg via SUBCUTANEOUS
  Filled 2017-11-26 (×2): qty 0.4

## 2017-11-26 NOTE — Progress Notes (Signed)
PROGRESS NOTE        PATIENT DETAILS Name: Debbie Brandt Age: 57 y.o. Sex: female Date of Birth: Jul 09, 1961 Admit Date: 11/20/2017 Admitting Physician Onnie Boer, MD ZOX:WRUEAVW, Dibas, MD  Brief Narrative: Patient is a 57 y.o. female with prior history of hypertension-prior history of small bowel obstruction requiring laparotomy with lysis of additions in February 2018-presented to the ED with abdominal pain, further evaluation revealed recurrent small bowel obstruction. Initially tried on conservative measures-unfortunately did not improve, and underwent laparoscopic abdominal exploration with lysis of adhesions on 3/8. Bowel function slowly improving-see below for further details.  Subjective: Had a bowel movement last night-passing flatus this morning. No other major issues  Assessment/Plan: Small bowel obstruction: Underwent laparoscopic abdominal exploration and lysis of adhesions on 3/8-bowel function slowly returning-passing flatus-in fact had a bowel movement yesterday. Continue mobilization. Gen. surgery following, diet being slowly advanced.  Hypertension: Stop IV enalapril-resume oral antihypertensive regimen and follow.  Hypothyroidism: Stop IV levothyroxine-resume usual dosing of oral levothyroxine.  Anemia: Mild-likely due to acute illness and IV fluid dilution-stable for close outpatient follow-up.  DVT Prophylaxis: Prophylactic Lovenox   Code Status: Full code   Family Communication: None at bedside  Disposition Plan: Remain inpatient-hopefully home tomorrow  Antimicrobial agents: Anti-infectives (From admission, onward)   Start     Dose/Rate Route Frequency Ordered Stop   11/21/17 1146  ceFAZolin (ANCEF) 2-4 GM/100ML-% IVPB    Comments:  Berna Bue, Ron   : cabinet override      11/21/17 1146 11/21/17 2359      Procedures: 3/8>>laparoscopic abdominal exploration with lysis of adhesions  CONSULTS:  general  surgery  Time spent: 25 minutes-Greater than 50% of this time was spent in counseling, explanation of diagnosis, planning of further management, and coordination of care.  MEDICATIONS: Scheduled Meds: . enalaprilat  0.625 mg Intravenous Q6H  . enoxaparin (LOVENOX) injection  40 mg Subcutaneous Q24H  . gabapentin  300 mg Oral BID  . levothyroxine  37.5 mcg Intravenous Daily  . lip balm  1 application Topical BID  . methocarbamol  750 mg Oral Q8H  . pantoprazole (PROTONIX) IV  40 mg Intravenous Q24H   Continuous Infusions: . 0.9 % NaCl with KCl 40 mEq / L 100 mL/hr (11/26/17 0006)   PRN Meds:.acetaminophen, hydrALAZINE, hydrocortisone, hydrocortisone cream, magic mouthwash, menthol-cetylpyridinium, morphine injection, ondansetron, oxyCODONE-acetaminophen, phenol   PHYSICAL EXAM: Vital signs: Vitals:   11/25/17 0553 11/25/17 1406 11/25/17 2131 11/26/17 0528  BP: (!) 146/63 (!) 137/52 (!) 144/65 (!) 158/66  Pulse: (!) 59 66 65 78  Resp: 16 16 16 16   Temp: 98.5 F (36.9 C) 98.6 F (37 C) 99 F (37.2 C) 98.7 F (37.1 C)  TempSrc: Oral Oral Oral Oral  SpO2: 100% 100% 100% 100%  Weight:      Height:       Filed Weights   11/21/17 0426  Weight: 76.8 kg (169 lb 5 oz)   Body mass index is 30.97 kg/m.   General appearance :Awake, alert, not in any distress.  Eyes:, pupils equally reactive to light and accomodation,no scleral icterus. HEENT: Atraumatic and Normocephalic Neck: supple, no JVD. No cervical lymphadenopathy. Resp:Good air entry bilaterally, no added sounds  CVS: S1 S2 regular, no murmurs.  GI: Bowel sounds present, mildly but appropriately tender. No peritoneal signs.  Extremities: B/L Lower Ext shows no edema,  both legs are warm to touch Neurology:  speech clear,Non focal, sensation is grossly intact. Psychiatric: Normal judgment and insight. Alert and oriented x 3. Normal mood. Musculoskeletal:No digital cyanosis Skin:No Rash, warm and dry Wounds:N/A  I  have personally reviewed following labs and imaging studies  LABORATORY DATA: CBC: Recent Labs  Lab 11/20/17 1407 11/22/17 0429 11/23/17 0504 11/24/17 0443 11/25/17 0430  WBC 4.7 8.0 6.6 5.4 4.9  NEUTROABS  --  6.1 4.6 3.2  --   HGB 13.8 12.0 10.8* 10.6* 10.2*  HCT 42.1 37.7 33.8* 33.1* 31.0*  MCV 86.3 88.1 88.3 87.3 85.9  PLT 574* 456* 373 368 368    Basic Metabolic Panel: Recent Labs  Lab 11/21/17 0659 11/22/17 0429 11/23/17 0504 11/24/17 0443 11/25/17 0430 11/26/17 0504  NA 142 141 144 142 140 140  K 3.4* 4.4 3.9 3.7 4.1 4.6  CL 104 108 111 111 111 111  CO2 29 27 27 23  20* 22  GLUCOSE 128* 98 79 69 63* 87  BUN 12 11 11 12 9  5*  CREATININE 0.76 0.76 0.75 0.75 0.62 0.58  CALCIUM 9.1 8.4* 8.2* 8.2* 8.2* 8.4*  MG 1.9 2.1  --   --  1.5* 1.9    GFR: Estimated Creatinine Clearance: 73.6 mL/min (by C-G formula based on SCr of 0.58 mg/dL).  Liver Function Tests: Recent Labs  Lab 11/20/17 1407 11/22/17 0429  AST 18 19  ALT 17 13*  ALKPHOS 63 49  BILITOT 0.5 0.3  PROT 7.6 6.3*  ALBUMIN 4.1 3.3*   Recent Labs  Lab 11/20/17 1407  LIPASE 26   No results for input(s): AMMONIA in the last 168 hours.  Coagulation Profile: No results for input(s): INR, PROTIME in the last 168 hours.  Cardiac Enzymes: No results for input(s): CKTOTAL, CKMB, CKMBINDEX, TROPONINI in the last 168 hours.  BNP (last 3 results) No results for input(s): PROBNP in the last 8760 hours.  HbA1C: No results for input(s): HGBA1C in the last 72 hours.  CBG: No results for input(s): GLUCAP in the last 168 hours.  Lipid Profile: No results for input(s): CHOL, HDL, LDLCALC, TRIG, CHOLHDL, LDLDIRECT in the last 72 hours.  Thyroid Function Tests: No results for input(s): TSH, T4TOTAL, FREET4, T3FREE, THYROIDAB in the last 72 hours.  Anemia Panel: No results for input(s): VITAMINB12, FOLATE, FERRITIN, TIBC, IRON, RETICCTPCT in the last 72 hours.  Urine analysis:    Component Value  Date/Time   COLORURINE YELLOW 11/20/2017 2017   APPEARANCEUR CLEAR 11/20/2017 2017   LABSPEC 1.020 11/20/2017 2017   PHURINE 7.0 11/20/2017 2017   GLUCOSEU NEGATIVE 11/20/2017 2017   HGBUR NEGATIVE 11/20/2017 2017   BILIRUBINUR NEGATIVE 11/20/2017 2017   KETONESUR 20 (A) 11/20/2017 2017   PROTEINUR NEGATIVE 11/20/2017 2017   UROBILINOGEN 0.2 04/15/2008 1541   NITRITE NEGATIVE 11/20/2017 2017   LEUKOCYTESUR NEGATIVE 11/20/2017 2017    Sepsis Labs: Lactic Acid, Venous    Component Value Date/Time   LATICACIDVEN 1.62 10/22/2016 0753    MICROBIOLOGY: No results found for this or any previous visit (from the past 240 hour(s)).  RADIOLOGY STUDIES/RESULTS: Dg Abdomen 1 View  Result Date: 11/06/2017 CLINICAL DATA:  Evaluate nasogastric tube placement. EXAM: ABDOMEN - 1 VIEW COMPARISON:  Abdominal radiograph October 28, 2016 and CT abdomen and pelvis October 16, 2017 FINDINGS: Nasogastric tube courses in distribution stomach with distal tip projecting in mid to distal duodenum. Included abdomen demonstrates mildly distended small bowel. Stool and air in the included large bowel. IMPRESSION:  Nasogastric tube tip projecting in mid to distal duodenum. Included abdomen demonstrates mildly prominent small bowel, possible early small bowel obstruction. Electronically Signed   By: Awilda Metro M.D.   On: 11/06/2017 23:04   Ct Abdomen Pelvis W Contrast  Result Date: 11/20/2017 CLINICAL DATA:  Abdominal pain EXAM: CT ABDOMEN AND PELVIS WITH CONTRAST TECHNIQUE: Multidetector CT imaging of the abdomen and pelvis was performed using the standard protocol following bolus administration of intravenous contrast. CONTRAST:  ISOVUE-300 IOPAMIDOL (ISOVUE-300) INJECTION 61% COMPARISON:  Radiograph 228 2019, CT 10/16/2017, 10/23/2016, 10/22/2016 FINDINGS: Lower chest: Lung bases demonstrate no acute consolidation or pleural effusion. Borderline heart size. Hepatobiliary: No focal liver abnormality is  seen. No gallstones, gallbladder wall thickening, or biliary dilatation. Pancreas: Unremarkable. No pancreatic ductal dilatation or surrounding inflammatory changes. Spleen: Normal in size without focal abnormality. Adrenals/Urinary Tract: Adrenal glands are within normal limits. No hydronephrosis. Cyst in the upper pole of the left kidney. Bladder unremarkable. Stomach/Bowel: Moderate enlargement of the stomach. Dilated loops of small bowel measuring up to 4.2 cm with fecalized segment of dilated small bowel in the mid pelvis. Transition point appears to be associated with crowded appearance of mesenteric vasculature with suggested mesenteric swirling pattern, series 2, image number 52. No colon wall thickening. Normal appendix. Distal small bowel is decompressed. Vascular/Lymphatic: Nonaneurysmal aorta. No significantly enlarged lymph nodes. Reproductive: Status post hysterectomy. No adnexal masses. Other: Negative for free air or free fluid. Musculoskeletal: Degenerative changes. No acute or suspicious lesion. IMPRESSION: 1. Dilated loops of small bowel with fecalized segment of small bowel in the right pelvis, consistent with mechanical small bowel obstruction. Transition point appears to be within the upper pelvis where there is mesenteric vascular crowding and slight swirled appearance of the vasculature, either representing volvulus or obstruction related to internal hernia and raising concern for closed loop obstruction. 2. Otherwise no significant interval change compared to prior studies. Electronically Signed   By: Jasmine Pang M.D.   On: 11/20/2017 23:27   Dg Abd 2 Views  Result Date: 11/11/2017 CLINICAL DATA:  Abdominal distention.  Constipation. EXAM: ABDOMEN - 2 VIEW COMPARISON:  11/09/2017.  CT 10/16/2017. FINDINGS: Interval removal of NG tube. Distended loops of small bowel are noted. Similar findings noted on prior exam. Oral contrast in the colon. These findings suggest partial small bowel  obstruction. No free air. Pelvic calcifications consistent phleboliths. No acute bony abnormality. IMPRESSION: 1.  Interim removal of NG tube. 2. Persistent distended loops of small bowel. Similar findings noted on prior exam. Oral contrast in the colon. These findings are consistent with partial small bowel obstruction. Continued follow-up exams to demonstrate resolution suggested. Electronically Signed   By: Maisie Fus  Register   On: 11/11/2017 11:58   Dg Abd Portable 1v  Result Date: 11/22/2017 CLINICAL DATA:  Small bowel obstruction EXAM: PORTABLE ABDOMEN - 1 VIEW COMPARISON:  11/21/2017 FINDINGS: NG tube is in the mid stomach. Gas and stool noted in the colon. Decreasing small bowel distention. No free air organomegaly. Locules of gas noted along the left lateral portion of the image which appear to be within the subcutaneous soft tissues. This was not evident on prior plain film or CT. No acute bony abnormality. IMPRESSION: No current evidence for small bowel obstruction. Gas and stool noted within the colon. NG tube in the mid stomach. Left lateral abdominal wall and lower chest wall subcutaneous emphysema of unknown etiology. Electronically Signed   By: Charlett Nose M.D.   On: 11/22/2017 07:38  Dg Abd Portable 1v-small Bowel Protocol-position Verification  Result Date: 11/21/2017 CLINICAL DATA:  NG tube placement EXAM: PORTABLE ABDOMEN - 1 VIEW COMPARISON:  CT 11/20/2017, radiograph 11/13/2017 FINDINGS: Esophageal tube tip is in the left upper quadrant, side-port projects over the GE junction. Dilated loops of small bowel measuring up to 4.6 cm. Contrast within the renal collecting systems and bladder. IMPRESSION: 1. Esophageal tube tip projects over the stomach, side-port at the GE junction, suggest further advancement for more optimal positioning 2. Dilated loops of small bowel in the left abdomen concerning for a bowel obstruction Electronically Signed   By: Jasmine PangKim  Fujinaga M.D.   On: 11/21/2017 01:50     Dg Abd Portable 1v  Result Date: 11/13/2017 CLINICAL DATA:  Small-bowel obstruction EXAM: PORTABLE ABDOMEN - 1 VIEW COMPARISON:  11/11/2017. FINDINGS: Persistent but improved distention of small bowel loops. Oral contrast again noted the colon. Colon is nondistended. No free air. Pelvic calcification consistent phlebolith. No acute bony abnormality. IMPRESSION: Persistent but improved distention of small bowel loops. Colonic gas pattern is normal. Oral contrast in the colon. Continued follow-up exams suggested to demonstrate resolution of the residual small bowel distention. Electronically Signed   By: Maisie Fushomas  Register   On: 11/13/2017 07:19   Dg Abd Portable 1v  Result Date: 11/09/2017 CLINICAL DATA:  Abdominal pain EXAM: PORTABLE ABDOMEN - 1 VIEW COMPARISON:  T 12/03/2017 FINDINGS: Nasogastric tube tip remains in the gastric antrum region. Previously administered contrast remains within the right colon. Mild prominence of the small bowel gas pattern which could go along with mild ileus or partial small bowel obstruction. Not definitely abnormal however. IMPRESSION: Nasogastric tube tip in the gastric antrum. No progression of the hyperdense material in the right colon. Slight prominence of the proximal small bowel which could be within normal limits or mildly abnormal as above. Electronically Signed   By: Paulina FusiMark  Shogry M.D.   On: 11/09/2017 06:28   Dg Abd Portable 1v  Result Date: 11/08/2017 CLINICAL DATA:  Nasogastric position. EXAM: PORTABLE ABDOMEN - 1 VIEW COMPARISON:  11/07/2017 FINDINGS: Nasogastric tube tip is in the antrum or pylorus region. Bowel gas pattern is normal. Previously administered contrast remains within the right colon. IMPRESSION: Nasogastric tube tip in the antrum or pylorus. Electronically Signed   By: Paulina FusiMark  Shogry M.D.   On: 11/08/2017 06:46   Dg Abd Portable 1v-small Bowel Obstruction Protocol-initial, 8 Hr Delay  Result Date: 11/07/2017 CLINICAL DATA:  8 hour delay,  bowel obstruction EXAM: PORTABLE ABDOMEN - 1 VIEW COMPARISON:  11/06/2017, CT 10/16/2017 FINDINGS: Tip of the esophageal tube projects over the upper abdomen. Some retained contrast within pelvic small bowel loops. There is continued mild gaseous enlargement of left abdominal small bowel loops up to 3.6 cm. There is contrast present in the right colon and appendix. Calcified pelvic phleboliths. IMPRESSION: 1. Contrast in the right colon with mild retained contrast within pelvic small bowel loops 2. There is persistent mild dilatation of left abdominal small bowel, query mild ileus Electronically Signed   By: Jasmine PangKim  Fujinaga M.D.   On: 11/07/2017 18:44     LOS: 5 days   Jeoffrey MassedShanker Tredarius Cobern, MD  [facility] Pager:[phone]  If 7PM-7AM, please contact night-coverage [email or web address] Password [email or web address] 11/26/2017, 11:48 AM

## 2017-11-26 NOTE — Progress Notes (Signed)
[facility] Progress Note  5 Days Post-Op   Assessment/Plan Small bowel obstruction due to adhesions [facility](HCC) Active Problems: HTN (hypertension) Hypothyroidism Small bowel obstruction s/p ex lap &lysis of adhesions 10/28/2016 Hypokalemia  SBO - S/P laparoscopic lysis of adhesions, 03/08, Dr. Donell BeersByerly POD4 - having flatus, advance diet to fulls  FEN:IV fluids/fulls VTE: SCD's,lovenox ZO:[id number]:Cefotetan once 03/08 Foley:none Follow up:2 weeks Dr. Donell BeersByerly   Plan:  fulls, keep her up walking and await bowel function.     LOS: 5 days    Subjective: CC: abdominal soreness  No nausea or vomiting overnight. She is tolerating clears and having flatus. No new complaints.   Objective: Vital signs in last 24 hours: Temp:  [98.6 F (37 C)-99 F (37.2 C)] 98.7 F (37.1 C) (03/13 0528) Pulse Rate:  [65-78] 78 (03/13 0528) Resp:  [16] 16 (03/13 0528) BP: (137-158)/(52-66) 158/66 (03/13 0528) SpO2:  [100 %] 100 % (03/13 0528) Last BM Date: 11/14/17  Intake/Output from previous day: 03/12 0701 - 03/13 0700 In: 3480 [P.O.:1020; I.V.:2360; IV Piggyback:100] Out: 2775 [Urine:2775] Intake/Output this shift: No intake/output data recorded.  PE: Gen:  Alert, NAD, pleasant, cooperative Pulm:  Rate and effort normal Abd: Soft, not distended, +BS, incisions well appearing, mild generalized TTP without guarding Skin: no rashes noted, warm and dry  Anti-infectives: Anti-infectives (From admission, onward)   Start     Dose/Rate Route Frequency Ordered Stop   11/21/17 1146  ceFAZolin (ANCEF) 2-4 GM/100ML-% IVPB    Comments:  Berna BueCochran, Ron   : cabinet override      11/21/17 1146 11/21/17 2359      Lab Results:  Recent Labs    11/24/17 0443 11/25/17 0430  WBC 5.4 4.9  HGB 10.6* 10.2*  HCT 33.1* 31.0*  PLT 368 368   BMET Recent Labs    11/25/17 0430 11/26/17 0504  NA 140 140  K 4.1 4.6  CL 111 111  CO2 20* 22  GLUCOSE 63* 87  BUN 9 5*   CREATININE 0.62 0.58  CALCIUM 8.2* 8.4*   PT/INR No results for input(s): LABPROT, INR in the last 72 hours. CMP     Component Value Date/Time   NA 140 11/26/2017 0504   K 4.6 11/26/2017 0504   CL 111 11/26/2017 0504   CO2 22 11/26/2017 0504   GLUCOSE 87 11/26/2017 0504   BUN 5 (L) 11/26/2017 0504   CREATININE 0.58 11/26/2017 0504   CREATININE 0.83 09/26/2015 0955   CALCIUM 8.4 (L) 11/26/2017 0504   PROT 6.3 (L) 11/22/2017 0429   ALBUMIN 3.3 (L) 11/22/2017 0429   AST 19 11/22/2017 0429   ALT 13 (L) 11/22/2017 0429   ALKPHOS 49 11/22/2017 0429   BILITOT 0.3 11/22/2017 0429   GFRNONAA >60 11/26/2017 0504   GFRAA >60 11/26/2017 0504   Lipase     Component Value Date/Time   LIPASE 26 11/20/2017 1407    Studies/Results: No results found.    Jerre SimonJessica L Luchiano Viscomi , [facility] 11/26/2017, 8:38 AM  Pager: [phone] Mon-Wed, Friday 7:00am-4:30pm Thurs 7am-11:30am  Consults: [phone]

## 2017-11-27 DIAGNOSIS — E033 Postinfectious hypothyroidism: Secondary | ICD-10-CM

## 2017-11-27 MED ORDER — OXYCODONE-ACETAMINOPHEN 5-325 MG PO TABS: 1.0000 | ORAL_TABLET | Freq: Three times a day (TID) | ORAL | 0 refills | Status: DC | PRN

## 2017-11-27 MED ORDER — APAP 325 MG PO TABS: 650.0000 mg | ORAL_TABLET | Freq: Four times a day (QID) | ORAL | 0 refills | Status: DC | PRN

## 2017-11-27 NOTE — Discharge Summary (Signed)
PATIENT DETAILS Name: Debbie Brandt Age: 57 y.o. Sex: female Date of Birth: 04/06/1961 MRN: [id number]. Admitting Physician: Onnie Boer, MD NWG:[id number], Dibas, MD  Admit Date: 11/20/2017 Discharge date: 11/27/2017  Recommendations for Outpatient Follow-up:  1. Follow up with PCP in 1-2 weeks 2. Please obtain BMP/CBC in one week 3. Please ensure follow-up with general surgery 4. Maxzide currently on hold-resume at follow-up if her blood pressure is stable.  Admitted From:  Home  Disposition: Home    Home Health: No  Equipment/Devices: None  Discharge Condition: Stable  CODE STATUS: FULL CODE/  Diet recommendation:  Heart Healthy -soft diet  Brief Summary: See H&P, Labs, Consult and Test reports for all details in brief, Patient is a 57 y.o. female with prior history of hypertension-prior history of small bowel obstruction requiring laparotomy with lysis of additions in February 2018-presented to the ED with abdominal pain, further evaluation revealed recurrent small bowel obstruction. Initially tried on conservative measures-unfortunately did not improve, and underwent laparoscopic abdominal exploration with lysis of adhesions on 3/8.  Brief Hospital Course: Small bowel obstruction: Underwent laparoscopic abdominal exploration and lysis of adhesions on 3/8-bowel function slowly returning-had BM approximately 2 days back-passing flatus. Diet being advanced-if she tolerates a soft diet this morning-she will be ischarge home later today. Seen by general surgery with no further recommendations-please ensure patient follows up with general surgery in the outpatient setting.  Hypertension: losartan/HCTZ resumed-she also appears to be on HCTZ and triamterene which is currently on hold. Please reassess and resume accordingly in the outpatient setting.  Hypothyroidism: continue levothyroxine.  Anemia: Mild-likely due to acute illness and IV fluid dilution-stable  for close outpatient follow-up.  Procedures/Studies: 3/8>>laparoscopic abdominal exploration with lysis of adhesions  CONSULTS: General surgery  Discharge Diagnoses:  Principal Problem:   Small bowel obstruction due to adhesions [facility]) Active Problems:   HTN (hypertension)   Hypothyroidism   Small bowel obstruction s/p ex lap & lysis of adhesions 10/28/2016   Hypokalemia   Discharge Instructions:  Activity:  As tolerated  Discharge Instructions    Call MD for:  persistant nausea and vomiting   Complete by:  As directed    Call MD for:  redness, tenderness, or signs of infection (pain, swelling, redness, odor or green/yellow discharge around incision site)   Complete by:  As directed    Call MD for:  severe uncontrolled pain   Complete by:  As directed    Diet - low sodium heart healthy   Complete by:  As directed    Stay on a soft diet for 3-5 days   Discharge instructions   Complete by:  As directed    Follow with Primary MD  Koirala, Dibas, MD  In 1 week  Follow with general surgery as instructed by them.  Please get a complete blood count and chemistry panel checked by your Primary MD at your next visit, and again as instructed by your Primary MD.  Get Medicines reviewed and adjusted: Please take all your medications with you for your next visit with your Primary MD  Laboratory/radiological data: Please request your Primary MD to go over all hospital tests and procedure/radiological results at the follow up, please ask your Primary MD to get all Hospital records sent to his/her office.  In some cases, they will be blood work, cultures and biopsy results pending at the time of your discharge. Please request that your primary care M.D. follows up on these results.  Also Note the following:  If you experience worsening of your admission symptoms, develop shortness of breath, life threatening emergency, suicidal or homicidal thoughts you must seek medical attention  immediately by calling 911 or calling your MD immediately  if symptoms less severe.  You must read complete instructions/literature along with all the possible adverse reactions/side effects for all the Medicines you take and that have been prescribed to you. Take any new Medicines after you have completely understood and accpet all the possible adverse reactions/side effects.   Do not drive when taking Pain medications or sleeping medications (Benzodaizepines)  Do not take more than prescribed Pain, Sleep and Anxiety Medications. It is not advisable to combine anxiety,sleep and pain medications without talking with your primary care practitioner  Special Instructions: If you have smoked or chewed Tobacco  in the last 2 yrs please stop smoking, stop any regular Alcohol  and or any Recreational drug use.  Wear Seat belts while driving.  Please note: You were cared for by a hospitalist during your hospital stay. Once you are discharged, your primary care physician will handle any further medical issues. Please note that NO REFILLS for any discharge medications will be authorized once you are discharged, as it is imperative that you return to your primary care physician (or establish a relationship with a primary care physician if you do not have one) for your post hospital discharge needs so that they can reassess your need for medications and monitor your lab values.   Increase activity slowly   Complete by:  As directed      Allergies as of 11/27/2017   No Known Allergies     Medication List    STOP taking these medications   triamterene-hydrochlorothiazide 37.5-25 MG tablet Commonly known as:  MAXZIDE-25     TAKE these medications   APAP 325 MG tablet Take 2 tablets (650 mg total) by mouth every 6 (six) hours as needed for pain. What changed:    how much to take  how to take this  when to take this  reasons to take this  additional instructions   Calcium Carbonate-Vitamin  D 600-400 MG-UNIT tablet Take 1 tablet by mouth daily.   docusate sodium 100 MG capsule Commonly known as:  COLACE Take 1 capsule (100 mg total) by mouth 2 (two) times daily.   levothyroxine 75 MCG tablet Commonly known as:  SYNTHROID, LEVOTHROID Take 75 mcg by mouth daily before breakfast.   losartan-hydrochlorothiazide 50-12.5 MG tablet Commonly known as:  HYZAAR Take 1 tablet by mouth daily.   MULTIPLE VITAMIN PO Take 1 tablet by mouth daily.   omeprazole 20 MG capsule Commonly known as:  PRILOSEC Take 1 capsule (20 mg total) by mouth daily.   oxyCODONE-acetaminophen 5-325 MG tablet Commonly known as:  PERCOCET/ROXICET Take 1-2 tablets by mouth every 8 (eight) hours as needed for moderate pain (unressponsive to acetaminophen).   TRAVATAN Z 0.004 % Soln ophthalmic solution Generic drug:  Travoprost (BAK Free) Place 1 drop into the right eye daily at 6 PM.      Follow-up Information    Almond LintByerly, Faera, MD Follow up.   Specialty:  General Surgery Why:  call for an appointment in 2 weeks. Contact information: [address] [place] [address] [phone]        Darrow BussingKoirala, Dibas, MD. Schedule an appointment as soon as possible for a visit in 1 week(s).   Specialty:  Family Medicine Contact information: [address] [place]  [address] [phone]          No Known Allergies   Other Procedures/Studies: Dg Abdomen 1 View  Result Date: 11/06/2017 CLINICAL DATA:  Evaluate nasogastric tube placement. EXAM: ABDOMEN - 1 VIEW COMPARISON:  Abdominal radiograph October 28, 2016 and CT abdomen and pelvis October 16, 2017 FINDINGS: Nasogastric tube courses in distribution stomach with distal tip projecting in mid to distal duodenum. Included abdomen demonstrates mildly distended small bowel. Stool and air in the included large bowel. IMPRESSION: Nasogastric tube tip projecting in mid to distal duodenum. Included abdomen demonstrates  mildly prominent small bowel, possible early small bowel obstruction. Electronically Signed   By: Awilda Metro M.D.   On: 11/06/2017 23:04   Ct Abdomen Pelvis W Contrast  Result Date: 11/20/2017 CLINICAL DATA:  Abdominal pain EXAM: CT ABDOMEN AND PELVIS WITH CONTRAST TECHNIQUE: Multidetector CT imaging of the abdomen and pelvis was performed using the standard protocol following bolus administration of intravenous contrast. CONTRAST:  ISOVUE-300 IOPAMIDOL (ISOVUE-300) INJECTION 61% COMPARISON:  Radiograph 228 2019, CT 10/16/2017, 10/23/2016, 10/22/2016 FINDINGS: Lower chest: Lung bases demonstrate no acute consolidation or pleural effusion. Borderline heart size. Hepatobiliary: No focal liver abnormality is seen. No gallstones, gallbladder wall thickening, or biliary dilatation. Pancreas: Unremarkable. No pancreatic ductal dilatation or surrounding inflammatory changes. Spleen: Normal in size without focal abnormality. Adrenals/Urinary Tract: Adrenal glands are within normal limits. No hydronephrosis. Cyst in the upper pole of the left kidney. Bladder unremarkable. Stomach/Bowel: Moderate enlargement of the stomach. Dilated loops of small bowel measuring up to 4.2 cm with fecalized segment of dilated small bowel in the mid pelvis. Transition point appears to be associated with crowded appearance of mesenteric vasculature with suggested mesenteric swirling pattern, series 2, image number 52. No colon wall thickening. Normal appendix. Distal small bowel is decompressed. Vascular/Lymphatic: Nonaneurysmal aorta. No significantly enlarged lymph nodes. Reproductive: Status post hysterectomy. No adnexal masses. Other: Negative for free air or free fluid. Musculoskeletal: Degenerative changes. No acute or suspicious lesion. IMPRESSION: 1. Dilated loops of small bowel with fecalized segment of small bowel in the right pelvis, consistent with mechanical small bowel obstruction. Transition point appears to be  within the upper pelvis where there is mesenteric vascular crowding and slight swirled appearance of the vasculature, either representing volvulus or obstruction related to internal hernia and raising concern for closed loop obstruction. 2. Otherwise no significant interval change compared to prior studies. Electronically Signed   By: Jasmine Pang M.D.   On: 11/20/2017 23:27   Dg Abd 2 Views  Result Date: 11/11/2017 CLINICAL DATA:  Abdominal distention.  Constipation. EXAM: ABDOMEN - 2 VIEW COMPARISON:  11/09/2017.  CT 10/16/2017. FINDINGS: Interval removal of NG tube. Distended loops of small bowel are noted. Similar findings noted on prior exam. Oral contrast in the colon. These findings suggest partial small bowel obstruction. No free air. Pelvic calcifications consistent phleboliths. No acute bony abnormality. IMPRESSION: 1.  Interim removal of NG tube. 2. Persistent distended loops of small bowel. Similar findings noted on prior exam. Oral contrast in the colon. These findings are consistent with partial small bowel obstruction. Continued follow-up exams to demonstrate resolution suggested. Electronically Signed   By: Maisie Fus  Register   On: 11/11/2017 11:58   Dg Abd Portable 1v  Result Date: 11/22/2017 CLINICAL DATA:  Small bowel obstruction EXAM: PORTABLE ABDOMEN - 1 VIEW COMPARISON:  11/21/2017 FINDINGS: NG tube is in the mid stomach. Gas and stool noted in the colon. Decreasing small bowel distention. No free air organomegaly.  Locules of gas noted along the left lateral portion of the image which appear to be within the subcutaneous soft tissues. This was not evident on prior plain film or CT. No acute bony abnormality. IMPRESSION: No current evidence for small bowel obstruction. Gas and stool noted within the colon. NG tube in the mid stomach. Left lateral abdominal wall and lower chest wall subcutaneous emphysema of unknown etiology. Electronically Signed   By: Charlett Nose M.D.   On: 11/22/2017  07:38   Dg Abd Portable 1v-small Bowel Protocol-position Verification  Result Date: 11/21/2017 CLINICAL DATA:  NG tube placement EXAM: PORTABLE ABDOMEN - 1 VIEW COMPARISON:  CT 11/20/2017, radiograph 11/13/2017 FINDINGS: Esophageal tube tip is in the left upper quadrant, side-port projects over the GE junction. Dilated loops of small bowel measuring up to 4.6 cm. Contrast within the renal collecting systems and bladder. IMPRESSION: 1. Esophageal tube tip projects over the stomach, side-port at the GE junction, suggest further advancement for more optimal positioning 2. Dilated loops of small bowel in the left abdomen concerning for a bowel obstruction Electronically Signed   By: Jasmine Pang M.D.   On: 11/21/2017 01:50   Dg Abd Portable 1v  Result Date: 11/13/2017 CLINICAL DATA:  Small-bowel obstruction EXAM: PORTABLE ABDOMEN - 1 VIEW COMPARISON:  11/11/2017. FINDINGS: Persistent but improved distention of small bowel loops. Oral contrast again noted the colon. Colon is nondistended. No free air. Pelvic calcification consistent phlebolith. No acute bony abnormality. IMPRESSION: Persistent but improved distention of small bowel loops. Colonic gas pattern is normal. Oral contrast in the colon. Continued follow-up exams suggested to demonstrate resolution of the residual small bowel distention. Electronically Signed   By: Maisie Fus  Register   On: 11/13/2017 07:19   Dg Abd Portable 1v  Result Date: 11/09/2017 CLINICAL DATA:  Abdominal pain EXAM: PORTABLE ABDOMEN - 1 VIEW COMPARISON:  T 12/03/2017 FINDINGS: Nasogastric tube tip remains in the gastric antrum region. Previously administered contrast remains within the right colon. Mild prominence of the small bowel gas pattern which could go along with mild ileus or partial small bowel obstruction. Not definitely abnormal however. IMPRESSION: Nasogastric tube tip in the gastric antrum. No progression of the hyperdense material in the right colon. Slight  prominence of the proximal small bowel which could be within normal limits or mildly abnormal as above. Electronically Signed   By: Paulina Fusi M.D.   On: 11/09/2017 06:28   Dg Abd Portable 1v  Result Date: 11/08/2017 CLINICAL DATA:  Nasogastric position. EXAM: PORTABLE ABDOMEN - 1 VIEW COMPARISON:  11/07/2017 FINDINGS: Nasogastric tube tip is in the antrum or pylorus region. Bowel gas pattern is normal. Previously administered contrast remains within the right colon. IMPRESSION: Nasogastric tube tip in the antrum or pylorus. Electronically Signed   By: Paulina Fusi M.D.   On: 11/08/2017 06:46   Dg Abd Portable 1v-small Bowel Obstruction Protocol-initial, 8 Hr Delay  Result Date: 11/07/2017 CLINICAL DATA:  8 hour delay, bowel obstruction EXAM: PORTABLE ABDOMEN - 1 VIEW COMPARISON:  11/06/2017, CT 10/16/2017 FINDINGS: Tip of the esophageal tube projects over the upper abdomen. Some retained contrast within pelvic small bowel loops. There is continued mild gaseous enlargement of left abdominal small bowel loops up to 3.6 cm. There is contrast present in the right colon and appendix. Calcified pelvic phleboliths. IMPRESSION: 1. Contrast in the right colon with mild retained contrast within pelvic small bowel loops 2. There is persistent mild dilatation of left abdominal small bowel, query mild ileus Electronically  Signed   By: Jasmine Pang M.D.   On: 11/07/2017 18:44     TODAY-DAY OF DISCHARGE:  Subjective:   Debbie Brandt today has no headache,no chest abdominal pain,no new weakness tingling or numbness, feels much better wants to go home today.   Objective:   Blood pressure 128/74, pulse (!) 58, temperature 98.6 F (37 C), temperature source Oral, resp. rate 15, height 5\' 2"  (1.575 m), weight 76.8 kg (169 lb 5 oz), SpO2 100 %.  Intake/Output Summary (Last 24 hours) at 11/27/2017 1007 Last data filed at 11/27/2017 0912 Gross per 24 hour  Intake 2376.67 ml  Output 2100 ml  Net 276.67 ml    Filed Weights   11/21/17 0426  Weight: 76.8 kg (169 lb 5 oz)    Exam: Awake Alert, Oriented *3, No new F.N deficits, Normal affect [place].AT,PERRAL Supple Neck,No JVD, No cervical lymphadenopathy appriciated.  Symmetrical Chest wall movement, Good air movement bilaterally, CTAB RRR,No Gallops,Rubs or new Murmurs, No Parasternal Heave +ve B.Sounds, Abd Soft, Non tender, No organomegaly appriciated, No rebound -guarding or rigidity. No Cyanosis, Clubbing or edema, No new Rash or bruise   PERTINENT RADIOLOGIC STUDIES: Dg Abdomen 1 View  Result Date: 11/06/2017 CLINICAL DATA:  Evaluate nasogastric tube placement. EXAM: ABDOMEN - 1 VIEW COMPARISON:  Abdominal radiograph October 28, 2016 and CT abdomen and pelvis October 16, 2017 FINDINGS: Nasogastric tube courses in distribution stomach with distal tip projecting in mid to distal duodenum. Included abdomen demonstrates mildly distended small bowel. Stool and air in the included large bowel. IMPRESSION: Nasogastric tube tip projecting in mid to distal duodenum. Included abdomen demonstrates mildly prominent small bowel, possible early small bowel obstruction. Electronically Signed   By: Awilda Metro M.D.   On: 11/06/2017 23:04   Ct Abdomen Pelvis W Contrast  Result Date: 11/20/2017 CLINICAL DATA:  Abdominal pain EXAM: CT ABDOMEN AND PELVIS WITH CONTRAST TECHNIQUE: Multidetector CT imaging of the abdomen and pelvis was performed using the standard protocol following bolus administration of intravenous contrast. CONTRAST:  ISOVUE-300 IOPAMIDOL (ISOVUE-300) INJECTION 61% COMPARISON:  Radiograph 228 2019, CT 10/16/2017, 10/23/2016, 10/22/2016 FINDINGS: Lower chest: Lung bases demonstrate no acute consolidation or pleural effusion. Borderline heart size. Hepatobiliary: No focal liver abnormality is seen. No gallstones, gallbladder wall thickening, or biliary dilatation. Pancreas: Unremarkable. No pancreatic ductal dilatation or surrounding  inflammatory changes. Spleen: Normal in size without focal abnormality. Adrenals/Urinary Tract: Adrenal glands are within normal limits. No hydronephrosis. Cyst in the upper pole of the left kidney. Bladder unremarkable. Stomach/Bowel: Moderate enlargement of the stomach. Dilated loops of small bowel measuring up to 4.2 cm with fecalized segment of dilated small bowel in the mid pelvis. Transition point appears to be associated with crowded appearance of mesenteric vasculature with suggested mesenteric swirling pattern, series 2, image number 52. No colon wall thickening. Normal appendix. Distal small bowel is decompressed. Vascular/Lymphatic: Nonaneurysmal aorta. No significantly enlarged lymph nodes. Reproductive: Status post hysterectomy. No adnexal masses. Other: Negative for free air or free fluid. Musculoskeletal: Degenerative changes. No acute or suspicious lesion. IMPRESSION: 1. Dilated loops of small bowel with fecalized segment of small bowel in the right pelvis, consistent with mechanical small bowel obstruction. Transition point appears to be within the upper pelvis where there is mesenteric vascular crowding and slight swirled appearance of the vasculature, either representing volvulus or obstruction related to internal hernia and raising concern for closed loop obstruction. 2. Otherwise no significant interval change compared to prior studies. Electronically Signed   By: Selena Batten  Jake Samples M.D.   On: 11/20/2017 23:27   Dg Abd 2 Views  Result Date: 11/11/2017 CLINICAL DATA:  Abdominal distention.  Constipation. EXAM: ABDOMEN - 2 VIEW COMPARISON:  11/09/2017.  CT 10/16/2017. FINDINGS: Interval removal of NG tube. Distended loops of small bowel are noted. Similar findings noted on prior exam. Oral contrast in the colon. These findings suggest partial small bowel obstruction. No free air. Pelvic calcifications consistent phleboliths. No acute bony abnormality. IMPRESSION: 1.  Interim removal of NG tube. 2.  Persistent distended loops of small bowel. Similar findings noted on prior exam. Oral contrast in the colon. These findings are consistent with partial small bowel obstruction. Continued follow-up exams to demonstrate resolution suggested. Electronically Signed   By: Maisie Fus  Register   On: 11/11/2017 11:58   Dg Abd Portable 1v  Result Date: 11/22/2017 CLINICAL DATA:  Small bowel obstruction EXAM: PORTABLE ABDOMEN - 1 VIEW COMPARISON:  11/21/2017 FINDINGS: NG tube is in the mid stomach. Gas and stool noted in the colon. Decreasing small bowel distention. No free air organomegaly. Locules of gas noted along the left lateral portion of the image which appear to be within the subcutaneous soft tissues. This was not evident on prior plain film or CT. No acute bony abnormality. IMPRESSION: No current evidence for small bowel obstruction. Gas and stool noted within the colon. NG tube in the mid stomach. Left lateral abdominal wall and lower chest wall subcutaneous emphysema of unknown etiology. Electronically Signed   By: Charlett Nose M.D.   On: 11/22/2017 07:38   Dg Abd Portable 1v-small Bowel Protocol-position Verification  Result Date: 11/21/2017 CLINICAL DATA:  NG tube placement EXAM: PORTABLE ABDOMEN - 1 VIEW COMPARISON:  CT 11/20/2017, radiograph 11/13/2017 FINDINGS: Esophageal tube tip is in the left upper quadrant, side-port projects over the GE junction. Dilated loops of small bowel measuring up to 4.6 cm. Contrast within the renal collecting systems and bladder. IMPRESSION: 1. Esophageal tube tip projects over the stomach, side-port at the GE junction, suggest further advancement for more optimal positioning 2. Dilated loops of small bowel in the left abdomen concerning for a bowel obstruction Electronically Signed   By: Jasmine Pang M.D.   On: 11/21/2017 01:50   Dg Abd Portable 1v  Result Date: 11/13/2017 CLINICAL DATA:  Small-bowel obstruction EXAM: PORTABLE ABDOMEN - 1 VIEW COMPARISON:  11/11/2017.  FINDINGS: Persistent but improved distention of small bowel loops. Oral contrast again noted the colon. Colon is nondistended. No free air. Pelvic calcification consistent phlebolith. No acute bony abnormality. IMPRESSION: Persistent but improved distention of small bowel loops. Colonic gas pattern is normal. Oral contrast in the colon. Continued follow-up exams suggested to demonstrate resolution of the residual small bowel distention. Electronically Signed   By: Maisie Fus  Register   On: 11/13/2017 07:19   Dg Abd Portable 1v  Result Date: 11/09/2017 CLINICAL DATA:  Abdominal pain EXAM: PORTABLE ABDOMEN - 1 VIEW COMPARISON:  T 12/03/2017 FINDINGS: Nasogastric tube tip remains in the gastric antrum region. Previously administered contrast remains within the right colon. Mild prominence of the small bowel gas pattern which could go along with mild ileus or partial small bowel obstruction. Not definitely abnormal however. IMPRESSION: Nasogastric tube tip in the gastric antrum. No progression of the hyperdense material in the right colon. Slight prominence of the proximal small bowel which could be within normal limits or mildly abnormal as above. Electronically Signed   By: Paulina Fusi M.D.   On: 11/09/2017 06:28   Dg Abd Portable  1v  Result Date: 11/08/2017 CLINICAL DATA:  Nasogastric position. EXAM: PORTABLE ABDOMEN - 1 VIEW COMPARISON:  11/07/2017 FINDINGS: Nasogastric tube tip is in the antrum or pylorus region. Bowel gas pattern is normal. Previously administered contrast remains within the right colon. IMPRESSION: Nasogastric tube tip in the antrum or pylorus. Electronically Signed   By: Paulina Fusi M.D.   On: 11/08/2017 06:46   Dg Abd Portable 1v-small Bowel Obstruction Protocol-initial, 8 Hr Delay  Result Date: 11/07/2017 CLINICAL DATA:  8 hour delay, bowel obstruction EXAM: PORTABLE ABDOMEN - 1 VIEW COMPARISON:  11/06/2017, CT 10/16/2017 FINDINGS: Tip of the esophageal tube projects over the upper  abdomen. Some retained contrast within pelvic small bowel loops. There is continued mild gaseous enlargement of left abdominal small bowel loops up to 3.6 cm. There is contrast present in the right colon and appendix. Calcified pelvic phleboliths. IMPRESSION: 1. Contrast in the right colon with mild retained contrast within pelvic small bowel loops 2. There is persistent mild dilatation of left abdominal small bowel, query mild ileus Electronically Signed   By: Jasmine Pang M.D.   On: 11/07/2017 18:44     PERTINENT LAB RESULTS: CBC: Recent Labs    11/25/17 0430  WBC 4.9  HGB 10.2*  HCT 31.0*  PLT 368   CMET CMP     Component Value Date/Time   NA 140 11/26/2017 0504   K 4.6 11/26/2017 0504   CL 111 11/26/2017 0504   CO2 22 11/26/2017 0504   GLUCOSE 87 11/26/2017 0504   BUN 5 (L) 11/26/2017 0504   CREATININE 0.58 11/26/2017 0504   CREATININE 0.83 09/26/2015 0955   CALCIUM 8.4 (L) 11/26/2017 0504   PROT 6.3 (L) 11/22/2017 0429   ALBUMIN 3.3 (L) 11/22/2017 0429   AST 19 11/22/2017 0429   ALT 13 (L) 11/22/2017 0429   ALKPHOS 49 11/22/2017 0429   BILITOT 0.3 11/22/2017 0429   GFRNONAA >60 11/26/2017 0504   GFRAA >60 11/26/2017 0504    GFR Estimated Creatinine Clearance: 73.6 mL/min (by C-G formula based on SCr of 0.58 mg/dL). No results for input(s): LIPASE, AMYLASE in the last 72 hours. No results for input(s): CKTOTAL, CKMB, CKMBINDEX, TROPONINI in the last 72 hours. Invalid input(s): POCBNP No results for input(s): DDIMER in the last 72 hours. No results for input(s): HGBA1C in the last 72 hours. No results for input(s): CHOL, HDL, LDLCALC, TRIG, CHOLHDL, LDLDIRECT in the last 72 hours. No results for input(s): TSH, T4TOTAL, T3FREE, THYROIDAB in the last 72 hours.  Invalid input(s): FREET3 No results for input(s): VITAMINB12, FOLATE, FERRITIN, TIBC, IRON, RETICCTPCT in the last 72 hours. Coags: No results for input(s): INR in the last 72 hours.  Invalid input(s):  PT Microbiology: No results found for this or any previous visit (from the past 240 hour(s)).  FURTHER DISCHARGE INSTRUCTIONS:  Get Medicines reviewed and adjusted: Please take all your medications with you for your next visit with your Primary MD  Laboratory/radiological data: Please request your Primary MD to go over all hospital tests and procedure/radiological results at the follow up, please ask your Primary MD to get all Hospital records sent to his/her office.  In some cases, they will be blood work, cultures and biopsy results pending at the time of your discharge. Please request that your primary care M.D. goes through all the records of your hospital data and follows up on these results.  Also Note the following: If you experience worsening of your admission symptoms, develop shortness of  breath, life threatening emergency, suicidal or homicidal thoughts you must seek medical attention immediately by calling 911 or calling your MD immediately  if symptoms less severe.  You must read complete instructions/literature along with all the possible adverse reactions/side effects for all the Medicines you take and that have been prescribed to you. Take any new Medicines after you have completely understood and accpet all the possible adverse reactions/side effects.   Do not drive when taking Pain medications or sleeping medications (Benzodaizepines)  Do not take more than prescribed Pain, Sleep and Anxiety Medications. It is not advisable to combine anxiety,sleep and pain medications without talking with your primary care practitioner  Special Instructions: If you have smoked or chewed Tobacco  in the last 2 yrs please stop smoking, stop any regular Alcohol  and or any Recreational drug use.  Wear Seat belts while driving.  Please note: You were cared for by a hospitalist during your hospital stay. Once you are discharged, your primary care physician will handle any further medical  issues. Please note that NO REFILLS for any discharge medications will be authorized once you are discharged, as it is imperative that you return to your primary care physician (or establish a relationship with a primary care physician if you do not have one) for your post hospital discharge needs so that they can reassess your need for medications and monitor your lab values.  Total Time spent coordinating discharge including counseling, education and face to face time equals 45 minutes.  SignedJeoffrey Massed 11/27/2017 10:07 AM

## 2017-11-27 NOTE — Progress Notes (Signed)
[facility] Progress Note  6 Days Post-Op   Assessment/Plan Small bowel obstruction due to adhesions [facility](HCC) Active Problems: HTN (hypertension) Hypothyroidism Small bowel obstruction s/p ex lap &lysis of adhesions 10/28/2016 Hypokalemia  SBO - S/P laparoscopic lysis of adhesions, 03/08, Dr. Donell BeersByerly - having flatus, advance diet to soft  FEN:IV fluids/soft VTE: SCD's,lovenox ZO:XWRUEAVWU:Cefotetan once 03/08 Foley:none Follow up:2 weeksDr. Donell BeersByerly   Plan: soft, encourage ambulation. Okay for discharge from a surgical standpoint if she tolerates a soft diet      LOS: 6 days    Subjective: CC: S/P LOA  No abdominal pain. Having flatus. Had a BM yesterday. No nausea or vomiting. Tolerated fulls. Lots of belching this am. No new complaints.  Objective: Vital signs in last 24 hours: Temp:  [97.9 F (36.6 C)-98.8 F (37.1 C)] 98.8 F (37.1 C) (03/14 0539) Pulse Rate:  [58-81] 58 (03/14 0539) Resp:  [15-16] 15 (03/14 0539) BP: (131-143)/(61-74) 131/61 (03/14 0539) SpO2:  [100 %] 100 % (03/14 0539) Last BM Date: 11/25/17  Intake/Output from previous day: 03/13 0701 - 03/14 0700 In: 2941.7 [P.O.:1390; I.V.:1551.7] Out: 2100 [Urine:2100] Intake/Output this shift: No intake/output data recorded.  PE: Gen: Alert, NAD, pleasant, cooperative Pulm:Rate andeffort normal Abd: Soft,not distended,+BS, incisions well appearing, no TTP Skin: no rashes noted, warm and dry   Anti-infectives: Anti-infectives (From admission, onward)   Start     Dose/Rate Route Frequency Ordered Stop   11/21/17 1146  ceFAZolin (ANCEF) 2-4 GM/100ML-% IVPB    Comments:  Berna BueCochran, Ron   : cabinet override      11/21/17 1146 11/21/17 2359      Lab Results:  Recent Labs    11/25/17 0430  WBC 4.9  HGB 10.2*  HCT 31.0*  PLT 368   BMET Recent Labs    11/25/17 0430 11/26/17 0504  NA 140 140  K 4.1 4.6  CL 111 111  CO2 20* 22  GLUCOSE 63* 87  BUN  9 5*  CREATININE 0.62 0.58  CALCIUM 8.2* 8.4*   PT/INR No results for input(s): LABPROT, INR in the last 72 hours. CMP     Component Value Date/Time   NA 140 11/26/2017 0504   K 4.6 11/26/2017 0504   CL 111 11/26/2017 0504   CO2 22 11/26/2017 0504   GLUCOSE 87 11/26/2017 0504   BUN 5 (L) 11/26/2017 0504   CREATININE 0.58 11/26/2017 0504   CREATININE 0.83 09/26/2015 0955   CALCIUM 8.4 (L) 11/26/2017 0504   PROT 6.3 (L) 11/22/2017 0429   ALBUMIN 3.3 (L) 11/22/2017 0429   AST 19 11/22/2017 0429   ALT 13 (L) 11/22/2017 0429   ALKPHOS 49 11/22/2017 0429   BILITOT 0.3 11/22/2017 0429   GFRNONAA >60 11/26/2017 0504   GFRAA >60 11/26/2017 0504   Lipase     Component Value Date/Time   LIPASE 26 11/20/2017 1407    Studies/Results: No results found.    Jerre SimonJessica L Kalab Camps , [facility] 11/27/2017, 8:23 AM  Pager: [phone] Mon-Wed, Friday 7:00am-4:30pm Thurs 7am-11:30am  Consults: [phone]

## 2017-11-27 NOTE — Progress Notes (Signed)
Discharge instructions reviewed with patient. All questions answered. Patient wheeled down to vehicle with belongings by nurse tech. 

## 2017-12-08 IMAGING — DX DG ABDOMEN 2V
2 series · 2 of 2 positions shown · non-contrast
Comparison: 10/25/2016

CLINICAL DATA: Abdominal pain.  Follow-up small bowel obstruction

EXAM:
ABDOMEN - 2 VIEW

[abdomen erect]
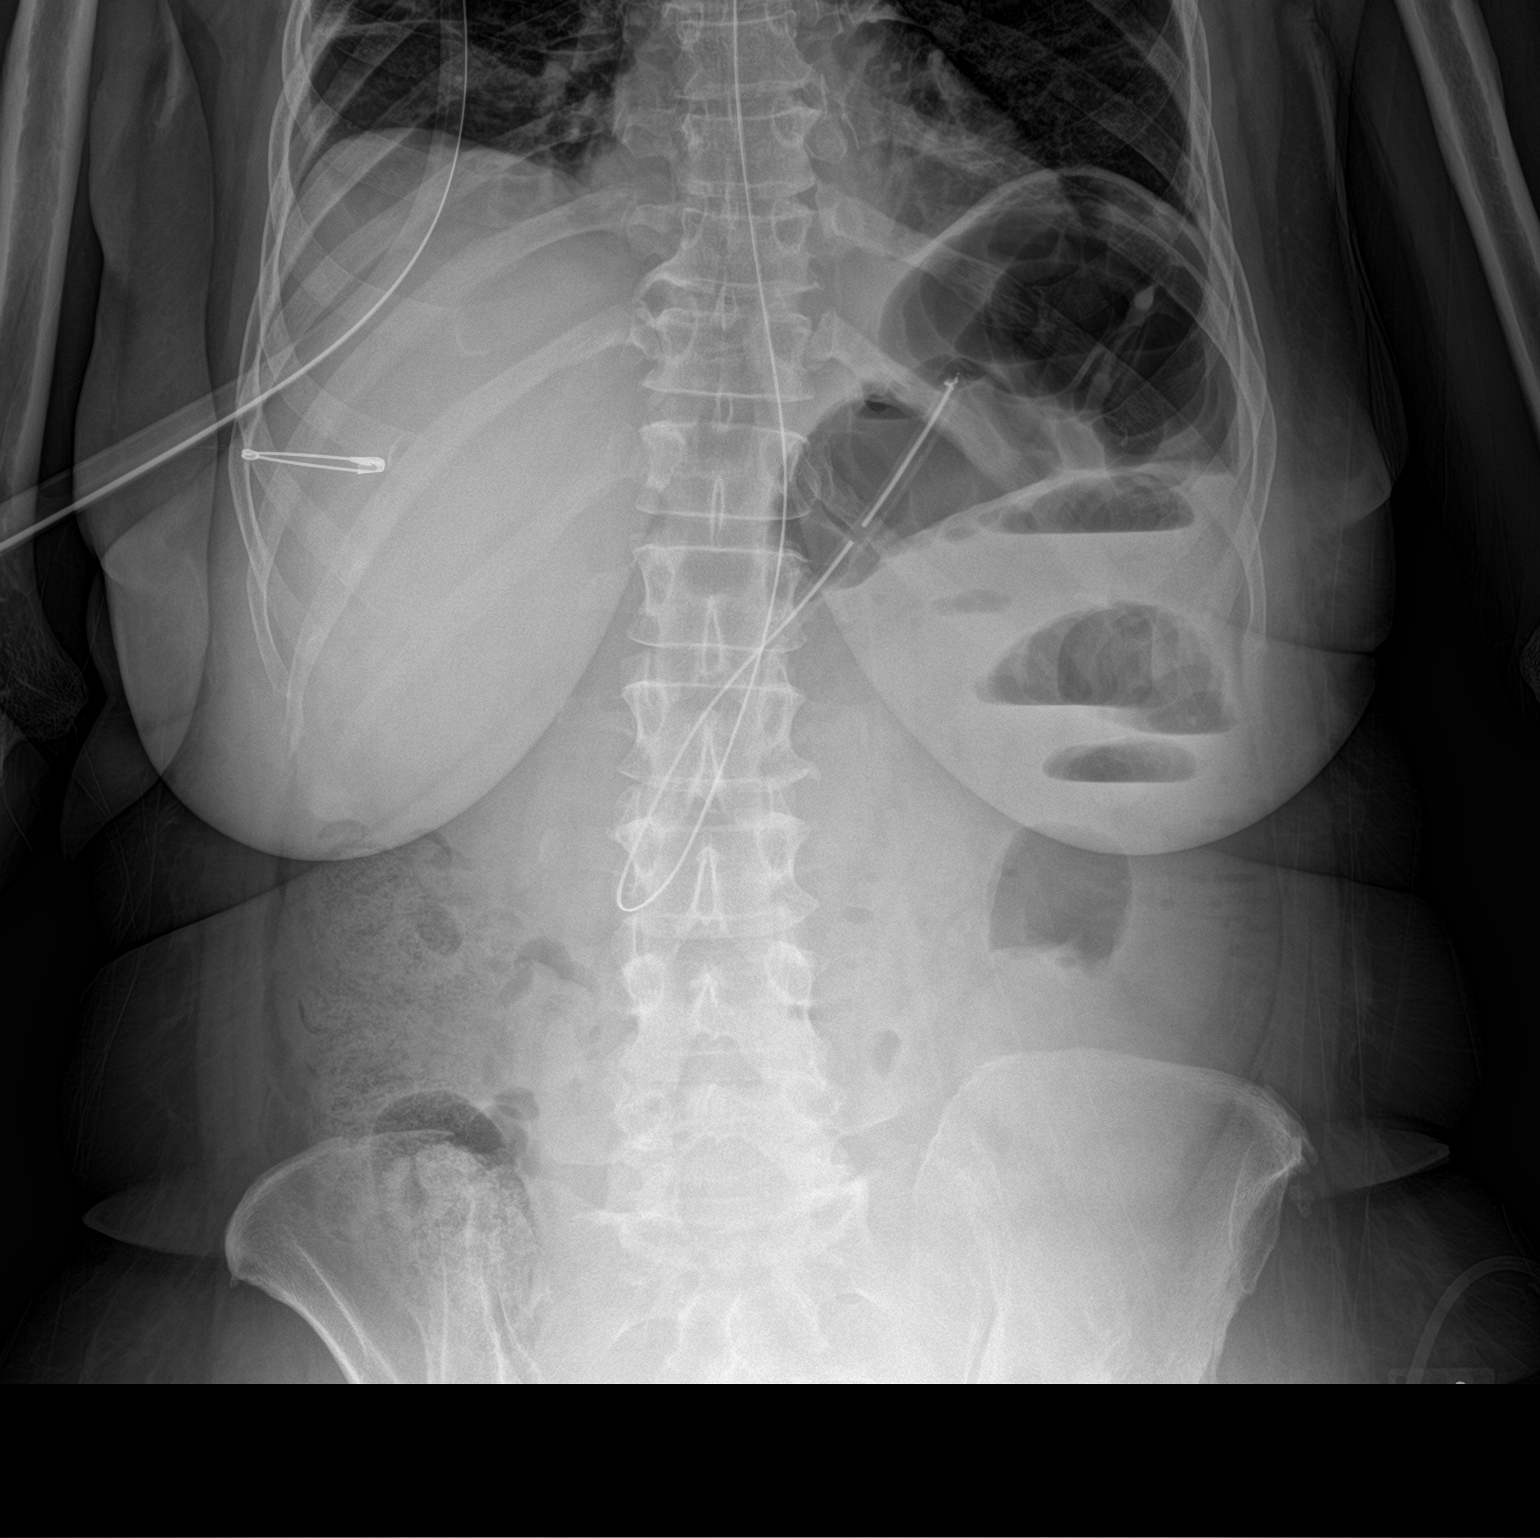

[abdomen supine]
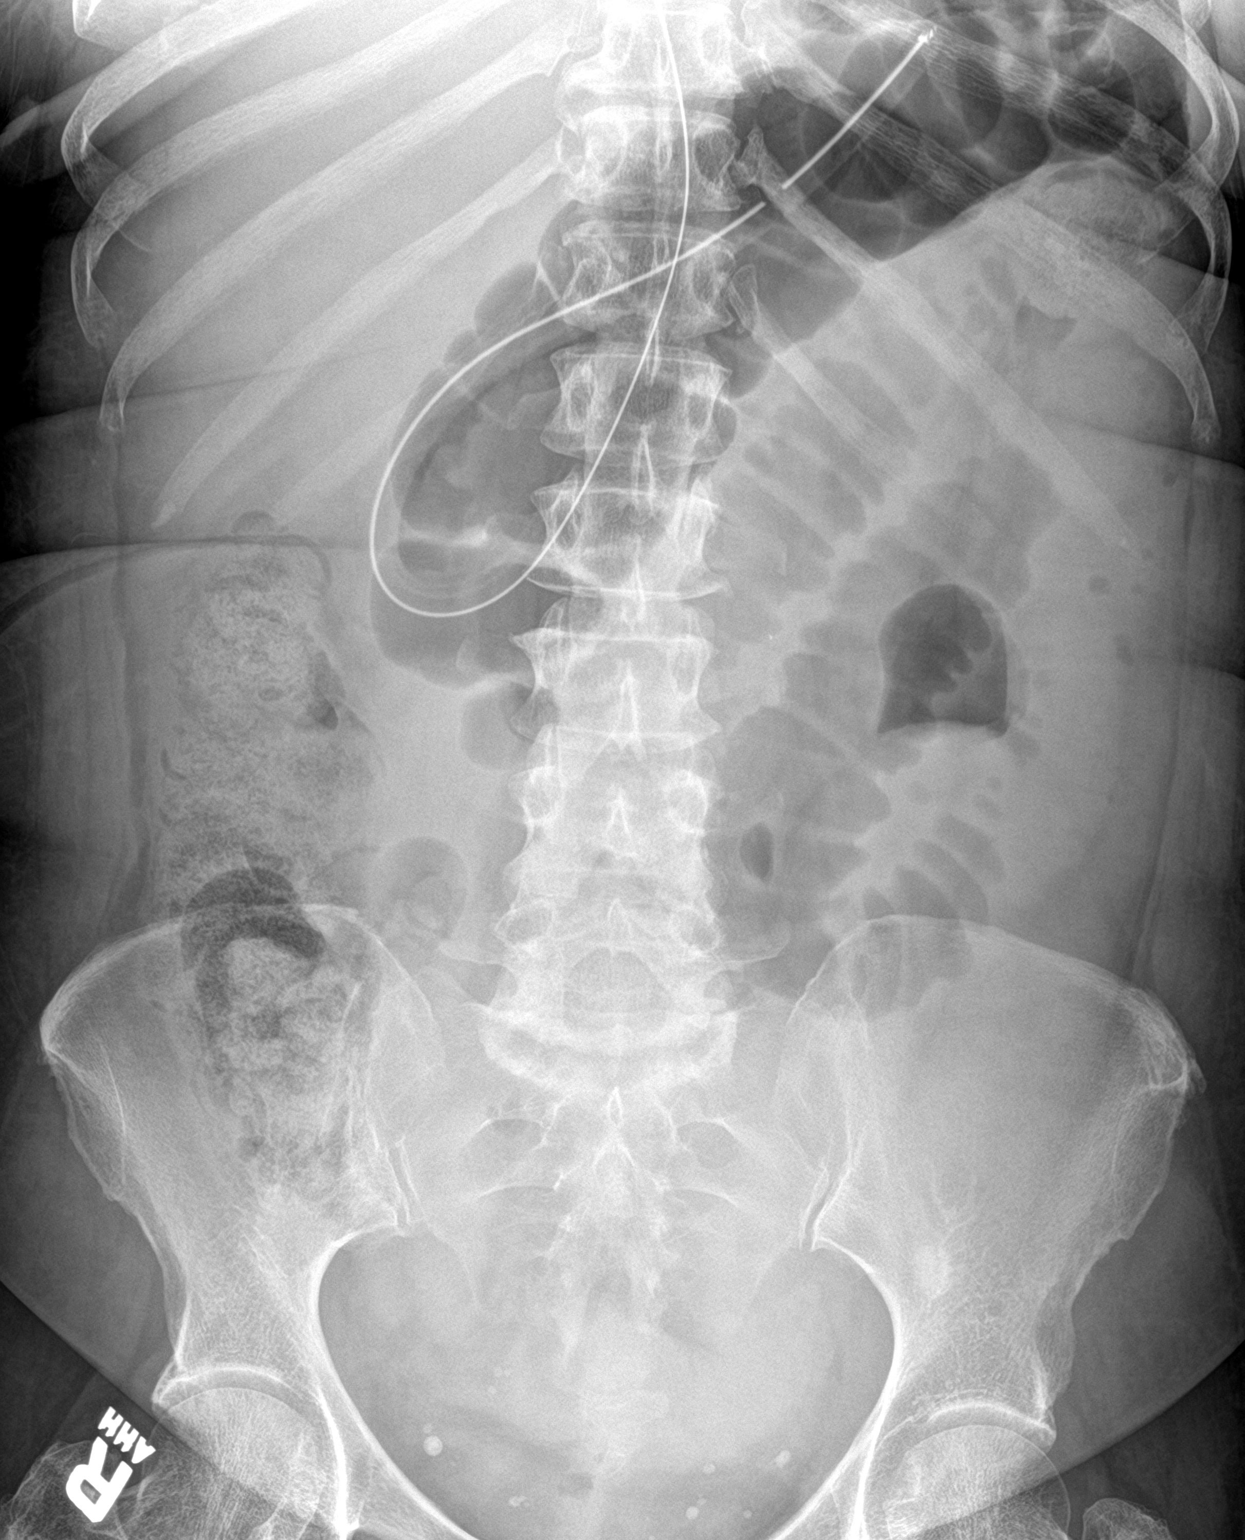

[2 of 2 positions shown; findings below may reference images not displayed]

FINDINGS: NG tube remains coiled in the stomach. Continued dilated small bowel
loops, not significantly changed. No free air organomegaly.
IMPRESSION: Stable small bowel obstruction pattern.

## 2017-12-10 IMAGING — DX DG ABDOMEN 2V
3 series · 3 of 3 positions shown · non-contrast
Comparison: 10/27/2016

CLINICAL DATA: Small bowel obstruction, hysterectomy

EXAM:
ABDOMEN - 2 VIEW

[abdomen erect]
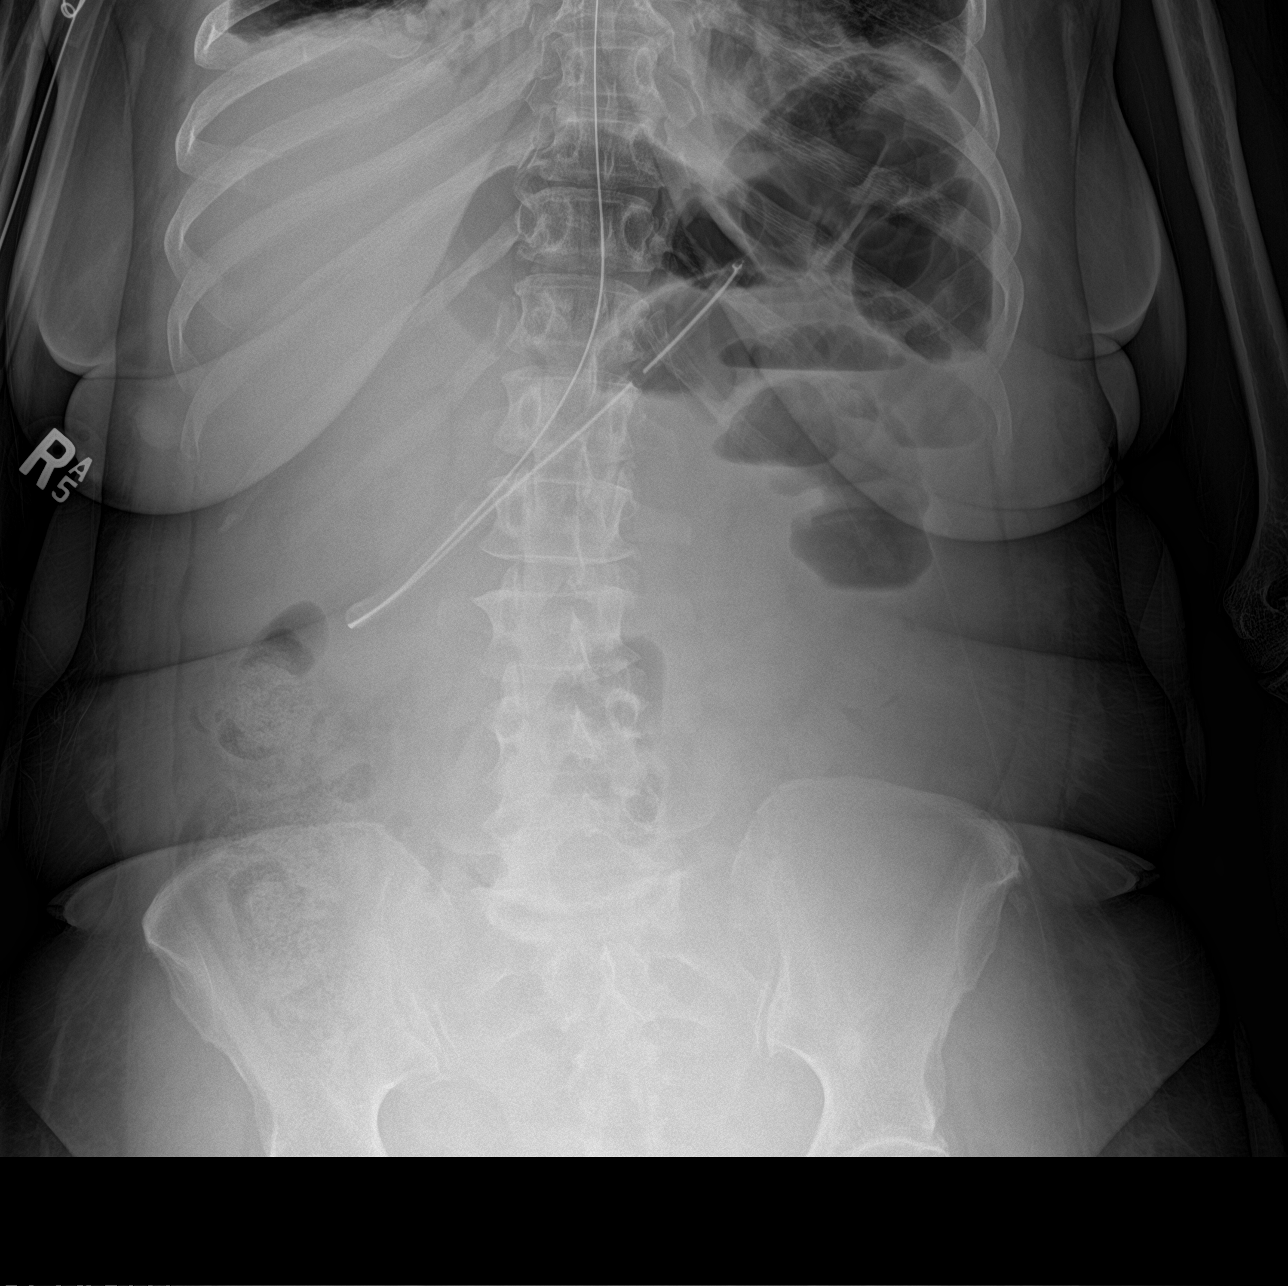

[abdomen supine (1 of 2)]
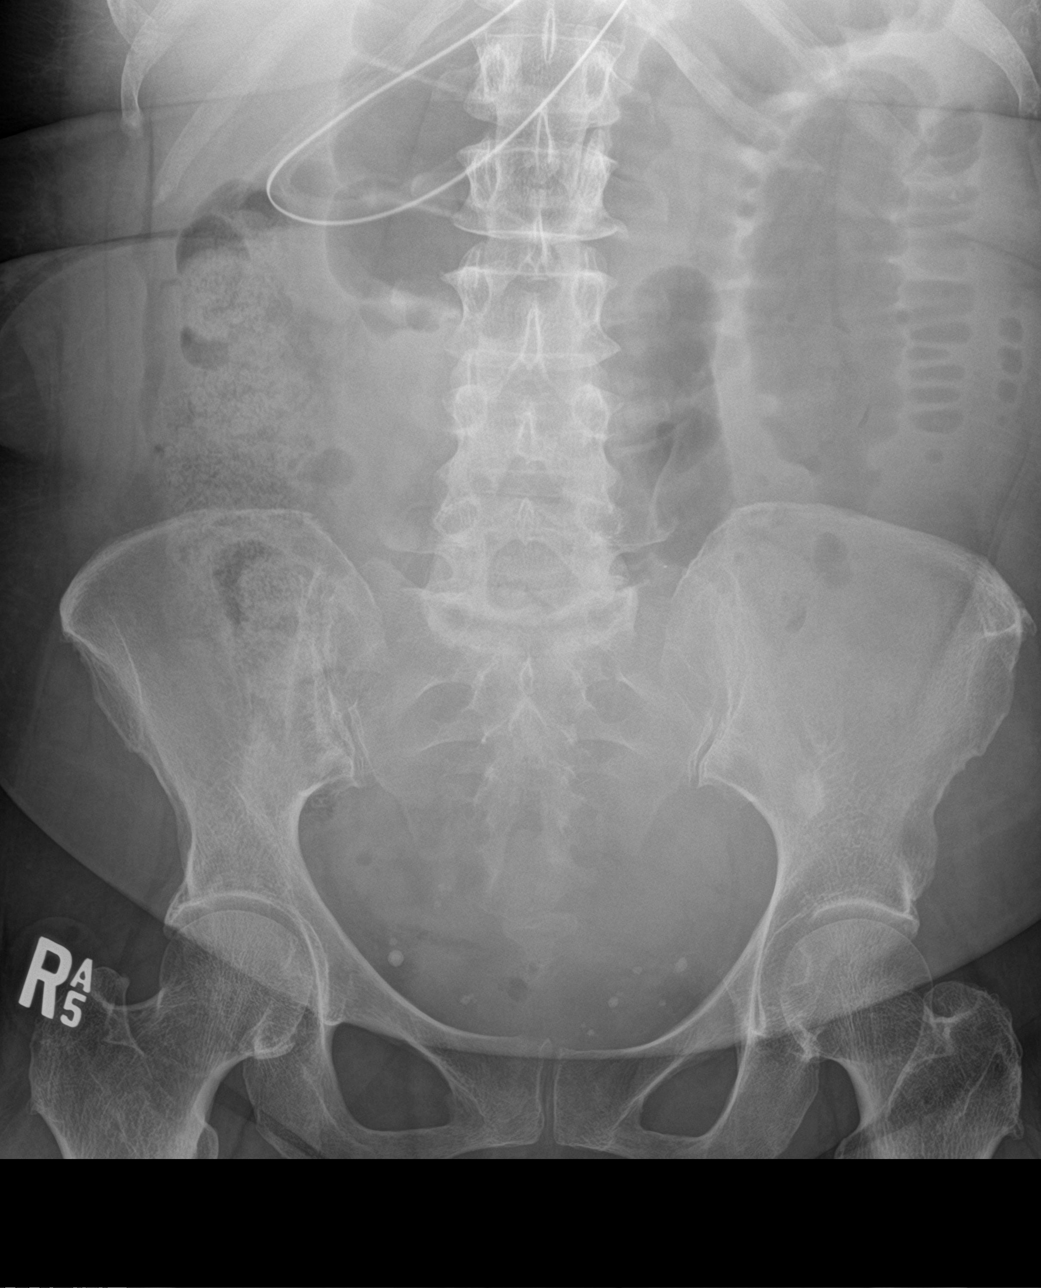

[abdomen supine (2 of 2)]
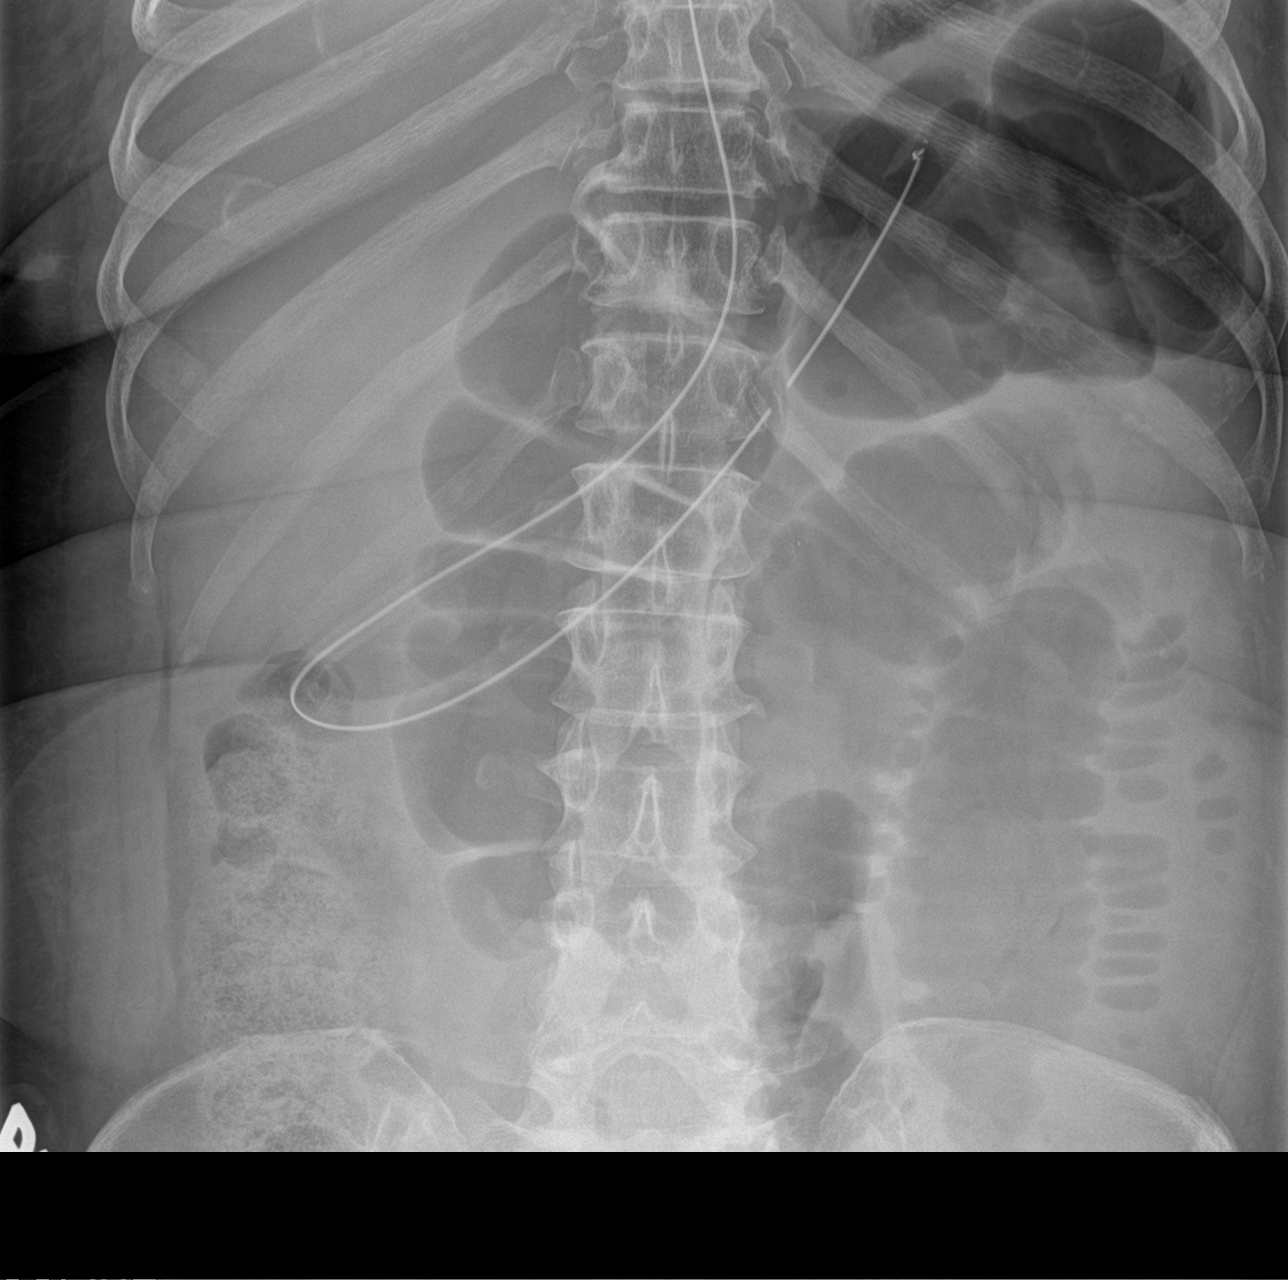

[3 of 3 positions shown; findings below may reference images not displayed]

FINDINGS: Persistent distended small bowel loops in left upper abdomen with
air-fluid levels consistent with small bowel obstruction. NG tube
coiled within distal stomach with tip in proximal stomach. Some
colonic stool noted in right colon.
IMPRESSION: Persistent small bowel obstruction pattern. NG tube coiled within
distal stomach with tip in proximal stomach.

## 2019-07-30 ENCOUNTER — Encounter: Payer: Self-pay | Admitting: Obstetrics & Gynecology

## 2019-07-30 ENCOUNTER — Other Ambulatory Visit: Payer: Self-pay

## 2019-07-30 ENCOUNTER — Ambulatory Visit (INDEPENDENT_AMBULATORY_CARE_PROVIDER_SITE_OTHER): Payer: Managed Care, Other (non HMO) | Admitting: Obstetrics & Gynecology

## 2019-07-30 VITALS — BP 140/86 | Ht 61.5 in | Wt 169.2 lb

## 2019-07-30 DIAGNOSIS — Z1272 Encounter for screening for malignant neoplasm of vagina: Secondary | ICD-10-CM

## 2019-07-30 DIAGNOSIS — Z90710 Acquired absence of both cervix and uterus: Secondary | ICD-10-CM | POA: Diagnosis not present

## 2019-07-30 DIAGNOSIS — Z01419 Encounter for gynecological examination (general) (routine) without abnormal findings: Secondary | ICD-10-CM | POA: Diagnosis not present

## 2019-07-30 DIAGNOSIS — E6609 Other obesity due to excess calories: Secondary | ICD-10-CM | POA: Diagnosis not present

## 2019-07-30 DIAGNOSIS — Z780 Asymptomatic menopausal state: Secondary | ICD-10-CM | POA: Diagnosis not present

## 2019-07-30 DIAGNOSIS — Z6831 Body mass index (BMI) 31.0-31.9, adult: Secondary | ICD-10-CM

## 2019-07-30 NOTE — Progress Notes (Signed)
Debbie Brandt 1961/07/30 [id number]   History:    58 y.o. G1P1L0 Single  RP:  New patient presenting for annual gyn exam   HPI: S/P Total Hysterectomy for Fibroids.  Postmenopause, well on no HRT.  No pelvic pain.  Abstinent.  Urine/BMs normal.  Breasts normal.  BMI 31.45.  No physical activities, except for work.  Health labs with Fam MD.  Past medical history,surgical history, family history and social history were all reviewed and documented in the EPIC chart.  Gynecologic History No LMP recorded. Patient has had a hysterectomy. Contraception: status post hysterectomy Last Pap: No recent Pap Last mammogram: 2013, will schedule now Bone Density: Never Colonoscopy: 2020 per patient  Obstetric History OB History  Gravida Para Term Preterm AB Living  1 1       0  SAB TAB Ectopic Multiple Live Births               # Outcome Date GA Lbr Len/2nd Weight Sex Delivery Anes PTL Lv  1 Para             Obstetric Comments  Passed away at 19      ROS: A ROS was performed and pertinent positives and negatives are included in the history.  GENERAL: No fevers or chills. HEENT: No change in vision, no earache, sore throat or sinus congestion. NECK: No pain or stiffness. CARDIOVASCULAR: No chest pain or pressure. No palpitations. PULMONARY: No shortness of breath, cough or wheeze. GASTROINTESTINAL: No abdominal pain, nausea, vomiting or diarrhea, melena or bright red blood per rectum. GENITOURINARY: No urinary frequency, urgency, hesitancy or dysuria. MUSCULOSKELETAL: No joint or muscle pain, no back pain, no recent trauma. DERMATOLOGIC: No rash, no itching, no lesions. ENDOCRINE: No polyuria, polydipsia, no heat or cold intolerance. No recent change in weight. HEMATOLOGICAL: No anemia or easy bruising or bleeding. NEUROLOGIC: No headache, seizures, numbness, tingling or weakness. PSYCHIATRIC: No depression, no loss of interest in normal activity or change in sleep pattern.     Exam:    BP 140/86   Ht 5' 1.5" (1.562 m)   Wt 169 lb 3.2 oz (76.7 kg)   BMI 31.45 kg/m   Body mass index is 31.45 kg/m.  General appearance : Well developed well nourished female. No acute distress HEENT: Eyes: no retinal hemorrhage or exudates,  Neck supple, trachea midline, no carotid bruits, no thyroidmegaly Lungs: Clear to auscultation, no rhonchi or wheezes, or rib retractions  Heart: Regular rate and rhythm, no murmurs or gallops Breast:Examined in sitting and supine position were symmetrical in appearance, no palpable masses or tenderness,  no skin retraction, no nipple inversion, no nipple discharge, no skin discoloration, no axillary or supraclavicular lymphadenopathy Abdomen: no palpable masses or tenderness, no rebound or guarding Extremities: no edema or skin discoloration or tenderness  Pelvic: Vulva: Normal             Vagina: No gross lesions or discharge.  Pap reflex done  Cervix/Uterus absent  Adnexa  Without masses or tenderness  Anus: Normal   Assessment/Plan:  58 y.o. female for annual exam   1. Encounter for Papanicolaou smear of vagina as part of routine gynecological examination Gynecologic exam status post total hysterectomy and menopause.  Pap reflex done on the vaginal vault.  Breast exam normal.  Will schedule a screening mammogram now.  Per patient colonoscopy 2020.  Health labs with family physician.  2. S/P total hysterectomy  3. Postmenopause Well on no hormone replacement therapy.  Recommend vitamin D supplements, calcium intake of 1200 mg daily and regular weightbearing physical activities.  4. Class 1 obesity due to excess calories without serious comorbidity with body mass index (BMI) of 31.0 to 31.9 in adult Recommend a lower calorie/carb diet such as [employer].  Aerobic physical activities 5 times a week and weightlifting every 2 days.  Genia Del MD, 11:27 AM 07/30/2019

## 2019-07-30 NOTE — Patient Instructions (Addendum)
1. Encounter for Papanicolaou smear of vagina as part of routine gynecological examination Gynecologic exam status post total hysterectomy and menopause.  Pap reflex done on the vaginal vault.  Breast exam normal.  Will schedule a screening mammogram now.  Per patient colonoscopy 2020.  Health labs with family physician.  2. S/P total hysterectomy  3. Postmenopause Well on no hormone replacement therapy.  Recommend vitamin D supplements, calcium intake of 1200 mg daily and regular weightbearing physical activities.  4. Class 1 obesity due to excess calories without serious comorbidity with body mass index (BMI) of 31.0 to 31.9 in adult Recommend a lower calorie/carb diet such as [employer].  Aerobic physical activities 5 times a week and weightlifting every 2 days.  Debbie Brandt, it was a pleasure meeting you today!  I will inform you of your results as soon as they are available.

## 2019-07-30 NOTE — Addendum Note (Signed)
Addended by: Thurnell Garbe A on: 07/30/2019 11:49 AM   Modules accepted: Orders

## 2019-08-02 LAB — PAP IG W/ RFLX HPV ASCU

## 2019-09-21 ENCOUNTER — Other Ambulatory Visit: Payer: Self-pay

## 2019-09-21 ENCOUNTER — Ambulatory Visit: Payer: Managed Care, Other (non HMO) | Attending: Family Medicine | Admitting: Physical Therapy

## 2019-09-21 ENCOUNTER — Encounter: Payer: Self-pay | Admitting: Physical Therapy

## 2019-09-21 DIAGNOSIS — M25551 Pain in right hip: Secondary | ICD-10-CM | POA: Diagnosis present

## 2019-09-21 DIAGNOSIS — M545 Low back pain, unspecified: Secondary | ICD-10-CM

## 2019-09-21 DIAGNOSIS — M6281 Muscle weakness (generalized): Secondary | ICD-10-CM

## 2019-09-21 NOTE — Patient Instructions (Signed)
Access Code: [email or web address]  URL: [email or web address]  Date: 09/21/2019  Prepared by: Lavinia Sharps   Exercises Prone Press Up - 10 reps - 1 sets - 4-5x daily - 7x weekly Standing Lumbar Extension - 10 reps - 1 sets - 4-5x daily - 7x weekly

## 2019-09-21 NOTE — Therapy (Signed)
[facility] [address], [address] [place], [place], [address] Phone: [phone]   Fax:  [phone]  Physical Therapy Evaluation  Patient Details  Name: Debbie Brandt MRN: [id number] Date of Birth: 1961-03-28 Referring Provider (PT): Dr. Darrow Bussing   Encounter Date: 09/21/2019  PT End of Session - 09/21/19 1946    Visit Number  1    Date for PT Re-Evaluation  11/16/19    Authorization Type  [employer]    PT Start Time  1145    PT Stop Time  1227    PT Time Calculation (min)  42 min    Activity Tolerance  Patient tolerated treatment well       Past Medical History:  Diagnosis Date  . Chest pain 09/26/2015  . Essential hypertension 09/26/2015  . GERD (gastroesophageal reflux disease)   . Hypertension   . Hypothyroidism 09/26/2015  . Murmur 09/26/2015  . Thyroid disease     Past Surgical History:  Procedure Laterality Date  . ABDOMINAL HYSTERECTOMY    . CESAREAN SECTION    . LAPAROSCOPIC ABDOMINAL EXPLORATION N/A 11/21/2017   Procedure: LAPAROSCOPIC ABDOMINAL EXPLORATION LYSIS OF ADHESIONS;  Surgeon: Almond Lint, MD;  Location: [facility];  Service: General;  Laterality: N/A;  . LAPAROTOMY N/A 10/28/2016   Procedure: EXPLORATORY LAPAROTOMY WITH  LYSIS OF ADHESIONS;  Surgeon: Avel Peace, MD;  Location: [facility];  Service: General;  Laterality: N/A;    There were no vitals filed for this visit.   Subjective Assessment - 09/21/19 1151    Subjective  Right low back pain for no apparent pain several months ago.    Pertinent History  no previous history of LBP    Limitations  House hold activities;Walking    How long can you sit comfortably?  5-10 minutes    How long can you stand comfortably?  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  as long as I want    Diagnostic tests  none    Patient Stated Goals  stop hurting    Currently in Pain?  Yes    Pain Score  5     Pain  Location  Back    Pain Orientation  Right    Pain Type  Acute pain    Pain Frequency  Intermittent    Aggravating Factors   I can't figure how what aggravates it;  right sidelying; maybe lifting heavy things at the grocery store    Pain Relieving Factors  patch;  heat         [facility] PT Assessment - 09/21/19 0001      Assessment   Medical Diagnosis  low back pain     Referring Provider (PT)  Dr. Carilyn Goodpasture Koirala    Onset Date/Surgical Date  --   6 months   Next MD Visit  as needed     Prior Therapy  Here at this clinic but I can't remember what the issue was       Precautions   Precautions  None      Restrictions   Weight Bearing Restrictions  No      Balance Screen   Has the patient fallen in the past 6 months  No    Has the patient had a decrease in activity level because of a fear of falling?   No    Is the patient reluctant to leave their  home because of a fear of falling?   No      Home Film/video editor residence    Home Access  Stairs to enter    Home Layout  Two level      Prior Function   Level of Independence  Independent    Vocation  Full time employment    Vocation Requirements  grocery store check out     Leisure  shopping      Observation/Other Assessments   Focus on Therapeutic Outcomes (FOTO)   48% limitation       AROM   Lumbar Flexion  55    Lumbar Extension  20    Lumbar - Right Side Bend  32    Lumbar - Left Side Bend  28      Strength   Right Hip Flexion  4/5    Right Hip Extension  4/5    Right Hip ABduction  4-/5    Left Hip Flexion  4/5    Left Hip Extension  4/5    Left Hip ABduction  4-/5    Lumbar Flexion  4/5    Lumbar Extension  4/5      Palpation   Palpation comment  medial border of piriformis attachment       FABER test   findings  Positive    Side  Right      Slump test   Findings  Positive    Side  Right      Prone Knee Bend Test   Findings  Negative    Side  Right                 Objective measurements completed on examination: See above findings.              PT Education - 09/21/19 1945    Education Details  Access Code: [email or web address] trial of prone press ups and standing extensions; sitting posture correction ; dry needling info sheet   Person(s) Educated  Patient    Methods  Explanation;Demonstration;Handout    Comprehension  Returned demonstration;Verbalized understanding       PT Short Term Goals - 09/21/19 1959      PT SHORT TERM GOAL #1   Title  The patient will demonstrate knowledge of basic self care strategies, posture correction, change of position and exercises    Time  4    Period  Weeks    Status  New    Target Date  10/19/19      PT SHORT TERM GOAL #2   Title  The patient will report a 40% improvement in lumbar/hip pain with usual ADLs    Time  4    Period  Weeks    Status  New      PT SHORT TERM GOAL #3   Title  The patient will have improved lumbar flexion to 60 degrees and extension to 25 degrees as needed for home and work ADLs    Time  4    Period  Weeks    Status  New      PT SHORT TERM GOAL #4   Title  FOTO functional outcome score improved from 48% to 39%    Time  4    Period  Weeks    Status  New        PT Long Term Goals - 09/21/19 2002      PT LONG TERM GOAL #1  Title  The patient will be independent in safe self progression of HEP    Time  8    Period  Weeks    Status  New    Target Date  11/16/19      PT LONG TERM GOAL #2   Title  The patient will report a 65% reduction in hip/back pain with usual ADLS at home and work    Time  8    Period  Weeks    Status  New      PT LONG TERM GOAL #3   Title  The patient will be able to sit 20-25  minutes with pain level 4/10 or less    Time  8    Period  Weeks    Status  New      PT LONG TERM GOAL #4   Title  Lumbo/pelvic/hip core strength improved to grossly 4+/5 needed for 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  She denies any particular aggravating factors except right sidelying.  She reports her sitting tolerance is limited to 5-10 minutes.  She is able to stand for long periods of time at work [employer] check out) but some days she puts on a mentholatum patch.  Generally no difficulty walking.  Her lumbar ROM has movement loss of flexion > extension.  Repeated movement testing is inconclusive for a directional preference.  Symptoms not centralized with extension in prone however decreased pain intensity with repetition.  Tender point medial piriformis attachment.  Back pain with seated knee extension and FABER position.  Lumbar and hip strength grossly 4/5 except hip abduction 4-/5.  She would benefit from PT to address these deficits.    Personal Factors and Comorbidities  Comorbidity 1    Comorbidities  HTN; anxiety    Examination-Activity Limitations  Sit;Stand;Lift    Examination-Participation Restrictions  Community Activity;Other    Stability/Clinical Decision Making  Stable/Uncomplicated    Clinical Decision Making  Low    Rehab Potential  Good    PT Frequency  2x / week    PT Duration  8 weeks    PT Treatment/Interventions  ADLs/Self Care Home Management;Cryotherapy;Electrical Stimulation;Ultrasound;Traction;Moist Heat;Iontophoresis 4mg /ml Dexamethasone;Therapeutic activities;Therapeutic exercise;Neuromuscular re-education;Manual techniques;Patient/family education;Dry needling;Taping    PT Next Visit Plan  assess response to trial of prone press ups;  patient considering DN to piriformis medial border; manual therapy including soft tissue  mob, hip mobs, lumbar mobs;  ES/heat as needed;  core and hip strengthening    Recommended Other Services  Access Code:    Consulted and Agree with Plan of Care  Patient       Patient will benefit from skilled therapeutic intervention in order to improve the following deficits and impairments:  Increased fascial restricitons, Decreased range of motion, Pain, Decreased strength  Visit Diagnosis: Acute right-sided low back pain without sciatica - Plan: PT plan of care cert/re-cert  Pain in right hip - Plan: PT plan of care cert/re-cert  Muscle weakness (generalized) - Plan: PT plan of care cert/re-cert     Problem List Patient Active Problem List   Diagnosis Date Noted  . Intermittent palpitations 11/21/2017  . Obesity with body mass index 30 or greater 11/21/2017  . Small bowel obstruction due to adhesions (HCC) 10/28/2016  . Small bowel obstruction s/p ex lap & lysis of adhesions 10/28/2016 10/23/2016  . Abdominal mass 10/23/2016  . Hypokalemia 10/23/2016  . Murmur 09/26/2015  . Chest pain 09/26/2015  . HTN (hypertension) 09/26/2015  . Hypothyroidism 09/26/2015   Lavinia Sharps, PT 09/21/19 8:08 PM Phone: [phone] Fax: [phone] Vivien Presto 09/21/2019, 8:07 PM  [facility] [address] [place], [place], [address] Phone: [phone]   Fax:  [phone]  Name: Debbie Brandt MRN: [id number] Date of Birth: 16-Oct-1960

## 2019-09-23 ENCOUNTER — Encounter: Payer: Self-pay | Admitting: Physical Therapy

## 2019-09-23 ENCOUNTER — Ambulatory Visit: Payer: Managed Care, Other (non HMO) | Admitting: Physical Therapy

## 2019-09-23 ENCOUNTER — Other Ambulatory Visit: Payer: Self-pay

## 2019-09-23 DIAGNOSIS — M545 Low back pain, unspecified: Secondary | ICD-10-CM

## 2019-09-23 DIAGNOSIS — M25551 Pain in right hip: Secondary | ICD-10-CM

## 2019-09-23 DIAGNOSIS — M6281 Muscle weakness (generalized): Secondary | ICD-10-CM

## 2019-09-23 NOTE — Therapy (Signed)
[facility] [address], [address] [place], [place], [address] Phone: [phone]   Fax:  [phone]  Physical Therapy Treatment  Patient Details  Name: Debbie Brandt MRN: [id number] Date of Birth: 28-Jun-1961 Referring Provider (PT): Dr. Darrow Bussing   Encounter Date: 09/23/2019  PT End of Session - 09/23/19 1533    Visit Number  2    Date for PT Re-Evaluation  11/16/19    Authorization Type  [employer]    PT Start Time  1530    PT Stop Time  1612    PT Time Calculation (min)  42 min    Activity Tolerance  Patient tolerated treatment well    Behavior During Therapy  [facility] for tasks assessed/performed       Past Medical History:  Diagnosis Date  . Chest pain 09/26/2015  . Essential hypertension 09/26/2015  . GERD (gastroesophageal reflux disease)   . Hypertension   . Hypothyroidism 09/26/2015  . Murmur 09/26/2015  . Thyroid disease     Past Surgical History:  Procedure Laterality Date  . ABDOMINAL HYSTERECTOMY    . CESAREAN SECTION    . LAPAROSCOPIC ABDOMINAL EXPLORATION N/A 11/21/2017   Procedure: LAPAROSCOPIC ABDOMINAL EXPLORATION LYSIS OF ADHESIONS;  Surgeon: Almond Lint, MD;  Location: [facility];  Service: General;  Laterality: N/A;  . LAPAROTOMY N/A 10/28/2016   Procedure: EXPLORATORY LAPAROTOMY WITH  LYSIS OF ADHESIONS;  Surgeon: Avel Peace, MD;  Location: [facility];  Service: General;  Laterality: N/A;    There were no vitals filed for this visit.  Subjective Assessment - 09/23/19 1531    Subjective  Pain unchanged since eval.  Extension exercises seem to help somewhat.  I don't want to try the dry needling just yet.    Pertinent History  no previous history of LBP    Limitations  House hold activities;Walking    How long can you sit comfortably?  5-10 minutes    How long can you stand comfortably?  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  as long as I want    Diagnostic tests  none    Patient Stated Goals  stop hurting    Currently in Pain?  Yes    Pain Score  2     Pain Location  Back    Pain Orientation  Right    Pain Descriptors / Indicators  Sharp    Pain Type  Acute pain    Pain Onset  More than a month ago    Pain Frequency  Intermittent    Aggravating Factors   sitting>standing, lifting    Pain Relieving Factors  heat, patch                       [facility] Adult PT Treatment/Exercise - 09/23/19 0001      Exercises   Exercises  Knee/Hip;Lumbar      Lumbar Exercises: Stretches   Single Knee to Chest Stretch  Right;1 rep;30 seconds    Lower Trunk Rotation  3 reps;10 seconds    Piriformis Stretch  Right;1 rep;30 seconds    Figure 4 Stretch  1 rep;30 seconds;With overpressure   Rt, push knee away     Lumbar Exercises: Sidelying   Other Sidelying Lumbar Exercises  TrA and PF co-contraction 10x5 sec holds      Lumbar Exercises: Quadruped   Madcat/Old Horse  10 reps  Other Quadruped Lumbar Exercises  prayer, prayer with SB 2x10 sec each      Modalities   Modalities  Moist Heat      Moist Heat Therapy   Number Minutes Moist Heat  10 Minutes    Moist Heat Location  Lumbar Spine   supine     Manual Therapy   Manual Therapy  Manual Traction;Passive ROM    Passive ROM  Rt hip ER, flexion    Manual Traction  Rt LE Gr II/III             PT Education - 09/23/19 1613    Education Details  Access Code: [email or web address]    Person(s) Educated  Patient    Methods  Explanation;Demonstration;Handout;Verbal cues    Comprehension  Verbalized understanding;Returned demonstration       PT Short Term Goals - 09/23/19 1725      PT SHORT TERM GOAL #1   Title  The patient will demonstrate knowledge of basic self care strategies, posture correction, change of position and exercises    Status  On-going        PT Long Term Goals - 09/21/19 2002      PT LONG TERM GOAL #1   Title  The patient will be independent in safe  self progression of HEP    Time  8    Period  Weeks    Status  New    Target Date  11/16/19      PT LONG TERM GOAL #2   Title  The patient will report a 65% reduction in hip/back pain with usual ADLS at home and work    Time  8    Period  Weeks    Status  New      PT LONG TERM GOAL #3   Title  The patient will be able to sit 20-25  minutes with pain level 4/10 or less    Time  8    Period  Weeks    Status  New      PT LONG TERM GOAL #4   Title  Lumbo/pelvic/hip core strength improved to grossly 4+/5 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  She was not interested in DN as she is not comfortable with it but may consider it at a later time.  She was unsure of whether extension exercises had helped but they hadn't set her back either.  PT trialed heat, Rt hip distraction and ROM followed by gentle progression of hip and lumbar ROM/stretches with careful assessment to movement based ther ex.  Pt reported improved pain with addition of stretching and flexion based stretches in addition to extension already in place. She had improved awareness of TrA with PF contraction first, getting good co-contraction carry over . She will benefit from ongoing PT along POC with progression of core stabilization, postural re-ed, and ROM/stretching for hips and lumbar region.    Rehab Potential  Good  PT Frequency  2x / week    PT Duration  8 weeks    PT Treatment/Interventions  ADLs/Self Care Home Management;Cryotherapy;Electrical Stimulation;Ultrasound;Traction;Moist Heat;Iontophoresis 4mg /ml Dexamethasone;Therapeutic activities;Therapeutic exercise;Neuromuscular re-education;Manual  techniques;Patient/family education;Dry needling;Taping    PT Next Visit Plan  f/u on response to last visit and updated HEP, continue Rt hip and lumbar manual as needed, heat, review HEP stretches/ROM and progress core as able    PT Home Exercise Plan     Recommended Other Services  cert is signed    Consulted and Agree with Plan of Care  Patient       Patient will benefit from skilled therapeutic intervention in order to improve the following deficits and impairments:     Visit Diagnosis: Acute right-sided low back pain without sciatica  Pain in right hip  Muscle weakness (generalized)     Problem List Patient Active Problem List   Diagnosis Date Noted  . Intermittent palpitations 11/21/2017  . Obesity with body mass index 30 or greater 11/21/2017  . Small bowel obstruction due to adhesions ([facility]) 10/28/2016  . Small bowel obstruction s/p ex lap & lysis of adhesions 10/28/2016 10/23/2016  . Abdominal mass 10/23/2016  . Hypokalemia 10/23/2016  . Murmur 09/26/2015  . Chest pain 09/26/2015  . HTN (hypertension) 09/26/2015  . Hypothyroidism 09/26/2015    11/24/2015, PT 09/23/19 5:27 PM   [facility] [address] [place], [place], [place] Phone: [phone]   Fax:  [phone]  Name: Debbie Brandt MRN: [name] Date of Birth: 01/23/1961

## 2019-09-23 NOTE — Patient Instructions (Signed)
Access Code: [email or web address]  URL: [email or web address]  Date: 09/23/2019  Prepared by: Loistine Simas Trinda Harlacher   Exercises  Prone Press Up - 10 reps - 1 sets - 4-5x daily - 7x weekly  Standing Lumbar Extension - 10 reps - 1 sets - 4-5x daily - 7x weekly  Hooklying Single Knee to Chest - 3 reps - 1 sets - 30 hold - 1x daily - 7x weekly  Supine Figure 4 Piriformis Stretch - 3 reps - 1 sets - 30 hold - 1x daily - 7x weekly  Supine Piriformis Stretch with Foot on Ground - 3 reps - 1 sets - 30 hold - 1x daily - 7x weekly  Supine Lower Trunk Rotation - 10 reps - 3 sets - 1x daily - 7x weekly  Child's Pose Stretch - 3 reps - 3 sets - 10 hold - 1x daily - 7x weekly  Child's Pose with Sidebending - 3 reps - 3 sets - 10 hold - 1x daily - 7x weekly  Cat Cow - 10 reps - 2 sets - 5 hold - 1x daily - 7x weekly  Sidelying Transversus Abdominis Bracing - 5 reps - 2 sets - 10 hold - 1x daily - 7x weekly

## 2019-09-30 ENCOUNTER — Ambulatory Visit: Payer: Managed Care, Other (non HMO) | Admitting: Physical Therapy

## 2019-09-30 ENCOUNTER — Telehealth: Payer: Self-pay | Admitting: Physical Therapy

## 2019-09-30 NOTE — Telephone Encounter (Signed)
Left message to call (regarding no-show) for appt this morning.

## 2019-10-05 ENCOUNTER — Telehealth: Payer: Self-pay | Admitting: Physical Therapy

## 2019-10-05 ENCOUNTER — Ambulatory Visit: Payer: Managed Care, Other (non HMO) | Admitting: Physical Therapy

## 2019-10-05 NOTE — Telephone Encounter (Signed)
Left message for patient to call (regarding 2nd no-show for appt)

## 2019-10-08 ENCOUNTER — Other Ambulatory Visit: Payer: Self-pay

## 2019-10-08 ENCOUNTER — Encounter: Payer: Self-pay | Admitting: Physical Therapy

## 2019-10-08 ENCOUNTER — Ambulatory Visit: Payer: Managed Care, Other (non HMO) | Admitting: Physical Therapy

## 2019-10-08 DIAGNOSIS — M545 Low back pain, unspecified: Secondary | ICD-10-CM

## 2019-10-08 DIAGNOSIS — M6281 Muscle weakness (generalized): Secondary | ICD-10-CM

## 2019-10-08 DIAGNOSIS — M25551 Pain in right hip: Secondary | ICD-10-CM

## 2019-10-08 NOTE — Therapy (Signed)
[facility] [address] [place], [place], [address] Phone: [phone]   Fax:  [phone]  Physical Therapy Treatment/Discharge Summary   Patient Details  Name: Debbie Brandt MRN: [id number] Date of Birth: 1961/09/04 Referring Provider (PT): Dr. Lujean Amel   Encounter Date: 10/08/2019  PT End of Session - 10/08/19 0836    Visit Number  3    Date for PT Re-Evaluation  11/16/19    Authorization Type  [employer]    PT Start Time  0800    PT Stop Time  0830   discharge visit   PT Time Calculation (min)  30 min    Activity Tolerance  Patient tolerated treatment well       Past Medical History:  Diagnosis Date  . Chest pain 09/26/2015  . Essential hypertension 09/26/2015  . GERD (gastroesophageal reflux disease)   . Hypertension   . Hypothyroidism 09/26/2015  . Murmur 09/26/2015  . Thyroid disease     Past Surgical History:  Procedure Laterality Date  . ABDOMINAL HYSTERECTOMY    . CESAREAN SECTION    . LAPAROSCOPIC ABDOMINAL EXPLORATION N/A 11/21/2017   Procedure: LAPAROSCOPIC ABDOMINAL EXPLORATION LYSIS OF ADHESIONS;  Surgeon: Stark Klein, MD;  Location: [facility];  Service: General;  Laterality: N/A;  . LAPAROTOMY N/A 10/28/2016   Procedure: EXPLORATORY LAPAROTOMY WITH  LYSIS OF ADHESIONS;  Surgeon: Jackolyn Confer, MD;  Location: [facility];  Service: General;  Laterality: N/A;    There were no vitals filed for this visit.  Subjective Assessment - 10/08/19 0804    Subjective  I haven't had pain in a while.  It's been over a week.  Doing all home and work activities.    Currently in Pain?  No/denies    Pain Score  0-No pain    Pain Location  Back         [facility] PT Assessment - 10/08/19 0001      Observation/Other Assessments   Focus on Therapeutic Outcomes (FOTO)   6% limitation       AROM   Lumbar Flexion  55    Lumbar Extension  22    Lumbar - Right Side Bend  40    Lumbar - Left Side Bend  40       Strength   Right Hip Flexion  5/5    Right Hip Extension  5/5    Right Hip ABduction  5/5    Left Hip Flexion  5/5    Left Hip Extension  5/5    Left Hip ABduction  4+/5    Lumbar Flexion  4+/5    Lumbar Extension  4+/5                   [facility] Adult PT Treatment/Exercise - 10/08/19 0001      Therapeutic Activites    Lifting  lifting from the floor and transferring item onto another surface; work simulation       Lumbar Exercises: Seated   Other Seated Lumbar Exercises  discussion of a walking program and resisted strength training to maintain health       Lumbar Exercises: Supine   Ab Set  5 reps               PT Short Term Goals - 10/08/19 0300      PT SHORT TERM GOAL #1   Title  The patient will demonstrate knowledge of basic self care strategies, posture correction, change of position  and exercises    Status  Achieved      PT SHORT TERM GOAL #2   Title  The patient will report a 40% improvement in lumbar/hip pain with usual ADLs    Status  Achieved      PT SHORT TERM GOAL #3   Title  The patient will have improved lumbar flexion to 60 degrees and extension to 25 degrees as needed for home and work ADLs    Status  Partially Met      PT SHORT TERM GOAL #4   Title  FOTO functional outcome score improved from 48% to 39%    Status  Achieved        PT Long Term Goals - 10/08/19 0952      PT LONG TERM GOAL #1   Title  The patient will be independent in safe self progression of HEP    Status  Achieved      PT LONG TERM GOAL #2   Title  The patient will report a 65% reduction in hip/back pain with usual ADLS at home and work    Status  Achieved      PT LONG TERM GOAL #3   Title  The patient will be able to sit 20-25  minutes with pain level 4/10 or less    Status  Achieved      PT LONG TERM GOAL #4   Title  Lumbo/pelvic/hip core strength improved to grossly 4+/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  She is performing all her usual home and work ADLs.  She is able to sit as long as she wants now and although she has not tried, she feels she could walk a mile easily.  We discussed restarting a walking program which she has not done since her husband and son died.  She is able to demonstrate good lifting technique as needed for her job.  She has met all rehab goals.  Her Oswestry has decreased from a 48% limitation to only 6%.  Discharge from PT.       Patient will benefit from skilled therapeutic intervention in order to improve the following deficits and impairments:     Visit Diagnosis: Acute right-sided low back pain without sciatica  Pain in right hip  Muscle weakness (generalized)  PHYSICAL THERAPY DISCHARGE SUMMARY  Visits from Start of Care: 3  Current functional level related to goals / functional outcomes: See clinical impressions above   Remaining deficits: As above   Education / Equipment: HEP  Plan: Patient agrees to discharge.  Patient goals were met. Patient is being discharged due to meeting the stated rehab goals.  ?????       Problem List Patient Active Problem List   Diagnosis Date Noted  . Intermittent palpitations 11/21/2017  . Obesity with body mass index 30 or greater 11/21/2017  . Small bowel obstruction due to adhesions ([place]) 10/28/2016  . Small bowel obstruction s/p ex lap & lysis of adhesions 10/28/2016 10/23/2016  . Abdominal mass 10/23/2016  .  Hypokalemia 10/23/2016  . Murmur 09/26/2015  . Chest pain 09/26/2015  . HTN (hypertension) 09/26/2015  . Hypothyroidism 09/26/2015      Ruben Im, PT 10/08/19 9:55 AM Phone: [phone] Fax:  [phone] Alvera Singh 10/08/2019, 9:54 AM  [facility] [address], [place], [place], [address] Phone: [phone]   Fax:  [phone]  Name: JACARIA COLBURN MRN: [id number] Date of Birth: 02/06/1961

## 2019-10-12 ENCOUNTER — Encounter: Payer: Managed Care, Other (non HMO) | Admitting: Physical Therapy

## 2019-12-03 ENCOUNTER — Other Ambulatory Visit: Payer: Self-pay | Admitting: Family Medicine

## 2019-12-03 DIAGNOSIS — R413 Other amnesia: Secondary | ICD-10-CM

## 2019-12-26 ENCOUNTER — Ambulatory Visit
Admission: RE | Admit: 2019-12-26 | Discharge: 2019-12-26 | Disposition: A | Discharge status: Home / Self Care | Payer: Managed Care, Other (non HMO) | Source: Ambulatory Visit | Attending: Family Medicine | Admitting: Family Medicine

## 2019-12-26 ENCOUNTER — Other Ambulatory Visit: Payer: Self-pay

## 2019-12-26 DIAGNOSIS — R413 Other amnesia: Secondary | ICD-10-CM

## 2020-01-17 ENCOUNTER — Encounter: Payer: Self-pay | Admitting: Neurology

## 2020-04-04 ENCOUNTER — Encounter: Payer: Self-pay | Admitting: Neurology

## 2020-04-04 ENCOUNTER — Ambulatory Visit (INDEPENDENT_AMBULATORY_CARE_PROVIDER_SITE_OTHER): Payer: Managed Care, Other (non HMO) | Admitting: Neurology

## 2020-04-04 VITALS — BP 128/72 | HR 78 | Ht 62.0 in | Wt 162.0 lb

## 2020-04-04 DIAGNOSIS — R0683 Snoring: Secondary | ICD-10-CM | POA: Diagnosis not present

## 2020-04-04 DIAGNOSIS — E663 Overweight: Secondary | ICD-10-CM

## 2020-04-04 DIAGNOSIS — R413 Other amnesia: Secondary | ICD-10-CM

## 2020-04-04 DIAGNOSIS — R4586 Emotional lability: Secondary | ICD-10-CM | POA: Diagnosis not present

## 2020-04-04 NOTE — Progress Notes (Signed)
Subjective:    Patient ID: Debbie Brandt is a 59 y.o. female.  HPI     Huston Foley, MD, PhD [facility] [address], [address] [address] [place], [place] [address]  Dear Dr. Docia Chuck,   I saw your patient, Debbie Brandt, upon your kind request, in my Neurologic clinic today for initial consultation of her memory loss.  The patient is accompanied by her brother today.  As you know, Debbie Brandt is a 59 year old right-handed woman with an underlying medical history of hypertension, hypothyroidism, reflux disease, hyperlipidemia, kidney stones, depression, and overweight state, who reports very little of her own history, she is minimally verbal and provides very little details, her history is primarily provided by her brother.  He has noticed short-term memory issues for the past 6 to 12 months including forgetfulness and a tendency to repeat herself.  He reports that they lost a sister about 2 months ago, older sister did have memory loss and she was in a nursing home as I understand.  The patient has no other siblings other than her brother.  She lost her son at the age of 14 about 4 years ago and her husband passed away a little over 4 years ago.  The patient works as a Physicist, medical as a Conservation officer, nature.  She denies having had any difficulty performing her job duties, when she has to do something that is difficult for her she asks for help and she states.  She drinks caffeine in the form of soda occasionally and decaf coffee, 1 cup/day and estimates that she drinks about 2 bottles of water per day.  She was told in the past by her husband that she snored, reports that she sleeps fairly well, does not have any night to night nocturia.  She drives and has not had any issues driving.  She denies any significant depression but her brother is wondering if she has residual depression.   I reviewed your office note from 12/24/2019.  You ordered a brain MRI.  She had a brain MRI without  contrast on 12/26/2019 reviewed the results:   IMPRESSION: Normal for age noncontrast MRI appearance of the brain. No acute intracranial abnormality. She also had blood work through your office which I was able to review: She had CBC with differential, CMP, vitamin B12, blood work from 11/26/2027 showed normal folic acid, CMP showed benign findings, CBC with differential was benign, B12 1076 and TSH mildly elevated at 6.27.  She was advised to increase her levothyroxine to 100 mcg at the time. Her brother brought her medications and she is taking 88 mcg of levothyroxine and he or she are not aware that she is supposed to take 100 mcg daily.  They are requested to call your office for clarification.  Her Past Medical History Is Significant For: Past Medical History:  Diagnosis Date  . Chest pain 09/26/2015  . Depression   . Essential hypertension 09/26/2015  . GERD (gastroesophageal reflux disease)   . Hypertension   . Hypothyroidism 09/26/2015  . Murmur 09/26/2015  . Thyroid disease     Her Past Surgical History Is Significant For: Past Surgical History:  Procedure Laterality Date  . ABDOMINAL HYSTERECTOMY    . CESAREAN SECTION    . LAPAROSCOPIC ABDOMINAL EXPLORATION N/A 11/21/2017   Procedure: LAPAROSCOPIC ABDOMINAL EXPLORATION LYSIS OF ADHESIONS;  Surgeon: Almond Lint, MD;  Location: [facility];  Service: General;  Laterality: N/A;  . LAPAROTOMY N/A 10/28/2016   Procedure: EXPLORATORY  LAPAROTOMY WITH  LYSIS OF ADHESIONS;  Surgeon: Avel Peaceodd Rosenbower, MD;  Location: [facility];  Service: General;  Laterality: N/A;    Her Family History Is Significant For: Family History  Problem Relation Age of Onset  . Cancer Mother        lung cancer  . Hypertension Mother   . Cancer Father   . Heart attack Father   . Mental illness Sister   . Hypertension Sister   . Hyperlipidemia Sister   . Diabetes Brother   . Hypertension Brother     Her Social History Is Significant For: Social History    Socioeconomic History  . Marital status: Widowed    Spouse name: Not on file  . Number of children: Not on file  . Years of education: Not on file  . Highest education level: Not on file  Occupational History  . Not on file  Tobacco Use  . Smoking status: Never Smoker  . Smokeless tobacco: Never Used  Vaping Use  . Vaping Use: Never used  Substance and Sexual Activity  . Alcohol use: No    Alcohol/week: 0.0 standard drinks  . Drug use: No  . Sexual activity: Not Currently    Comment: 1st intercourse- 15, partners3, widow  Other Topics Concern  . Not on file  Social History Narrative   Epworth Sleepiness Scale = 6 (as of 61/06/2016)   Social Determinants of Health   Financial Resource Strain:   . Difficulty of Paying Living Expenses:   Food Insecurity:   . Worried About Programme researcher, broadcasting/film/videounning Out of Food in the Last Year:   . Baristaan Out of Food in the Last Year:   Transportation Needs:   . Freight forwarderLack of Transportation (Medical):   Marland Kitchen. Lack of Transportation (Non-Medical):   Physical Activity:   . Days of Exercise per Week:   . Minutes of Exercise per Session:   Stress:   . Feeling of Stress :   Social Connections:   . Frequency of Communication with Friends and Family:   . Frequency of Social Gatherings with Friends and Family:   . Attends Religious Services:   . Active Member of Clubs or Organizations:   . Attends BankerClub or Organization Meetings:   Marland Kitchen. Marital Status:     Her Allergies Are:  No Known Allergies:   Her Current Medications Are:  Outpatient Encounter Medications as of 61/20/2021  Medication Sig  . amLODipine (NORVASC) 10 MG tablet Take 10 mg by mouth daily.  Marland Kitchen. levothyroxine (SYNTHROID, LEVOTHROID) 75 MCG tablet Take 75 mcg by mouth daily before breakfast.  . losartan-hydrochlorothiazide (HYZAAR) 50-12.5 MG tablet Take 1 tablet by mouth daily.  . MULTIPLE VITAMIN PO Take 1 tablet by mouth daily.  Marland Kitchen. omeprazole (PRILOSEC) 20 MG capsule Take 1 capsule (20 mg total) by mouth  daily.  . sertraline (ZOLOFT) 100 MG tablet Take 100 mg by mouth daily.  Floyde Parkins. TRAVATAN Z 0.004 % SOLN ophthalmic solution Place 1 drop into the right eye daily at 6 PM.    No facility-administered encounter medications on file as of 61/20/2021.  :   Review of Systems:  Out of a complete 14 point review of systems, all are reviewed and negative with the exception of these symptoms as listed below:  Review of Systems  Neurological:       Here for consult on memory loss.     Objective:  Neurological Exam  Physical Exam Physical Examination:   Vitals:   04/04/20 1021  BP: 128/72  Pulse: 78  SpO2: 98%    General Examination: The patient is a very pleasant 59 y.o. female in no acute distress. She appears well-developed and well-nourished and well groomed.   HEENT: Normocephalic, atraumatic, pupils are equal, round and reactive to light and accommodation.  Extraocular tracking is preserved, face is symmetric with normal facial animation, hearing is grossly intact, airway examination reveals moderate mouth dryness, moderate airway crowding secondary to redundant soft palate.  Tongue protrudes centrally and palate elevates symmetrically.    Chest: Clear to auscultation without wheezing, rhonchi or crackles noted.  Heart: S1+S2+0, regular and normal without murmurs, rubs or gallops noted.   Abdomen: Soft, non-tender and non-distended with normal bowel sounds appreciated on auscultation.  Extremities: There is no pitting edema in the distal lower extremities bilaterally.  Skin: Warm and dry without trophic changes noted.  Musculoskeletal: exam reveals no obvious joint deformities, tenderness or joint swelling or erythema.   Neurologically:  Mental status: The patient is awake, alert Pays attention but is minimally verbal and does not provide details as to her history.  Her affect appears to be blunted. On 04/04/2020: MMSE: 22/30, CDT: 1/4, AFT: 8/min.  Cranial nerves II - XII are as  described above under HEENT exam. In addition: shoulder shrug is normal with equal shoulder height noted. Motor exam: Normal bulk, strength and tone is noted. There is no drift, tremor or rebound. Romberg is negative. Reflexes are 2+ throughout. Fine motor skills and coordination: intact with normal finger taps, normal hand movements, normal rapid alternating patting, normal foot taps and normal foot agility.  Cerebellar testing: No dysmetria or intention tremor. There is no truncal or gait ataxia.  Sensory exam: intact to light touch.  Gait, station and balance: She stands easily. No veering to one side is noted. No leaning to one side is noted. Posture is age-appropriate and stance is narrow based. Gait shows normal stride length and normal pace. No problems turning are noted.   Assessment and Plan:   In summary, Debbie Brandt is a very pleasant 59 y.o.-year old female with an underlying medical history of hypertension, hypothyroidism, reflux disease, hyperlipidemia, kidney stones, depression, and overweight state, who presents for evaluation of her memory loss.  Her brother is concerned about short-term memory issues for the past 6 to 12 months.  Her sister may have had dementia.  The patient has some vascular risk factors but memory loss may be confounded by thyroid dysfunction and suboptimally treated depression.  Her brother does voice concern that she may have residual depression.  She does not currently drink any alcohol but reports that she has not had any alcohol since her brother threw all the alcohol away.  He denies that he thought she was drinking too much.  He was just frustrated at 1 point about her inability to remember things and felt that she did not need any alcohol around her so he threw it away.  She does not have a history of alcohol abuse.  She has been advised to talk to your office about her levothyroxine dose.  She was supposed to increase it to 100 mcg after her last blood test.   She also had a recent brain MRI through your office which did not show any significant atrophy or focal findings.  I suggested we proceed with additional testing from my end of things including a sleep study to rule out obstructive sleep apnea as well as more formal evaluation of her memory function through neuropsychology.  I explained this consult to the patient and her brother and also explained the sleep study testing and the diagnosis of obstructive sleep apnea.  She would be willing to pursue these tests.  We will follow-up afterwards and continue to monitor her memory and memory scores.  I did not suggest any new medications from my end of things at this point.  We also talked about the importance of healthy lifestyle including good hydration, good nutrition and getting enough rest at night.  I answered all their questions today and the patient and her brother were in agreement with the plan.  Thank you very much for allowing me to participate in the care of this nice patient. If I can be of any further assistance to you please do not hesitate to call me at [phone].  Sincerely,   Huston Foley, MD, PhD

## 2020-04-04 NOTE — Patient Instructions (Signed)
You have complaints of memory loss: memory loss or changes in cognitive function can have many reasons and does not always mean you have dementia. Conditions that can contribute to subjective or objective memory loss include: depression, stress, poor sleep from insomnia or sleep apnea, dehydration, fluctuation in blood sugar values, thyroid or electrolyte dysfunction and certain vitamin deficiencies. Dementia can be caused by stroke, brain atherosclerosis or brain vascular disease due to vascular risk factors (smoking, high blood pressure, high cholesterol, obesity and uncontrolled diabetes), certain degenerative brain disorders (including Parkinson's disease and Multiple sclerosis) and by Alzheimer's disease or other, more rare and sometimes hereditary causes. We will do some additional testing:  I would like to order a sleep study to evaluate you for obstructive sleep apnea.  Untreated obstructive sleep apnea can lead to memory problems long-term. You have had recent blood work through your primary care physician.  Please call their office to get clarification about your thyroid medicine dose, as far as I understand you are supposed to increase your levothyroxine from 88 mcg to 100 mcg.  You had a recent brain MRI.  We will monitor your memory scores and consider memory medication down the road.  If you have symptoms of depression and anxiety, please talk to your primary care physician about your current depression medication.  We will also request a formal cognitive test called neuropsychological evaluation which is done by a licensed neuropsychologist. We will make a referral in that regard.

## 2020-04-19 ENCOUNTER — Telehealth: Payer: Self-pay

## 2020-04-19 NOTE — Telephone Encounter (Signed)
LVM for pt to call me back to schedule sleep study  

## 2020-04-24 ENCOUNTER — Ambulatory Visit: Payer: Managed Care, Other (non HMO) | Admitting: Neurology

## 2020-05-02 ENCOUNTER — Encounter: Payer: Self-pay | Admitting: Psychology

## 2020-05-09 ENCOUNTER — Telehealth: Payer: Self-pay

## 2020-05-09 NOTE — Telephone Encounter (Signed)
LVM for pt to call me back to schedule sleep study  

## 2020-05-18 ENCOUNTER — Telehealth: Payer: Self-pay

## 2020-05-18 NOTE — Telephone Encounter (Signed)
We have attempted to call the patient three times to schedule sleep study.  Patient has been unavailable at the phone numbers we have on file and has not returned our calls.  If patient calls back we will schedule them for their sleep study.  

## 2020-06-29 ENCOUNTER — Ambulatory Visit: Payer: Managed Care, Other (non HMO) | Admitting: Psychology

## 2020-07-06 ENCOUNTER — Telehealth: Payer: Self-pay | Admitting: Neurology

## 2020-07-06 ENCOUNTER — Other Ambulatory Visit: Payer: Self-pay

## 2020-07-06 ENCOUNTER — Ambulatory Visit: Payer: Managed Care, Other (non HMO) | Admitting: Neurology

## 2020-07-06 NOTE — Telephone Encounter (Signed)
Aundra Millet, Sorry I cancelled Pt 11/15 she has the eval scheduled for 10/28 and MaryClare walked her over to the sleep study to make an appt. It appears the brother called back the the phone room just confirmed his appt and didn't read  Your note. Jillian overheard and checked for me so please review and reschedule if needed. I tried my best to esplain to her brother as well.Thank you

## 2020-07-06 NOTE — Telephone Encounter (Signed)
Debbie Brandt - The Pt came in for her appt. Her brother let me listen to the message and he thought you were confirming the appt not rescheduling. Can you call him to let him know what is needed for the next appt I just scheduled for 11/15 ant 11:30am. I gave him a card with your name and also advised that he needs to speak with you to determine what's needed in advance of 11/15. Thank you

## 2020-07-10 NOTE — Telephone Encounter (Signed)
Patient was taken to sleep lab and spoke with [facility]. Per Zenon Mayo her authorization for sleep study will expire before patient can be scheduled for sleep study. Zenon Mayo will re auth and then call patient's brother to schedule sleep study.

## 2020-07-13 ENCOUNTER — Other Ambulatory Visit: Payer: Self-pay

## 2020-07-13 ENCOUNTER — Encounter: Payer: Managed Care, Other (non HMO) | Attending: Psychology | Admitting: Psychology

## 2020-07-13 ENCOUNTER — Encounter: Payer: Self-pay | Admitting: Psychology

## 2020-07-13 DIAGNOSIS — F321 Major depressive disorder, single episode, moderate: Secondary | ICD-10-CM | POA: Diagnosis not present

## 2020-07-13 DIAGNOSIS — R413 Other amnesia: Secondary | ICD-10-CM

## 2020-07-13 NOTE — Progress Notes (Signed)
Neuropsychological Consultation   Patient:   Debbie Brandt   DOB:   [birth year]  MR Number:  [id number]  Location:  [facility] FOR PAIN AND [facility] MEDICINE [facility] PHYSICAL MEDICINE AND REHABILITATION [address] [place], [address] [id number] [facility] [place] [address] Dept: [phone]           Date of Service:   07/13/2020  Start Time:   3 PM End Time:   5 PM  Today's visit was an in person visit that was conducted in my outpatient clinic office with the patient and her brother present.  1 hour and 15 minutes were spent in formal face-to-face clinical interview and the other 45 minutes were spent with records review, conceptualizing case and setting up testing protocols and report writing.  Provider/Observer:  Arley Phenix, Psy.D.       Clinical Neuropsychologist       Billing Code/Service: 96116/96121  Chief Complaint:    Debbie Brandt is a 59 year old female that was referred for neuropsychological evaluation as part of the overall neurological work-up due to reports of progressive memory loss.  The patient was initially referred for neurological work-up by her primary care physician Dibas Koirala, MD.  While the patient initially minimized memory changes her brother reports that the family has begun seeing increasing memory difficulties and the patient later acknowledges memory changes that she has seen including forgetting dates of events and her brother reports that she is forgetting more issues and having difficulties maintaining and keeping up with financial matters etc.  These memory changes are reported to have developed over the past 6 to 12 months and have included forgetfulness and a tendency to repeat herself and not noticing that she has done so.  The patient is described as having difficulty with new learning but retaining old memories.  The patient is also had significant losses recently the death of her sister after progressive decline due to  what was diagnosed as Alzheimer's dementia at the age of 74 with deficits identified at 59 years old.  The patient also lost her son recently at the age of 30 after a long history of medical issues including kidney transplant and recently developing an infection, which sounded like sepsis and ultimately passing away.  Reason for Service:  Debbie Brandt is a 59 year old female that was referred for neuropsychological evaluation as part of the overall neurological work-up due to reports of progressive memory loss.  The patient was initially referred for neurological work-up by her primary care physician Dibas Koirala, MD.  While the patient initially minimized memory changes her brother reports that the family has begun seeing increasing memory difficulties and the patient later acknowledges memory changes that she has seen including forgetting dates of events and her brother reports that she is forgetting more issues and having difficulties maintaining and keeping up with financial matters etc.  These memory changes are reported to have developed over the past 6 to 12 months and have included forgetfulness and a tendency to repeat herself and not noticing that she has done so.  The patient is described as having difficulty with new learning but retaining old memories.  The patient is also had significant losses recently the death of her sister after progressive decline due to what was diagnosed as Alzheimer's dementia at the age of 77 with deficits identified at 59 years old.  The patient also lost her son recently at the age of 85 after a long history of medical issues  including kidney transplant and recently developing an infection, which sounded like sepsis and ultimately passing away.  The patient's husband passed away about 4 years ago as well.  The patient has a past medical history including hypertension, hypothyroidism, reflux disease, hyperlipidemia, kidney stones, depression and overweight state.   The patient had a recent MRI with the impressions of normal for age and appearance with no acute intracranial abnormality.  There were no indications of significant small vessel disease or microischemic changes beyond normal age-related changes.  The patient is scheduled for a formal sleep study as there has been some concern about the possibility of obstructive sleep apnea.  The patient and her brother deny any history of tumors or visual hallucinations or other psychiatric features.  While initially, the patient was very quiet without directly answering questions and deferring to her brother she did open up and start offering more information.  She initially denied any significant memory changes but later acknowledged that she has noticed some memory loss and that she tends to forget dates of events and scheduling events and has had some difficulty with financial matters including paying bills on time.  The family is setting up automatic bill pay to avoid any future problems.  The patient's brother reports that other family members began noticing some memory changes and alerted him to these changes.  He had lived in [place] for a long time and had not been around her as much or their sister during the sisters deterioration from Alzheimer's.  The patient's brother reports that he has started noticing memory changes as well.  He reports that he is also noted patient struggling with bereavement with the death of her son after long medical difficulties and the death of their sister.  The patient and her brother deny any changes in expressive or receptive language functioning and they both deny any indications of significant geographic disorientation.  There was 1 instance where the patient got turned around while going on a detour but no other instances of difficulties with navigation.  The patient has been working as a Conservation officer, nature at [employer] and apparently has been doing okay with this job and if she has  difficulty she is able to seek help and support from others.  The patient is not drinking alcohol or using other substances.  The patient describes her sleep patterns is quite good and that she will go to bed between 10 and 11 PM and wake up at 6-6 30 a.m.  She reports that she generally feels rested in the morning.  There were reports from the patient's husband that the patient would snore fairly loudly which raise concerns about possible sleep apnea and she is scheduled for a sleep study.  Behavioral Observation: Debbie Brandt  presents as a 59 y.o.-year-old Right African American Female who appeared her stated age. her dress was Appropriate and she was Well Groomed and her manners were Appropriate to the situation.  her participation was indicative of Appropriate and Redirectable behaviors.  There were not any physical disabilities noted.  she displayed an appropriate level of cooperation and motivation.     Interactions:    Active Appropriate and Redirectable  Attention:   abnormal and attention span appeared shorter than expected for age  Memory:   abnormal; remote memory intact, recent memory impaired  Visuo-spatial:  not examined  Speech (Volume):  low  Speech:   normal; normal  Thought Process:  Coherent and Relevant  Though Content:  WNL; not  suicidal and not homicidal  Orientation:   person, place, time/date and situation  Judgment:   Fair  Planning:   Fair  Affect:    Appropriate  Mood:    Dysphoric the patient did have one crying spell during the clinical interview when we were addressing the nature and history of her son passing away.  She was able to collect herself appropriately.  Insight:   Good  Intelligence:   normal  Marital Status/Living: [place] with 2 other [place].  She is the youngest sibling and she has an older brother and the middle sibling has recently passed away after a 5-year deterioration  with what was diagnosed as Alzheimer's dementia.  The patient currently lives alone.  She has no living children and her son recently died after a long history of medical illnesses and kidney transplant with what appears to be sepsis.  Current Employment: The patient has been working as a Geophysical data processorcashier for grocery store and continues to perform adequately at this job.  Substance Use:  No concerns of substance abuse are reported.    Education:   HS Graduate  Medical History:   Past Medical History:  Diagnosis Date  . Chest pain 09/26/2015  . Depression   . Essential hypertension 09/26/2015  . GERD (gastroesophageal reflux disease)   . Hypertension   . Hypothyroidism 09/26/2015  . Murmur 09/26/2015  . Thyroid disease        Abuse/Trauma History: While the patient denies any significant history of abuse or trauma she has struggled with the death of her son recently as well as the deterioration and death of her sister and the death of her husband 4 years ago.  Psychiatric History:  The patient has a history of depression and/or bereavement.  But all of these are recent developments.  Family Med/Psych History:  Family History  Problem Relation Age of Onset  . Cancer Mother        lung cancer  . Hypertension Mother   . Cancer Father   . Heart attack Father   . Mental illness Sister   . Hypertension Sister   . Hyperlipidemia Sister   . Diabetes Brother   . Hypertension Brother    Impression/DX:  Debbie Brandt is a 59 year old female that was referred for neuropsychological evaluation as part of the overall neurological work-up due to reports of progressive memory loss.  The patient was initially referred for neurological work-up by her primary care physician Dibas Koirala, MD.  While the patient initially minimized memory changes her brother reports that the family has begun seeing increasing memory difficulties and the patient later acknowledges memory changes that she has seen including  forgetting dates of events and her brother reports that she is forgetting more issues and having difficulties maintaining and keeping up with financial matters etc.  These memory changes are reported to have developed over the past 6 to 12 months and have included forgetfulness and a tendency to repeat herself and not noticing that she has done so.  The patient is described as having difficulty with new learning but retaining old memories.  The patient is also had significant losses recently the death of her sister after progressive decline due to what was diagnosed as Alzheimer's dementia at the age of 59 with deficits identified at [age] years old.  The patient also lost her son recently at the age of 59 after a long history of medical issues including  kidney transplant and recently developing an infection, which sounded like sepsis and ultimately passing away.  There are indications acknowledgment from both the patient as well as her brother that there have been some progressing and ongoing memory loss but denies any other significant cognitive changes and deny changes related to expressive or receptive language and no significant inability to continue to perform her job.    Disposition/Plan:  We have set the patient up for formal neuropsychological testing including the Wechsler Adult Intelligence Scale-IV as well as the Wechsler Memory Scale-IV.  Once these measures are completed a determination will be made as to other possible testing procedures that would be appropriate to aid in answering the diagnostic question and issues as far as recommendations.  Once this formal testing is completed a formal report will be produced and made available to her referring physician Dr. Frances Furbish and a feedback session will be set up for the patient and her brother to go over the results.  Diagnosis:    Memory loss  Current moderate episode of major depressive disorder without prior episode (HCC)         Electronically  Signed   _______________________ Arley Phenix, Psy.D.

## 2020-07-31 ENCOUNTER — Ambulatory Visit: Payer: Managed Care, Other (non HMO) | Admitting: Neurology

## 2020-08-07 ENCOUNTER — Ambulatory Visit (INDEPENDENT_AMBULATORY_CARE_PROVIDER_SITE_OTHER): Payer: Managed Care, Other (non HMO) | Admitting: Neurology

## 2020-08-07 DIAGNOSIS — G4733 Obstructive sleep apnea (adult) (pediatric): Secondary | ICD-10-CM

## 2020-08-07 DIAGNOSIS — R4586 Emotional lability: Secondary | ICD-10-CM

## 2020-08-07 DIAGNOSIS — R0683 Snoring: Secondary | ICD-10-CM

## 2020-08-07 DIAGNOSIS — E663 Overweight: Secondary | ICD-10-CM

## 2020-08-07 DIAGNOSIS — R413 Other amnesia: Secondary | ICD-10-CM

## 2020-08-14 ENCOUNTER — Telehealth: Payer: Self-pay

## 2020-08-14 NOTE — Telephone Encounter (Signed)
-----   Message from Huston Foley, MD sent at 08/14/2020  8:45 AM EST ----- Patient referred by Dr. Lacey Jensen for memory loss, seen by me on 04/04/20, HST on 08/07/20.    Please call and notify the patient that the recent home sleep test showed rather mild or borderline obstructive sleep apnea. We can consider treatment with a machine called autoPAP. Alternative treatment could be in the form of weight loss and avoiding the back sleep position. I would say, for now, she can work on weight loss and try to avoid sleeping on her back.  Please arrange for FU appt for memory after her neuropsych appt in Jan., maybe mid-Feb.   Huston Foley, MD, PhD [facility])

## 2020-08-14 NOTE — Progress Notes (Signed)
Patient referred by Dr. Lacey Jensen for memory loss, seen by me on 04/04/20, HST on 08/07/20.    Please call and notify the patient that the recent home sleep test showed rather mild or borderline obstructive sleep apnea. We can consider treatment with a machine called autoPAP. Alternative treatment could be in the form of weight loss and avoiding the back sleep position. I would say, for now, she can work on weight loss and try to avoid sleeping on her back.  Please arrange for FU appt for memory after her neuropsych appt in Jan., maybe mid-Feb.   Huston Foley, MD, PhD [facility])

## 2020-08-14 NOTE — Telephone Encounter (Signed)
I called pt to discuss. No answer, left a message asking her to call me back. 

## 2020-08-14 NOTE — Procedures (Signed)
   [facility]  HOME SLEEP TEST (Watch PAT)  STUDY DATE: 08/07/20  DOB: [birth year]  MRN: [id number]  ORDERING CLINICIAN: Huston Foley, MD, PhD   REFERRING CLINICIAN: Koirala, Dibas, MD   CLINICAL INFORMATION/HISTORY: 59 year old right-handed woman with an underlying medical history of hypertension, hypothyroidism, reflux disease, hyperlipidemia, kidney stones, depression, and overweight state, who reports problems with her memory. She has a history of snoring.   BMI: 30.0 kg/m  FINDINGS:   Total Record Time (hours, min): 8 H 52 min  Total Sleep Time (hours, min):  7 H 56 min    Calculated pAHI (per hour):  5.7      REM pAHI:    N/A     NREM pAHI: N/A   Oxygen Saturation (%) Mean:  96 Minimum oxygen saturation (%):         93   O2 Saturation Range (%): 99-93 O2Saturation (minutes) <=88%:  00 min   Pulse Mean (bpm):    57 Pulse Range (100-47)   IMPRESSION: Mild/borderline obstructive sleep apnea  RECOMMENDATION:  This HST shows borderline obstructive sleep apnea, with an AHI of 5.7/hour and O2 nadir of 93%. There was sensor loss and lack of data in the middle of the night and REM sleep was not detected, which may have resulted in underestimation of her sleep disordered breathing. Treatment with positive airway pressure is not warranted, based on the results of this test. Treatment options for mild OSA include weight loss and avoidance of the supine sleep position or a dental device, in appropriate candidates. Positive airway pressure treatment in the form of autoPAP can be considered, if desired by the patient. These different avenues will be discussed with the patient. The patient will be seen in follow up in sleep clinic, if necessary. Please note, that other causes of the patient's symptoms, including circadian rhythm disturbances, an underlying mood disorder, medication effect and/or an underlying medical problem cannot be ruled out based on this test. Clinical  correlation is recommended. The patient should be cautioned not to drive, work at heights, or operate dangerous or heavy equipment when tired or sleepy. Review and reiteration of good sleep hygiene measures should be pursued with any patient. The referring provider will be notified of the test results.   I certify that I have reviewed the raw data recording prior to the issuance of this report in accordance with the standards of the American Academy of Sleep Medicine (AASM).  INTERPRETING PHYSICIAN:    Huston Foley, MD, PhD  Board Certified in Neurology and Sleep Medicine  [facility] [address], [address] [place], [place] [address] [phone]

## 2020-08-16 NOTE — Telephone Encounter (Signed)
Pt's brother, [name], per [facility], returned my call. I discussed pt's sleep results and recommendations. Pt would prefer to try to lose weight and avoid supine sleep. Pt's brother also declined a follow up with Dr. Frances Furbish to discuss pt's memory at this time. He will call back after the neuro-psych appt. Pt's brother verbalized understanding of results and had no further questions or concerns.

## 2020-08-25 ENCOUNTER — Encounter: Payer: Managed Care, Other (non HMO) | Attending: Psychology | Admitting: Psychology

## 2020-08-25 ENCOUNTER — Other Ambulatory Visit: Payer: Self-pay

## 2020-08-25 ENCOUNTER — Encounter: Payer: Self-pay | Admitting: Psychology

## 2020-08-25 DIAGNOSIS — R413 Other amnesia: Secondary | ICD-10-CM | POA: Diagnosis not present

## 2020-08-25 DIAGNOSIS — F0280 Dementia in other diseases classified elsewhere without behavioral disturbance: Secondary | ICD-10-CM | POA: Diagnosis present

## 2020-08-25 NOTE — Progress Notes (Addendum)
:     Neuropsychology Note  Debbie Brandt completed 240 minutes of neuropsychological testing with this provider.The patient did not appear overtly distressed by the testing session, per behavioral observation or via self-report. Rest breaks were offered.   Clinical Decision Making: In considering the patient's current level of functioning, level of presumed impairment, nature of symptoms, emotional and behavioral responses during the interview, level of literacy, and observed level of motivation/effort, a battery of tests was selected and completed by patient.   Debbie Brandt will return for an interactive feedback session with Dr. Kieth Brightly on 10/12/20 at which time her test performances, clinical impressions and treatment recommendations will be reviewed in detail. The patient understands she can contact our office should she require our assistance before this time.  Full report to follow.

## 2020-08-28 NOTE — Telephone Encounter (Signed)
error 

## 2020-08-29 ENCOUNTER — Other Ambulatory Visit: Payer: Self-pay

## 2020-08-29 ENCOUNTER — Encounter (HOSPITAL_BASED_OUTPATIENT_CLINIC_OR_DEPARTMENT_OTHER): Payer: Managed Care, Other (non HMO) | Admitting: Psychology

## 2020-08-29 DIAGNOSIS — F0280 Dementia in other diseases classified elsewhere without behavioral disturbance: Secondary | ICD-10-CM

## 2020-08-29 DIAGNOSIS — R413 Other amnesia: Secondary | ICD-10-CM | POA: Diagnosis not present

## 2020-09-01 ENCOUNTER — Encounter: Payer: Self-pay | Admitting: Psychology

## 2020-09-01 NOTE — Progress Notes (Addendum)
NEUROPSYCHOLOGICAL EVALUATION   Name:    Debbie Brandt  Date of Birth:   02/24/1961 Date of Interview:  07/13/20 Date of Testing:  09/03/20   Date of Feedback:  10/13/19      Background Information:  Reason for Referral:  Debbie Brandt is a 59 y.o. female referred by Dr. Frances Furbish to assess her current level of cognitive functioning and assist in differential diagnosis. She was initially referred to Dr. Frances Furbish for neurological work-up by her primary care physician Dibas Docia Chuck, MD. The current evaluation consisted of a review of available medical records, an interview with the patient and brother, and the completion of a neuropsychological testing battery. Informed consent was obtained.  History of Presenting Problem:              Debbie Brandt is a 59 year old, right handed, widowed, African-American, female that was referred for neuropsychological evaluation as part of the overall neurological work-up due to reports of progressive memory loss.  While the patient initially minimized memory changes her brother reports that the family has begun seeing increasing memory difficulties and the patient later acknowledges memory changes that she has seen including forgetting dates of events and her brother reports that she is forgetting more issues and having difficulties maintaining and keeping up with financial matters etc.  These memory changes are reported to have developed over the past 6 to 12 months and have included forgetfulness and a tendency to repeat herself and not noticing that she has done so.  The patient is described as having difficulty with new learning but retaining old memories.  The patient is also had significant losses recently the death of her sister after progressive decline due to what was diagnosed as Alzheimer's dementia at the age of 59 with deficits identified at 59 years old.  The patient also lost her son recently at the age of 9 after a long history of medical issues  including kidney transplant and recently developing an infection, which sounded like sepsis and ultimately passing away.  The patient's husband passed away about 4 years ago as well.  The patient has a past medical history including hypertension, hypothyroidism, reflux disease, hyperlipidemia, kidney stones, depression and overweight state.  The patient had a recent MRI with the impressions of normal for age and appearance with no acute intracranial abnormality.  There were no indications of significant small vessel disease or microischemic changes beyond normal age-related changes.  The patient is scheduled for a formal sleep study as there has been some concern about the possibility of obstructive sleep apnea.  The patient and her brother deny any history of tumors or visual hallucinations or other psychiatric features.  While initially, the patient was very quiet without directly answering questions and deferring to her brother she did open up and start offering more information.  She initially denied any significant memory changes but later acknowledged that she has noticed some memory loss and that she tends to forget dates of events and scheduling events and has had some difficulty with financial matters including paying bills on time.  The family is setting up automatic bill pay to avoid any future problems.  The patient's brother reports that other family members began noticing some memory changes and alerted him to these changes.  He had lived in [place] for a long time and had not been around her as much or their sister during the sisters deterioration from Alzheimer's.  The patient's brother reports that he has  started noticing memory changes as well.  He reports that he is also noted patient struggling with bereavement with the death of her son after long medical difficulties and the death of their sister.  The patient and her brother deny any changes in expressive or receptive language functioning  and they both deny any indications of significant geographic disorientation.  There was 1 instance where the patient got turned around while going on a detour but no other instances of difficulties with navigation.  The patient has been working as a Scientist, water quality at [employer] and apparently has been doing okay with this job and if she has difficulty she is able to seek help and support from others.  The patient is not drinking alcohol or using other substances.  The patient describes her sleep patterns is quite good and that she will go to bed between 10 and 11 PM and wake up at 6-6 30 a.m.  She reports that she generally feels rested in the morning.  There were reports from the patient's husband that the patient would snore fairly loudly which raise concerns about possible sleep apnea and she is scheduled for a sleep study.  Medical History:  Past Medical History:  Diagnosis Date  . Chest pain 09/26/2015  . Depression   . Essential hypertension 09/26/2015  . GERD (gastroesophageal reflux disease)   . Hypertension   . Hypothyroidism 09/26/2015  . Murmur 09/26/2015  . Thyroid disease    Current medications:  Outpatient Encounter Medications as of 08/29/2020  Medication Sig  . amLODipine (NORVASC) 10 MG tablet Take 10 mg by mouth daily.  Marland Kitchen levothyroxine (SYNTHROID, LEVOTHROID) 75 MCG tablet Take 75 mcg by mouth daily before breakfast.  . losartan-hydrochlorothiazide (HYZAAR) 50-12.5 MG tablet Take 1 tablet by mouth daily.  . MULTIPLE VITAMIN PO Take 1 tablet by mouth daily.  Marland Kitchen omeprazole (PRILOSEC) 20 MG capsule Take 1 capsule (20 mg total) by mouth daily.  . sertraline (ZOLOFT) 100 MG tablet Take 100 mg by mouth daily.  Dorette Grate Z 0.004 % SOLN ophthalmic solution Place 1 drop into the right eye daily at 6 PM.    No facility-administered encounter medications on file as of 08/29/2020.   Current Examination:  In considering the patient's current level of functioning, level of presumed  impairment, nature of symptoms, emotional and behavioral responses during the interview, level of literacy, and observed level of motivation/effort, a battery of tests was selected and completed by patient.   Behavioral Observations:   Debbie Brandt  presents as a 59 y.o.-year-old, right handed, widowed, African American, female, with 12 years of education who appeared her stated age. Her dress was appropriate and she was Well Groomed and her manners were Appropriate to the situation. her participation was indicative of Appropriate and Redirectable behaviors.  There were not any physical disabilities noted. She displayed an appropriate level of cooperation and motivation.    The patient did not appear overtly distressed by the testing session, per behavioral observation or via self-report. Rest breaks were offered. She had trouble following instructions, shifting, and maintaining set; repetition and prompting minimally effective.  She laughed when confronted with difficult items or those she perceived as challenging.   Orientation: Stated year as "2020" and gave inaccurate day of month. Accurately named the current President but not his predecessor.   Tests Administered: . Wechsler Adult Intelligence Scale, 4th Edition (WAIS-IV) . Wechsler Memory Scale, 4th Edition (Adult Battery)   Test Results: Note: Standardized scores are  presented only for use by appropriately trained professionals and to allow for any future test-retest comparison. These scores should not be interpreted without consideration of all the information that is contained in the rest of the report. The most recent standardization samples from the test publisher or other sources were used whenever possible to derive standard scores; scores were corrected for age, gender, ethnicity and education when available.   Test Scores:  Composite Score Summary  Scale Sum of Scaled Scores Composite Score Percentile Rank 95% Conf. Interval  Qualitative Description  Verbal Comprehension 14 VCI 70 2 66-77 Borderline  Perceptual Reasoning 16 PRI 73 4 68-81 Borderline  Working Memory 8 WMI 66 1 61-75 Extremely Low  Processing Speed 6 PSI 62 1 57-74 Extremely Low  Full Scale 44 FSIQ 63 1 60-68 Extremely Low  General Ability 30 GAI 69 2 65-75 Extremely Low    Working Memory Process Score Summary  Process Score Raw Score Scaled Score Percentile Rank Base Rate SEM  Digit Span Forward $RemoveBefo'8 8 25 'scvZBQjkHyD$ -- 1.44  Digit Span Backward $RemoveBefor'5 6 9 'xLEMUwwbbuNb$ -- 1.27  Digit Span Sequencing 1 2 0.4 -- 1.37  Longest Digit Span Forward 5 -- -- 94.5 --  Longest Digit Span Backward 3 -- -- 97.0 --  Longest Digit Span Sequence 3 -- -- 98.0 --   Brief Cognitive Status Exam Classification  Age Years of Education Raw Score Classification Level Base Rate  59 years 8 months 12 9 Very Low 0.0    Index Score Summary  Index Sum of Scaled Scores Index Score Percentile Rank 95% Confidence Interval Qualitative Descriptor  Auditory Memory (AMI) 7 47 <0.1 43-56 Extremely Low  Visual Memory (VMI) 9 50 <0.1 46-58 Extremely Low  Visual Working Memory (VWMI) 9 67 1 62-77 Extremely Low  Immediate Memory (IMI) 5 41 <0.1 38-50 Extremely Low  Delayed Memory (DMI) 11 51 0.1 47-61 Extremely Low     Predicted Difference Method   Index Predicted WMS-IV Index Score Actual WMS-IV Index Score Difference Critical Value  Significant Difference Y/N Base Rate  Auditory Memory 84 47 37 9.00 Y <1%  Visual Memory 87 50 37 8.38 Y <1%  Visual Working Memory 84 67 17 10.86 Y 10%  Immediate Memory 83 41 42 10.12 Y <1%  Delayed Memory 85 51 34 9.95 Y <1%  Statistical significance (critical value) at the .01 level.   Description of Test Results:  Premorbid verbal intellectual abilities were estimated to have been within the below average to average range based on education and occupational history.   Debbie Brandt obtained a WAIS-IV Full-Scale IQ of 72, which places her at the extremely  low range  of intellectual functioning (1st percentile).  Her Verbal Comprehension and Perceptual Reasoning Index scores were in the borderline impaired range (VCI = 70; PRI = 73). Within the verbal domain, her vocabulary was below average whereas verbal abstract reasoning and general fund of knowledge were borderline impaired. On performance subtests, visuoconstructional ability Gar[employer]/textile techn[employer]) was below average while measures of nonverbal reasoning and visual-spatial analysis placed in the borderline impaired range for her age. Auditory attention and working memory were extremely low. Psychomotor processing speed was also extremely low and the weakest of all domains.   On measures of auditory memory, the patient's immediate recall of story details was in the extremely low range for her age (Knox I = <1st percentile).  She retained 0% of the information after a 25-minute delay, which was again extremely low (  LM II = <1st percentile). On a paired word association list learning and memory task, she recalled 1 word across 4 trials, showing impaired learning.  She recalled 2 words after a 20-25 minute delay, which was also extremely low (<1st percentile). Recognition was also impaired. The patient's recall of simple line figures immediately after presentation was in the extremely low range (WMS-IV Visual Reproduction I = <1st percentile), and she could not recall any of the figural stimuli after 25 minutes (VR II = <1st percentile). Performance on a recognition task was extremely low.   Executive functioning was relatively impaired overall. Verbal and Non-verbal abstract reasoning were borderline impaired. Performance on a clock drawing task was below expectation.    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  Moderate deficits in processing speed and select executive abilities (i.e., verbal and non-verbal abstract reasoning, sequencing, perseverative responding) were also observed. Additionally, there is reported evidence that her cognitive deficits are interfering with her ability to manage complex tasks, such as managing finances and some work related tasks; she is currently a Scientist, water quality at [employer].  As such, diagnostic criteria for a major neurocognitive disorder are met.  The patient's cognitive profile is somewhat variable and suggests possibility of diffuse areas of involvement including both cortical and subcortical areas. It is possible that her cardiovascular risk factors and chronic underlying medical conditions are playing a role and possibly exacerbate cognitive dysfunction, particularly reductions in processing speed. Given historical report of "loud snoring" by her deceased husband (per brothers report), the possibility of untreated obstructive sleep apnea or another related sleep disorder should be further explored due to the known impact on learning and memory.  Results from recent imaging do not show signs of marked degeneration or atrophy often found in cortical dementias. However, an underlying neurodegenerative disease process such as Alzheimer's disease cannot be ruled out and still remains a likely etiology, given amnestic profile on certain memory tests, reported family history (e.g., sister died with early onset AD) , and other clinical features (Impairment in complex ADLs and difficulties with occupational performance). Fortunately, there is no evidence of recent depression or anxiety. There is also no evidence for visual hallucinations or delusions. Her cognitive functioning should be monitored through repeat neuropsychological assessment.  Final diagnostic impression will be rendered by Dr. Rexene Alberts at the time of follow-up, integrating the present neuropsychological findings with the  results from neurological examination, neuroimaging, and laboratory studies.  Recommendations: Based on the findings of the present evaluation, the following recommendations are offered:  1. The patient may be an appropriate candidate for cholinesterase inhibitor therapy. She will follow up with Dr. Rexene Alberts about this. 2. It is recommended that she participate in a sleep study to assess for OSA.  3. The patient should continue to receive assistance with complex ADLs, including finances, cooking and transportation if needed.  4. The patient should limit or refrain from driving, as deficits noted on testing could affect one's ability to safely operate a motor vehicle.  At minimum, it is recommended that this person undergo a formal driving evaluation. Could contact [address] at: [phone].   5. Her brother or a trusted friend/caregiver should monitor her medications to make sure they are being taken correctly.  6. The patient should continue to participate in activities which provide mental stimulation, safe cardiovascular exercise, and social interaction. 7.  Neuropsychological re-assessment in one year is recommended in order to monitor cognitive status, track progression of symptoms and further assist with treatment planning.   Feedback to Patient: Debbie Brandt will return for a feedback appointment on 10/13/19 to review the results of her neuropsychological evaluation with Dr. Sima Matas.   Thank you for your referral of Debbie Brandt. Please feel free to contact me if you have any questions or concerns regarding this report.

## 2020-10-12 ENCOUNTER — Encounter: Payer: Managed Care, Other (non HMO) | Attending: Psychology | Admitting: Psychology

## 2020-10-12 ENCOUNTER — Other Ambulatory Visit: Payer: Self-pay

## 2020-10-12 DIAGNOSIS — R413 Other amnesia: Secondary | ICD-10-CM | POA: Insufficient documentation

## 2020-10-12 DIAGNOSIS — F0280 Dementia in other diseases classified elsewhere without behavioral disturbance: Secondary | ICD-10-CM | POA: Diagnosis not present

## 2020-10-22 ENCOUNTER — Encounter: Payer: Self-pay | Admitting: Psychology

## 2020-10-22 NOTE — Progress Notes (Signed)
10/12/2020 1 today I provided feedback to the patient regarding the results of the recent neuropsychological evaluation.  Formal diagnosis of major neurocognitive disorder with multiple potential etiologies are noted.  The pattern is generally consistent with a process such as Alzheimer's dementia but there are number of other variables which could be playing a role including potential sleep apnea or other sleep related disorder.  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 Moderate deficits in processing speed and select executive abilities (i.e., verbal and non-verbal abstract reasoning, sequencing, perseverative responding) were also observed. Additionally, there is reported evidence that her cognitive deficits are interfering with her ability to manage complex tasks, such as managing finances and some work related tasks; she is currently a Scientist, water quality at [employer].  As such, diagnostic criteria for a major neurocognitive disorder are met.  The patient's cognitive profile is somewhat variable and suggests possibility of diffuse areas of involvement including both cortical and subcortical areas. It is possible that her cardiovascular risk factors and chronic underlying medical conditions are playing a role and possibly exacerbate cognitive dysfunction, particularly reductions in processing speed. Given historical report of "loud snoring" by her deceased husband (per brothers report), the possibility of untreated obstructive sleep apnea or another related sleep disorder should be further explored due to the known impact on learning and memory.  Results from recent imaging do not show signs of marked degeneration or atrophy often  found in cortical dementias. However, an underlying neurodegenerative disease process such as Alzheimer's disease cannot be ruled out and still remains a likely etiology, given amnestic profile on certain memory tests, reported family history (e.g., sister died with early onset AD) , and other clinical features (Impairment in complex ADLs and difficulties with occupational performance). Fortunately, there is no evidence of recent depression or anxiety. There is also no evidence for visual hallucinations or delusions. Her cognitive functioning should be monitored through repeat neuropsychological assessment.  Final diagnostic impression will be rendered by Dr. Rexene Alberts at the time of follow-up, integrating the present neuropsychological findings with the results from neurological examination, neuroimaging, and laboratory studies.  Recommendations: Based on the findings of the present evaluation, the following recommendations are offered:  1. The patient may be an appropriate candidate for cholinesterase inhibitor therapy. She will follow up with Dr. Rexene Alberts about this. 2. It is recommended that she participate in a sleep study to assess for OSA.  3. The patient should continue to receive assistance with complex ADLs, including finances, cooking and transportation if needed.  4. The patient should limit or refrain from driving, as deficits noted on testing could affect one's ability to safely operate a motor vehicle.  At minimum, it is recommended that this person undergo a formal driving evaluation. Could contact [place] at: [phone].   5. Her brother or a trusted friend/caregiver should monitor her medications to make sure they are being taken correctly.  6. The patient should continue to participate in activities which provide mental stimulation, safe cardiovascular exercise, and social interaction. 7. Neuropsychological re-assessment in one year is recommended in order to  monitor cognitive status, track progression of symptoms and further assist with treatment planning.   Feedback to Patient: Debbie Brandt will return for a feedback appointment on 10/13/19 to review  the results of her neuropsychological evaluation with Dr. Sima Matas.   Thank you for your referral of Debbie Brandt. Please feel free to contact me if you have any questions or concerns regarding this report.

## 2020-11-13 ENCOUNTER — Telehealth: Payer: Self-pay | Admitting: Neurology

## 2020-11-13 NOTE — Telephone Encounter (Signed)
Pt.'s brother [name] is asking for a call from RN regarding sister's recent test results. He did not recall the name of the test, but please advise.

## 2020-11-14 NOTE — Telephone Encounter (Addendum)
I called pt's brother. No answer, left a message asking pt to call me back.

## 2020-11-15 NOTE — Telephone Encounter (Signed)
Pt's brother is asking for a call back from [place].RN

## 2020-11-15 NOTE — Telephone Encounter (Signed)
I called I called patient's brother back, and we discussed message.  Patient had neuropsych testing in December and January.  Patient's brother is asking for follow-up appointment to discuss results with Dr. Frances Furbish  It was mentioned in the neuropsych evaluation patient may benefit from medication therapy.  I advised the patient's  brother right now I did not have any availabilities for the month of March to offer but patient's brother is going to check in with the patient and see what day of the week would work for her in the month of March due to her work schedule.  Once I know the specific days that may work we will reassess and trying the patient worked in.

## 2020-11-29 NOTE — Telephone Encounter (Signed)
Pt's brother called needing to speak to the RN for an update of a follow up appt. for a eval Please advise.

## 2020-11-29 NOTE — Telephone Encounter (Addendum)
I called the pt's brother back and we discussed message. I advised Dr. Frances Furbish did not have any openings for March still, but I could schedule her 12/19/20 at 200 pm check in at 1 pm.  Pt has been tentatively scheduled for 12/19/20 brother will CB if there is a conflict in schedule.

## 2020-12-19 ENCOUNTER — Ambulatory Visit: Payer: Managed Care, Other (non HMO) | Admitting: Neurology

## 2020-12-19 ENCOUNTER — Encounter: Payer: Self-pay | Admitting: Neurology

## 2020-12-19 ENCOUNTER — Other Ambulatory Visit: Payer: Self-pay

## 2020-12-19 VITALS — BP 122/64 | HR 67 | Ht 62.0 in | Wt 165.3 lb

## 2020-12-19 DIAGNOSIS — R413 Other amnesia: Secondary | ICD-10-CM | POA: Diagnosis not present

## 2020-12-19 MED ORDER — DONEPEZIL HCL 10 MG PO TABS: 5.0000 mg | ORAL_TABLET | Freq: Every day | ORAL | 5 refills | Status: DC

## 2020-12-19 NOTE — Patient Instructions (Addendum)
It was good to see you both again today.   As discussed, we will start a new medication for your memory loss, with the hope to preserve your memory function longer and keep it stable. Aricept (generic name: donepezil) 10 mg: take half a pill each evening. Common side effects may include dry eyes, dry mouth, confusion, low pulse, low blood pressure, also GI related side effects (nausea, vomiting, diarrhea, constipation), headaches; rare side effects may include hallucinations and seizures.   We will arrange for a follow-up for you to see one of our nurse practitioners in about 3 months, please call us with any interim questions or concerns you may have.  If you are doing well and tolerates the donepezil at half a pill daily, we will consider increasing it to a full pill daily at the next visit.

## 2020-12-19 NOTE — Progress Notes (Signed)
Subjective:    Patient ID: Debbie Brandt is a 60 y.o. female.  HPI     Interim history:   Debbie Brandt is a 60 year old right-handed woman with an underlying medical history of hypertension, hypothyroidism, reflux disease, hyperlipidemia, kidney stones, depression, and overweight state, who presents for follow up consultation of her memory loss. The patient is accompanied by her brother again today. I first met her on 04/04/2020, at which time her brother provided most of the history and reported that she had short-term memory issues for the past 6 to 12 months, including repeating herself and forgetfulness. Her MMSE was 22 out of 30 at the time. She had undergone recent work-up through her primary care, including MRI brain, which was benign and blood work; I suggested further evaluation with a sleep study as well as neuropsychological evaluation.  She was agreeable.    She had a home sleep test on 08/07/2020 which showed very mild/borderline obstructive sleep apnea with an AHI of 5.7/h, O2 nadir 93%.  She had evaluation through neuropsychology with Dr. Ilean Skill on 08/29/2020 and I reviewed results and recommendations, he had a feedback appointment with the patient on 10/12/2020. 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 Moderate deficits in processing speed and select executive abilities (i.e., verbal and non-verbal abstract reasoning, sequencing, perseverative responding) were also observed. Additionally, there is reported evidence that her cognitive deficits are interfering with her ability to manage complex tasks, such as managing finances and some work related tasks; she is currently a Scientist, water quality at [employer].  As such, diagnostic criteria for a major neurocognitive disorder are met.   The  patient's cognitive profile is somewhat variable and suggests possibility of diffuse areas of involvement including both cortical and subcortical areas. It is possible that her cardiovascular risk factors and chronic underlying medical conditions are playing a role and possibly exacerbate cognitive dysfunction, particularly reductions in processing speed. Given historical report of "loud snoring" by her deceased husband (per brothers report), the possibility of untreated obstructive sleep apnea or another related sleep disorder should be further explored due to the known impact on learning and memory.  Results from recent imaging do not show signs of marked degeneration or atrophy often found in cortical dementias. However, an underlying neurodegenerative disease process such as Alzheimer's disease cannot be ruled out and still remains a likely etiology, given amnestic profile on certain memory tests, reported family history (e.g., sister died with early onset AD) , and other clinical features (Impairment in complex ADLs and difficulties with occupational performance). Fortunately, there is no evidence of recent depression or anxiety. There is also no evidence for visual hallucinations or delusions. Her cognitive functioning should be monitored through repeat neuropsychological assessment.  Final diagnostic impression will be rendered by Dr. Rexene Alberts at the time of follow-up, integrating the present neuropsychological findings with the results from neurological examination, neuroimaging, and laboratory studies.   Recommendations: Based on the findings of the present evaluation, the following recommendations are offered:   1. The patient may be an appropriate candidate for cholinesterase inhibitor therapy. She will follow up with Dr. Rexene Alberts about this. 2. It is recommended that she participate in a sleep study to assess for OSA.  3. The patient should continue to receive assistance with complex ADLs, including  finances, cooking and transportation if needed.  4. The patient should  limit or refrain from driving, as deficits noted on testing could affect one's ability to safely operate a motor vehicle.  At minimum, it is recommended that this person undergo a formal driving evaluation. Could contact [place] at: [phone].   5. Her brother or a trusted friend/caregiver should monitor her medications to make sure they are being taken correctly.  6. The patient should continue to participate in activities which provide mental stimulation, safe cardiovascular exercise, and social interaction. 7. Neuropsychological re-assessment in one year is recommended in order to monitor cognitive status, track progression of symptoms and further assist with treatment planning.  >>  Today, 12/19/2020: She reports feeling stable, no new issues, brother reports that she has been stable, he has had no new concerns.  He sees her about twice a week, he often brings food, she does not tend to cut, make sandwiches mostly, drinks fairly well with water and some diet soda at times, not daily.  She has not had any new medical issues but no longer takes amlodipine, he reports that she only takes 4 medications, brought her medication bottles and amlodipine was not on it, he is not sure how long she has been off of it, not sure when her next primary care appointment is.  The patient's allergies, current medications, family history, past medical history, past social history, past surgical history and problem list were reviewed and updated as appropriate.   Previously:   (She) reports very little of her own history, she is minimally verbal and provides very little details, her history is primarily provided by her brother.  He has noticed short-term memory issues for the past 6 to 12 months including forgetfulness and a tendency to repeat herself.  He reports that they lost a sister about 2 months ago, older sister did  have memory loss and she was in a nursing home as I understand.  The patient has no other siblings other than her brother.  She lost her son at the age of 69 about 4 years ago and her husband passed away a little over 4 years ago.  The patient works as a Production designer, theatre/television/film as a Scientist, water quality.  She denies having had any difficulty performing her job duties, when she has to do something that is difficult for her she asks for help and she states.  She drinks caffeine in the form of soda occasionally and decaf coffee, 1 cup/day and estimates that she drinks about 2 bottles of water per day.  She was told in the past by her husband that she snored, reports that she sleeps fairly well, does not have any night to night nocturia.  She drives and has not had any issues driving.  She denies any significant depression but her brother is wondering if she has residual depression.    I reviewed your office note from 12/24/2019.  You ordered a brain MRI.  She had a brain MRI without contrast on 12/26/2019 reviewed the results:   IMPRESSION: Normal for age noncontrast MRI appearance of the brain. No acute intracranial abnormality. She also had blood work through your office which I was able to review: She had CBC with differential, CMP, vitamin B12, blood work from 11/26/2019 showed normal folic acid, CMP showed benign findings, CBC with differential was benign, B12 1076 and TSH mildly elevated at 6.27.  She was advised to increase her levothyroxine to 100 mcg at the time. Her brother brought her medications and she is taking 88 mcg of  levothyroxine and he or she are not aware that she is supposed to take 100 mcg daily.  They are requested to call your office for clarification.  Her Past Medical History Is Significant For: Past Medical History:  Diagnosis Date  . Chest pain 09/26/2015  . Depression   . Essential hypertension 09/26/2015  . GERD (gastroesophageal reflux disease)   . Hypertension   . Hypothyroidism 09/26/2015  .  Murmur 09/26/2015  . Thyroid disease     Her Past Surgical History Is Significant For: Past Surgical History:  Procedure Laterality Date  . ABDOMINAL HYSTERECTOMY    . CESAREAN SECTION    . LAPAROSCOPIC ABDOMINAL EXPLORATION N/A 11/21/2017   Procedure: LAPAROSCOPIC ABDOMINAL EXPLORATION LYSIS OF ADHESIONS;  Surgeon: Almond Lint, MD;  Location: [facility];  Service: General;  Laterality: N/A;  . LAPAROTOMY N/A 10/28/2016   Procedure: EXPLORATORY LAPAROTOMY WITH  LYSIS OF ADHESIONS;  Surgeon: Avel Peace, MD;  Location: [facility];  Service: General;  Laterality: N/A;    Her Family History Is Significant For: Family History  Problem Relation Age of Onset  . Cancer Mother        lung cancer  . Hypertension Mother   . Cancer Father   . Heart attack Father   . Mental illness Sister   . Hypertension Sister   . Hyperlipidemia Sister   . Diabetes Brother   . Hypertension Brother     Her Social History Is Significant For: Social History   Socioeconomic History  . Marital status: Widowed    Spouse name: Not on file  . Number of children: Not on file  . Years of education: Not on file  . Highest education level: Not on file  Occupational History  . Not on file  Tobacco Use  . Smoking status: Never Smoker  . Smokeless tobacco: Never Used  Vaping Use  . Vaping Use: Never used  Substance and Sexual Activity  . Alcohol use: No    Alcohol/week: 0.0 standard drinks  . Drug use: No  . Sexual activity: Not Currently    Comment: 1st intercourse- 15, partners3, widow  Other Topics Concern  . Not on file  Social History Narrative   Epworth Sleepiness Scale = 6 (as of 09/26/2015)   Social Determinants of Health   Financial Resource Strain: Not on file  Food Insecurity: Not on file  Transportation Needs: Not on file  Physical Activity: Not on file  Stress: Not on file  Social Connections: Not on file    Her Allergies Are:  No Known Allergies:   Her Current Medications Are:   Outpatient Encounter Medications as of 12/19/2020  Medication Sig  . levothyroxine (SYNTHROID, LEVOTHROID) 75 MCG tablet Take 75 mcg by mouth daily before breakfast.  . losartan-hydrochlorothiazide (HYZAAR) 50-12.5 MG tablet Take 1 tablet by mouth daily.  Marland Kitchen omeprazole (PRILOSEC) 20 MG capsule Take 1 capsule (20 mg total) by mouth daily.  . sertraline (ZOLOFT) 100 MG tablet Take 100 mg by mouth daily.  . TRAVATAN Z 0.004 % SOLN ophthalmic solution Place 1 drop into the right eye daily at 6 PM.   . [DISCONTINUED] amLODipine (NORVASC) 10 MG tablet Take 10 mg by mouth daily. (Patient not taking: Reported on 12/19/2020)  . [DISCONTINUED] MULTIPLE VITAMIN PO Take 1 tablet by mouth daily.   No facility-administered encounter medications on file as of 12/19/2020.  :  Review of Systems:  Out of a complete 14 point review of systems, all are reviewed and negative with  the exception of these symptoms as listed below: Review of Systems  Neurological:       Here for f/u on Neuropsych testing. Pt and brother sts sx are the same no new issues to report.       Objective:  Neurological Exam  Physical Exam Physical Examination:   Vitals:   12/19/20 1343  BP: 122/64  Pulse: 67  SpO2: 98%    General Examination: The patient is a very pleasant 61 y.o. female in no acute distress. She appears well-developed and well-nourished and well groomed.   HEENT: Normocephalic, atraumatic, pupils are equal, round and reactive to light and accommodation.  Extraocular tracking is preserved, face is symmetric with normal facial animation, hearing is grossly intact, airway examination reveals moderate mouth dryness, moderate airway crowding secondary to redundant soft palate.  Tongue protrudes centrally and palate elevates symmetrically.    Chest: Clear to auscultation without wheezing, rhonchi or crackles noted.  Heart: S1+S2+0, regular and normal without murmurs, rubs or gallops noted.   Abdomen: Soft,  non-tender and non-distended.  Extremities: There is no pitting edema in the distal lower extremities bilaterally.  Skin: Warm and dry without trophic changes noted.  Musculoskeletal: exam reveals no obvious joint deformities, tenderness or joint swelling.  Neurologically:  Mental status: The patient is awake, alert and pays good attention.  She is more interactive today.  Details to her history are provided by her brother.  Affect is better today.   On 04/04/2020: MMSE: 22/30, CDT: 1/4, AFT: 8/min.  Cranial nerves II - XII are as described above under HEENT exam. In addition: shoulder shrug is normal with equal shoulder height noted. Motor exam: Normal bulk, strength and tone is noted. There is no drift, tremor or rebound. Romberg is negative. Reflexes are 1-2+ throughout. Fine motor skills and coordination: intact grossly.  Cerebellar testing: No dysmetria or intention tremor. There is no truncal or gait ataxia.  Sensory exam: intact to light touch.  Gait, station and balance: She stands easily. No veering to one side is noted. No leaning to one side is noted. Posture is age-appropriate and stance is narrow based. Gait shows normal stride length and normal pace. No problems turning are noted.   Assessment and Plan:   In summary, VIRGIN ZELLERS is a very pleasant 60 year old female with an underlying medical history of hypertension, hypothyroidism, reflux disease, hyperlipidemia, kidney stones, depression, and overweight state, who presents for follow-up consultation of her memory loss of approximately 1 to 1-1/2 years duration by her brother's report.  She has some vascular risk factors and a family history of dementia as well.  She had evidence of neurocognitive disorder during cognitive evaluation, brain MRI showed fairly age-appropriate findings overall.  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  We talked about her recent evaluation, she is agreeable to starting a memory medication, we talked about Aricept, its expectations, common side effects and limitations.  She is agreeable to starting 10 mg strength half a pill daily.  She is advised to let us know if she has any side effects.  In particular, we talked about dizziness, sleepiness, low heart rate.  She is in close contact with her brother.  She is advised to talk to her primary care physician about her blood pressure medicine and clarify if she is supposed to stay off the amlodipine.  Blood pressure and pulse look good currently.  She is advised  to continue to stay well-hydrated with water and well rested.  Interim sleep testing with a home sleep test in November 2021 indicated only borderline obstructive sleep apnea.  She is advised to follow-up routinely in this clinic to see one of our nurse practitioners for memory recheck in about 3 months, we will consider increasing her donepezil from half a pill to a whole pill at the time if she is able to tolerate it.  We talked about the importance of healthy lifestyle again today. I answered all their questions today and the patient and her brother were in agreement with the plan.  I spent 30 minutes in total face-to-face time and in reviewing records during pre-charting, more than 50% of which was spent in counseling and coordination of care, reviewing test results, reviewing medications and treatment regimen and/or in discussing or reviewing the diagnosis of memory loss, the prognosis and treatment options. Pertinent laboratory and imaging test results that were available during this visit with the patient were reviewed by me and considered in my medical decision making (see chart for details).

## 2021-02-17 ENCOUNTER — Emergency Department (HOSPITAL_COMMUNITY)
Admission: EM | Admit: 2021-02-17 | Discharge: 2021-02-17 | Disposition: A | Discharge status: Home / Self Care | Payer: Managed Care, Other (non HMO) | Attending: Emergency Medicine | Admitting: Emergency Medicine

## 2021-02-17 DIAGNOSIS — Z79899 Other long term (current) drug therapy: Secondary | ICD-10-CM | POA: Insufficient documentation

## 2021-02-17 DIAGNOSIS — I10 Essential (primary) hypertension: Secondary | ICD-10-CM | POA: Diagnosis not present

## 2021-02-17 DIAGNOSIS — E039 Hypothyroidism, unspecified: Secondary | ICD-10-CM | POA: Diagnosis not present

## 2021-02-17 DIAGNOSIS — R110 Nausea: Secondary | ICD-10-CM | POA: Diagnosis not present

## 2021-02-17 LAB — CBC WITH DIFFERENTIAL/PLATELET
Abs Immature Granulocytes: 0.01 10*3/uL (ref 0.00–0.07)
Basophils Absolute: 0 10*3/uL (ref 0.0–0.1)
Basophils Relative: 1 %
Eosinophils Absolute: 0 10*3/uL (ref 0.0–0.5)
Eosinophils Relative: 0 %
HCT: 39.2 % (ref 36.0–46.0)
Hemoglobin: 12.3 g/dL (ref 12.0–15.0)
Immature Granulocytes: 0 %
Lymphocytes Relative: 33 %
Lymphs Abs: 1.6 10*3/uL (ref 0.7–4.0)
MCH: 27 pg (ref 26.0–34.0)
MCHC: 31.4 g/dL (ref 30.0–36.0)
MCV: 86 fL (ref 80.0–100.0)
Monocytes Absolute: 0.3 10*3/uL (ref 0.1–1.0)
Monocytes Relative: 7 %
Neutro Abs: 2.8 10*3/uL (ref 1.7–7.7)
Neutrophils Relative %: 59 %
Platelets: 403 10*3/uL — ABNORMAL HIGH (ref 150–400)
RBC: 4.56 MIL/uL (ref 3.87–5.11)
RDW: 13.3 % (ref 11.5–15.5)
WBC: 4.8 10*3/uL (ref 4.0–10.5)
nRBC: 0 % (ref 0.0–0.2)

## 2021-02-17 LAB — COMPREHENSIVE METABOLIC PANEL
ALT: 12 U/L (ref 0–44)
AST: 16 U/L (ref 15–41)
Albumin: 3.7 g/dL (ref 3.5–5.0)
Alkaline Phosphatase: 69 U/L (ref 38–126)
Anion gap: 6 (ref 5–15)
BUN: 9 mg/dL (ref 8–23)
CO2: 28 mmol/L (ref 22–32)
Calcium: 8.9 mg/dL (ref 8.9–10.3)
Chloride: 106 mmol/L (ref 98–111)
Creatinine, Ser: 0.79 mg/dL (ref 0.44–1.00)
GFR, Estimated: >60 mL/min (ref >60)
Glucose, Bld: 97 mg/dL (ref 70–99)
Potassium: 3.5 mmol/L (ref 3.5–5.1)
Sodium: 140 mmol/L (ref 135–145)
Total Bilirubin: 0.5 mg/dL (ref 0.3–1.2)
Total Protein: 7.3 g/dL (ref 6.5–8.1)

## 2021-02-17 LAB — LIPASE, BLOOD: Lipase: 24 U/L (ref 11–51)

## 2021-02-17 MED ORDER — ONDANSETRON 4 MG PO TBDP
4.0000 mg | ORAL_TABLET | Freq: Once | ORAL | Status: AC
  Administered 2021-02-17: 4 mg via ORAL
  Filled 2021-02-17: qty 1

## 2021-02-17 MED ORDER — ONDANSETRON 8 MG PO TBDP: 8.0000 mg | ORAL_TABLET | Freq: Once | ORAL | Status: DC

## 2021-02-17 MED ORDER — ONDANSETRON 8 MG PO TBDP: 8.0000 mg | ORAL_TABLET | Freq: Three times a day (TID) | ORAL | 0 refills | Status: DC | PRN

## 2021-02-17 NOTE — ED Triage Notes (Signed)
Patient reports she has been nauseated since Thursday. Unable to vomit. Denies pain.

## 2021-02-17 NOTE — ED Provider Notes (Signed)
[facility]-EMERGENCY DEPT Provider Note   CSN: [id number] Arrival date & time: 02/17/21  1223     History Chief Complaint  Patient presents with  . Nausea    Debbie Brandt is a 60 y.o. female.  60 year old female presents with nausea x2 days.  She also has chronic belching which she says is unchanged.  No associate abdominal discomfort with it.  Denies any emesis, fever, diarrhea.  No urinary symptoms.  No chest pain or shortness of breath.  Symptoms wax and wane and she is unsure how long they last for.  No treatment use prior to arrival        Past Medical History:  Diagnosis Date  . Chest pain 09/26/2015  . Depression   . Essential hypertension 09/26/2015  . GERD (gastroesophageal reflux disease)   . Hypertension   . Hypothyroidism 09/26/2015  . Murmur 09/26/2015  . Thyroid disease     Patient Active Problem List   Diagnosis Date Noted  . Intermittent palpitations 11/21/2017  . Obesity with body mass index 30 or greater 11/21/2017  . Small bowel obstruction due to adhesions (HCC) 10/28/2016  . Small bowel obstruction s/p ex lap & lysis of adhesions 10/28/2016 10/23/2016  . Abdominal mass 10/23/2016  . Hypokalemia 10/23/2016  . Murmur 09/26/2015  . Chest pain 09/26/2015  . HTN (hypertension) 09/26/2015  . Hypothyroidism 09/26/2015    Past Surgical History:  Procedure Laterality Date  . ABDOMINAL HYSTERECTOMY    . CESAREAN SECTION    . LAPAROSCOPIC ABDOMINAL EXPLORATION N/A 11/21/2017   Procedure: LAPAROSCOPIC ABDOMINAL EXPLORATION LYSIS OF ADHESIONS;  Surgeon: Almond Lint, MD;  Location: [facility];  Service: General;  Laterality: N/A;  . LAPAROTOMY N/A 10/28/2016   Procedure: EXPLORATORY LAPAROTOMY WITH  LYSIS OF ADHESIONS;  Surgeon: Avel Peace, MD;  Location: [facility];  Service: General;  Laterality: N/A;     OB History    Gravida  1   Para  1   Term      Preterm      AB      Living  0     SAB      IAB      Ectopic       Multiple      Live Births           Obstetric Comments  Passed away at 62         Family History  Problem Relation Age of Onset  . Cancer Mother        lung cancer  . Hypertension Mother   . Cancer Father   . Heart attack Father   . Mental illness Sister   . Hypertension Sister   . Hyperlipidemia Sister   . Diabetes Brother   . Hypertension Brother     Social History   Tobacco Use  . Smoking status: Never Smoker  . Smokeless tobacco: Never Used  Vaping Use  . Vaping Use: Never used  Substance Use Topics  . Alcohol use: No    Alcohol/week: 0.0 standard drinks  . Drug use: No    Home Medications Prior to Admission medications   Medication Sig Start Date End Date Taking? Authorizing Provider  donepezil (ARICEPT) 10 MG tablet Take 0.5 tablets (5 mg total) by mouth at bedtime. 12/19/20   Huston Foley, MD  levothyroxine (SYNTHROID, LEVOTHROID) 75 MCG tablet Take 75 mcg by mouth daily before breakfast.    [provider]  losartan-hydrochlorothiazide (HYZAAR) 50-12.5 MG tablet  Take 1 tablet by mouth daily.    [provider]  omeprazole (PRILOSEC) 20 MG capsule Take 1 capsule (20 mg total) by mouth daily. 03/09/16   Palumbo, April, MD  sertraline (ZOLOFT) 100 MG tablet Take 100 mg by mouth daily. 03/30/20   [provider]  TRAVATAN Z 0.004 % SOLN ophthalmic solution Place 1 drop into the right eye daily at 6 PM.  12/10/15   [provider]    Allergies    Patient has no known allergies.  Review of Systems   Review of Systems  All other systems reviewed and are negative.   Physical Exam Updated Vital Signs BP (!) 164/83   Pulse 74   Temp 98.9 F (37.2 C) (Oral)   Resp 17   Ht 1.549 m (5\' 1" )   Wt 75.3 kg   SpO2 100%   BMI 31.35 kg/m   Physical Exam Vitals and nursing note reviewed.  Constitutional:      General: She is not in acute distress.    Appearance: Normal appearance. She is well-developed. She is not  toxic-appearing.  HENT:     Head: Normocephalic and atraumatic.  Eyes:     General: Lids are normal.     Conjunctiva/sclera: Conjunctivae normal.     Pupils: Pupils are equal, round, and reactive to light.  Neck:     Thyroid: No thyroid mass.     Trachea: No tracheal deviation.  Cardiovascular:     Rate and Rhythm: Normal rate and regular rhythm.     Heart sounds: Normal heart sounds. No murmur heard. No gallop.   Pulmonary:     Effort: Pulmonary effort is normal. No respiratory distress.     Breath sounds: Normal breath sounds. No stridor. No decreased breath sounds, wheezing, rhonchi or rales.  Abdominal:     General: Bowel sounds are normal. There is no distension.     Palpations: Abdomen is soft.     Tenderness: There is no abdominal tenderness. There is no rebound.  Musculoskeletal:        General: No tenderness. Normal range of motion.     Cervical back: Normal range of motion and neck supple.  Skin:    General: Skin is warm and dry.     Findings: No abrasion or rash.  Neurological:     Mental Status: She is alert and oriented to person, place, and time.     GCS: GCS eye subscore is 4. GCS verbal subscore is 5. GCS motor subscore is 6.     Cranial Nerves: No cranial nerve deficit.     Sensory: No sensory deficit.  Psychiatric:        Speech: Speech normal.        Behavior: Behavior normal.     ED Results / Procedures / Treatments   Labs (all labs ordered are listed, but only abnormal results are displayed) Labs Reviewed  CBC WITH DIFFERENTIAL/PLATELET - Abnormal; Notable for the following components:      Result Value   Platelets 403 (*)    All other components within normal limits  COMPREHENSIVE METABOLIC PANEL  LIPASE, BLOOD    EKG None  Radiology No results found.  Procedures Procedures   Medications Ordered in ED Medications  ondansetron (ZOFRAN-ODT) disintegrating tablet 8 mg (has no administration in time range)  ondansetron (ZOFRAN-ODT)  disintegrating tablet 4 mg (4 mg Oral Given 02/17/21 1949)    ED Course  I have reviewed the triage vital signs and  the nursing notes.  Pertinent labs & imaging results that were available during my care of the patient were reviewed by me and considered in my medical decision making (see chart for details).    MDM Rules/Calculators/A&P                          Patient's abdominal exam is benign at this time.  Labs are reassuring here.  Given Zofran for her nausea.  Unclear etiology of was negative for nauseated.  Some associated with belching which she says she is had for years.  Recommend she see her PCP for GI referral.  Will prescribe Zofran Final Clinical Impression(s) / ED Diagnoses Final diagnoses:  None    Rx / DC Orders ED Discharge Orders    None       Lorre Nick, MD 02/17/21 2016

## 2021-02-17 NOTE — ED Provider Notes (Signed)
Emergency Medicine Provider Triage Evaluation Note  BANEEN WIESELER , a 60 y.o. female  was evaluated in triage.  Pt complains of nausea and dry heaving.  Symptoms began 2 days ago but improved with ginger ale and Pepto-Bismol.  Yesterday she felt fine.  However when she woke up this morning continued having nausea but denies any vomiting.  States that her stomach feels upset.  Denies any urinary symptoms or sick contacts with similar symptoms.  Review of Systems  Positive: Nausea, abdominal discomfort Negative: Changes to bowel movements or urination  Physical Exam  BP (!) 154/80 (BP Location: Right Arm)   Pulse 88   Temp 98.7 F (37.1 C) (Oral)   Resp 16   Ht 5\' 1"  (1.549 m)   SpO2 99%   BMI 31.24 kg/m  Gen:   Awake, no distress   Resp:  Normal effort  MSK:   Moves extremities without difficulty  Other:  Abdomen is soft and patient is heaving on exam  Medical Decision Making  Medically screening exam initiated at 12:49 PM.  Appropriate orders placed.  JOZEY JANCO was informed that the remainder of the evaluation will be completed by another provider, this initial triage assessment does not replace that evaluation, and the importance of remaining in the ED until their evaluation is complete.  Will order lab work and ODT Zofran   [name], PA-C 02/17/21 1250    04/19/21, MD 02/17/21 1254

## 2021-02-20 ENCOUNTER — Other Ambulatory Visit: Payer: Self-pay

## 2021-02-20 ENCOUNTER — Other Ambulatory Visit: Payer: Self-pay | Admitting: Family Medicine

## 2021-02-20 ENCOUNTER — Ambulatory Visit (HOSPITAL_BASED_OUTPATIENT_CLINIC_OR_DEPARTMENT_OTHER)
Admission: RE | Admit: 2021-02-20 | Discharge: 2021-02-20 | Disposition: A | Discharge status: Home / Self Care | Payer: Managed Care, Other (non HMO) | Source: Ambulatory Visit | Attending: Family Medicine | Admitting: Family Medicine

## 2021-02-20 DIAGNOSIS — R109 Unspecified abdominal pain: Secondary | ICD-10-CM | POA: Insufficient documentation

## 2021-02-20 MED ORDER — IOHEXOL 300 MG/ML  SOLN
75.0000 mL | Freq: Once | INTRAMUSCULAR | Status: AC | PRN
  Administered 2021-02-20: 75 mL via INTRAVENOUS

## 2021-02-21 ENCOUNTER — Other Ambulatory Visit: Payer: Managed Care, Other (non HMO)

## 2021-03-02 ENCOUNTER — Other Ambulatory Visit (HOSPITAL_COMMUNITY): Payer: Self-pay

## 2021-03-02 ENCOUNTER — Telehealth (HOSPITAL_COMMUNITY): Payer: Self-pay

## 2021-03-02 DIAGNOSIS — R1310 Dysphagia, unspecified: Secondary | ICD-10-CM

## 2021-03-02 NOTE — Telephone Encounter (Signed)
Called and spoke with brother of patient to schedule OP MBS - explained to him why MD sent over order. He stated he needed to take a call and will call back. Will follow up next week if no return call.

## 2021-03-13 DIAGNOSIS — Z0289 Encounter for other administrative examinations: Secondary | ICD-10-CM

## 2021-03-14 ENCOUNTER — Ambulatory Visit (HOSPITAL_COMMUNITY)
Admission: RE | Admit: 2021-03-14 | Discharge: 2021-03-14 | Disposition: A | Discharge status: Home / Self Care | Payer: Managed Care, Other (non HMO) | Source: Ambulatory Visit | Attending: Family Medicine | Admitting: Family Medicine

## 2021-03-14 ENCOUNTER — Other Ambulatory Visit: Payer: Self-pay

## 2021-03-14 ENCOUNTER — Encounter (HOSPITAL_COMMUNITY): Payer: Self-pay

## 2021-03-14 DIAGNOSIS — R1310 Dysphagia, unspecified: Secondary | ICD-10-CM

## 2021-03-14 NOTE — Therapy (Signed)
SLP Cancellation Note  Patient Details Name: Debbie Brandt MRN: [id number] DOB: [birth year]   Cancelled treatment:        Pt and brother present in flouro room.  However upon review of clinical indications *belching* on order and pt report of only "belching and abdomen pain*, suspect MBS was ordered in error.  Pt denies symptoms of oropharyngeal dysphagia *coughing, choking" with po intake,  therefore MBS was cancelled.    Advised pt and her brother to call referring MD to seek guidance in testing desired, consider requesting "esophagus" to evaluate symptoms presented.  Pt's brother also reports pt is to have esophageal endoscopy in July 2022.     SLP wrote testing information on paper and provided to pt's brother for his reference.  Pt and brother politely agreeable to plan.   Rolena Infante, MS [facility] SLP Acute Rehab Services Office [phone] Pager [phone]   Chales Abrahams 03/14/2021, 1:05 PM

## 2021-03-22 ENCOUNTER — Ambulatory Visit: Payer: Managed Care, Other (non HMO) | Admitting: Family Medicine

## 2021-03-26 ENCOUNTER — Telehealth: Payer: Self-pay | Admitting: Neurology

## 2021-03-26 NOTE — Telephone Encounter (Signed)
Pt's brother called in regards to forms for disability, requesting a call back. Please advise.

## 2021-03-26 NOTE — Telephone Encounter (Signed)
I returned the Pt's brother call. Pt is requesting we fill out paper work due to pt not being able to preform job function due to cognitive decline/ memory loss.  Dr. Frances Furbish and I discussed these forms verbally a few weeks ago and we were waiting for confirmation from the pt's brother.   I will complete information for forms and have Dr. Frances Furbish review once she is back in the office tomorrow.  Brother was agreeable to this plan.

## 2021-04-17 NOTE — Telephone Encounter (Signed)
Pt's brother called, wanting to know if the disability forms have been completed. Says he hasn't heard anything from anybody. Please advise.

## 2021-04-17 NOTE — Telephone Encounter (Signed)
Form completed.

## 2021-04-17 NOTE — Telephone Encounter (Signed)
Form has been sent to medical records for processing.

## 2021-04-18 ENCOUNTER — Telehealth: Payer: Self-pay | Admitting: *Deleted

## 2021-04-18 NOTE — Telephone Encounter (Signed)
Pt FMLA form Faxed and e-mail to [employer]

## 2021-05-17 NOTE — Progress Notes (Deleted)
No chief complaint on file.    HISTORY OF PRESENT ILLNESS: 05/17/21 ALL:  Debbie Brandt is a 60 y.o. female here today for follow up for memory loss. She was started on donepezil $RemoveBefo'5mg'OizPALyERVP$  daily at last follow up with Dr Rexene Alberts 12/2020.    HISTORY (copied from Dr Guadelupe Sabin previous note 12/2020)  Debbie Brandt is a 60 year old right-handed woman with an underlying medical history of hypertension, hypothyroidism, reflux disease, hyperlipidemia, kidney stones, depression, and overweight state, who presents for follow up consultation of her memory loss. The patient is accompanied by her brother again today. I first met her on 04/04/2020, at which time her brother provided most of the history and reported that she had short-term memory issues for the past 6 to 12 months, including repeating herself and forgetfulness. Her MMSE was 22 out of 30 at the time. She had undergone recent work-up through her primary care, including MRI brain, which was benign and blood work; I suggested further evaluation with a sleep study as well as neuropsychological evaluation.  She was agreeable.     She had a home sleep test on 08/07/2020 which showed very mild/borderline obstructive sleep apnea with an AHI of 5.7/h, O2 nadir 93%.   She had evaluation through neuropsychology with Dr. Ilean Skill on 08/29/2020 and I reviewed results and recommendations, he had a feedback appointment with the patient on 10/12/2020. 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 Moderate deficits in processing speed and select executive abilities (i.e., verbal and non-verbal abstract reasoning, sequencing, perseverative responding) were also observed. Additionally, there is reported evidence that her cognitive deficits are interfering with her ability to manage  complex tasks, such as managing finances and some work related tasks; she is currently a Scientist, water quality at [employer].  As such, diagnostic criteria for a major neurocognitive disorder are met.   The patient's cognitive profile is somewhat variable and suggests possibility of diffuse areas of involvement including both cortical and subcortical areas. It is possible that her cardiovascular risk factors and chronic underlying medical conditions are playing a role and possibly exacerbate cognitive dysfunction, particularly reductions in processing speed. Given historical report of "loud snoring" by her deceased husband (per brothers report), the possibility of untreated obstructive sleep apnea or another related sleep disorder should be further explored due to the known impact on learning and memory.  Results from recent imaging do not show signs of marked degeneration or atrophy often found in cortical dementias. However, an underlying neurodegenerative disease process such as Alzheimer's disease cannot be ruled out and still remains a likely etiology, given amnestic profile on certain memory tests, reported family history (e.g., sister died with early onset AD) , and other clinical features (Impairment in complex ADLs and difficulties with occupational performance). Fortunately, there is no evidence of recent depression or anxiety. There is also no evidence for visual hallucinations or delusions. Her cognitive functioning should be monitored through repeat neuropsychological assessment.  Final diagnostic impression will be rendered by Dr. Rexene Alberts at the time of follow-up, integrating the present neuropsychological findings with the results from neurological examination, neuroimaging, and laboratory studies.   Recommendations: Based on the findings of the present evaluation, the following recommendations are offered:   1. The patient may be an appropriate candidate for cholinesterase inhibitor therapy. She will  follow up with Dr. Rexene Alberts about this. 2.  It is recommended that she participate in a sleep study to assess for OSA.  3. The patient should continue to receive assistance with complex ADLs, including finances, cooking and transportation if needed.  4. The patient should limit or refrain from driving, as deficits noted on testing could affect one's ability to safely operate a motor vehicle.  At minimum, it is recommended that this person undergo a formal driving evaluation. Could contact [place] at: [phone].   5. Her brother or a trusted friend/caregiver should monitor her medications to make sure they are being taken correctly.  6. The patient should continue to participate in activities which provide mental stimulation, safe cardiovascular exercise, and social interaction. 7. Neuropsychological re-assessment in one year is recommended in order to monitor cognitive status, track progression of symptoms and further assist with treatment planning.  Today, 12/19/2020: She reports feeling stable, no new issues, brother reports that she has been stable, he has had no new concerns.  He sees her about twice a week, he often brings food, she does not tend to cut, make sandwiches mostly, drinks fairly well with water and some diet soda at times, not daily.  She has not had any new medical issues but no longer takes amlodipine, he reports that she only takes 4 medications, brought her medication bottles and amlodipine was not on it, he is not sure how long she has been off of it, not sure when her next primary care appointment is.   The patient's allergies, current medications, family history, past medical history, past social history, past surgical history and problem list were reviewed and updated as appropriate.   REVIEW OF SYSTEMS: Out of a complete 14 system review of symptoms, the patient complains only of the following symptoms, memory loss and all other reviewed systems are  negative.   ALLERGIES: No Known Allergies   HOME MEDICATIONS: Outpatient Medications Prior to Visit  Medication Sig Dispense Refill   donepezil (ARICEPT) 10 MG tablet Take 0.5 tablets (5 mg total) by mouth at bedtime. 15 tablet 5   levothyroxine (SYNTHROID, LEVOTHROID) 75 MCG tablet Take 75 mcg by mouth daily before breakfast.     losartan-hydrochlorothiazide (HYZAAR) 50-12.5 MG tablet Take 1 tablet by mouth daily.     omeprazole (PRILOSEC) 20 MG capsule Take 1 capsule (20 mg total) by mouth daily. 30 capsule 0   ondansetron (ZOFRAN ODT) 8 MG disintegrating tablet Take 1 tablet (8 mg total) by mouth every 8 (eight) hours as needed for nausea or vomiting. 20 tablet 0   sertraline (ZOLOFT) 100 MG tablet Take 100 mg by mouth daily.     TRAVATAN Z 0.004 % SOLN ophthalmic solution Place 1 drop into the right eye daily at 6 PM.      No facility-administered medications prior to visit.     PAST MEDICAL HISTORY: Past Medical History:  Diagnosis Date   Chest pain 09/26/2015   Depression    Essential hypertension 09/26/2015   GERD (gastroesophageal reflux disease)    Hypertension    Hypothyroidism 09/26/2015   Murmur 09/26/2015   Thyroid disease      PAST SURGICAL HISTORY: Past Surgical History:  Procedure Laterality Date   ABDOMINAL HYSTERECTOMY     CESAREAN SECTION     LAPAROSCOPIC ABDOMINAL EXPLORATION N/A 11/21/2017   Procedure: LAPAROSCOPIC ABDOMINAL EXPLORATION LYSIS OF ADHESIONS;  Surgeon: Stark Klein, MD;  Location: [facility];  Service: General;  Laterality: N/A;   LAPAROTOMY N/A 10/28/2016   Procedure: EXPLORATORY LAPAROTOMY  WITH  LYSIS OF ADHESIONS;  Surgeon: Jackolyn Confer, MD;  Location: [facility];  Service: General;  Laterality: N/A;     FAMILY HISTORY: Family History  Problem Relation Age of Onset   Cancer Mother        lung cancer   Hypertension Mother    Cancer Father    Heart attack Father    Mental illness Sister    Hypertension Sister    Hyperlipidemia Sister     Diabetes Brother    Hypertension Brother      SOCIAL HISTORY: Social History   Socioeconomic History   Marital status: Widowed    Spouse name: Not on file   Number of children: Not on file   Years of education: Not on file   Highest education level: Not on file  Occupational History   Not on file  Tobacco Use   Smoking status: Never   Smokeless tobacco: Never  Vaping Use   Vaping Use: Never used  Substance and Sexual Activity   Alcohol use: No    Alcohol/week: 0.0 standard drinks   Drug use: No   Sexual activity: Not Currently    Comment: 1st intercourse- 15, partners3, widow  Other Topics Concern   Not on file  Social History Narrative   Epworth Sleepiness Scale = 6 (as of 09/26/2015)   Social Determinants of Health   Financial Resource Strain: Not on file  Food Insecurity: Not on file  Transportation Needs: Not on file  Physical Activity: Not on file  Stress: Not on file  Social Connections: Not on file  Intimate Partner Violence: Not on file     PHYSICAL EXAM  There were no vitals filed for this visit. There is no height or weight on file to calculate BMI.   Generalized: Well developed, in no acute distress  Cardiology: normal rate and rhythm, no murmur auscultated  Respiratory: clear to auscultation bilaterally    Neurological examination  Mentation: Alert oriented to time, place, history taking. Follows all commands speech and language fluent Cranial nerve II-XII: Pupils were equal round reactive to light. Extraocular movements were full, visual field were full on confrontational test. Facial sensation and strength were normal. Uvula tongue midline. Head turning and shoulder shrug  were normal and symmetric. Motor: The motor testing reveals 5 over 5 strength of all 4 extremities. Good symmetric motor tone is noted throughout.  Sensory: Sensory testing is intact to soft touch on all 4 extremities. No evidence of extinction is noted.  Coordination:  Cerebellar testing reveals good finger-nose-finger and heel-to-shin bilaterally.  Gait and station: Gait is normal. Tandem gait is normal. Romberg is negative. No drift is seen.  Reflexes: Deep tendon reflexes are symmetric and normal bilaterally.    DIAGNOSTIC DATA (LABS, IMAGING, TESTING) - I reviewed patient records, labs, notes, testing and imaging myself where available.  Lab Results  Component Value Date   WBC 4.8 02/17/2021   HGB 12.3 02/17/2021   HCT 39.2 02/17/2021   MCV 86.0 02/17/2021   PLT 403 (H) 02/17/2021      Component Value Date/Time   NA 140 02/17/2021 1415   K 3.5 02/17/2021 1415   CL 106 02/17/2021 1415   CO2 28 02/17/2021 1415   GLUCOSE 97 02/17/2021 1415   BUN 9 02/17/2021 1415   CREATININE 0.79 02/17/2021 1415   CREATININE 0.83 09/26/2015 0955   CALCIUM 8.9 02/17/2021 1415   PROT 7.3 02/17/2021 1415   ALBUMIN 3.7 02/17/2021 1415   AST 16  02/17/2021 1415   ALT 12 02/17/2021 1415   ALKPHOS 69 02/17/2021 1415   BILITOT 0.5 02/17/2021 1415   GFRNONAA >60 02/17/2021 1415   GFRAA >60 11/26/2017 0504   No results found for: CHOL, HDL, LDLCALC, LDLDIRECT, TRIG, CHOLHDL No results found for: HGBA1C Lab Results  Component Value Date   VITAMINB12 1,450 (H) 11/23/2017   Lab Results  Component Value Date   TSH 3.262 09/26/2015    MMSE - Mini Mental State Exam 04/04/2020  Orientation to time 2  Orientation to Place 4  Registration 3  Attention/ Calculation 5  Recall 0  Language- name 2 objects 2  Language- repeat 1  Language- follow 3 step command 3  Language- read & follow direction 1  Write a sentence 1  Copy design 0  Total score 22     No flowsheet data found.   ASSESSMENT AND PLAN  60 y.o. year old female  has a past medical history of Chest pain (09/26/2015), Depression, Essential hypertension (09/26/2015), GERD (gastroesophageal reflux disease), Hypertension, Hypothyroidism (09/26/2015), Murmur (09/26/2015), and Thyroid disease. here with     No diagnosis found.   No orders of the defined types were placed in this encounter.    No orders of the defined types were placed in this encounter.     [name], MSN, FNP-C 05/17/2021, 2:27 PM  [facility] [address] [place], [place] [address] [phone]

## 2021-05-22 ENCOUNTER — Ambulatory Visit: Payer: Managed Care, Other (non HMO) | Admitting: Family Medicine

## 2021-05-22 ENCOUNTER — Encounter: Payer: Self-pay | Admitting: Family Medicine

## 2021-05-22 DIAGNOSIS — R413 Other amnesia: Secondary | ICD-10-CM

## 2021-07-12 ENCOUNTER — Encounter: Payer: Self-pay | Admitting: Psychology

## 2021-08-04 ENCOUNTER — Other Ambulatory Visit: Payer: Self-pay | Admitting: Neurology

## 2021-08-04 DIAGNOSIS — R413 Other amnesia: Secondary | ICD-10-CM

## 2021-08-07 ENCOUNTER — Telehealth: Payer: Self-pay | Admitting: Neurology

## 2021-08-07 DIAGNOSIS — R413 Other amnesia: Secondary | ICD-10-CM

## 2021-08-07 MED ORDER — DONEPEZIL HCL 10 MG PO TABS: 5.0000 mg | ORAL_TABLET | Freq: Every day | ORAL | 5 refills | Status: DC

## 2021-08-07 NOTE — Telephone Encounter (Signed)
Pt's brother [name] called states he received a call from [facility] [phone], that the donepezil (ARICEPT) 10 MG tablet has been denied and he is not sure why. Pt is needing a refill.

## 2021-08-07 NOTE — Telephone Encounter (Signed)
I spoke to brother of pt.  Pt has been taking 0.5 tablet po qhs.  She is not working now due to her memory issues. She has applied to SSD.  She has no insurance. I made an appt for 12-03-21 with AL/NP.  Did renew for 6 months the donepezil.  (Look into [facility] for lower cost).  He appreciated understanding.

## 2021-12-03 ENCOUNTER — Ambulatory Visit: Payer: Self-pay | Admitting: Family Medicine

## 2022-01-14 NOTE — Progress Notes (Signed)
? ?New Patient Office Visit ? ?Subjective   ? ?Patient ID: Debbie Brandt, female    DOB: Sep 19, 1960  Age: 61 y.o. MRN: [id number] ? ?CC:  ?Chief Complaint  ?Patient presents with  ? New patient  ?  Left leg pain sometimes  ? ? ?HPI ?Debbie FlemingsCynthia L Yoak presents to establish care. Her husband is with her today.  ? ?She complains of left knee pain for the past 2 months. No known injury.  ?Pain with activity and worse with steps.  ?Takes Advil occasionally. No locking or giving away of her knee.  ? ?Reports mood is good but does take sertraline for anxiety.  ? ?HTN- taking losartain-HCTZ 50-12.5mg  daily. Does not check BP at home.  ? ?Memory loss- taking donepezil. Followed by neurologist.  ? ?Hypothyroidism- taking levothyroxine daily.  ? ?No other concerns today.  ? ?Denies fever, chills, dizziness, chest pain, palpitations, shortness of breath, abdominal pain, N/V/D, urinary symptoms, LE edema.  ? ? ? ? ?Outpatient Encounter Medications as of 01/15/2022  ?Medication Sig  ? atorvastatin (LIPITOR) 10 MG tablet Take 10 mg by mouth daily.  ? diclofenac Sodium (VOLTAREN) 1 % GEL Apply 4 g topically 4 (four) times daily.  ? donepezil (ARICEPT) 10 MG tablet Take 0.5 tablets (5 mg total) by mouth at bedtime.  ? levothyroxine (SYNTHROID, LEVOTHROID) 75 MCG tablet Take 75 mcg by mouth daily before breakfast.  ? losartan-hydrochlorothiazide (HYZAAR) 50-12.5 MG tablet Take 1 tablet by mouth daily.  ? omeprazole (PRILOSEC) 20 MG capsule Take 1 capsule (20 mg total) by mouth daily.  ? ondansetron (ZOFRAN ODT) 8 MG disintegrating tablet Take 1 tablet (8 mg total) by mouth every 8 (eight) hours as needed for nausea or vomiting.  ? sertraline (ZOLOFT) 100 MG tablet Take 100 mg by mouth daily.  ? TRAVATAN Z 0.004 % SOLN ophthalmic solution Place 1 drop into the right eye daily at 6 PM.   ? ?No facility-administered encounter medications on file as of 01/15/2022.  ? ? ?Past Medical History:  ?Diagnosis Date  ? Chest pain 09/26/2015  ?  Depression   ? Essential hypertension 09/26/2015  ? GERD (gastroesophageal reflux disease)   ? Hypertension   ? Hypothyroidism 09/26/2015  ? Murmur 09/26/2015  ? Thyroid disease   ? ? ?Past Surgical History:  ?Procedure Laterality Date  ? ABDOMINAL HYSTERECTOMY    ? CESAREAN SECTION    ? LAPAROSCOPIC ABDOMINAL EXPLORATION N/A 11/21/2017  ? Procedure: LAPAROSCOPIC ABDOMINAL EXPLORATION LYSIS OF ADHESIONS;  Surgeon: Almond LintByerly, Faera, MD;  Location: [facility];  Service: General;  Laterality: N/A;  ? LAPAROTOMY N/A 10/28/2016  ? Procedure: EXPLORATORY LAPAROTOMY WITH  LYSIS OF ADHESIONS;  Surgeon: Avel Peaceodd Rosenbower, MD;  Location: [facility];  Service: General;  Laterality: N/A;  ? ? ?Family History  ?Problem Relation Age of Onset  ? Cancer Mother   ?     lung cancer  ? Hypertension Mother   ? Cancer Father   ? Heart attack Father   ? Mental illness Sister   ? Hypertension Sister   ? Hyperlipidemia Sister   ? Diabetes Brother   ? Hypertension Brother   ? ? ?Social History  ? ?Socioeconomic History  ? Marital status: Widowed  ?  Spouse name: Not on file  ? Number of children: Not on file  ? Years of education: Not on file  ? Highest education level: Not on file  ?Occupational History  ? Not on file  ?Tobacco Use  ? Smoking status:  Never  ? Smokeless tobacco: Never  ?Vaping Use  ? Vaping Use: Never used  ?Substance and Sexual Activity  ? Alcohol use: No  ?  Alcohol/week: 0.0 standard drinks  ? Drug use: No  ? Sexual activity: Not Currently  ?  Comment: 1st intercourse- 58, partners3, widow  ?Other Topics Concern  ? Not on file  ?Social History Narrative  ? Epworth Sleepiness Scale = 6 (as of 09/26/2015)  ? ?Social Determinants of Health  ? ?Financial Resource Strain: Not on file  ?Food Insecurity: Not on file  ?Transportation Needs: Not on file  ?Physical Activity: Not on file  ?Stress: Not on file  ?Social Connections: Not on file  ?Intimate Partner Violence: Not on file  ? ? ?ROS ?Pertinent positives and negatives in the history of  present illness. ? ?  ? ? ?Objective   ? ?BP 130/78 (BP Location: Right Arm, Patient Position: Sitting, Cuff Size: Large)   Pulse 68   Temp 98.4 ?F (36.9 ?C) (Oral)   Ht 5' 3.58" (1.615 m)   Wt 182 lb (82.6 kg)   SpO2 98%   BMI 31.65 kg/m?  ? ?Physical Exam ?Constitutional:   ?   General: She is not in acute distress. ?   Appearance: She is not ill-appearing.  ?Cardiovascular:  ?   Rate and Rhythm: Normal rate.  ?   Pulses: Normal pulses.  ?Pulmonary:  ?   Effort: Pulmonary effort is normal.  ?Musculoskeletal:  ?   Cervical back: Normal range of motion and neck supple.  ?   Left knee: No swelling or effusion. Normal range of motion. Tenderness present over the medial joint line.  ?Skin: ?   General: Skin is warm and dry.  ?Neurological:  ?   Mental Status: She is alert. Mental status is at baseline.  ?   Gait: Gait normal.  ?Psychiatric:     ?   Mood and Affect: Mood normal.     ?   Behavior: Behavior normal.  ? ? ? ? ?  ? ?Assessment & Plan:  ? ?Problem List Items Addressed This Visit   ? ?  ? Cardiovascular and Mediastinum  ? HTN (hypertension) - Primary  ?  BP controlled. Continue medication.  ? ?  ?  ? Relevant Medications  ? atorvastatin (LIPITOR) 10 MG tablet  ? Other Relevant Orders  ? CBC with Differential/Platelet (Completed)  ? Comprehensive metabolic panel (Completed)  ?  ? Endocrine  ? Hypothyroidism (Chronic)  ?  Appears to be taking medication appropriately. Check TSH and follow up ? ?  ?  ? Relevant Orders  ? TSH (Completed)  ?  ? Other  ? Acute pain of left knee  ?  This has been ongoing x 2 months. No red flag symptoms. She may try Voltaren gel and Tylenol or Advil as needed. Knee X ray ordered. Follow up if worsening or not improving and consider ortho referral or PT.  ? ?  ?  ? Relevant Medications  ? diclofenac Sodium (VOLTAREN) 1 % GEL  ? Other Relevant Orders  ? DG Knee Complete 4 Views Left  ? Memory loss  ?  Memory loss appears to be significant and she will continue Aricept and follow  up with neurologist. Her husband is caring for her.  ? ?  ?  ? Relevant Orders  ? CBC with Differential/Platelet (Completed)  ? Comprehensive metabolic panel (Completed)  ? TSH (Completed)  ? Vitamin B12 (Completed)  ? Obesity (  BMI 30-39.9)  ?  Screen for diabetes. Exercise is limited due to knee pain.  ? ?  ?  ? Relevant Orders  ? Hemoglobin A1c (Completed)  ? ?Other Visit Diagnoses   ? ? Low serum magnesium level      ? Relevant Orders  ? Magnesium (Completed)  ? ?  ? ? ?Return for pending labs.  ? ?Hetty Blend, NP-C ? ? ?

## 2022-01-15 ENCOUNTER — Encounter: Payer: Self-pay | Admitting: Family Medicine

## 2022-01-15 ENCOUNTER — Ambulatory Visit (INDEPENDENT_AMBULATORY_CARE_PROVIDER_SITE_OTHER): Payer: No Typology Code available for payment source | Admitting: Family Medicine

## 2022-01-15 ENCOUNTER — Ambulatory Visit (INDEPENDENT_AMBULATORY_CARE_PROVIDER_SITE_OTHER): Payer: No Typology Code available for payment source

## 2022-01-15 VITALS — BP 130/78 | HR 68 | Temp 98.4°F | Ht 63.58 in | Wt 182.0 lb

## 2022-01-15 DIAGNOSIS — I10 Essential (primary) hypertension: Secondary | ICD-10-CM

## 2022-01-15 DIAGNOSIS — E669 Obesity, unspecified: Secondary | ICD-10-CM | POA: Diagnosis not present

## 2022-01-15 DIAGNOSIS — M25562 Pain in left knee: Secondary | ICD-10-CM

## 2022-01-15 DIAGNOSIS — E033 Postinfectious hypothyroidism: Secondary | ICD-10-CM | POA: Diagnosis not present

## 2022-01-15 DIAGNOSIS — R413 Other amnesia: Secondary | ICD-10-CM | POA: Diagnosis not present

## 2022-01-15 LAB — COMPREHENSIVE METABOLIC PANEL
ALT: 16 U/L (ref 0–35)
AST: 17 U/L (ref 0–37)
Albumin: 4.2 g/dL (ref 3.5–5.2)
Alkaline Phosphatase: 97 U/L (ref 39–117)
BUN: 16 mg/dL (ref 6–23)
CO2: 32 mEq/L (ref 19–32)
Calcium: 9.6 mg/dL (ref 8.4–10.5)
Chloride: 99 mEq/L (ref 96–112)
Creatinine, Ser: 1.19 mg/dL (ref 0.40–1.20)
GFR: 48.8 mL/min — ABNORMAL LOW (ref >60.00)
Glucose, Bld: 98 mg/dL (ref 70–99)
Potassium: 4.1 mEq/L (ref 3.5–5.1)
Sodium: 137 mEq/L (ref 135–145)
Total Bilirubin: 0.4 mg/dL (ref 0.2–1.2)
Total Protein: 7.6 g/dL (ref 6.0–8.3)

## 2022-01-15 LAB — CBC WITH DIFFERENTIAL/PLATELET
Basophils Absolute: 0 10*3/uL (ref 0.0–0.1)
Basophils Relative: 0.6 % (ref 0.0–3.0)
Eosinophils Absolute: 0 10*3/uL (ref 0.0–0.7)
Eosinophils Relative: 0.9 % (ref 0.0–5.0)
HCT: 38.3 % (ref 36.0–46.0)
Hemoglobin: 12.6 g/dL (ref 12.0–15.0)
Lymphocytes Relative: 34.9 % (ref 12.0–46.0)
Lymphs Abs: 1.5 10*3/uL (ref 0.7–4.0)
MCHC: 32.9 g/dL (ref 30.0–36.0)
MCV: 81.8 fl (ref 78.0–100.0)
Monocytes Absolute: 0.3 10*3/uL (ref 0.1–1.0)
Monocytes Relative: 8 % (ref 3.0–12.0)
Neutro Abs: 2.4 10*3/uL (ref 1.4–7.7)
Neutrophils Relative %: 55.6 % (ref 43.0–77.0)
Platelets: 422 10*3/uL — ABNORMAL HIGH (ref 150.0–400.0)
RBC: 4.68 Mil/uL (ref 3.87–5.11)
RDW: 13.9 % (ref 11.5–15.5)
WBC: 4.3 10*3/uL (ref 4.0–10.5)

## 2022-01-15 LAB — TSH: TSH: 2.88 u[IU]/mL (ref 0.35–5.50)

## 2022-01-15 LAB — MAGNESIUM: Magnesium: 2 mg/dL (ref 1.5–2.5)

## 2022-01-15 LAB — VITAMIN B12: Vitamin B-12: 271 pg/mL (ref 211–911)

## 2022-01-15 LAB — HEMOGLOBIN A1C: Hgb A1c MFr Bld: 6.2 % (ref 4.6–6.5)

## 2022-01-15 MED ORDER — DICLOFENAC SODIUM 1 % EX GEL: 4.0000 g | Freq: Four times a day (QID) | CUTANEOUS | 1 refills | Status: DC

## 2022-01-15 NOTE — Assessment & Plan Note (Signed)
Screen for diabetes. Exercise is limited due to knee pain.  ?

## 2022-01-15 NOTE — Progress Notes (Signed)
Knee XR shows mild osteoarthritis. We will see if she notices any improvement with conservative treatment discussed today including elevating her knee when sitting, icing it, resting and using topical Voltaren gel. Follow up if she is not improving in the next couple of weeks to one month.

## 2022-01-15 NOTE — Assessment & Plan Note (Signed)
Memory loss appears to be significant and she will continue Aricept and follow up with neurologist. Her husband is caring for her.  ?

## 2022-01-15 NOTE — Assessment & Plan Note (Signed)
This has been ongoing x 2 months. No red flag symptoms. She may try Voltaren gel and Tylenol or Advil as needed. Knee X ray ordered. Follow up if worsening or not improving and consider ortho referral or PT.  ?

## 2022-01-15 NOTE — Patient Instructions (Addendum)
Go downstairs for your knee X ray and blood work.  ? ?Start using the Voltaren gel for your knee pain. Elevate your knee when sitting and ice it for pain or swelling.  ?You may take Tylenol or ibuprofen as needed.  ? ?Continue your current medications and we will be in touch with your lab and X ray results.  ? ?

## 2022-01-15 NOTE — Assessment & Plan Note (Signed)
Appears to be taking medication appropriately. Check TSH and follow up ?

## 2022-01-15 NOTE — Progress Notes (Signed)
Call her husband (per patient request) and let him know that her vitamin B12 is low so I recommend that she start taking over the counter vitamin B 12 1,000 or 2,000 mcg daily.  ?Her blood sugars are in prediabetes range so be careful with sugar and carbohydrates such as bread, potatoes, pasta, rice, etc.  ?Please remind her husband to follow up with patient's neurologist for memory issues.

## 2022-01-15 NOTE — Assessment & Plan Note (Signed)
BP controlled. Continue medication.  ?

## 2022-01-16 ENCOUNTER — Telehealth: Payer: Self-pay | Admitting: Family Medicine

## 2022-01-16 NOTE — Telephone Encounter (Signed)
Called and LM on his phone with pt's results as requested. ?

## 2022-01-16 NOTE — Telephone Encounter (Signed)
Pts brother returning call, cma unavailable ? ?Requesting cb and vm left if he does not answer ?

## 2022-01-18 ENCOUNTER — Telehealth: Payer: Self-pay

## 2022-01-18 NOTE — Telephone Encounter (Signed)
Pt's brother called who is pt's POA, he went to pick up pt's voltaren gel at the pharmacy and they notified him that she is allergic to it so he did not pick it up. I apologized for the inconvenience as this was something that was noted on her file but we will document. Pt brother is wondering if there is something else that can be sent in instead for her mild osteoarthritis?  ?

## 2022-01-18 NOTE — Telephone Encounter (Signed)
Called and notified her brother of the alternatives pt can use in place of the voltaren gel. He verbalized understanding and states they will try that.  ?

## 2022-02-18 ENCOUNTER — Other Ambulatory Visit: Payer: Self-pay

## 2022-02-18 ENCOUNTER — Telehealth: Payer: Self-pay | Admitting: Family Medicine

## 2022-02-18 MED ORDER — LOSARTAN POTASSIUM-HCTZ 50-12.5 MG PO TABS: 1.0000 | ORAL_TABLET | Freq: Every day | ORAL | 5 refills | Status: DC

## 2022-02-18 MED ORDER — LEVOTHYROXINE SODIUM 75 MCG PO TABS: 75.0000 ug | ORAL_TABLET | Freq: Every day | ORAL | 5 refills | Status: DC

## 2022-02-18 NOTE — Telephone Encounter (Signed)
Rx refilled.   She has not followed up with neurology as of yet.

## 2022-02-18 NOTE — Telephone Encounter (Signed)
1.Medication Requested: levothyroxine (SYNTHROID, LEVOTHROID) 75 MCG tablet losartan-hydrochlorothiazide (HYZAAR) 50-12.5 MG tablet 2. Pharmacy (Name, Street, [facility]): [id number] - [place] Phone:  [phone]  Fax:  [phone]     3. On Med List: yes  4. Last Visit with PCP:  5. Next visit date with PCP:   Agent: Please be advised that RX refills may take up to 3 business days. We ask that you follow-up with your pharmacy.

## 2022-02-18 NOTE — Telephone Encounter (Signed)
This will be her first fill with you.. ok to refill?  

## 2022-03-20 ENCOUNTER — Telehealth: Payer: Self-pay | Admitting: Family Medicine

## 2022-03-20 DIAGNOSIS — E785 Hyperlipidemia, unspecified: Secondary | ICD-10-CM

## 2022-03-20 MED ORDER — ATORVASTATIN CALCIUM 10 MG PO TABS: 10.0000 mg | ORAL_TABLET | Freq: Every day | ORAL | 0 refills | Status: DC

## 2022-03-20 NOTE — Telephone Encounter (Signed)
This will be her first fill with you.. ok to refill?  

## 2022-03-20 NOTE — Telephone Encounter (Signed)
1.Medication Requested: atorvastatin (LIPITOR) 10 MG tablet 2. Pharmacy (Name, Street, [facility]): [facility] [id number] - [place], [place] - [address] Phone:  [phone]  Fax:  [phone]     3. On Med List: yes  4. Last Visit with PCP: 5.2.2023  5. Next visit date with PCP: n/a   Agent: Please be advised that RX refills may take up to 3 business days. We ask that you follow-up with your pharmacy.

## 2022-03-20 NOTE — Addendum Note (Signed)
Addended by: Marinus Maw on: 03/20/2022 04:23 PM   Modules accepted: Orders

## 2022-03-20 NOTE — Telephone Encounter (Signed)
Spoke with Pt's brother, they will come for an office visit 03/26/22 to get the lipid panel. Could not remember how long pt had been taking medication. Please send refill to [facility] [address].   [facility]: [phone]

## 2022-03-20 NOTE — Telephone Encounter (Signed)
LM for a return call.  

## 2022-03-20 NOTE — Telephone Encounter (Signed)
Labs ordered, rx sent

## 2022-03-26 ENCOUNTER — Ambulatory Visit: Payer: No Typology Code available for payment source | Admitting: Family Medicine

## 2022-03-26 ENCOUNTER — Other Ambulatory Visit: Payer: No Typology Code available for payment source

## 2022-04-02 ENCOUNTER — Other Ambulatory Visit (INDEPENDENT_AMBULATORY_CARE_PROVIDER_SITE_OTHER): Payer: No Typology Code available for payment source

## 2022-04-02 DIAGNOSIS — E785 Hyperlipidemia, unspecified: Secondary | ICD-10-CM | POA: Diagnosis not present

## 2022-04-02 LAB — LIPID PANEL
Cholesterol: 177 mg/dL (ref 0–200)
HDL: 53.7 mg/dL (ref >39.00)
LDL Cholesterol: 113 mg/dL — ABNORMAL HIGH (ref 0–99)
NonHDL: 123.55
Total CHOL/HDL Ratio: 3
Triglycerides: 55 mg/dL (ref 0.0–149.0)
VLDL: 11 mg/dL (ref 0.0–40.0)

## 2022-04-03 ENCOUNTER — Other Ambulatory Visit: Payer: Self-pay | Admitting: Family Medicine

## 2022-04-03 DIAGNOSIS — E785 Hyperlipidemia, unspecified: Secondary | ICD-10-CM

## 2022-04-03 MED ORDER — ATORVASTATIN CALCIUM 20 MG PO TABS: 20.0000 mg | ORAL_TABLET | Freq: Every day | ORAL | 1 refills | Status: DC

## 2022-04-03 NOTE — Progress Notes (Signed)
I sent atorvastatin 20 mg to her pharmacy.  This is a higher dose than she was taking since her LDL was not in goal range.  We will recheck in 6 months

## 2022-04-06 ENCOUNTER — Other Ambulatory Visit: Payer: Self-pay | Admitting: Neurology

## 2022-04-06 DIAGNOSIS — R413 Other amnesia: Secondary | ICD-10-CM

## 2022-05-21 ENCOUNTER — Other Ambulatory Visit: Payer: Self-pay | Admitting: Neurology

## 2022-05-21 DIAGNOSIS — R413 Other amnesia: Secondary | ICD-10-CM

## 2022-06-26 ENCOUNTER — Ambulatory Visit (INDEPENDENT_AMBULATORY_CARE_PROVIDER_SITE_OTHER): Payer: No Typology Code available for payment source | Admitting: Family Medicine

## 2022-06-26 ENCOUNTER — Encounter: Payer: Self-pay | Admitting: Family Medicine

## 2022-06-26 VITALS — BP 134/72 | HR 66 | Temp 98.4°F | Ht 63.0 in | Wt 185.0 lb

## 2022-06-26 DIAGNOSIS — R7303 Prediabetes: Secondary | ICD-10-CM | POA: Diagnosis not present

## 2022-06-26 DIAGNOSIS — R441 Visual hallucinations: Secondary | ICD-10-CM

## 2022-06-26 DIAGNOSIS — I10 Essential (primary) hypertension: Secondary | ICD-10-CM

## 2022-06-26 DIAGNOSIS — E033 Postinfectious hypothyroidism: Secondary | ICD-10-CM | POA: Diagnosis not present

## 2022-06-26 DIAGNOSIS — F419 Anxiety disorder, unspecified: Secondary | ICD-10-CM

## 2022-06-26 DIAGNOSIS — R4189 Other symptoms and signs involving cognitive functions and awareness: Secondary | ICD-10-CM | POA: Diagnosis not present

## 2022-06-26 DIAGNOSIS — J3489 Other specified disorders of nose and nasal sinuses: Secondary | ICD-10-CM

## 2022-06-26 DIAGNOSIS — R630 Anorexia: Secondary | ICD-10-CM

## 2022-06-26 LAB — CBC WITH DIFFERENTIAL/PLATELET
Basophils Absolute: 0 10*3/uL (ref 0.0–0.1)
Basophils Relative: 0.5 % (ref 0.0–3.0)
Eosinophils Absolute: 0 10*3/uL (ref 0.0–0.7)
Eosinophils Relative: 0.7 % (ref 0.0–5.0)
HCT: 37.8 % (ref 36.0–46.0)
Hemoglobin: 12.5 g/dL (ref 12.0–15.0)
Lymphocytes Relative: 39.1 % (ref 12.0–46.0)
Lymphs Abs: 1.8 10*3/uL (ref 0.7–4.0)
MCHC: 33 g/dL (ref 30.0–36.0)
MCV: 81.6 fl (ref 78.0–100.0)
Monocytes Absolute: 0.4 10*3/uL (ref 0.1–1.0)
Monocytes Relative: 8.3 % (ref 3.0–12.0)
Neutro Abs: 2.3 10*3/uL (ref 1.4–7.7)
Neutrophils Relative %: 51.4 % (ref 43.0–77.0)
Platelets: 343 10*3/uL (ref 150.0–400.0)
RBC: 4.63 Mil/uL (ref 3.87–5.11)
RDW: 14.1 % (ref 11.5–15.5)
WBC: 4.6 10*3/uL (ref 4.0–10.5)

## 2022-06-26 LAB — COMPREHENSIVE METABOLIC PANEL
ALT: 13 U/L (ref 0–35)
AST: 19 U/L (ref 0–37)
Albumin: 4.1 g/dL (ref 3.5–5.2)
Alkaline Phosphatase: 84 U/L (ref 39–117)
BUN: 13 mg/dL (ref 6–23)
CO2: 30 mEq/L (ref 19–32)
Calcium: 9.6 mg/dL (ref 8.4–10.5)
Chloride: 102 mEq/L (ref 96–112)
Creatinine, Ser: 0.86 mg/dL (ref 0.40–1.20)
GFR: 71.83 mL/min (ref >60.00)
Glucose, Bld: 91 mg/dL (ref 70–99)
Potassium: 3.9 mEq/L (ref 3.5–5.1)
Sodium: 139 mEq/L (ref 135–145)
Total Bilirubin: 0.5 mg/dL (ref 0.2–1.2)
Total Protein: 7.4 g/dL (ref 6.0–8.3)

## 2022-06-26 LAB — TSH: TSH: 4.33 u[IU]/mL (ref 0.35–5.50)

## 2022-06-26 LAB — HEMOGLOBIN A1C: Hgb A1c MFr Bld: 6.1 % (ref 4.6–6.5)

## 2022-06-26 MED ORDER — SERTRALINE HCL 100 MG PO TABS: 100.0000 mg | ORAL_TABLET | Freq: Every day | ORAL | 1 refills | Status: DC

## 2022-06-26 MED ORDER — LORATADINE 10 MG PO TABS: 10.0000 mg | ORAL_TABLET | Freq: Every day | ORAL | 2 refills | Status: DC

## 2022-06-26 NOTE — Progress Notes (Signed)
Her labs are all normal including thyroid function.

## 2022-06-26 NOTE — Progress Notes (Signed)
Subjective:     Patient ID: Debbie Brandt, female    DOB: 01-19-1961, 61 y.o.   MRN: [id number]  Chief Complaint  Patient presents with   Memory Loss    Wants to talk about memory loss ( forgetful) and also about her night medication donepezil (     HPI Patient is in today with her brother Jeneen Rinks Ronalee Belts).   Here with concerns regarding worsening memory and mood x 2 months.   Brother states patient is having visual hallucinations which is new.   She is taking sertraline and donepezil. Last saw neurologist in April 2022.   Rhinorrhea- is not taking an allergy medication.   Denies fever, chills, dizziness, chest pain, palpitations, shortness of breath, abdominal pain, N/V/D, urinary symptoms, LE edema.      Health Maintenance Due  Topic Date Due   COVID-19 Vaccine (1) Never done   Hepatitis C Screening  Never done   TETANUS/TDAP  Never done   Zoster Vaccines- Shingrix (1 of 2) Never done   INFLUENZA VACCINE  04/16/2022   PAP SMEAR-Modifier  07/29/2022    Past Medical History:  Diagnosis Date   Chest pain 09/26/2015   Depression    Essential hypertension 09/26/2015   GERD (gastroesophageal reflux disease)    Hypertension    Hypothyroidism 09/26/2015   Murmur 09/26/2015   Thyroid disease     Past Surgical History:  Procedure Laterality Date   ABDOMINAL HYSTERECTOMY     CESAREAN SECTION     LAPAROSCOPIC ABDOMINAL EXPLORATION N/A 11/21/2017   Procedure: LAPAROSCOPIC ABDOMINAL EXPLORATION LYSIS OF ADHESIONS;  Surgeon: Stark Klein, MD;  Location: [facility];  Service: General;  Laterality: N/A;   LAPAROTOMY N/A 10/28/2016   Procedure: EXPLORATORY LAPAROTOMY WITH  LYSIS OF ADHESIONS;  Surgeon: Jackolyn Confer, MD;  Location: [facility];  Service: General;  Laterality: N/A;    Family History  Problem Relation Age of Onset   Cancer Mother        lung cancer   Hypertension Mother    Cancer Father    Heart attack Father    Mental illness Sister    Hypertension Sister     Hyperlipidemia Sister    Diabetes Brother    Hypertension Brother     Social History   Socioeconomic History   Marital status: Widowed    Spouse name: Not on file   Number of children: Not on file   Years of education: Not on file   Highest education level: Not on file  Occupational History   Not on file  Tobacco Use   Smoking status: Never   Smokeless tobacco: Never  Vaping Use   Vaping Use: Never used  Substance and Sexual Activity   Alcohol use: No    Alcohol/week: 0.0 standard drinks of alcohol   Drug use: No   Sexual activity: Not Currently    Comment: 1st intercourse- 15, partners3, widow  Other Topics Concern   Not on file  Social History Narrative   Epworth Sleepiness Scale = 6 (as of 09/26/2015)   Social Determinants of Health   Financial Resource Strain: Not on file  Food Insecurity: Not on file  Transportation Needs: Not on file  Physical Activity: Not on file  Stress: Not on file  Social Connections: Not on file  Intimate Partner Violence: Not on file    Outpatient Medications Prior to Visit  Medication Sig Dispense Refill   atorvastatin (LIPITOR) 20 MG tablet Take 1 tablet (20 mg total)  by mouth daily. 90 tablet 1   diclofenac Sodium (VOLTAREN) 1 % GEL Apply 4 g topically 4 (four) times daily. 150 g 1   donepezil (ARICEPT) 10 MG tablet TAKE 1/2 TABLET BY MOUTH AT BEDTIME 15 tablet 5   levothyroxine (SYNTHROID) 75 MCG tablet Take 1 tablet (75 mcg total) by mouth daily before breakfast. 30 tablet 5   losartan-hydrochlorothiazide (HYZAAR) 50-12.5 MG tablet Take 1 tablet by mouth daily. 30 tablet 5   omeprazole (PRILOSEC) 20 MG capsule Take 1 capsule (20 mg total) by mouth daily. 30 capsule 0   ondansetron (ZOFRAN ODT) 8 MG disintegrating tablet Take 1 tablet (8 mg total) by mouth every 8 (eight) hours as needed for nausea or vomiting. 20 tablet 0   TRAVATAN Z 0.004 % SOLN ophthalmic solution Place 1 drop into the right eye daily at 6 PM.      sertraline  (ZOLOFT) 100 MG tablet Take 100 mg by mouth daily.     No facility-administered medications prior to visit.    Allergies  Allergen Reactions   Voltaren [Diclofenac Sodium] Other (See Comments)    Unknown, pt went to pick up at pharmacy and they said she was allergic    ROS     Objective:    Physical Exam Constitutional:      General: She is not in acute distress.    Appearance: She is not ill-appearing.  HENT:     Mouth/Throat:     Mouth: Mucous membranes are moist.  Eyes:     Extraocular Movements: Extraocular movements intact.     Conjunctiva/sclera: Conjunctivae normal.  Cardiovascular:     Rate and Rhythm: Normal rate and regular rhythm.  Pulmonary:     Effort: Pulmonary effort is normal.     Breath sounds: Normal breath sounds.  Musculoskeletal:     Cervical back: Normal range of motion and neck supple.  Skin:    General: Skin is warm and dry.  Neurological:     General: No focal deficit present.     Mental Status: She is alert.     Motor: No weakness.     Gait: Gait normal.  Psychiatric:        Mood and Affect: Mood normal.        Speech: Speech normal.        Behavior: Behavior normal.        Thought Content: Thought content is not paranoid.        Cognition and Memory: Cognition is impaired. Memory is impaired.     BP 134/72   Pulse 66   Temp 98.4 F (36.9 C) (Oral)   Ht 5\' 3"  (1.6 m)   Wt 185 lb (83.9 kg)   SpO2 99%   BMI 32.77 kg/m  Wt Readings from Last 3 Encounters:  06/26/22 185 lb (83.9 kg)  01/15/22 182 lb (82.6 kg)  02/17/21 165 lb 14.4 oz (75.3 kg)       Assessment & Plan:   Problem List Items Addressed This Visit       Cardiovascular and Mediastinum   HTN (hypertension)    BP controlled. Continue losartan- HCTZ 50-12.5 mg daily.       Relevant Orders   CBC with Differential/Platelet (Completed)   Comprehensive metabolic panel (Completed)     Endocrine   Hypothyroidism (Chronic)    Continue levothyroxine 75 mcg daily.  Check TSH and follow up      Relevant Orders   TSH (Completed)  Other   Anxiety    Continue sertraline.       Relevant Medications   sertraline (ZOLOFT) 100 MG tablet   Other Relevant Orders   Ambulatory referral to Neurology   Cognitive impairment - Primary    Reports taking donepezil 10 mg daily. Brother is concerned that her memory and behavior is worsening. I will refer her back to neurology.       Relevant Orders   Ambulatory referral to Neurology   Poor appetite   Rhinorrhea    Try Claritin. No other URI symptoms.       Relevant Medications   loratadine (CLARITIN) 10 MG tablet   Visual hallucinations    This is new since seeing neurologist. Refer back to neuro.       Relevant Orders   Ambulatory referral to Neurology   CBC with Differential/Platelet (Completed)   Comprehensive metabolic panel (Completed)   TSH (Completed)   Other Visit Diagnoses     Prediabetes       Relevant Orders   Hemoglobin A1c (Completed)       I have changed Mackensi L. Sabia's sertraline. I am also having her start on loratadine. Additionally, I am having her maintain her Travatan Z, omeprazole, ondansetron, diclofenac Sodium, levothyroxine, losartan-hydrochlorothiazide, atorvastatin, and donepezil.  Meds ordered this encounter  Medications   sertraline (ZOLOFT) 100 MG tablet    Sig: Take 1 tablet (100 mg total) by mouth daily.    Dispense:  90 tablet    Refill:  1   loratadine (CLARITIN) 10 MG tablet    Sig: Take 1 tablet (10 mg total) by mouth daily.    Dispense:  30 tablet    Refill:  2    Order Specific Question:   Supervising Provider    Answer:   Pricilla Holm A [id number]

## 2022-06-26 NOTE — Patient Instructions (Signed)
Please go downstairs for labs before you leave today.  Continue current medications and start Claritin which I prescribed.  This is to help with the runny nose.  I also placed an urgent referral to your neurologist and they will call you to schedule a visit.  We will be in touch with your lab results

## 2022-06-27 DIAGNOSIS — R4189 Other symptoms and signs involving cognitive functions and awareness: Secondary | ICD-10-CM | POA: Insufficient documentation

## 2022-06-27 DIAGNOSIS — J3489 Other specified disorders of nose and nasal sinuses: Secondary | ICD-10-CM | POA: Insufficient documentation

## 2022-06-27 DIAGNOSIS — R441 Visual hallucinations: Secondary | ICD-10-CM | POA: Insufficient documentation

## 2022-06-27 DIAGNOSIS — R630 Anorexia: Secondary | ICD-10-CM | POA: Insufficient documentation

## 2022-06-27 DIAGNOSIS — F419 Anxiety disorder, unspecified: Secondary | ICD-10-CM | POA: Insufficient documentation

## 2022-06-27 NOTE — Assessment & Plan Note (Signed)
Continue sertraline 

## 2022-06-27 NOTE — Assessment & Plan Note (Signed)
Try Claritin. No other URI symptoms.

## 2022-06-27 NOTE — Assessment & Plan Note (Signed)
Continue levothyroxine 75 mcg daily. Check TSH and follow up

## 2022-06-27 NOTE — Assessment & Plan Note (Signed)
BP controlled. Continue losartan- HCTZ 50-12.5 mg daily.

## 2022-06-27 NOTE — Assessment & Plan Note (Signed)
This is new since seeing neurologist. Refer back to neuro.

## 2022-06-27 NOTE — Assessment & Plan Note (Signed)
Reports taking donepezil 10 mg daily. Brother is concerned that her memory and behavior is worsening. I will refer her back to neurology.

## 2022-07-04 ENCOUNTER — Ambulatory Visit: Payer: Self-pay | Admitting: Neurology

## 2022-07-04 ENCOUNTER — Encounter: Payer: Self-pay | Admitting: Neurology

## 2022-07-04 VITALS — BP 143/79 | HR 66 | Ht 63.0 in | Wt 185.8 lb

## 2022-07-04 DIAGNOSIS — F03C0 Unspecified dementia, severe, without behavioral disturbance, psychotic disturbance, mood disturbance, and anxiety: Secondary | ICD-10-CM

## 2022-07-04 DIAGNOSIS — R413 Other amnesia: Secondary | ICD-10-CM

## 2022-07-04 MED ORDER — DONEPEZIL HCL 10 MG PO TABS: 10.0000 mg | ORAL_TABLET | Freq: Every day | ORAL | 5 refills | Status: DC

## 2022-07-04 NOTE — Patient Instructions (Signed)
We will increase the donepezil to 10 mg daily.  Follow up in 3 months to see Amy, NP.

## 2022-07-04 NOTE — Progress Notes (Signed)
Subjective:    Patient ID: Debbie Brandt is a 61 y.o. female.  HPI     Interim history:   Debbie Brandt is a 61 year old right-handed woman with an underlying medical history of hypertension, hypothyroidism, reflux disease, hyperlipidemia, kidney stones, depression, and overweight state, who presents for follow up consultation of her memory loss. The patient is accompanied by her brother again today and presents after a longer gap of over 18 months. She missed an appointment in the interim with [name], NP on 05/22/21. I last saw her on 12/19/2020, at which time she reported feeling stable.  She had stopped taking her amlodipine for unclear reasons and unknown time.  We talked about her neuropsychological evaluation and recommendations.  She was advised to start low-dose donepezil 5 mg daily.    Today, 07/04/2022: She reports very little of her own history, she is unable to provide her history and history is provided by her brother.  She is able to answer a few questions but only with 1 or 2 word sentences.  Her brother reports that she has become worse over time, she is more forgetful, she sometimes talks to be able to passed on.  She has had visual and auditory hallucinations, some days are better than others.  She has a reasonable appetite, she has not been driving.  She no longer works.  She has not fallen.  He tries to push her water intake, she also likes to drink soda but he tries to limit it.  She has had no behavioral escalations, she has occasional left knee pain.    The patient's allergies, current medications, family history, past medical history, past social history, past surgical history and problem list were reviewed and updated as appropriate.    Previously:   I first met her on 04/04/2020, at which time her brother provided most of the history and reported that she had short-term memory issues for the past 6 to 12 months, including repeating herself and forgetfulness. Her MMSE was 22  out of 30 at the time. She had undergone recent work-up through her primary care, including MRI brain, which was benign and blood work; I suggested further evaluation with a sleep study as well as neuropsychological evaluation.  She was agreeable.     She had a home sleep test on 08/07/2020 which showed very mild/borderline obstructive sleep apnea with an AHI of 5.7/h, O2 nadir 93%.   She had evaluation through neuropsychology with Dr. Ilean Skill on 08/29/2020 and I reviewed results and recommendations, he had a feedback appointment with the patient on 10/12/2020. 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 Moderate deficits in processing speed and select executive abilities (i.e., verbal and non-verbal abstract reasoning, sequencing, perseverative responding) were also observed. Additionally, there is reported evidence that her cognitive deficits are interfering with her ability to manage complex tasks, such as managing finances and some work related tasks; she is currently a Scientist, water quality at [employer].  As such, diagnostic criteria for a major neurocognitive disorder are met.   The patient's cognitive profile is somewhat variable and suggests possibility of diffuse areas of involvement including both cortical and subcortical areas. It is possible that her cardiovascular risk factors and chronic underlying medical conditions are playing a role and possibly exacerbate cognitive dysfunction, particularly reductions in processing speed. Given historical report of "loud snoring"  by her deceased husband (per brothers report), the possibility of untreated obstructive sleep apnea or another related sleep disorder should be further explored due to the known impact on learning and memory.  Results from recent imaging do not  show signs of marked degeneration or atrophy often found in cortical dementias. However, an underlying neurodegenerative disease process such as Alzheimer's disease cannot be ruled out and still remains a likely etiology, given amnestic profile on certain memory tests, reported family history (e.g., sister died with early onset AD) , and other clinical features (Impairment in complex ADLs and difficulties with occupational performance). Fortunately, there is no evidence of recent depression or anxiety. There is also no evidence for visual hallucinations or delusions. Her cognitive functioning should be monitored through repeat neuropsychological assessment.  Final diagnostic impression will be rendered by Dr. Rexene Alberts at the time of follow-up, integrating the present neuropsychological findings with the results from neurological examination, neuroimaging, and laboratory studies.   Recommendations: Based on the findings of the present evaluation, the following recommendations are offered:   1. The patient may be an appropriate candidate for cholinesterase inhibitor therapy. She will follow up with Dr. Rexene Alberts about this. 2. It is recommended that she participate in a sleep study to assess for OSA.  3. The patient should continue to receive assistance with complex ADLs, including finances, cooking and transportation if needed.  4. The patient should limit or refrain from driving, as deficits noted on testing could affect one's ability to safely operate a motor vehicle.  At minimum, it is recommended that this person undergo a formal driving evaluation. Could contact [facility] at: [phone].   5. Her brother or a trusted friend/caregiver should monitor her medications to make sure they are being taken correctly.  6. The patient should continue to participate in activities which provide mental stimulation, safe cardiovascular exercise, and social interaction. 7. Neuropsychological  re-assessment in one year is recommended in order to monitor cognitive status, track progression of symptoms and further assist with treatment planning.  >>   (She) reports very little of her own history, she is minimally verbal and provides very little details, her history is primarily provided by her brother.  He has noticed short-term memory issues for the past 6 to 12 months including forgetfulness and a tendency to repeat herself.  He reports that they lost a sister about 2 months ago, older sister did have memory loss and she was in a nursing home as I understand.  The patient has no other siblings other than her brother.  She lost her son at the age of 17 about 4 years ago and her husband passed away a little over 4 years ago.  The patient works as a Production designer, theatre/television/film as a Scientist, water quality.  She denies having had any difficulty performing her job duties, when she has to do something that is difficult for her she asks for help and she states.  She drinks caffeine in the form of soda occasionally and decaf coffee, 1 cup/day and estimates that she drinks about 2 bottles of water per day.  She was told in the past by her husband that she snored, reports that she sleeps fairly well, does not have any night to night nocturia.  She drives and has not had any issues driving.  She denies any significant depression but her brother is wondering if she has residual depression.    I reviewed your office note from 12/24/2019.  You ordered a brain MRI.  She had a brain MRI without contrast on 12/26/2019 reviewed the results:   IMPRESSION: Normal for age noncontrast MRI appearance of the brain. No acute intracranial abnormality. She also had blood work through your office which I was able to review: She had CBC with differential, CMP, vitamin B12, blood work from 11/26/2019 showed normal folic acid, CMP showed benign findings, CBC with differential was benign, B12 1076 and TSH mildly elevated at 6.27.  She was advised to increase  her levothyroxine to 100 mcg at the time. Her brother brought her medications and she is taking 88 mcg of levothyroxine and he or she are not aware that she is supposed to take 100 mcg daily.  They are requested to call your office for clarification.    Her Past Medical History Is Significant For: Past Medical History:  Diagnosis Date   Chest pain 09/26/2015   Depression    Essential hypertension 09/26/2015   GERD (gastroesophageal reflux disease)    Hypertension    Hypothyroidism 09/26/2015   Murmur 09/26/2015   Thyroid disease     Her Past Surgical History Is Significant For: Past Surgical History:  Procedure Laterality Date   ABDOMINAL HYSTERECTOMY     CESAREAN SECTION     LAPAROSCOPIC ABDOMINAL EXPLORATION N/A 11/21/2017   Procedure: LAPAROSCOPIC ABDOMINAL EXPLORATION LYSIS OF ADHESIONS;  Surgeon: Stark Klein, MD;  Location: [facility];  Service: General;  Laterality: N/A;   LAPAROTOMY N/A 10/28/2016   Procedure: EXPLORATORY LAPAROTOMY WITH  LYSIS OF ADHESIONS;  Surgeon: Jackolyn Confer, MD;  Location: [facility];  Service: General;  Laterality: N/A;    Her Family History Is Significant For: Family History  Problem Relation Age of Onset   Cancer Mother        lung cancer   Hypertension Mother    Cancer Father    Heart attack Father    Mental illness Sister    Hypertension Sister    Hyperlipidemia Sister    Dementia Sister    Diabetes Brother    Hypertension Brother     Her Social History Is Significant For: Social History   Socioeconomic History   Marital status: Widowed    Spouse name: Not on file   Number of children: Not on file   Years of education: Not on file   Highest education level: Not on file  Occupational History   Not on file  Tobacco Use   Smoking status: Never   Smokeless tobacco: Never  Vaping Use   Vaping Use: Never used  Substance and Sexual Activity   Alcohol use: No    Alcohol/week: 0.0 standard drinks of alcohol   Drug use: No   Sexual  activity: Not Currently    Comment: 1st intercourse- 15, partners3, widow  Other Topics Concern   Not on file  Social History Narrative   Epworth Sleepiness Scale = 6 (as of 09/26/2015)   Social Determinants of Health   Financial Resource Strain: Not on file  Food Insecurity: Not on file  Transportation Needs: Not on file  Physical Activity: Not on file  Stress: Not on file  Social Connections: Not on file    Her Allergies Are:  Allergies  Allergen Reactions   Voltaren [Diclofenac Sodium] Other (See Comments)    Unknown, pt went to pick up at pharmacy and they said she was allergic  :   Her Current Medications Are:  Outpatient Encounter Medications as of 07/04/2022  Medication Sig   atorvastatin (LIPITOR) 20 MG tablet Take  1 tablet (20 mg total) by mouth daily.   diclofenac Sodium (VOLTAREN) 1 % GEL Apply 4 g topically 4 (four) times daily.   levothyroxine (SYNTHROID) 75 MCG tablet Take 1 tablet (75 mcg total) by mouth daily before breakfast.   loratadine (CLARITIN) 10 MG tablet Take 1 tablet (10 mg total) by mouth daily.   losartan-hydrochlorothiazide (HYZAAR) 50-12.5 MG tablet Take 1 tablet by mouth daily.   omeprazole (PRILOSEC) 20 MG capsule Take 1 capsule (20 mg total) by mouth daily.   sertraline (ZOLOFT) 100 MG tablet Take 1 tablet (100 mg total) by mouth daily.   [DISCONTINUED] donepezil (ARICEPT) 10 MG tablet TAKE 1/2 TABLET BY MOUTH AT BEDTIME   donepezil (ARICEPT) 10 MG tablet Take 1 tablet (10 mg total) by mouth at bedtime.   ondansetron (ZOFRAN ODT) 8 MG disintegrating tablet Take 1 tablet (8 mg total) by mouth every 8 (eight) hours as needed for nausea or vomiting. (Patient not taking: Reported on 07/04/2022)   TRAVATAN Z 0.004 % SOLN ophthalmic solution Place 1 drop into the right eye daily at 6 PM.    No facility-administered encounter medications on file as of 07/04/2022.  :  Review of Systems:  Out of a complete 14 point review of systems, all are reviewed  and negative with the exception of these symptoms as listed below:   Review of Systems  Neurological:        Pt here for worsening memory Brother states pt  is bringing different people into  their house . Brother states patient is having Hallucinations and asking about  deceased family members      Objective:  Neurological Exam  Physical Exam Physical Examination:   Vitals:   07/04/22 1527  BP: (!) 143/79  Pulse: 66    General Examination: The patient is a very pleasant 61 y.o. female in no acute distress. She appears well-developed and well groomed.  Intermittent giggling.  HEENT: Normocephalic, atraumatic, pupils are equal, round and reactive to light and accommodation.  Extraocular tracking is mildly impaired, corrective eyeglasses in place, face is symmetric with normal facial animation, hearing grossly intact, airway examination reveals mild to moderate mouth dryness, otherwise stable findings, tongue protrudes centrally and palate elevates symmetrically.    Chest: Clear to auscultation without wheezing, rhonchi or crackles noted.   Heart: S1+S2+0, regular and normal without murmurs, rubs or gallops noted.    Abdomen: Soft, non-tender and non-distended.   Extremities: There is no pitting edema in the distal lower extremities bilaterally.   Skin: Warm and dry without trophic changes noted.   Musculoskeletal: exam reveals no obvious joint deformities, reports left knee tenderness.     Neurologically:  Mental status: The patient is awake, pays attention but is unable to give her history and answer questions more than simple questions but unable to participate in MMSE testing today.  She is oriented to self, is able to give her date of birth correctly and eventually gives her brother's name correctly as Debbie Brandt (adding "knucklehead Mike"-which he endorses she used to call him).   Unable to give season, day of the week, month, date, year and date of birth of her brother.   On  04/04/2020: CDT: 1/4, AFT: 8/min.    04/04/2020   10:24 AM  MMSE - Mini Mental State Exam  Orientation to time 2  Orientation to Place 4  Registration 3  Attention/ Calculation 5  Recall 0  Language- name 2 objects 2  Language- repeat 1  Language-  follow 3 step command 3  Language- read & follow direction 1  Write a sentence 1  Copy design 0  Total score 22     Cranial nerves II - XII are as described above under HEENT exam. Motor exam: Normal bulk, strength and tone is noted. There is no obvious tremor.  Fine motor skills and coordination: intact grossly.  Cerebellar testing: No dysmetria or intention tremor. There is no truncal or gait ataxia.  Sensory exam: intact to light touch.  Gait, station and balance: She stands easily. No veering to one side is noted. No leaning to one side is noted. Posture is age-appropriate and stance is narrow based. Gait shows normal stride length and normal pace. No problems turning are noted.    Assessment and Plan:    In summary, Debbie Brandt is a very pleasant 61 year old female with an underlying medical history of hypertension, hypothyroidism, reflux disease, hyperlipidemia, kidney stones, depression, and overweight state, who presents for follow-up consultation of her memory loss of approximately 3 years duration, she presents after a longer gap of over 18 months.  She was started on low-dose donepezil in April 2022 and continues to take it once daily.  I would like to see if we can increase her donepezil to 10 mg once daily.  Her memory loss has become worse, she has advanced dementia without obvious behavioral escalations, has had intermittent hallucinations.  She is advised to follow-up in 3 months to see one of our nurse practitioners, we talked about the importance of maintaining a healthy lifestyle, fall prevention, good hydration, good nutrition.  She lives with her brother, she no longer drives, she no longer works.  She had evidence of  neurocognitive disorder during cognitive evaluation, brain MRI showed fairly age-appropriate findings in the past.  Her sister had memory loss.  Testing with a home sleep test in November 2021 indicated only borderline obstructive sleep apnea.   She is advised to follow-up routinely in this clinic to see one of our nurse practitioners for memory recheck in about 3 months, we may consider adding a second medication at the time.  Unfortunately, her dementia has advanced quite significantly.  She was unable to participate in memory testing today.  Her brother is very supportive.   I answered all his questions today and he was in agreement with our plan. I spent 30 minutes in total face-to-face time and in reviewing records during pre-charting, more than 50% of which was spent in counseling and coordination of care, reviewing test results, reviewing medications and treatment regimen and/or in discussing or reviewing the diagnosis of dementia, the prognosis and treatment options. Pertinent laboratory and imaging test results that were available during this visit with the patient were reviewed by me and considered in my medical decision making (see chart for details).

## 2022-09-02 ENCOUNTER — Other Ambulatory Visit: Payer: Self-pay | Admitting: Family Medicine

## 2022-10-01 ENCOUNTER — Other Ambulatory Visit: Payer: Self-pay | Admitting: Family Medicine

## 2022-10-02 NOTE — Patient Instructions (Signed)
Below is our plan:  We will continue donepezil 10mg  at bedtime. I will start memantine 5mg  at bedtime for 1 week then increase to 5mg  twice daily. Consider reaching out to a community center like [facility].   Please make sure you are staying well hydrated. I recommend 50-60 ounces daily. Well balanced diet and regular exercise encouraged. Consistent sleep schedule with 6-8 hours recommended.   Please continue follow up with care team as directed.   Follow up with me in 4 months   You may receive a survey regarding today's visit. I encourage you to leave honest feed back as I do use this information to improve patient care. Thank you for seeing me today!   Management of Memory Problems   There are some general things you can do to help manage your memory problems.  Your memory may not in fact recover, but by using techniques and strategies you will be able to manage your memory difficulties better.   1)  Establish a routine. Try to establish and then stick to a regular routine.  By doing this, you will get used to what to expect and you will reduce the need to rely on your memory.  Also, try to do things at the same time of day, such as taking your medication or checking your calendar first thing in the morning. Think about think that you can do as a part of a regular routine and make a list.  Then enter them into a daily planner to remind you.  This will help you establish a routine.   2)  Organize your environment. Organize your environment so that it is uncluttered.  Decrease visual stimulation.  Place everyday items such as keys or cell phone in the same place every day (ie.  Basket next to front door) Use post it notes with a brief message to yourself (ie. Turn off light, lock the door) Use labels to indicate where things go (ie. Which cupboards are for food, dishes, etc.) Keep a notepad and pen by the telephone to take messages   3)  Memory Aids A diary or journal/notebook/daily  planner Making a list (shopping list, chore list, to do list that needs to be done) Using an alarm as a reminder (kitchen timer or cell phone alarm) Using cell phone to store information (Notes, Calendar, Reminders) Calendar/White board placed in a prominent position Post-it notes   In order for memory aids to be useful, you need to have good habits.  It's no good remembering to make a note in your journal if you don't remember to look in it.  Try setting aside a certain time of day to look in journal.   4)  Improving mood and managing fatigue. There may be other factors that contribute to memory difficulties.  Factors, such as anxiety, depression and tiredness can affect memory. Regular gentle exercise can help improve your mood and give you more energy. Exercise: there are short videos created by the [employer] on Health specially for older adults: https://bit.ly/2I30q97.  Mediterranean diet: which emphasizes fruits, vegetables, whole grains, legumes, fish, and other seafood; unsaturated fats such as olive oils; and low amounts of red meat, eggs, and sweets. A variation of this, called MIND ([place] Intervention for Neurodegenerative Delay) incorporates the DASH (Dietary Approaches to Stop Hypertension) diet, which has been shown to lower high blood pressure, a risk factor for Alzheimer's disease. More information at: RepublicForum.gl.  Aerobic exercise that improve heart health is also good for the  mind.  [employer] on Aging have short videos for exercises that you can do at home: GoldCloset.com.ee Simple relaxation techniques may help relieve symptoms of anxiety Try to get back to completing activities or hobbies you enjoyed doing in the past. Learn to pace yourself through activities to decrease fatigue. Find out about some local support groups where you can share experiences with  others. Try and achieve 7-8 hours of sleep at night.   Resources for Family/Caregiver  Online caregiver support groups can be found at LDLive.be or call Alzheimer's Association's 24/7 hotline: (860)655-1028. [facility] offers in-person, virtual support groups and individual counseling for both care partners and persons with memory loss. Call for more information at [phone].   Advanced care plan: there are two types of Power of Attorney: healthcare and durable. Healthcare POA is a designated person to make healthcare decisions on your behalf if you were too sick to make them yourself. This person can be selected and documented by your physician. Durable POA has to be set up with a lawyer who takes charge of your finances and estate if you were too sick or cognitively impaired to manage your finances accurately. You can find a local Elder Engineer, mining here: ToyShower.it.  Check out www.planyourlifespan.org, which will help you plan before a crisis and decide who will take care of life considerations in a circumstance where you may not be able to speak for yourself.   Helpful books (available on [employer] or your local bookstore):  By Dr. Army Chaco: Keeping Love Alive as Memories Fade: The 5 Love Languages and the Alzheimer's Journey Jun 17, 2015 The Dementia Care Partner's Workbook: A Guide for Understanding, Education, and [employer] - February 14, 2018.  Both available for less than $15.   "Coping with behavior change in dementia: a family caregiver's guide" by [place] "A Caregiver's Guide to Dementia: Using Activities and Other Strategies to Prevent, Reduce and Manage Behavioral Symptoms" by Osie Bond Gitlin and [employer].  Arts development officer of Joy for the Person with Alzheimer's or Dementia" 4th edition by Vance Peper  Caregiver videos on common behaviors related to dementia:  quierodirigir.com  [place] [facility] Caregiver Portal: free to sign up, links to local resources: https://[place]-caregivers.com/login

## 2022-10-02 NOTE — Progress Notes (Signed)
Chief Complaint  Patient presents with   Room 1    Pt is here with her Brother. Pt's brother states that pt's memory is not good at all. Pt's brother states that pt is Debbie Brandt. Pt's brother states that she gets attitudes as part of her behavior.       HISTORY OF PRESENT ILLNESS:  10/03/22 ALL:  DAJANIQUE Brandt is a 63 y.o. female here today for follow up for dementia. She was last seen by Dr Frances Furbish 06/2022 and brother reported progressive worsening. Donepezil increased to 10mg  daily. She seems to be tolerating it well. Her brother reports that he has seen progressive decline. She is up all hours of the night. She seems to wonder all over the house. He has had to put up a baby gate to keep her form wondering. He has been giving her Benadryl at bedtime that has seemed to help. She prefers junk foods. No trouble swallowing. Hallucinations are about the same. Her cousin comes by on the weekends to help her get ready for church.   HISTORY (copied from Dr previous note)  [name] is a 62 year old right-handed woman with an underlying medical history of hypertension, hypothyroidism, reflux disease, hyperlipidemia, kidney stones, depression, and overweight state, who presents for follow up consultation of her memory loss. The patient is accompanied by her brother again today and presents after a longer gap of over 18 months. She missed an appointment in the interim with 62, NP on 05/22/21. I last saw her on 12/19/2020, at which time she reported feeling stable.  She had stopped taking her amlodipine for unclear reasons and unknown time.  We talked about her neuropsychological evaluation and recommendations.  She was advised to start low-dose donepezil 5 mg daily.     Today, 07/04/2022: She reports very little of her own history, she is unable to provide her history and history is provided by her brother.  She is able to answer a few questions but only with 1 or 2 word sentences.  Her  brother reports that she has become worse over time, she is more forgetful, she sometimes talks to be able to passed on.  She has had visual and auditory hallucinations, some days are better than others.  She has a reasonable appetite, she has not been driving.  She no longer works.  She has not fallen.  He tries to push her water intake, she also likes to drink soda but he tries to limit it.  She has had no behavioral escalations, she has occasional left knee pain.    REVIEW OF SYSTEMS: Out of a complete 14 system review of symptoms, the patient complains only of the following symptoms, memory loss and all other reviewed systems are negative.   ALLERGIES: Allergies  Allergen Reactions   Voltaren [Diclofenac Sodium] Other (See Comments)    Unknown, pt went to pick up at pharmacy and they said she was allergic     HOME MEDICATIONS: Outpatient Medications Prior to Visit  Medication Sig Dispense Refill   atorvastatin (LIPITOR) 20 MG tablet Take 1 tablet (20 mg total) by mouth daily. 90 tablet 1   diclofenac Sodium (VOLTAREN) 1 % GEL Apply 4 g topically 4 (four) times daily. 150 g 1   donepezil (ARICEPT) 10 MG tablet Take 1 tablet (10 mg total) by mouth at bedtime. 30 tablet 5   levothyroxine (SYNTHROID) 75 MCG tablet TAKE 1 TABLET BY MOUTH DAILY BEFORE BREAKFAST 30 tablet 3  loratadine (CLARITIN) 10 MG tablet Take 1 tablet (10 mg total) by mouth daily. 30 tablet 2   losartan-hydrochlorothiazide (HYZAAR) 50-12.5 MG tablet TAKE 1 TABLET BY MOUTH DAILY 30 tablet 3   omeprazole (PRILOSEC) 20 MG capsule Take 1 capsule (20 mg total) by mouth daily. 30 capsule 0   ondansetron (ZOFRAN ODT) 8 MG disintegrating tablet Take 1 tablet (8 mg total) by mouth every 8 (eight) hours as needed for nausea or vomiting. 20 tablet 0   sertraline (ZOLOFT) 100 MG tablet Take 1 tablet (100 mg total) by mouth daily. 90 tablet 1   TRAVATAN Z 0.004 % SOLN ophthalmic solution Place 1 drop into the right eye daily at 6 PM.       No facility-administered medications prior to visit.     PAST MEDICAL HISTORY: Past Medical History:  Diagnosis Date   Chest pain 09/26/2015   Depression    Essential hypertension 09/26/2015   GERD (gastroesophageal reflux disease)    Hypertension    Hypothyroidism 09/26/2015   Murmur 09/26/2015   Thyroid disease      PAST SURGICAL HISTORY: Past Surgical History:  Procedure Laterality Date   ABDOMINAL HYSTERECTOMY     CESAREAN SECTION     LAPAROSCOPIC ABDOMINAL EXPLORATION N/A 11/21/2017   Procedure: LAPAROSCOPIC ABDOMINAL EXPLORATION LYSIS OF ADHESIONS;  Surgeon: Stark Klein, MD;  Location: [facility];  Service: General;  Laterality: N/A;   LAPAROTOMY N/A 10/28/2016   Procedure: EXPLORATORY LAPAROTOMY WITH  LYSIS OF ADHESIONS;  Surgeon: Jackolyn Confer, MD;  Location: [facility];  Service: General;  Laterality: N/A;     FAMILY HISTORY: Family History  Problem Relation Age of Onset   Cancer Mother        lung cancer   Hypertension Mother    Cancer Father    Heart attack Father    Mental illness Sister    Hypertension Sister    Hyperlipidemia Sister    Dementia Sister    Diabetes Brother    Hypertension Brother      SOCIAL HISTORY: Social History   Socioeconomic History   Marital status: Widowed    Spouse name: Not on file   Number of children: Not on file   Years of education: Not on file   Highest education level: Not on file  Occupational History   Not on file  Tobacco Use   Smoking status: Never   Smokeless tobacco: Never  Vaping Use   Vaping Use: Never used  Substance and Sexual Activity   Alcohol use: No    Alcohol/week: 0.0 standard drinks of alcohol   Drug use: No   Sexual activity: Not Currently    Comment: 1st intercourse- 15, partners3, widow  Other Topics Concern   Not on file  Social History Narrative   Epworth Sleepiness Scale = 6 (as of 09/26/2015)   Social Determinants of Health   Financial Resource Strain: Not on file  Food  Insecurity: Not on file  Transportation Needs: Not on file  Physical Activity: Not on file  Stress: Not on file  Social Connections: Not on file  Intimate Partner Violence: Not on file     PHYSICAL EXAM  Vitals:   10/03/22 1512  BP: (!) 150/72  Pulse: 61  Weight: 166 lb 8 oz (75.5 kg)  Height: 5\' 7"  (1.702 m)   Body mass index is 26.08 kg/m.  Generalized: Well developed, in no acute distress  Cardiology: normal rate and rhythm, no murmur auscultated  Respiratory: clear to auscultation  bilaterally    Neurological examination  Mentation: Alert oriented to time, place, history taking. Follows all commands speech and language fluent Cranial nerve II-XII: Pupils were equal round reactive to light. Extraocular movements were full, visual field were full on confrontational test. Facial sensation and strength were normal. Uvula tongue midline. Head turning and shoulder shrug  were normal and symmetric. Motor: The motor testing reveals 5 over 5 strength of all 4 extremities. Good symmetric motor tone is noted throughout.  Sensory: Sensory testing is intact to soft touch on all 4 extremities. No evidence of extinction is noted.  Coordination: Cerebellar testing reveals good finger-nose-finger and heel-to-shin bilaterally.  Gait and station: Gait is normal. Tandem gait is normal. Romberg is negative. No drift is seen.  Reflexes: Deep tendon reflexes are symmetric and normal bilaterally.    DIAGNOSTIC DATA (LABS, IMAGING, TESTING) - I reviewed patient records, labs, notes, testing and imaging myself where available.  Lab Results  Component Value Date   WBC 4.6 06/26/2022   HGB 12.5 06/26/2022   HCT 37.8 06/26/2022   MCV 81.6 06/26/2022   PLT 343.0 06/26/2022      Component Value Date/Time   NA 139 06/26/2022 1537   K 3.9 06/26/2022 1537   CL 102 06/26/2022 1537   CO2 30 06/26/2022 1537   GLUCOSE 91 06/26/2022 1537   BUN 13 06/26/2022 1537   CREATININE 0.86 06/26/2022 1537    CREATININE 0.83 09/26/2015 0955   CALCIUM 9.6 06/26/2022 1537   PROT 7.4 06/26/2022 1537   ALBUMIN 4.1 06/26/2022 1537   AST 19 06/26/2022 1537   ALT 13 06/26/2022 1537   ALKPHOS 84 06/26/2022 1537   BILITOT 0.5 06/26/2022 1537   GFRNONAA >60 02/17/2021 1415   GFRAA >60 11/26/2017 0504   Lab Results  Component Value Date   CHOL 177 04/02/2022   HDL 53.70 04/02/2022   LDLCALC 113 (H) 04/02/2022   TRIG 55.0 04/02/2022   CHOLHDL 3 04/02/2022   Lab Results  Component Value Date   HGBA1C 6.1 06/26/2022   Lab Results  Component Value Date   VITAMINB12 271 01/15/2022   Lab Results  Component Value Date   TSH 4.33 06/26/2022       04/04/2020   10:24 AM  MMSE - Mini Mental State Exam  Orientation to time 2  Orientation to Place 4  Registration 3  Attention/ Calculation 5  Recall 0  Language- name 2 objects 2  Language- repeat 1  Language- follow 3 step command 3  Language- read & follow direction 1  Write a sentence 1  Copy design 0  Total score 22        No data to display               No data to display           ASSESSMENT AND PLAN  62 y.o. year old female  has a past medical history of Chest pain (09/26/2015), Depression, Essential hypertension (09/26/2015), GERD (gastroesophageal reflux disease), Hypertension, Hypothyroidism (09/26/2015), Murmur (09/26/2015), and Thyroid disease. here with    Severe dementia without behavioral disturbance, psychotic disturbance, mood disturbance, or anxiety, unspecified dementia type ([place])  Rae Halsted has continued to have progressive cognitive decline. She is tolerating donepezil with no worsening of hallucinations. We will continue 10mg  daily. I will start memantine 5mg  daily x 1 week then increase to 10mg  BID. Jeneen Rinks may continue to give her Benadryl 25mg  at bedtime if helpful as sleep aid. I  have discussed progressive nature of disease with her brother, [name]. Caregiver resources discussed. Consider  [facility]. Healthy lifestyle habits encouraged. She will follow up with PCP as directed. She will return to see me in 4 months, sooner if needed. She verbalizes understanding and agreement with this plan.   No orders of the defined types were placed in this encounter.    Meds ordered this encounter  Medications   memantine (NAMENDA) 5 MG tablet    Sig: Take 1 tablet (5 mg total) by mouth 2 (two) times daily.    Dispense:  60 tablet    Refill:  5    Order Specific Question:   Supervising Provider    Answer:   Anson Fret [id number]    I spent 30 minutes of face-to-face and non-face-to-face time with patient.  This included previsit chart review, lab review, study review, order entry, electronic health record documentation, patient education.   Shawnie Dapper, MSN, FNP-C 10/03/2022, 4:08 PM  [facility] [address], [address] [place], [place] [address] [phone]

## 2022-10-03 ENCOUNTER — Encounter: Payer: Self-pay | Admitting: Family Medicine

## 2022-10-03 ENCOUNTER — Ambulatory Visit (INDEPENDENT_AMBULATORY_CARE_PROVIDER_SITE_OTHER): Payer: Self-pay | Admitting: Family Medicine

## 2022-10-03 VITALS — BP 150/72 | HR 61 | Ht 67.0 in | Wt 166.5 lb

## 2022-10-03 DIAGNOSIS — F03C0 Unspecified dementia, severe, without behavioral disturbance, psychotic disturbance, mood disturbance, and anxiety: Secondary | ICD-10-CM

## 2022-10-03 MED ORDER — MEMANTINE HCL 5 MG PO TABS: 5.0000 mg | ORAL_TABLET | Freq: Two times a day (BID) | ORAL | 5 refills | Status: DC

## 2022-10-09 ENCOUNTER — Telehealth: Payer: Self-pay | Admitting: Family Medicine

## 2022-10-09 MED ORDER — MEMANTINE HCL 5 MG PO TABS: 5.0000 mg | ORAL_TABLET | Freq: Two times a day (BID) | ORAL | 5 refills | Status: DC

## 2022-10-09 NOTE — Telephone Encounter (Signed)
Called pt brother. Stated he need pt sent to [facility] [id number] - [place]. And not the [facility] on [address]. He is requesting a call back once it sent to right pharmacy.

## 2022-10-09 NOTE — Telephone Encounter (Signed)
Please call back. When I look at prescription, it was sent to Debbie Brandt for them on 10/03/22

## 2022-10-09 NOTE — Telephone Encounter (Signed)
Pt's brother, Bryson Ha (on [place])  memantine (NAMENDA) 5 MG tablet was sent to the wrong pharmacy. Need medication sent to [facility] [id number]

## 2022-10-18 ENCOUNTER — Other Ambulatory Visit: Payer: Self-pay | Admitting: Family Medicine

## 2022-10-18 DIAGNOSIS — E785 Hyperlipidemia, unspecified: Secondary | ICD-10-CM

## 2022-10-21 ENCOUNTER — Telehealth: Payer: Self-pay | Admitting: Family Medicine

## 2022-10-21 NOTE — Telephone Encounter (Signed)
Patient's brother, Juleen Starr, called about patient not eating. He would like to know if she needs to come in or if something can be prescribed to boost her appetite. Best callback number is [phone]

## 2022-10-21 NOTE — Telephone Encounter (Signed)
Would you like pt to schedule ov for lack of appetite?

## 2022-10-22 ENCOUNTER — Telehealth: Payer: Self-pay | Admitting: Family Medicine

## 2022-10-22 NOTE — Telephone Encounter (Signed)
LM on number provided to please contact neurology in regards to a safe appetite stimulant for pt as she has recently started a new medication with them. Provided office number for questions/concerns.

## 2022-10-22 NOTE — Telephone Encounter (Signed)
I called patient's brother, Jeneen Rinks, per [facility]. He reports that patient has a history of poor appetite.  However, for the past week or two her appetite has gotten even worse.  He reports that even before she started Namenda her appetite was poor.  She will drink water and coffee but will not eat any food.  He has attempted to give her food she likes including french fries and cookies but she will not eat those either.  He reach out to her PCP but was told to call us because it may be related to [place].  Patient's brother does not think it is related to the Namenda because it has been a problem even before starting Namenda.  It has worsened though within the past week or 2.  He stopped giving her all her medications yesterday because she has had no intake of food.  Per PCP's office: "She was recently seen by neurology and started on new medication for dementia.  Please ask her brother to reach out to her neurology office in regards to an appetite stimulant so they can advise a safe option. "

## 2022-10-22 NOTE — Telephone Encounter (Signed)
Pt's brother called stating that the pt is not eating and he states he was told to call the PCP to discuss.  When he called the PCP they said that since her neurologist changed her medications the neurologist is needing to take care of the loss of appetite. Please advise.

## 2022-10-22 NOTE — Telephone Encounter (Signed)
I spoke with Amy, NP.  Patient should stop Namenda for 2 weeks and let us know if appetite improves. Patient may need hospital if she still refuses to eat.  I called patient's brother, Jeneen Rinks, per [facility].  I discussed this with him.  He is agreeable to keeping her off of Namenda for 2 weeks to see if her appetite improves.  I advised him that if she still will not it she would need to go to hospital for nutrition.  Patient's brother will try to tempt her with food she likes to eat, such as ice cream. He will keep [place] updated on her appetite.

## 2022-11-19 ENCOUNTER — Ambulatory Visit: Payer: Self-pay | Admitting: Family Medicine

## 2022-11-26 ENCOUNTER — Ambulatory Visit (INDEPENDENT_AMBULATORY_CARE_PROVIDER_SITE_OTHER): Payer: Self-pay | Admitting: Family Medicine

## 2022-11-26 ENCOUNTER — Encounter: Payer: Self-pay | Admitting: Family Medicine

## 2022-11-26 VITALS — BP 126/70 | HR 63 | Temp 97.5°F | Ht 67.0 in | Wt 159.0 lb

## 2022-11-26 DIAGNOSIS — R630 Anorexia: Secondary | ICD-10-CM

## 2022-11-26 DIAGNOSIS — R634 Abnormal weight loss: Secondary | ICD-10-CM

## 2022-11-26 DIAGNOSIS — I10 Essential (primary) hypertension: Secondary | ICD-10-CM

## 2022-11-26 DIAGNOSIS — R7303 Prediabetes: Secondary | ICD-10-CM

## 2022-11-26 DIAGNOSIS — F03C0 Unspecified dementia, severe, without behavioral disturbance, psychotic disturbance, mood disturbance, and anxiety: Secondary | ICD-10-CM

## 2022-11-26 LAB — CBC WITH DIFFERENTIAL/PLATELET
Basophils Absolute: 0 10*3/uL (ref 0.0–0.1)
Basophils Relative: 0.4 % (ref 0.0–3.0)
Eosinophils Absolute: 0 10*3/uL (ref 0.0–0.7)
Eosinophils Relative: 0.6 % (ref 0.0–5.0)
HCT: 37.5 % (ref 36.0–46.0)
Hemoglobin: 12.3 g/dL (ref 12.0–15.0)
Lymphocytes Relative: 39.7 % (ref 12.0–46.0)
Lymphs Abs: 1.8 10*3/uL (ref 0.7–4.0)
MCHC: 32.9 g/dL (ref 30.0–36.0)
MCV: 81.2 fl (ref 78.0–100.0)
Monocytes Absolute: 0.3 10*3/uL (ref 0.1–1.0)
Monocytes Relative: 7.1 % (ref 3.0–12.0)
Neutro Abs: 2.3 10*3/uL (ref 1.4–7.7)
Neutrophils Relative %: 52.2 % (ref 43.0–77.0)
Platelets: 394 10*3/uL (ref 150.0–400.0)
RBC: 4.61 Mil/uL (ref 3.87–5.11)
RDW: 15.5 % (ref 11.5–15.5)
WBC: 4.5 10*3/uL (ref 4.0–10.5)

## 2022-11-26 LAB — BASIC METABOLIC PANEL
BUN: 8 mg/dL (ref 6–23)
CO2: 31 mEq/L (ref 19–32)
Calcium: 9.9 mg/dL (ref 8.4–10.5)
Chloride: 100 mEq/L (ref 96–112)
Creatinine, Ser: 0.81 mg/dL (ref 0.40–1.20)
GFR: 76.95 mL/min (ref >60.00)
Glucose, Bld: 90 mg/dL (ref 70–99)
Potassium: 3.1 mEq/L — ABNORMAL LOW (ref 3.5–5.1)
Sodium: 141 mEq/L (ref 135–145)

## 2022-11-26 LAB — VITAMIN B12: Vitamin B-12: >1500 pg/mL — ABNORMAL HIGH (ref 211–911)

## 2022-11-26 LAB — FOLATE: Folate: 8.4 ng/mL (ref >5.9)

## 2022-11-26 LAB — TSH: TSH: 5.21 u[IU]/mL (ref 0.35–5.50)

## 2022-11-26 NOTE — Patient Instructions (Signed)
Please go downstairs for lab before you leave.  Continue providing small bites of food throughout the day.  Try to include protein and vegetables when you can.  Finger foods will work better than meals.  Continue getting plenty of fluids including water throughout the day.  Add 1 Ensure daily since you have these at home.  You will hear from me regarding her lab results and recommendations for appetite improvement

## 2022-11-26 NOTE — Progress Notes (Signed)
Subjective:     Patient ID: Debbie Brandt, female    DOB: 01-31-1961, 62 y.o.   MRN: [id number]  Chief Complaint  Patient presents with   Medical Management of Chronic Issues    Concerns of not eating    HPI Patient is in today for poor appetite and not eating.   Under the care of neurology for severe dementia. Her brother is her caretaker and with her today.   Lost 7 lbs over the past 2 months and 26 lbs over the past   States he stopped giving all of her medications for 2 weeks to see if her appetite improved and started her back on the medications 2 weeks ago. Her appetite improved some while not taking her medications.   States she eats a decent breakfast but only eats a spoonful of food at lunch and at suppertime.   States she is cooperative with him except for bath time   Denies needing in home assistance or wanting to place her in a facility at this time. States he is managing ok   Health Maintenance Due  Topic Date Due   COVID-19 Vaccine (1) Never done   Hepatitis C Screening  Never done   DTaP/Tdap/Td (1 - Tdap) Never done   PAP SMEAR-Modifier  07/29/2022    Past Medical History:  Diagnosis Date   Chest pain 09/26/2015   Depression    Essential hypertension 09/26/2015   GERD (gastroesophageal reflux disease)    Hypertension    Hypothyroidism 09/26/2015   Murmur 09/26/2015   Thyroid disease     Past Surgical History:  Procedure Laterality Date   ABDOMINAL HYSTERECTOMY     CESAREAN SECTION     LAPAROSCOPIC ABDOMINAL EXPLORATION N/A 11/21/2017   Procedure: LAPAROSCOPIC ABDOMINAL EXPLORATION LYSIS OF ADHESIONS;  Surgeon: Stark Klein, MD;  Location: [facility];  Service: General;  Laterality: N/A;   LAPAROTOMY N/A 10/28/2016   Procedure: EXPLORATORY LAPAROTOMY WITH  LYSIS OF ADHESIONS;  Surgeon: Jackolyn Confer, MD;  Location: [facility];  Service: General;  Laterality: N/A;    Family History  Problem Relation Age of Onset   Cancer Mother        lung cancer    Hypertension Mother    Cancer Father    Heart attack Father    Mental illness Sister    Hypertension Sister    Hyperlipidemia Sister    Dementia Sister    Diabetes Brother    Hypertension Brother     Social History   Socioeconomic History   Marital status: Widowed    Spouse name: Not on file   Number of children: Not on file   Years of education: Not on file   Highest education level: Not on file  Occupational History   Not on file  Tobacco Use   Smoking status: Never   Smokeless tobacco: Never  Vaping Use   Vaping Use: Never used  Substance and Sexual Activity   Alcohol use: No    Alcohol/week: 0.0 standard drinks of alcohol   Drug use: No   Sexual activity: Not Currently    Comment: 1st intercourse- 15, partners3, widow  Other Topics Concern   Not on file  Social History Narrative   Epworth Sleepiness Scale = 6 (as of 09/26/2015)   Social Determinants of Health   Financial Resource Strain: Not on file  Food Insecurity: Not on file  Transportation Needs: Not on file  Physical Activity: Not on file  Stress: Not on  file  Social Connections: Not on file  Intimate Partner Violence: Not on file    Outpatient Medications Prior to Visit  Medication Sig Dispense Refill   atorvastatin (LIPITOR) 20 MG tablet Take 1 tablet (20 mg total) by mouth daily. Annual appt due in May must see provider for future refills 90 tablet 0   diclofenac Sodium (VOLTAREN) 1 % GEL Apply 4 g topically 4 (four) times daily. 150 g 1   donepezil (ARICEPT) 10 MG tablet Take 1 tablet (10 mg total) by mouth at bedtime. 30 tablet 5   levothyroxine (SYNTHROID) 75 MCG tablet TAKE 1 TABLET BY MOUTH DAILY BEFORE BREAKFAST 30 tablet 3   loratadine (CLARITIN) 10 MG tablet Take 1 tablet (10 mg total) by mouth daily. 30 tablet 2   losartan-hydrochlorothiazide (HYZAAR) 50-12.5 MG tablet TAKE 1 TABLET BY MOUTH DAILY 30 tablet 3   memantine (NAMENDA) 5 MG tablet Take 1 tablet (5 mg total) by mouth 2 (two)  times daily. 60 tablet 5   omeprazole (PRILOSEC) 20 MG capsule Take 1 capsule (20 mg total) by mouth daily. 30 capsule 0   ondansetron (ZOFRAN ODT) 8 MG disintegrating tablet Take 1 tablet (8 mg total) by mouth every 8 (eight) hours as needed for nausea or vomiting. 20 tablet 0   sertraline (ZOLOFT) 100 MG tablet Take 1 tablet (100 mg total) by mouth daily. 90 tablet 1   TRAVATAN Z 0.004 % SOLN ophthalmic solution Place 1 drop into the right eye daily at 6 PM.      No facility-administered medications prior to visit.    Allergies  Allergen Reactions   Voltaren [Diclofenac Sodium] Other (See Comments)    Unknown, pt went to pick up at pharmacy and they said she was allergic    ROS     Objective:    Physical Exam Constitutional:      General: She is not in acute distress.    Appearance: She is not ill-appearing.  HENT:     Mouth/Throat:     Mouth: Mucous membranes are moist.  Eyes:     Conjunctiva/sclera: Conjunctivae normal.  Cardiovascular:     Rate and Rhythm: Normal rate.  Pulmonary:     Effort: Pulmonary effort is normal.  Musculoskeletal:     Cervical back: Normal range of motion.  Skin:    General: Skin is warm and dry.  Neurological:     Mental Status: She is alert. Mental status is at baseline.     Motor: No weakness.     Gait: Gait normal.  Psychiatric:        Mood and Affect: Mood normal.        Behavior: Behavior is cooperative.     BP 126/70 (BP Location: Left Arm, Patient Position: Sitting, Cuff Size: Large)   Pulse 63   Temp (!) 97.5 F (36.4 C) (Temporal)   Ht '5\' 7"'$  (1.702 m)   Wt 159 lb (72.1 kg)   SpO2 99%   BMI 24.90 kg/m  Wt Readings from Last 3 Encounters:  11/26/22 159 lb (72.1 kg)  10/03/22 166 lb 8 oz (75.5 kg)  07/04/22 185 lb 12.8 oz (84.3 kg)       Assessment & Plan:   Problem List Items Addressed This Visit       Cardiovascular and Mediastinum   HTN (hypertension)   Relevant Orders   CBC with Differential/Platelet  (Completed)   Basic metabolic panel (Completed)     Other   Poor appetite  Relevant Orders   TSH (Completed)   Vitamin B12 (Completed)   Folate (Completed)   Other Visit Diagnoses     Recent weight loss    -  Primary   Relevant Orders   CBC with Differential/Platelet (Completed)   TSH (Completed)   Vitamin B12 (Completed)   Folate (Completed)   Basic metabolic panel (Completed)   Prediabetes       Relevant Orders   Basic metabolic panel (Completed)   Severe dementia without behavioral disturbance, psychotic disturbance, mood disturbance, or anxiety, unspecified dementia type [facility])          Reviewed notes from neurology visit. Her appetite and oral intake is worsening.  Encouraged finger foods and adding Ensure daily.  I will reach out to her neurologist to discuss appropriate appetite stimulant.  Her brother is caring for her. He denies needing in home assistant or wanting to place her in a facility at this time.  Check labs and follow up. She will see neurology as scheduled in 2 months.   I am having Rae Halsted maintain her Travatan Z, omeprazole, ondansetron, diclofenac Sodium, sertraline, loratadine, donepezil, losartan-hydrochlorothiazide, levothyroxine, memantine, and atorvastatin.  No orders of the defined types were placed in this encounter.

## 2022-11-27 ENCOUNTER — Other Ambulatory Visit: Payer: Self-pay | Admitting: Family Medicine

## 2022-11-27 DIAGNOSIS — E876 Hypokalemia: Secondary | ICD-10-CM

## 2022-11-27 MED ORDER — POTASSIUM CHLORIDE CRYS ER 20 MEQ PO TBCR: 20.0000 meq | EXTENDED_RELEASE_TABLET | Freq: Every day | ORAL | 0 refills | Status: DC

## 2022-11-27 NOTE — Progress Notes (Signed)
Her potassium is low. I will send in a potassium supplement for her to take daily for the next 5 days and then stop. Her labs are fine otherwise. I am still waiting to hear back from her neurologist about the appetite medication.

## 2022-11-28 ENCOUNTER — Telehealth: Payer: Self-pay

## 2022-11-28 ENCOUNTER — Other Ambulatory Visit: Payer: Self-pay | Admitting: Family Medicine

## 2022-11-28 DIAGNOSIS — F419 Anxiety disorder, unspecified: Secondary | ICD-10-CM

## 2022-11-28 DIAGNOSIS — R630 Anorexia: Secondary | ICD-10-CM

## 2022-11-28 MED ORDER — MIRTAZAPINE 15 MG PO TBDP: 15.0000 mg | ORAL_TABLET | Freq: Every day | ORAL | 0 refills | Status: DC

## 2022-11-28 NOTE — Telephone Encounter (Signed)
Called and spoke with pt's brother and was able to relay new medication instructions. He went over to her medications and made sure to remove the sertraline and stated he will go to pharmacy to get remeron. Will let neurology know how she is doing at next appt.

## 2022-11-28 NOTE — Telephone Encounter (Signed)
-----   Message from Girtha Rm, NP-C sent at 11/28/2022  1:04 PM EDT ----- Please call patient's brother and let him know that I am stopping her sertraline (Zoloft) and starting her on a new medication called Remeron for anxiety and to improve her appetite. It will take a couple of weeks for this to work well. Give the new medication at bedtime since it can be sedating.  Let the neurologist know how this is working for her at the next appointment.  ----- Message ----- From: [name], NP Sent: 11/27/2022   9:26 AM EDT To: Girtha Rm, NP-C  Good morning, Vickie!! Thank you so much for reaching out. I saw her back in January and she did seem to have had some decline in cognitive functioning. All of our providers do something a little different. Dr Rexene Alberts is more conservative and would probably recommend she see psychiatry and increase caloric intake. I had started mematine at our last visit 09/2022. Sometimes it can cause GI distress. I can always have them hold memantine if you feel weight loss/appetite changes have been acute. I don't use mirtazapine much but do think it seems to work very well in my dementia folks. Adding a low dose may be beneficial and would probably be my recommendation. ! I know she is on sertraline as well. I am seeing her back in May so I will follow closely.   Have a great day!  Amy  ----- Message ----- From: Girtha Rm, NP-C Sent: 11/26/2022  10:35 PM EDT To: [name], NP  Amy,  Mutual patient and caregiver (brother) were in today with concerns regarding worsening appetite and weight loss. I am ok to try an appetite stimulant but curious as to which one you would recommend. We could consider mirtazapine and Marinol or I welcome other suggestions.  Thanks,  Loletha Carrow, NP

## 2022-12-25 ENCOUNTER — Other Ambulatory Visit: Payer: Self-pay | Admitting: Family Medicine

## 2023-01-03 ENCOUNTER — Encounter: Payer: Self-pay | Admitting: Family Medicine

## 2023-01-03 ENCOUNTER — Ambulatory Visit (INDEPENDENT_AMBULATORY_CARE_PROVIDER_SITE_OTHER): Payer: Self-pay | Admitting: Family Medicine

## 2023-01-03 VITALS — BP 138/80 | HR 57 | Temp 97.8°F | Ht 67.0 in | Wt 163.0 lb

## 2023-01-03 DIAGNOSIS — I10 Essential (primary) hypertension: Secondary | ICD-10-CM

## 2023-01-03 DIAGNOSIS — E033 Postinfectious hypothyroidism: Secondary | ICD-10-CM

## 2023-01-03 DIAGNOSIS — R7303 Prediabetes: Secondary | ICD-10-CM

## 2023-01-03 DIAGNOSIS — E876 Hypokalemia: Secondary | ICD-10-CM

## 2023-01-03 DIAGNOSIS — F03C0 Unspecified dementia, severe, without behavioral disturbance, psychotic disturbance, mood disturbance, and anxiety: Secondary | ICD-10-CM

## 2023-01-03 DIAGNOSIS — R600 Localized edema: Secondary | ICD-10-CM

## 2023-01-03 LAB — COMPREHENSIVE METABOLIC PANEL
ALT: 16 U/L (ref 0–35)
AST: 19 U/L (ref 0–37)
Albumin: 4.2 g/dL (ref 3.5–5.2)
Alkaline Phosphatase: 91 U/L (ref 39–117)
BUN: 13 mg/dL (ref 6–23)
CO2: 31 mEq/L (ref 19–32)
Calcium: 9.8 mg/dL (ref 8.4–10.5)
Chloride: 101 mEq/L (ref 96–112)
Creatinine, Ser: 0.87 mg/dL (ref 0.40–1.20)
GFR: 70.58 mL/min (ref >60.00)
Glucose, Bld: 86 mg/dL (ref 70–99)
Potassium: 4 mEq/L (ref 3.5–5.1)
Sodium: 140 mEq/L (ref 135–145)
Total Bilirubin: 0.4 mg/dL (ref 0.2–1.2)
Total Protein: 7.5 g/dL (ref 6.0–8.3)

## 2023-01-03 LAB — CBC WITH DIFFERENTIAL/PLATELET
Basophils Absolute: 0 10*3/uL (ref 0.0–0.1)
Basophils Relative: 0.4 % (ref 0.0–3.0)
Eosinophils Absolute: 0.1 10*3/uL (ref 0.0–0.7)
Eosinophils Relative: 1.6 % (ref 0.0–5.0)
HCT: 37.1 % (ref 36.0–46.0)
Hemoglobin: 11.9 g/dL — ABNORMAL LOW (ref 12.0–15.0)
Lymphocytes Relative: 43.3 % (ref 12.0–46.0)
Lymphs Abs: 1.8 10*3/uL (ref 0.7–4.0)
MCHC: 32.1 g/dL (ref 30.0–36.0)
MCV: 83.3 fl (ref 78.0–100.0)
Monocytes Absolute: 0.3 10*3/uL (ref 0.1–1.0)
Monocytes Relative: 7.3 % (ref 3.0–12.0)
Neutro Abs: 1.9 10*3/uL (ref 1.4–7.7)
Neutrophils Relative %: 47.4 % (ref 43.0–77.0)
Platelets: 413 10*3/uL — ABNORMAL HIGH (ref 150.0–400.0)
RBC: 4.45 Mil/uL (ref 3.87–5.11)
RDW: 15.6 % — ABNORMAL HIGH (ref 11.5–15.5)
WBC: 4.1 10*3/uL (ref 4.0–10.5)

## 2023-01-03 LAB — HEMOGLOBIN A1C: Hgb A1c MFr Bld: 5.6 % (ref 4.6–6.5)

## 2023-01-03 LAB — TSH: TSH: 1.13 u[IU]/mL (ref 0.35–5.50)

## 2023-01-03 NOTE — Progress Notes (Signed)
Subjective:     Patient ID: Debbie Brandt, female    DOB: [birth year], 62 y.o.   MRN: [id number]  Chief Complaint  Patient presents with   Joint Swelling    Bilateral ankle swelling for a couple weeks    HPI Patient is in today with her brother, caretaker, with concerns of bilateral ankle edema. No calf or leg pain. States she does not have swelling today. The swelling resolves after elevating her legs.   She started mirtazapine at our last visit and she was supposed to stop sertraline. Her brother states she has been taking sertraline   Gained 4 lbs since our last visit, eating better. Sleeping well.   No chest pain, shortness of breath.   Hypokalemia at last visit.   Health Maintenance Due  Topic Date Due   Hepatitis C Screening  Never done   DTaP/Tdap/Td (1 - Tdap) Never done    Past Medical History:  Diagnosis Date   Chest pain 09/26/2015   Depression    Essential hypertension 09/26/2015   GERD (gastroesophageal reflux disease)    Hypertension    Hypothyroidism 09/26/2015   Murmur 09/26/2015   Thyroid disease     Past Surgical History:  Procedure Laterality Date   ABDOMINAL HYSTERECTOMY     CESAREAN SECTION     LAPAROSCOPIC ABDOMINAL EXPLORATION N/A 11/21/2017   Procedure: LAPAROSCOPIC ABDOMINAL EXPLORATION LYSIS OF ADHESIONS;  Surgeon: Almond Lint, MD;  Location: [facility];  Service: General;  Laterality: N/A;   LAPAROTOMY N/A 10/28/2016   Procedure: EXPLORATORY LAPAROTOMY WITH  LYSIS OF ADHESIONS;  Surgeon: Avel Peace, MD;  Location: [facility];  Service: General;  Laterality: N/A;    Family History  Problem Relation Age of Onset   Cancer Mother        lung cancer   Hypertension Mother    Cancer Father    Heart attack Father    Mental illness Sister    Hypertension Sister    Hyperlipidemia Sister    Dementia Sister    Diabetes Brother    Hypertension Brother     Social History   Socioeconomic History   Marital status: Widowed    Spouse name: Not  on file   Number of children: Not on file   Years of education: Not on file   Highest education level: Not on file  Occupational History   Not on file  Tobacco Use   Smoking status: Never   Smokeless tobacco: Never  Vaping Use   Vaping Use: Never used  Substance and Sexual Activity   Alcohol use: No    Alcohol/week: 0.0 standard drinks of alcohol   Drug use: No   Sexual activity: Not Currently    Comment: 1st intercourse- 15, partners3, widow  Other Topics Concern   Not on file  Social History Narrative   Epworth Sleepiness Scale = 6 (as of 09/26/2015)   Social Determinants of Health   Financial Resource Strain: Not on file  Food Insecurity: Not on file  Transportation Needs: Not on file  Physical Activity: Not on file  Stress: Not on file  Social Connections: Not on file  Intimate Partner Violence: Not on file    Outpatient Medications Prior to Visit  Medication Sig Dispense Refill   atorvastatin (LIPITOR) 20 MG tablet Take 1 tablet (20 mg total) by mouth daily. Annual appt due in May must see provider for future refills 90 tablet 0   CINNAMON PO Take 1,000 mg by mouth.  cyanocobalamin (VITAMIN B12) 1000 MCG tablet Take 1,000 mcg by mouth daily.     donepezil (ARICEPT) 10 MG tablet Take 1 tablet (10 mg total) by mouth at bedtime. 30 tablet 5   levothyroxine (SYNTHROID) 75 MCG tablet TAKE 1 TABLET BY MOUTH DAILY BEFORE BREAKFAST 30 tablet 3   losartan-hydrochlorothiazide (HYZAAR) 50-12.5 MG tablet TAKE 1 TABLET BY MOUTH DAILY 30 tablet 3   memantine (NAMENDA) 5 MG tablet Take 1 tablet (5 mg total) by mouth 2 (two) times daily. 60 tablet 5   mirtazapine (REMERON SOL-TAB) 15 MG disintegrating tablet Take 1 tablet (15 mg total) by mouth at bedtime. 90 tablet 0   sertraline (ZOLOFT) 100 MG tablet Take 100 mg by mouth daily.     diclofenac Sodium (VOLTAREN) 1 % GEL Apply 4 g topically 4 (four) times daily. (Patient not taking: Reported on 01/03/2023) 150 g 1   loratadine  (CLARITIN) 10 MG tablet Take 1 tablet (10 mg total) by mouth daily. (Patient not taking: Reported on 01/03/2023) 30 tablet 2   omeprazole (PRILOSEC) 20 MG capsule Take 1 capsule (20 mg total) by mouth daily. (Patient not taking: Reported on 01/03/2023) 30 capsule 0   ondansetron (ZOFRAN ODT) 8 MG disintegrating tablet Take 1 tablet (8 mg total) by mouth every 8 (eight) hours as needed for nausea or vomiting. (Patient not taking: Reported on 01/03/2023) 20 tablet 0   potassium chloride SA (KLOR-CON M) 20 MEQ tablet Take 1 tablet (20 mEq total) by mouth daily. (Patient not taking: Reported on 01/03/2023) 7 tablet 0   TRAVATAN Z 0.004 % SOLN ophthalmic solution Place 1 drop into the right eye daily at 6 PM.  (Patient not taking: Reported on 01/03/2023)     No facility-administered medications prior to visit.    Allergies  Allergen Reactions   Voltaren [Diclofenac Sodium] Other (See Comments)    Unknown, pt went to pick up at pharmacy and they said she was allergic    Review of Systems  Constitutional:  Negative for chills, fever, malaise/fatigue and weight loss.  Respiratory:  Negative for cough and shortness of breath.   Cardiovascular:  Positive for leg swelling. Negative for chest pain, palpitations and claudication.  Gastrointestinal:  Negative for abdominal pain, constipation, diarrhea, nausea and vomiting.  Genitourinary:  Negative for dysuria, frequency and urgency.  Neurological:  Negative for dizziness and focal weakness.       Objective:    Physical Exam Constitutional:      General: She is not in acute distress.    Appearance: She is not ill-appearing.  HENT:     Mouth/Throat:     Mouth: Mucous membranes are moist.  Eyes:     Extraocular Movements: Extraocular movements intact.     Conjunctiva/sclera: Conjunctivae normal.  Cardiovascular:     Rate and Rhythm: Normal rate and regular rhythm.  Pulmonary:     Effort: Pulmonary effort is normal.     Breath sounds: Normal  breath sounds.  Musculoskeletal:     Cervical back: Normal range of motion and neck supple.     Right lower leg: No edema.     Left lower leg: No edema.  Skin:    General: Skin is warm and dry.     Capillary Refill: Capillary refill takes less than 2 seconds.     Findings: No rash.  Neurological:     General: No focal deficit present.     Mental Status: She is alert. Mental status is at baseline.  Motor: No weakness.     Gait: Gait normal.  Psychiatric:        Behavior: Behavior normal.     BP 138/80 (BP Location: Left Arm, Patient Position: Sitting, Cuff Size: Large)   Pulse (!) 57   Temp 97.8 F (36.6 C) (Temporal)   Ht  (1.702 m)   Wt 163 lb (73.9 kg)   SpO2 98%   BMI 25.53 kg/m  Wt Readings from Last 3 Encounters:  01/03/23 163 lb (73.9 kg)  11/26/22 159 lb (72.1 kg)  10/03/22 166 lb 8 oz (75.5 kg)       Assessment & Plan:   Problem List Items Addressed This Visit       Cardiovascular and Mediastinum   HTN (hypertension)   Relevant Orders   CBC with Differential/Platelet   Comprehensive metabolic panel     Endocrine   Hypothyroidism (Chronic)   Relevant Orders   TSH     Other   Hypokalemia   Relevant Orders   Comprehensive metabolic panel   Other Visit Diagnoses     Bilateral leg edema    -  Primary   Relevant Orders   CBC with Differential/Platelet   Comprehensive metabolic panel   TSH   Prediabetes       Relevant Orders   Hemoglobin A1c   Severe dementia without behavioral disturbance, psychotic disturbance, mood disturbance, or anxiety, unspecified dementia type          Her brother is with her today, he is her caregiver.  Fortunately she has gained some weight since starting mirtazapine and her appetite has improved. Reviewed medications and she will continue on mirtazapine and stop sertraline. Discussed dependent edema.  No edema on exam today. Recommend elevating her legs when she is sitting, walking every hour, eating a  low-sodium diet and compression stockings if she is willing to wear these. DASH diet handout provided. Check labs due to recent hypokalemia, edema and history of prediabetes. Check TSH and adjust levothyroxine dose as needed. Follow-up in 3 months for fasting CPE  I have discontinued Glender L. Hollingworth's Travatan Z and sertraline. I am also having her maintain her omeprazole, ondansetron, diclofenac Sodium, loratadine, donepezil, losartan-hydrochlorothiazide, levothyroxine, memantine, atorvastatin, potassium chloride SA, mirtazapine, cyanocobalamin, and CINNAMON PO.  No orders of the defined types were placed in this encounter.

## 2023-01-03 NOTE — Progress Notes (Signed)
Her labs are fine. Nothing concerning.

## 2023-01-03 NOTE — Patient Instructions (Signed)
Please go downstairs for labs.   Elevate your legs when you are sitting.   Do not add salt to your food. See the DASH eating plan.   Try wearing compression stockings/socks.   Follow up if the swelling does not resolve after sleeping at night or elevating the legs.   Follow up if one leg is much larger than the other.   I will see you back in 3 months or sooner if needed.      Eat a low sodium diet. DASH Eating Plan DASH stands for Dietary Approaches to Stop Hypertension. The DASH eating plan is a healthy eating plan that has been shown to: Reduce high blood pressure (hypertension). Reduce your risk for type 2 diabetes, heart disease, and stroke. Help with weight loss. What are tips for following this plan? Reading food labels Check food labels for the amount of salt (sodium) per serving. Choose foods with less than 5 percent of the Daily Value of sodium. Generally, foods with less than 300 milligrams (mg) of sodium per serving fit into this eating plan. To find whole grains, look for the word "whole" as the first word in the ingredient list. Shopping Buy products labeled as "low-sodium" or "no salt added." Buy fresh foods. Avoid canned foods and pre-made or frozen meals. Cooking Avoid adding salt when cooking. Use salt-free seasonings or herbs instead of table salt or sea salt. Check with your health care provider or pharmacist before using salt substitutes. Do not fry foods. Cook foods using healthy methods such as baking, boiling, grilling, roasting, and broiling instead. Cook with heart-healthy oils, such as olive, canola, avocado, soybean, or sunflower oil. Meal planning  Eat a balanced diet that includes: 4 or more servings of fruits and 4 or more servings of vegetables each day. Try to fill one-half of your plate with fruits and vegetables. 6-8 servings of whole grains each day. Less than 6 oz (170 g) of lean meat, poultry, or fish each day. A 3-oz (85-g) serving of meat  is about the same size as a deck of cards. One egg equals 1 oz (28 g). 2-3 servings of low-fat dairy each day. One serving is 1 cup (237 mL). 1 serving of nuts, seeds, or beans 5 times each week. 2-3 servings of heart-healthy fats. Healthy fats called omega-3 fatty acids are found in foods such as walnuts, flaxseeds, fortified milks, and eggs. These fats are also found in cold-water fish, such as sardines, salmon, and mackerel. Limit how much you eat of: Canned or prepackaged foods. Food that is high in trans fat, such as some fried foods. Food that is high in saturated fat, such as fatty meat. Desserts and other sweets, sugary drinks, and other foods with added sugar. Full-fat dairy products. Do not salt foods before eating. Do not eat more than 4 egg yolks a week. Try to eat at least 2 vegetarian meals a week. Eat more home-cooked food and less restaurant, buffet, and fast food. Lifestyle When eating at a restaurant, ask that your food be prepared with less salt or no salt, if possible. If you drink alcohol: Limit how much you use to: 0-1 drink a day for women who are not pregnant. 0-2 drinks a day for men. Be aware of how much alcohol is in your drink. In the [place] one drink equals one 12 oz bottle of beer (355 mL), one 5 oz glass of wine (148 mL), or one 1 oz glass of hard liquor (44 mL). General  information Avoid eating more than 2,300 mg of salt a day. If you have hypertension, you may need to reduce your sodium intake to 1,500 mg a day. Work with your health care provider to maintain a healthy body weight or to lose weight. Ask what an ideal weight is for you. Get at least 30 minutes of exercise that causes your heart to beat faster (aerobic exercise) most days of the week. Activities may include walking, swimming, or biking. Work with your health care provider or dietitian to adjust your eating plan to your individual calorie needs. What foods should I eat? Fruits All fresh,  dried, or frozen fruit. Canned fruit in natural juice (without added sugar). Vegetables Fresh or frozen vegetables (raw, steamed, roasted, or grilled). Low-sodium or reduced-sodium tomato and vegetable juice. Low-sodium or reduced-sodium tomato sauce and tomato paste. Low-sodium or reduced-sodium canned vegetables. Grains Whole-grain or whole-wheat bread. Whole-grain or whole-wheat pasta. Brown rice. Orpah Cobb. Bulgur. Whole-grain and low-sodium cereals. Pita bread. Low-fat, low-sodium crackers. Whole-wheat flour tortillas. Meats and other proteins Skinless chicken or [place]. Ground chicken or [place]. Pork with fat trimmed off. Fish and seafood. Egg whites. Dried beans, peas, or lentils. Unsalted nuts, nut butters, and seeds. Unsalted canned beans. Lean cuts of beef with fat trimmed off. Low-sodium, lean precooked or cured meat, such as sausages or meat loaves. Dairy Low-fat (1%) or fat-free (skim) milk. Reduced-fat, low-fat, or fat-free cheeses. Nonfat, low-sodium ricotta or cottage cheese. Low-fat or nonfat yogurt. Low-fat, low-sodium cheese. Fats and oils Soft margarine without trans fats. Vegetable oil. Reduced-fat, low-fat, or light mayonnaise and salad dressings (reduced-sodium). Canola, safflower, olive, avocado, soybean, and sunflower oils. Avocado. Seasonings and condiments Herbs. Spices. Seasoning mixes without salt. Other foods Unsalted popcorn and pretzels. Fat-free sweets. The items listed above may not be a complete list of foods and beverages you can eat. Contact a dietitian for more information. What foods should I avoid? Fruits Canned fruit in a light or heavy syrup. Fried fruit. Fruit in cream or butter sauce. Vegetables Creamed or fried vegetables. Vegetables in a cheese sauce. Regular canned vegetables (not low-sodium or reduced-sodium). Regular canned tomato sauce and paste (not low-sodium or reduced-sodium). Regular tomato and vegetable juice (not low-sodium or  reduced-sodium). Rosita Fire. Olives. Grains Baked goods made with fat, such as croissants, muffins, or some breads. Dry pasta or rice meal packs. Meats and other proteins Fatty cuts of meat. Ribs. Fried meat. Tomasa Blase. Bologna, salami, and other precooked or cured meats, such as sausages or meat loaves. Fat from the back of a pig (fatback). Bratwurst. Salted nuts and seeds. Canned beans with added salt. Canned or smoked fish. Whole eggs or egg yolks. Chicken or [place] with skin. Dairy Whole or 2% milk, cream, and half-and-half. Whole or full-fat cream cheese. Whole-fat or sweetened yogurt. Full-fat cheese. Nondairy creamers. Whipped toppings. Processed cheese and cheese spreads. Fats and oils Butter. Stick margarine. Lard. Shortening. Ghee. Bacon fat. Tropical oils, such as coconut, palm kernel, or palm oil. Seasonings and condiments Onion salt, garlic salt, seasoned salt, table salt, and sea salt. Worcestershire sauce. Tartar sauce. Barbecue sauce. Teriyaki sauce. Soy sauce, including reduced-sodium. Steak sauce. Canned and packaged gravies. Fish sauce. Oyster sauce. Cocktail sauce. Store-bought horseradish. Ketchup. Mustard. Meat flavorings and tenderizers. Bouillon cubes. Hot sauces. Pre-made or packaged marinades. Pre-made or packaged taco seasonings. Relishes. Regular salad dressings. Other foods Salted popcorn and pretzels. The items listed above may not be a complete list of foods and beverages you should avoid. Contact a dietitian  for more information. Where to find more information National Heart, Lung, and Blood Institute: PopSteam.is American Heart Association: www.heart.org Academy of Nutrition and Dietetics: www.eatright.org National Kidney Foundation: www.kidney.org Summary The DASH eating plan is a healthy eating plan that has been shown to reduce high blood pressure (hypertension). It may also reduce your risk for type 2 diabetes, heart disease, and stroke. When on the DASH  eating plan, aim to eat more fresh fruits and vegetables, whole grains, lean proteins, low-fat dairy, and heart-healthy fats. With the DASH eating plan, you should limit salt (sodium) intake to 2,300 mg a day. If you have hypertension, you may need to reduce your sodium intake to 1,500 mg a day. Work with your health care provider or dietitian to adjust your eating plan to your individual calorie needs. This information is not intended to replace advice given to you by your health care provider. Make sure you discuss any questions you have with your health care provider. Document Revised: 08/06/2019 Document Reviewed: 08/06/2019 Elsevier Patient Education  2023 [employer].

## 2023-01-23 ENCOUNTER — Other Ambulatory Visit: Payer: Self-pay | Admitting: Family Medicine

## 2023-01-23 DIAGNOSIS — E785 Hyperlipidemia, unspecified: Secondary | ICD-10-CM

## 2023-02-05 NOTE — Progress Notes (Signed)
Chief Complaint  Patient presents with   Follow-up    RM 1 with brother, [name]. Last seen 10/03/22. No new sx, stable.Today's MMSE 5/30.     HISTORY OF PRESENT ILLNESS:  02/06/23 ALL:  Debbie Brandt returns for follow up for dementia. She was last seen 09/2022. We continued donepezil and added memantine. She has tolerated meds well. No significant changes. She continues to have progressive memory loss but seems fairly stable from last visit. She continues to wonder at night. [name] isn't overly bothered as she seems to stay in her rooom but when her cousin picks her up on the weekends, she keeps her up all night. They have tried sleep aids OTC that are not effective. She seems ot be eating normally. She likes hot dogs and chicken. Likes to eat sweets. Hallucinations are stable. She talks to people who aren't there. Mood seems stable. She does get sad from time to time and will cry. She continues mirtazapine through PCP.   10/03/2022 ALL: Debbie Brandt is a 62 y.o. female here today for follow up for dementia. She was last seen by Dr Frances Furbish 06/2022 and brother reported progressive worsening. Donepezil increased to 10mg  daily. She seems to be tolerating it well. Her brother reports that he has seen progressive decline. She is up all hours of the night. She seems to wonder all over the house. He has had to put up a baby gate to keep her form wondering. He has been giving her Benadryl at bedtime that has seemed to help. She prefers junk foods. No trouble swallowing. Hallucinations are about the same. Her cousin comes by on the weekends to help her get ready for church.   HISTORY (copied from Dr Teofilo Pod previous note)  Debbie Brandt is a 61 year old right-handed woman with an underlying medical history of hypertension, hypothyroidism, reflux disease, hyperlipidemia, kidney stones, depression, and overweight state, who presents for follow up consultation of her memory loss. The patient is accompanied by her  brother again today and presents after a longer gap of over 18 months. She missed an appointment in the interim with Shawnie Dapper, NP on 05/22/21. I last saw her on 12/19/2020, at which time she reported feeling stable.  She had stopped taking her amlodipine for unclear reasons and unknown time.  We talked about her neuropsychological evaluation and recommendations.  She was advised to start low-dose donepezil 5 mg daily.     Today, 07/04/2022: She reports very little of her own history, she is unable to provide her history and history is provided by her brother.  She is able to answer a few questions but only with 1 or 2 word sentences.  Her brother reports that she has become worse over time, she is more forgetful, she sometimes talks to be able to passed on.  She has had visual and auditory hallucinations, some days are better than others.  She has a reasonable appetite, she has not been driving.  She no longer works.  She has not fallen.  He tries to push her water intake, she also likes to drink soda but he tries to limit it.  She has had no behavioral escalations, she has occasional left knee pain.    REVIEW OF SYSTEMS: Out of a complete 14 system review of symptoms, the patient complains only of the following symptoms, memory loss, hallucinations and all other reviewed systems are negative.   ALLERGIES: Allergies  Allergen Reactions   Voltaren [Diclofenac Sodium] Other (See Comments)  Unknown, pt went to pick up at pharmacy and they said she was allergic     HOME MEDICATIONS: Outpatient Medications Prior to Visit  Medication Sig Dispense Refill   atorvastatin (LIPITOR) 20 MG tablet TAKE 1 TABLET BY MOUTH DAILY, PLEASE SCHEDULE ANNUAL APPOINTMENT WITH PROVIDER FOR FUTURE REFILLS 90 tablet 0   CINNAMON PO Take 1,000 mg by mouth.     cyanocobalamin (VITAMIN B12) 1000 MCG tablet Take 1,000 mcg by mouth daily.     diclofenac Sodium (VOLTAREN) 1 % GEL Apply 4 g topically 4 (four) times daily. 150 g  1   levothyroxine (SYNTHROID) 75 MCG tablet TAKE 1 TABLET BY MOUTH DAILY BEFORE BREAKFAST 30 tablet 3   loratadine (CLARITIN) 10 MG tablet Take 1 tablet (10 mg total) by mouth daily. 30 tablet 2   losartan-hydrochlorothiazide (HYZAAR) 50-12.5 MG tablet TAKE 1 TABLET BY MOUTH DAILY 30 tablet 3   mirtazapine (REMERON SOL-TAB) 15 MG disintegrating tablet Take 1 tablet (15 mg total) by mouth at bedtime. 90 tablet 0   omeprazole (PRILOSEC) 20 MG capsule Take 1 capsule (20 mg total) by mouth daily. 30 capsule 0   ondansetron (ZOFRAN ODT) 8 MG disintegrating tablet Take 1 tablet (8 mg total) by mouth every 8 (eight) hours as needed for nausea or vomiting. 20 tablet 0   potassium chloride SA (KLOR-CON M) 20 MEQ tablet Take 1 tablet (20 mEq total) by mouth daily. 7 tablet 0   donepezil (ARICEPT) 10 MG tablet Take 1 tablet (10 mg total) by mouth at bedtime. 30 tablet 5   memantine (NAMENDA) 5 MG tablet Take 1 tablet (5 mg total) by mouth 2 (two) times daily. 60 tablet 5   No facility-administered medications prior to visit.     PAST MEDICAL HISTORY: Past Medical History:  Diagnosis Date   Chest pain 09/26/2015   Depression    Essential hypertension 09/26/2015   GERD (gastroesophageal reflux disease)    Hypertension    Hypothyroidism 09/26/2015   Murmur 09/26/2015   Thyroid disease      PAST SURGICAL HISTORY: Past Surgical History:  Procedure Laterality Date   ABDOMINAL HYSTERECTOMY     CESAREAN SECTION     LAPAROSCOPIC ABDOMINAL EXPLORATION N/A 11/21/2017   Procedure: LAPAROSCOPIC ABDOMINAL EXPLORATION LYSIS OF ADHESIONS;  Surgeon: Almond Lint, MD;  Location: [facility];  Service: General;  Laterality: N/A;   LAPAROTOMY N/A 10/28/2016   Procedure: EXPLORATORY LAPAROTOMY WITH  LYSIS OF ADHESIONS;  Surgeon: Avel Peace, MD;  Location: [facility];  Service: General;  Laterality: N/A;     FAMILY HISTORY: Family History  Problem Relation Age of Onset   Cancer Mother        lung cancer    Hypertension Mother    Cancer Father    Heart attack Father    Mental illness Sister    Hypertension Sister    Hyperlipidemia Sister    Dementia Sister    Diabetes Brother    Hypertension Brother      SOCIAL HISTORY: Social History   Socioeconomic History   Marital status: Widowed    Spouse name: Not on file   Number of children: Not on file   Years of education: Not on file   Highest education level: Not on file  Occupational History   Not on file  Tobacco Use   Smoking status: Never   Smokeless tobacco: Never  Vaping Use   Vaping Use: Never used  Substance and Sexual Activity   Alcohol use: No  Alcohol/week: 0.0 standard drinks of alcohol   Drug use: No   Sexual activity: Not Currently    Comment: 1st intercourse- 15, partners3, widow  Other Topics Concern   Not on file  Social History Narrative   Epworth Sleepiness Scale = 6 (as of 09/26/2015)   Social Determinants of Health   Financial Resource Strain: Not on file  Food Insecurity: Not on file  Transportation Needs: Not on file  Physical Activity: Not on file  Stress: Not on file  Social Connections: Not on file  Intimate Partner Violence: Not on file     PHYSICAL EXAM  Vitals:   02/06/23 1511  BP: (!) 131/57  Pulse: 68  Weight: 166 lb (75.3 kg)  Height: 5\' 7"  (1.702 m)    Body mass index is 26 kg/m.  Generalized: Well developed, in no acute distress  Cardiology: normal rate and rhythm, no murmur auscultated  Respiratory: clear to auscultation bilaterally    Neurological examination  Mentation: Alert, not oriented to time, place, or history taking. Follows most commands speech and language fluent Cranial nerve II-XII: Pupils were equal round reactive to light. Extraocular movements were full, visual field were full on confrontational test. Facial sensation and strength were normal. Uvula tongue midline. Head turning and shoulder shrug  were normal and symmetric. Motor: The motor testing  reveals 5 over 5 strength of all 4 extremities. Good symmetric motor tone is noted throughout.  Gait and station: Gait is normal.    DIAGNOSTIC DATA (LABS, IMAGING, TESTING) - I reviewed patient records, labs, notes, testing and imaging myself where available.  Lab Results  Component Value Date   WBC 4.1 01/03/2023   HGB 11.9 (L) 01/03/2023   HCT 37.1 01/03/2023   MCV 83.3 01/03/2023   PLT 413.0 (H) 01/03/2023      Component Value Date/Time   NA 140 01/03/2023 1330   K 4.0 01/03/2023 1330   CL 101 01/03/2023 1330   CO2 31 01/03/2023 1330   GLUCOSE 86 01/03/2023 1330   BUN 13 01/03/2023 1330   CREATININE 0.87 01/03/2023 1330   CREATININE 0.83 09/26/2015 0955   CALCIUM 9.8 01/03/2023 1330   PROT 7.5 01/03/2023 1330   ALBUMIN 4.2 01/03/2023 1330   AST 19 01/03/2023 1330   ALT 16 01/03/2023 1330   ALKPHOS 91 01/03/2023 1330   BILITOT 0.4 01/03/2023 1330   GFRNONAA >60 02/17/2021 1415   GFRAA >60 11/26/2017 0504   Lab Results  Component Value Date   CHOL 177 04/02/2022   HDL 53.70 04/02/2022   LDLCALC 113 (H) 04/02/2022   TRIG 55.0 04/02/2022   CHOLHDL 3 04/02/2022   Lab Results  Component Value Date   HGBA1C 5.6 01/03/2023   Lab Results  Component Value Date   VITAMINB12 >1500 (H) 11/26/2022   Lab Results  Component Value Date   TSH 1.13 01/03/2023       02/06/2023    3:18 PM 04/04/2020   10:24 AM  MMSE - Mini Mental State Exam  Orientation to time 0 2  Orientation to Place 2 4  Registration 0 3  Attention/ Calculation 0 5  Recall 0 0  Language- name 2 objects 2 2  Language- repeat 1 1  Language- follow 3 step command 0 3  Language- read & follow direction 0 1  Write a sentence 0 1  Copy design 0 0  Total score 5 22        No data to display  No data to display           ASSESSMENT AND PLAN  62 y.o. year old female  has a past medical history of Chest pain (09/26/2015), Depression, Essential hypertension (09/26/2015),  GERD (gastroesophageal reflux disease), Hypertension, Hypothyroidism (09/26/2015), Murmur (09/26/2015), and Thyroid disease. here with    Severe dementia without behavioral disturbance, psychotic disturbance, mood disturbance, or anxiety, unspecified dementia type (HCC) - Plan: donepezil (ARICEPT) 10 MG tablet  Debbie Brandt has continued to have progressive cognitive decline. She is tolerating donepezil with no worsening of hallucinations. We will continue 10mg  daily. I will increase memantine to 10mg  BID. I will prescribe hydroxyzine 10mg  QHS PRN to see if this may help with insomnia. Possible side effects reviewed. Hydralazine prescribed by mistake but I have talked with pharmacist at [facility] who cancelled order. I have educated [name] on correct medication to have filled. I have discussed progressive nature of disease with her brother, [name]. Caregiver resources discussed. Consider [facility]. Healthy lifestyle habits encouraged. She will follow up with PCP as directed. She will return to see me in 6 months, sooner if needed. She verbalizes understanding and agreement with this plan.   No orders of the defined types were placed in this encounter.    Meds ordered this encounter  Medications   donepezil (ARICEPT) 10 MG tablet    Sig: Take 1 tablet (10 mg total) by mouth at bedtime.    Dispense:  90 tablet    Refill:  3    Order Specific Question:   Supervising Provider    Answer:   Anson Fret [[id number]]   memantine (NAMENDA) 10 MG tablet    Sig: Take 1 tablet (10 mg total) by mouth 2 (two) times daily.    Dispense:  180 tablet    Refill:  3    Order Specific Question:   Supervising Provider    Answer:   Anson Fret [[id number]]   DISCONTD: hydrALAZINE (APRESOLINE) 10 MG tablet    Sig: Take 1 tablet (10 mg total) by mouth at bedtime as needed.    Dispense:  90 tablet    Refill:  1    Order Specific Question:   Supervising Provider    Answer:   Anson Fret [[id number]]    hydrOXYzine (ATARAX) 10 MG tablet    Sig: Take 1 tablet (10 mg total) by mouth at bedtime as needed.    Dispense:  90 tablet    Refill:  1    Order Specific Question:   Supervising Provider    Answer:   Anson Fret [id number]    I spent 30 minutes of face-to-face and non-face-to-face time with patient.  This included previsit chart review, lab review, study review, order entry, electronic health record documentation, patient education.   Shawnie Dapper, MSN, FNP-C 02/06/2023, 4:16 PM  [facility] [address], [address] [place], [place] [address] [phone]

## 2023-02-05 NOTE — Patient Instructions (Signed)
Below is our plan:  We will continue donepezil 10mg  daily. We will increase memantine to 10mg  twice daily. You can use 5mg  tablets by taking 2 tablets twice daily until you run out then start one 10mg  tablet twice daily.   We will try a low dose of hydroxyzine 10mg  for sleep aid. Take about 30 minutes before bedtime. DO NOT PICK UP HYDRALAZINE. This was cancelled at pharmacy.   Please make sure you are staying well hydrated. I recommend 50-60 ounces daily. Well balanced diet and regular exercise encouraged. Consistent sleep schedule with 6-8 hours recommended.   Please continue follow up with care team as directed.   Follow up with me in 6 months   You may receive a survey regarding today's visit. I encourage you to leave honest feed back as I do use this information to improve patient care. Thank you for seeing me today!   Management of Memory Problems   There are some general things you can do to help manage your memory problems.  Your memory may not in fact recover, but by using techniques and strategies you will be able to manage your memory difficulties better.   1)  Establish a routine. Try to establish and then stick to a regular routine.  By doing this, you will get used to what to expect and you will reduce the need to rely on your memory.  Also, try to do things at the same time of day, such as taking your medication or checking your calendar first thing in the morning. Think about think that you can do as a part of a regular routine and make a list.  Then enter them into a daily planner to remind you.  This will help you establish a routine.   2)  Organize your environment. Organize your environment so that it is uncluttered.  Decrease visual stimulation.  Place everyday items such as keys or cell phone in the same place every day (ie.  Basket next to front door) Use post it notes with a brief message to yourself (ie. Turn off light, lock the door) Use labels to indicate where  things go (ie. Which cupboards are for food, dishes, etc.) Keep a notepad and pen by the telephone to take messages   3)  Memory Aids A diary or journal/notebook/daily planner Making a list (shopping list, chore list, to do list that needs to be done) Using an alarm as a reminder (kitchen timer or cell phone alarm) Using cell phone to store information (Notes, Calendar, Reminders) Calendar/White board placed in a prominent position Post-it notes   In order for memory aids to be useful, you need to have good habits.  It's no good remembering to make a note in your journal if you don't remember to look in it.  Try setting aside a certain time of day to look in journal.   4)  Improving mood and managing fatigue. There may be other factors that contribute to memory difficulties.  Factors, such as anxiety, depression and tiredness can affect memory. Regular gentle exercise can help improve your mood and give you more energy. Exercise: there are short videos created by the [employer] on Health specially for older adults: https://bit.ly/2I30q97.  Mediterranean diet: which emphasizes fruits, vegetables, whole grains, legumes, fish, and other seafood; unsaturated fats such as olive oils; and low amounts of red meat, eggs, and sweets. A variation of this, called MIND [facility] Intervention for Neurodegenerative Delay) incorporates the DASH (Dietary Approaches to  Stop Hypertension) diet, which has been shown to lower high blood pressure, a risk factor for Alzheimer's disease. More information at: ExitMarketing.de.  Aerobic exercise that improve heart health is also good for the mind.  [employer] on Aging have short videos for exercises that you can do at home: BlindWorkshop.com.pt Simple relaxation techniques may help relieve symptoms of anxiety Try to get back to completing activities or hobbies you  enjoyed doing in the past. Learn to pace yourself through activities to decrease fatigue. Find out about some local support groups where you can share experiences with others. Try and achieve 7-8 hours of sleep at night.   Resources for Family/Caregiver  Online caregiver support groups can be found at WesternTunes.it or call Alzheimer's Association's 24/7 hotline: (308)737-5699. [facility] Memory Counseling Program offers in-person, virtual support groups and individual counseling for both care partners and persons with memory loss. Call for more information at [phone].   Advanced care plan: there are two types of Power of Attorney: healthcare and durable. Healthcare POA is a designated person to make healthcare decisions on your behalf if you were too sick to make them yourself. This person can be selected and documented by your physician. Durable POA has to be set up with a lawyer who takes charge of your finances and estate if you were too sick or cognitively impaired to manage your finances accurately. You can find a local Elder Therapist, art here: [place].at.  Check out www.planyourlifespan.org, which will help you plan before a crisis and decide who will take care of life considerations in a circumstance where you may not be able to speak for yourself.   Helpful books (available on [employer] or your local bookstore):  By Dr. Carl Best: Keeping Love Alive as Memories Fade: The 5 Love Languages and the Alzheimer's Journey Jun 17, 2015 The Dementia Care Partner's Workbook: A Guide for Understanding, Education, and [employer] - February 14, 2018.  Both available for less than $15.   "Coping with behavior change in dementia: a family caregiver's guide" by Ricardo Jericho & Valora Piccolo "A Caregiver's Guide to Dementia: Using Activities and Other Strategies to Prevent, Reduce and Manage Behavioral Symptoms" by Mahala Menghini Gitlin and [employer].  Youth worker of Joy for the Person with  Alzheimer's or Dementia" 4th edition by Tama High  Caregiver videos on common behaviors related to dementia: PopulationGame.pl  [place] Caregiver Portal: free to sign up, links to local resources: [email or web address]

## 2023-02-06 ENCOUNTER — Encounter: Payer: Self-pay | Admitting: Family Medicine

## 2023-02-06 ENCOUNTER — Ambulatory Visit (INDEPENDENT_AMBULATORY_CARE_PROVIDER_SITE_OTHER): Payer: Self-pay | Admitting: Family Medicine

## 2023-02-06 VITALS — BP 131/57 | HR 68 | Ht 67.0 in | Wt 166.0 lb

## 2023-02-06 DIAGNOSIS — F03C0 Unspecified dementia, severe, without behavioral disturbance, psychotic disturbance, mood disturbance, and anxiety: Secondary | ICD-10-CM

## 2023-02-06 MED ORDER — DONEPEZIL HCL 10 MG PO TABS
10.0000 mg | ORAL_TABLET | Freq: Every day | ORAL | 3 refills | Status: DC
Start: 2023-02-06 — End: 2023-11-13

## 2023-02-06 MED ORDER — HYDRALAZINE HCL 10 MG PO TABS: 10.0000 mg | ORAL_TABLET | Freq: Every evening | ORAL | 1 refills | Status: DC | PRN

## 2023-02-06 MED ORDER — HYDROXYZINE HCL 10 MG PO TABS: 10.0000 mg | ORAL_TABLET | Freq: Every evening | ORAL | 1 refills | Status: DC | PRN

## 2023-02-06 MED ORDER — MEMANTINE HCL 10 MG PO TABS: 10.0000 mg | ORAL_TABLET | Freq: Two times a day (BID) | ORAL | 3 refills | Status: DC

## 2023-02-19 ENCOUNTER — Other Ambulatory Visit: Payer: Self-pay | Admitting: Family Medicine

## 2023-02-24 ENCOUNTER — Other Ambulatory Visit: Payer: Self-pay | Admitting: Family Medicine

## 2023-02-24 DIAGNOSIS — F419 Anxiety disorder, unspecified: Secondary | ICD-10-CM

## 2023-02-24 DIAGNOSIS — R630 Anorexia: Secondary | ICD-10-CM

## 2023-03-19 ENCOUNTER — Other Ambulatory Visit: Payer: Self-pay | Admitting: Family Medicine

## 2023-03-27 ENCOUNTER — Encounter: Payer: Self-pay | Admitting: Family Medicine

## 2023-04-20 ENCOUNTER — Other Ambulatory Visit: Payer: Self-pay | Admitting: Family Medicine

## 2023-04-20 DIAGNOSIS — E785 Hyperlipidemia, unspecified: Secondary | ICD-10-CM

## 2023-05-18 ENCOUNTER — Other Ambulatory Visit: Payer: Self-pay | Admitting: Family Medicine

## 2023-05-18 DIAGNOSIS — E785 Hyperlipidemia, unspecified: Secondary | ICD-10-CM

## 2023-06-11 ENCOUNTER — Other Ambulatory Visit: Payer: Self-pay | Admitting: Family Medicine

## 2023-06-11 DIAGNOSIS — F419 Anxiety disorder, unspecified: Secondary | ICD-10-CM

## 2023-06-11 DIAGNOSIS — R630 Anorexia: Secondary | ICD-10-CM

## 2023-06-16 ENCOUNTER — Other Ambulatory Visit: Payer: Self-pay | Admitting: Family Medicine

## 2023-08-04 ENCOUNTER — Other Ambulatory Visit: Payer: Self-pay | Admitting: *Deleted

## 2023-08-04 MED ORDER — HYDROXYZINE HCL 10 MG PO TABS: 10.0000 mg | ORAL_TABLET | Freq: Every evening | ORAL | 0 refills | Status: DC | PRN

## 2023-08-04 NOTE — Telephone Encounter (Signed)
Last seen on 02/06/23 Follow up scheduled on 08/21/23

## 2023-08-16 ENCOUNTER — Other Ambulatory Visit: Payer: Self-pay | Admitting: Family Medicine

## 2023-08-16 DIAGNOSIS — E785 Hyperlipidemia, unspecified: Secondary | ICD-10-CM

## 2023-08-20 NOTE — Progress Notes (Unsigned)
No chief complaint on file.   HISTORY OF PRESENT ILLNESS:  08/20/23 ALL:  Anteria returns for follow up for dementia. She was last seen 01/2023. We continued donepezil and memantine and added hydroxyzine PRN for insomnia. Since,   02/06/2023 ALL:  Kaydan returns for follow up for dementia. She was last seen 09/2022. We continued donepezil and added memantine. She has tolerated meds well. No significant changes. She continues to have progressive memory loss but seems fairly stable from last visit. She continues to wonder at night. [name] isn't overly bothered as she seems to stay in her rooom but when her cousin picks her up on the weekends, she keeps her up all night. They have tried sleep aids OTC that are not effective. She seems ot be eating normally. She likes hot dogs and chicken. Likes to eat sweets. Hallucinations are stable. She talks to people who aren't there. Mood seems stable. She does get sad from time to time and will cry. She continues mirtazapine through PCP.   10/03/2022 ALL: ZAHRIYA MARSCHNER is a 62 y.o. female here today for follow up for dementia. She was last seen by Dr Frances Furbish 06/2022 and brother reported progressive worsening. Donepezil increased to 10mg  daily. She seems to be tolerating it well. Her brother reports that he has seen progressive decline. She is up all hours of the night. She seems to wonder all over the house. He has had to put up a baby gate to keep her form wondering. He has been giving her Benadryl at bedtime that has seemed to help. She prefers junk foods. No trouble swallowing. Hallucinations are about the same. Her cousin comes by on the weekends to help her get ready for church.   HISTORY (copied from Dr Teofilo Pod previous note)  Ms. Doepke is a 62 year old right-handed woman with an underlying medical history of hypertension, hypothyroidism, reflux disease, hyperlipidemia, kidney stones, depression, and overweight state, who presents for follow up  consultation of her memory loss. The patient is accompanied by her brother again today and presents after a longer gap of over 18 months. She missed an appointment in the interim with Shawnie Dapper, NP on 05/22/21. I last saw her on 12/19/2020, at which time she reported feeling stable.  She had stopped taking her amlodipine for unclear reasons and unknown time.  We talked about her neuropsychological evaluation and recommendations.  She was advised to start low-dose donepezil 5 mg daily.     Today, 07/04/2022: She reports very little of her own history, she is unable to provide her history and history is provided by her brother.  She is able to answer a few questions but only with 1 or 2 word sentences.  Her brother reports that she has become worse over time, she is more forgetful, she sometimes talks to be able to passed on.  She has had visual and auditory hallucinations, some days are better than others.  She has a reasonable appetite, she has not been driving.  She no longer works.  She has not fallen.  He tries to push her water intake, she also likes to drink soda but he tries to limit it.  She has had no behavioral escalations, she has occasional left knee pain.    REVIEW OF SYSTEMS: Out of a complete 14 system review of symptoms, the patient complains only of the following symptoms, memory loss, hallucinations and all other reviewed systems are negative.   ALLERGIES: Allergies  Allergen Reactions   Voltaren [  Diclofenac Sodium] Other (See Comments)    Unknown, pt went to pick up at pharmacy and they said she was allergic     HOME MEDICATIONS: Outpatient Medications Prior to Visit  Medication Sig Dispense Refill   atorvastatin (LIPITOR) 20 MG tablet TAKE 1 TABLET BY MOUTH DAILY 90 tablet 1   CINNAMON PO Take 1,000 mg by mouth.     cyanocobalamin (VITAMIN B12) 1000 MCG tablet Take 1,000 mcg by mouth daily.     diclofenac Sodium (VOLTAREN) 1 % GEL Apply 4 g topically 4 (four) times daily. 150 g 1    donepezil (ARICEPT) 10 MG tablet Take 1 tablet (10 mg total) by mouth at bedtime. 90 tablet 3   hydrOXYzine (ATARAX) 10 MG tablet Take 1 tablet (10 mg total) by mouth at bedtime as needed. 90 tablet 0   levothyroxine (SYNTHROID) 75 MCG tablet TAKE 1 TABLET BY MOUTH DAILY BEFORE BREAKFAST 90 tablet 0   loratadine (CLARITIN) 10 MG tablet Take 1 tablet (10 mg total) by mouth daily. 30 tablet 2   losartan-hydrochlorothiazide (HYZAAR) 50-12.5 MG tablet TAKE 1 TABLET BY MOUTH DAILY 90 tablet 1   memantine (NAMENDA) 10 MG tablet Take 1 tablet (10 mg total) by mouth 2 (two) times daily. 180 tablet 3   mirtazapine (REMERON SOL-TAB) 15 MG disintegrating tablet PLACE 1 TABLET BY MOUTH EVERY NIGHT AT BEDTIME 90 tablet 0   omeprazole (PRILOSEC) 20 MG capsule Take 1 capsule (20 mg total) by mouth daily. 30 capsule 0   ondansetron (ZOFRAN ODT) 8 MG disintegrating tablet Take 1 tablet (8 mg total) by mouth every 8 (eight) hours as needed for nausea or vomiting. 20 tablet 0   potassium chloride SA (KLOR-CON M) 20 MEQ tablet Take 1 tablet (20 mEq total) by mouth daily. 7 tablet 0   No facility-administered medications prior to visit.     PAST MEDICAL HISTORY: Past Medical History:  Diagnosis Date   Chest pain 09/26/2015   Depression    Essential hypertension 09/26/2015   GERD (gastroesophageal reflux disease)    Hypertension    Hypothyroidism 09/26/2015   Murmur 09/26/2015   Thyroid disease      PAST SURGICAL HISTORY: Past Surgical History:  Procedure Laterality Date   ABDOMINAL HYSTERECTOMY     CESAREAN SECTION     LAPAROSCOPIC ABDOMINAL EXPLORATION N/A 11/21/2017   Procedure: LAPAROSCOPIC ABDOMINAL EXPLORATION LYSIS OF ADHESIONS;  Surgeon: Almond Lint, MD;  Location: [facility];  Service: General;  Laterality: N/A;   LAPAROTOMY N/A 10/28/2016   Procedure: EXPLORATORY LAPAROTOMY WITH  LYSIS OF ADHESIONS;  Surgeon: Avel Peace, MD;  Location: [facility];  Service: General;  Laterality: N/A;      FAMILY HISTORY: Family History  Problem Relation Age of Onset   Cancer Mother        lung cancer   Hypertension Mother    Cancer Father    Heart attack Father    Mental illness Sister    Hypertension Sister    Hyperlipidemia Sister    Dementia Sister    Diabetes Brother    Hypertension Brother      SOCIAL HISTORY: Social History   Socioeconomic History   Marital status: Widowed    Spouse name: Not on file   Number of children: Not on file   Years of education: Not on file   Highest education level: Not on file  Occupational History   Not on file  Tobacco Use   Smoking status: Never   Smokeless tobacco:  Never  Vaping Use   Vaping status: Never Used  Substance and Sexual Activity   Alcohol use: No    Alcohol/week: 0.0 standard drinks of alcohol   Drug use: No   Sexual activity: Not Currently    Comment: 1st intercourse- 15, partners3, widow  Other Topics Concern   Not on file  Social History Narrative   Epworth Sleepiness Scale = 6 (as of 09/26/2015)   Social Determinants of Health   Financial Resource Strain: Not on file  Food Insecurity: Not on file  Transportation Needs: Not on file  Physical Activity: Not on file  Stress: Not on file  Social Connections: Not on file  Intimate Partner Violence: Not on file     PHYSICAL EXAM  There were no vitals filed for this visit.   There is no height or weight on file to calculate BMI.  Generalized: Well developed, in no acute distress  Cardiology: normal rate and rhythm, no murmur auscultated  Respiratory: clear to auscultation bilaterally    Neurological examination  Mentation: Alert, not oriented to time, place, or history taking. Follows most commands speech and language fluent Cranial nerve II-XII: Pupils were equal round reactive to light. Extraocular movements were full, visual field were full on confrontational test. Facial sensation and strength were normal. Uvula tongue midline. Head turning  and shoulder shrug  were normal and symmetric. Motor: The motor testing reveals 5 over 5 strength of all 4 extremities. Good symmetric motor tone is noted throughout.  Gait and station: Gait is normal.    DIAGNOSTIC DATA (LABS, IMAGING, TESTING) - I reviewed patient records, labs, notes, testing and imaging myself where available.  Lab Results  Component Value Date   WBC 4.1 01/03/2023   HGB 11.9 (L) 01/03/2023   HCT 37.1 01/03/2023   MCV 83.3 01/03/2023   PLT 413.0 (H) 01/03/2023      Component Value Date/Time   NA 140 01/03/2023 1330   K 4.0 01/03/2023 1330   CL 101 01/03/2023 1330   CO2 31 01/03/2023 1330   GLUCOSE 86 01/03/2023 1330   BUN 13 01/03/2023 1330   CREATININE 0.87 01/03/2023 1330   CREATININE 0.83 09/26/2015 0955   CALCIUM 9.8 01/03/2023 1330   PROT 7.5 01/03/2023 1330   ALBUMIN 4.2 01/03/2023 1330   AST 19 01/03/2023 1330   ALT 16 01/03/2023 1330   ALKPHOS 91 01/03/2023 1330   BILITOT 0.4 01/03/2023 1330   GFRNONAA >60 02/17/2021 1415   GFRAA >60 11/26/2017 0504   Lab Results  Component Value Date   CHOL 177 04/02/2022   HDL 53.70 04/02/2022   LDLCALC 113 (H) 04/02/2022   TRIG 55.0 04/02/2022   CHOLHDL 3 04/02/2022   Lab Results  Component Value Date   HGBA1C 5.6 01/03/2023   Lab Results  Component Value Date   VITAMINB12 >1500 (H) 11/26/2022   Lab Results  Component Value Date   TSH 1.13 01/03/2023       02/06/2023    3:18 PM 04/04/2020   10:24 AM  MMSE - Mini Mental State Exam  Orientation to time 0 2  Orientation to Place 2 4  Registration 0 3  Attention/ Calculation 0 5  Recall 0 0  Language- name 2 objects 2 2  Language- repeat 1 1  Language- follow 3 step command 0 3  Language- read & follow direction 0 1  Write a sentence 0 1  Copy design 0 0  Total score 5 22  No data to display               No data to display           ASSESSMENT AND PLAN  63 y.o. year old female  has a past medical history of  Chest pain (09/26/2015), Depression, Essential hypertension (09/26/2015), GERD (gastroesophageal reflux disease), Hypertension, Hypothyroidism (09/26/2015), Murmur (09/26/2015), and Thyroid disease. here with    No diagnosis found.  ANIAS CUTCHIN has continued to have progressive cognitive decline. She is tolerating donepezil with no worsening of hallucinations. We will continue 10mg  daily. I will increase memantine to 10mg  BID. I will prescribe hydroxyzine 10mg  QHS PRN to see if this may help with insomnia. Possible side effects reviewed. Hydralazine prescribed by mistake but I have talked with pharmacist at [facility] who cancelled order. I have educated [name] on correct medication to have filled. I have discussed progressive nature of disease with her brother, [name]. Caregiver resources discussed. Consider [facility]. Healthy lifestyle habits encouraged. She will follow up with PCP as directed. She will return to see me in 6 months, sooner if needed. She verbalizes understanding and agreement with this plan.   No orders of the defined types were placed in this encounter.    No orders of the defined types were placed in this encounter.   I spent 30 minutes of face-to-face and non-face-to-face time with patient.  This included previsit chart review, lab review, study review, order entry, electronic health record documentation, patient education.   Shawnie Dapper, MSN, FNP-C 08/20/2023, 12:51 PM  [facility] [address] [place], [place] [address] [phone]

## 2023-08-20 NOTE — Patient Instructions (Incomplete)
Below is our plan:  We will continue donepezil 10mg and memantine 10mg daily  Please make sure you are staying well hydrated. I recommend 50-60 ounces daily. Well balanced diet and regular exercise encouraged. Consistent sleep schedule with 6-8 hours recommended.   Please continue follow up with care team as directed.   Follow up with me in 1 year   You may receive a survey regarding today's visit. I encourage you to leave honest feed back as I do use this information to improve patient care. Thank you for seeing me today!   Management of Memory Problems   There are some general things you can do to help manage your memory problems.  Your memory may not in fact recover, but by using techniques and strategies you will be able to manage your memory difficulties better.   1)  Establish a routine. Try to establish and then stick to a regular routine.  By doing this, you will get used to what to expect and you will reduce the need to rely on your memory.  Also, try to do things at the same time of day, such as taking your medication or checking your calendar first thing in the morning. Think about think that you can do as a part of a regular routine and make a list.  Then enter them into a daily planner to remind you.  This will help you establish a routine.   2)  Organize your environment. Organize your environment so that it is uncluttered.  Decrease visual stimulation.  Place everyday items such as keys or cell phone in the same place every day (ie.  Basket next to front door) Use post it notes with a brief message to yourself (ie. Turn off light, lock the door) Use labels to indicate where things go (ie. Which cupboards are for food, dishes, etc.) Keep a notepad and pen by the telephone to take messages   3)  Memory Aids A diary or journal/notebook/daily planner Making a list (shopping list, chore list, to do list that needs to be done) Using an alarm as a reminder (kitchen timer or cell  phone alarm) Using cell phone to store information (Notes, Calendar, Reminders) Calendar/White board placed in a prominent position Post-it notes   In order for memory aids to be useful, you need to have good habits.  It's no good remembering to make a note in your journal if you don't remember to look in it.  Try setting aside a certain time of day to look in journal.   4)  Improving mood and managing fatigue. There may be other factors that contribute to memory difficulties.  Factors, such as anxiety, depression and tiredness can affect memory. Regular gentle exercise can help improve your mood and give you more energy. Exercise: there are short videos created by the National Institute on Health specially for older adults: https://bit.ly/2I30q97.  Mediterranean diet: which emphasizes fruits, vegetables, whole grains, legumes, fish, and other seafood; unsaturated fats such as olive oils; and low amounts of red meat, eggs, and sweets. A variation of this, called MIND (Mediterranean-DASH Intervention for Neurodegenerative Delay) incorporates the DASH (Dietary Approaches to Stop Hypertension) diet, which has been shown to lower high blood pressure, a risk factor for Alzheimer's disease. More information at: https://www.nia.nih.gov/health/what-do-we-know-about-diet-and-prevention-alzheimers-disease.  Aerobic exercise that improve heart health is also good for the mind.  National Institute on Aging have short videos for exercises that you can do at home: www.nia.nih.gov/Go4Life Simple relaxation techniques may help relieve   symptoms of anxiety Try to get back to completing activities or hobbies you enjoyed doing in the past. Learn to pace yourself through activities to decrease fatigue. Find out about some local support groups where you can share experiences with others. Try and achieve 7-8 hours of sleep at night.   Resources for Family/Caregiver  Online caregiver support groups can be found at  alz.org or call Alzheimer's Association's 24/7 hotline: 800.272.3900. [facility] offers in-person, virtual support groups and individual counseling for both care partners and persons with memory loss. Call for more information at [phone].   Advanced care plan: there are two types of Power of Attorney: healthcare and durable. Healthcare POA is a designated person to make healthcare decisions on your behalf if you were too sick to make them yourself. This person can be selected and documented by your physician. Durable POA has to be set up with a lawyer who takes charge of your finances and estate if you were too sick or cognitively impaired to manage your finances accurately. You can find a local Elder Law lawyer here: https://www.naela.org/.  Check out www.planyourlifespan.org, which will help you plan before a crisis and decide who will take care of life considerations in a circumstance where you may not be able to speak for yourself.   Helpful books (available on Amazon or your local bookstore):  By Dr. Ed Shaw: Keeping Love Alive as Memories Fade: The 5 Love Languages and the Alzheimer's Journey Jun 17, 2015 The Dementia Care Partner's Workbook: A Guide for Understanding, Education, and Hope Paperback - February 14, 2018.  Both available for less than $15.   "Coping with behavior change in dementia: a family caregiver's guide" by Beth Spencer & Laurie White "A Caregiver's Guide to Dementia: Using Activities and Other Strategies to Prevent, Reduce and Manage Behavioral Symptoms" by Laura N. Gitlin and Catherine Piersol.  "Creating Moments of Joy for the Person with Alzheimer's or Dementia" 4th edition by Jolene Brackrey  Caregiver videos on common behaviors related to dementia: https://www.uclahealth.org/dementia/caregiver-education-videos  [facility] Caregiver Portal: free to sign up, links to local resources: [email or web address]/login  

## 2023-08-21 ENCOUNTER — Ambulatory Visit: Payer: Medicare PPO | Admitting: Family Medicine

## 2023-08-21 DIAGNOSIS — F03C0 Unspecified dementia, severe, without behavioral disturbance, psychotic disturbance, mood disturbance, and anxiety: Secondary | ICD-10-CM

## 2023-09-10 ENCOUNTER — Other Ambulatory Visit: Payer: Self-pay | Admitting: Family Medicine

## 2023-09-10 DIAGNOSIS — F419 Anxiety disorder, unspecified: Secondary | ICD-10-CM

## 2023-09-10 DIAGNOSIS — R630 Anorexia: Secondary | ICD-10-CM

## 2023-11-03 ENCOUNTER — Other Ambulatory Visit: Payer: Self-pay | Admitting: *Deleted

## 2023-11-03 DIAGNOSIS — F03C0 Unspecified dementia, severe, without behavioral disturbance, psychotic disturbance, mood disturbance, and anxiety: Secondary | ICD-10-CM

## 2023-11-04 MED ORDER — HYDROXYZINE HCL 10 MG PO TABS: 10.0000 mg | ORAL_TABLET | Freq: Every evening | ORAL | 1 refills | Status: DC | PRN

## 2023-11-13 NOTE — Progress Notes (Signed)
 Chief Complaint  Patient presents with   Dementia    Rm1, BROTHER PRESENT, DEMENTIA (NO MEMORY TEST SCORED LOW PREVIOUSLY): husband stated wife seemed about the same as last time. No worse "which is a good thing!" According to pt husband. Needs another bp check prior to leaving     HISTORY OF PRESENT ILLNESS:  11/19/23 ALL:  Debbie Brandt returns for follow up for dementia. She continues donepezil and memantine. NO significant changes. She is not as active. She has gained some weight. Appetite is good. Sleeping well. No behavioral concerns. No falls.   02/06/2023 ALL: Debbie Brandt returns for follow up for dementia. She was last seen 09/2022. We continued donepezil and added memantine. She has tolerated meds well. No significant changes. She continues to have progressive memory loss but seems fairly stable from last visit. She continues to wonder at night. Debbie Brandt isn't overly bothered as she seems to stay in her rooom but when her cousin picks her up on the weekends, she keeps her up all night. They have tried sleep aids OTC that are not effective. She seems ot be eating normally. She likes hot dogs and chicken. Likes to eat sweets. Hallucinations are stable. She talks to people who aren't there. Mood seems stable. She does get sad from time to time and will cry. She continues mirtazapine through PCP.   10/03/2022 ALL: Debbie Brandt is a 63 y.o. female here today for follow up for dementia. She was last seen by Dr Frances Furbish 06/2022 and brother reported progressive worsening. Donepezil increased to 10mg  daily. She seems to be tolerating it well. Her brother reports that he has seen progressive decline. She is up all hours of the night. She seems to wonder all over the house. He has had to put up a baby gate to keep her form wondering. He has been giving her Benadryl at bedtime that has seemed to help. She prefers junk foods. No trouble swallowing. Hallucinations are about the same. Her cousin comes by on the  weekends to help her get ready for church.   HISTORY (copied from Dr Teofilo Pod previous note)  Debbie Brandt is a 63 year old right-handed woman with an underlying medical history of hypertension, hypothyroidism, reflux disease, hyperlipidemia, kidney stones, depression, and overweight state, who presents for follow up consultation of her memory loss. The patient is accompanied by her brother again today and presents after a longer gap of over 18 months. She missed an appointment in the interim with Shawnie Dapper, NP on 05/22/21. I last saw her on 12/19/2020, at which time she reported feeling stable.  She had stopped taking her amlodipine for unclear reasons and unknown time.  We talked about her neuropsychological evaluation and recommendations.  She was advised to start low-dose donepezil 5 mg daily.     Today, 07/04/2022: She reports very little of her own history, she is unable to provide her history and history is provided by her brother.  She is able to answer a few questions but only with 1 or 2 word sentences.  Her brother reports that she has become worse over time, she is more forgetful, she sometimes talks to be able to passed on.  She has had visual and auditory hallucinations, some days are better than others.  She has a reasonable appetite, she has not been driving.  She no longer works.  She has not fallen.  He tries to push her water intake, she also likes to drink soda but he tries to limit  it.  She has had no behavioral escalations, she has occasional left knee pain.    REVIEW OF SYSTEMS: Out of a complete 14 system review of symptoms, the patient complains only of the following symptoms, memory loss, hallucinations and all other reviewed systems are negative.   ALLERGIES: Allergies  Allergen Reactions   Voltaren [Diclofenac Sodium] Other (See Comments)    Unknown, pt went to pick up at pharmacy and they said she was allergic     HOME MEDICATIONS: Outpatient Medications Prior to Visit   Medication Sig Dispense Refill   atorvastatin (LIPITOR) 20 MG tablet TAKE 1 TABLET BY MOUTH DAILY 90 tablet 1   CINNAMON PO Take 1,000 mg by mouth.     cyanocobalamin (VITAMIN B12) 1000 MCG tablet Take 1,000 mcg by mouth daily.     diclofenac Sodium (VOLTAREN) 1 % GEL Apply 4 g topically 4 (four) times daily. 150 g 1   hydrOXYzine (ATARAX) 10 MG tablet Take 1 tablet (10 mg total) by mouth at bedtime as needed. 90 tablet 1   levothyroxine (SYNTHROID) 75 MCG tablet TAKE 1 TABLET BY MOUTH DAILY BEFORE BREAKFAST 90 tablet 0   loratadine (CLARITIN) 10 MG tablet Take 1 tablet (10 mg total) by mouth daily. 30 tablet 2   losartan-hydrochlorothiazide (HYZAAR) 50-12.5 MG tablet TAKE 1 TABLET BY MOUTH DAILY 90 tablet 1   mirtazapine (REMERON SOL-TAB) 15 MG disintegrating tablet PLACE 1 TABLET BY MOUTH EVERY NIGHT AT BEDTIME 90 tablet 0   omeprazole (PRILOSEC) 20 MG capsule Take 1 capsule (20 mg total) by mouth daily. 30 capsule 0   ondansetron (ZOFRAN ODT) 8 MG disintegrating tablet Take 1 tablet (8 mg total) by mouth every 8 (eight) hours as needed for nausea or vomiting. 20 tablet 0   potassium chloride SA (KLOR-CON M) 20 MEQ tablet Take 1 tablet (20 mEq total) by mouth daily. 7 tablet 0   donepezil (ARICEPT) 10 MG tablet Take 1 tablet (10 mg total) by mouth at bedtime. 90 tablet 3   memantine (NAMENDA) 10 MG tablet Take 1 tablet (10 mg total) by mouth 2 (two) times daily. 180 tablet 3   No facility-administered medications prior to visit.     PAST MEDICAL HISTORY: Past Medical History:  Diagnosis Date   Chest pain 09/26/2015   Depression    Essential hypertension 09/26/2015   GERD (gastroesophageal reflux disease)    Hypertension    Hypothyroidism 09/26/2015   Murmur 09/26/2015   Thyroid disease      PAST SURGICAL HISTORY: Past Surgical History:  Procedure Laterality Date   ABDOMINAL HYSTERECTOMY     CESAREAN SECTION     LAPAROSCOPIC ABDOMINAL EXPLORATION N/A 11/21/2017   Procedure:  LAPAROSCOPIC ABDOMINAL EXPLORATION LYSIS OF ADHESIONS;  Surgeon: Almond Lint, MD;  Location: [facility];  Service: General;  Laterality: N/A;   LAPAROTOMY N/A 10/28/2016   Procedure: EXPLORATORY LAPAROTOMY WITH  LYSIS OF ADHESIONS;  Surgeon: Avel Peace, MD;  Location: [facility];  Service: General;  Laterality: N/A;     FAMILY HISTORY: Family History  Problem Relation Age of Onset   Cancer Mother        lung cancer   Hypertension Mother    Cancer Father    Heart attack Father    Mental illness Sister    Hypertension Sister    Hyperlipidemia Sister    Dementia Sister    Diabetes Brother    Hypertension Brother      SOCIAL HISTORY: Social History   Socioeconomic History  Marital status: Widowed    Spouse name: Not on file   Number of children: Not on file   Years of education: Not on file   Highest education level: Not on file  Occupational History   Not on file  Tobacco Use   Smoking status: Never   Smokeless tobacco: Never  Vaping Use   Vaping status: Never Used  Substance and Sexual Activity   Alcohol use: No    Alcohol/week: 0.0 standard drinks of alcohol   Drug use: No   Sexual activity: Not Currently    Comment: 1st intercourse- 15, partners3, widow  Other Topics Concern   Not on file  Social History Narrative   Epworth Sleepiness Scale = 6 (as of 09/26/2015)   Social Drivers of Corporate investment banker Strain: Not on file  Food Insecurity: Not on file  Transportation Needs: Not on file  Physical Activity: Not on file  Stress: Not on file  Social Connections: Not on file  Intimate Partner Violence: Not on file     PHYSICAL EXAM  Vitals:   11/19/23 1527  BP: (!) 153/84  Pulse: 61  Weight: 168 lb (76.2 kg)  Height: 5\' 3"  (1.6 m)     Body mass index is 29.76 kg/m.  Generalized: Well developed, in no acute distress  Cardiology: normal rate and rhythm, no murmur auscultated  Respiratory: clear to auscultation bilaterally    Neurological  examination  Mentation: Alert, not oriented to time, place, or history taking. Follows most commands speech and language fluent Cranial nerve II-XII: Pupils were equal round reactive to light. Extraocular movements were full, visual field were full on confrontational test. Facial sensation and strength were normal. Uvula tongue midline. Head turning and shoulder shrug  were normal and symmetric. Motor: The motor testing reveals 5 over 5 strength of all 4 extremities. Good symmetric motor tone is noted throughout.  Gait and station: Gait is normal.    DIAGNOSTIC DATA (LABS, IMAGING, TESTING) - I reviewed patient records, labs, notes, testing and imaging myself where available.  Lab Results  Component Value Date   WBC 4.1 01/03/2023   HGB 11.9 (L) 01/03/2023   HCT 37.1 01/03/2023   MCV 83.3 01/03/2023   PLT 413.0 (H) 01/03/2023      Component Value Date/Time   NA 140 01/03/2023 1330   K 4.0 01/03/2023 1330   CL 101 01/03/2023 1330   CO2 31 01/03/2023 1330   GLUCOSE 86 01/03/2023 1330   BUN 13 01/03/2023 1330   CREATININE 0.87 01/03/2023 1330   CREATININE 0.83 09/26/2015 0955   CALCIUM 9.8 01/03/2023 1330   PROT 7.5 01/03/2023 1330   ALBUMIN 4.2 01/03/2023 1330   AST 19 01/03/2023 1330   ALT 16 01/03/2023 1330   ALKPHOS 91 01/03/2023 1330   BILITOT 0.4 01/03/2023 1330   GFRNONAA >60 02/17/2021 1415   GFRAA >60 11/26/2017 0504   Lab Results  Component Value Date   CHOL 177 04/02/2022   HDL 53.70 04/02/2022   LDLCALC 113 (H) 04/02/2022   TRIG 55.0 04/02/2022   CHOLHDL 3 04/02/2022   Lab Results  Component Value Date   HGBA1C 5.6 01/03/2023   Lab Results  Component Value Date   VITAMINB12 >1500 (H) 11/26/2022   Lab Results  Component Value Date   TSH 1.13 01/03/2023       02/06/2023    3:18 PM 04/04/2020   10:24 AM  MMSE - Mini Mental State Exam  Orientation to time 0  2  Orientation to Place 2 4  Registration 0 3  Attention/ Calculation 0 5  Recall 0 0   Language- name 2 objects 2 2  Language- repeat 1 1  Language- follow 3 step command 0 3  Language- read & follow direction 0 1  Write a sentence 0 1  Copy design 0 0  Total score 5 22        No data to display               No data to display           ASSESSMENT AND PLAN  63 y.o. year old female  has a past medical history of Chest pain (09/26/2015), Depression, Essential hypertension (09/26/2015), GERD (gastroesophageal reflux disease), Hypertension, Hypothyroidism (09/26/2015), Murmur (09/26/2015), and Thyroid disease. here with    Severe dementia without behavioral disturbance, psychotic disturbance, mood disturbance, or anxiety, unspecified dementia type (HCC)  [name] has continued to have progressive cognitive decline. She is tolerating donepezil with no worsening of hallucinations. We will continue 10mg  daily and memantine  10mg  BID. She is sleeping better. May continue hydroxyzine as needed. I have discussed progressive nature of disease with her brother, Debbie Brandt. Caregiver resources discussed. Consider [facility]. Healthy lifestyle habits encouraged. She will follow up with PCP as directed. She will return to see me in 6-8 months, sooner if needed. She verbalizes understanding and agreement with this plan.   No orders of the defined types were placed in this encounter.    Meds ordered this encounter  Medications   donepezil (ARICEPT) 10 MG tablet    Sig: Take 1 tablet (10 mg total) by mouth at bedtime.    Dispense:  90 tablet    Refill:  3    Pt brother will call when refill needed    Supervising Provider:   Anson Fret [[id number]]   memantine (NAMENDA) 10 MG tablet    Sig: Take 1 tablet (10 mg total) by mouth 2 (two) times daily.    Dispense:  180 tablet    Refill:  3    Pt brother will call when refill needed    Supervising Provider:   Anson Fret [[id number]]    I spent 30 minutes of face-to-face and non-face-to-face time with patient.  This  included previsit chart review, lab review, study review, order entry, electronic health record documentation, patient education.   Shawnie Dapper, MSN, FNP-C 11/19/2023, 4:46 PM  [facility] [address] [place], [place] [address] [phone]

## 2023-11-13 NOTE — Patient Instructions (Addendum)
 Below is our plan:  We will continue donepezil and memantine as prescribed. Keep an eye on her blood pressure.   Please make sure you are staying well hydrated. I recommend 50-60 ounces daily. Well balanced diet and regular exercise encouraged. Consistent sleep schedule with 6-8 hours recommended.   Please continue follow up with care team as directed.   Follow up with me in 6-8 months   You may receive a survey regarding today's visit. I encourage you to leave honest feed back as I do use this information to improve patient care. Thank you for seeing me today!   Management of Memory Problems   There are some general things you can do to help manage your memory problems.  Your memory may not in fact recover, but by using techniques and strategies you will be able to manage your memory difficulties better.   1)  Establish a routine. Try to establish and then stick to a regular routine.  By doing this, you will get used to what to expect and you will reduce the need to rely on your memory.  Also, try to do things at the same time of day, such as taking your medication or checking your calendar first thing in the morning. Think about think that you can do as a part of a regular routine and make a list.  Then enter them into a daily planner to remind you.  This will help you establish a routine.   2)  Organize your environment. Organize your environment so that it is uncluttered.  Decrease visual stimulation.  Place everyday items such as keys or cell phone in the same place every day (ie.  Basket next to front door) Use post it notes with a brief message to yourself (ie. Turn off light, lock the door) Use labels to indicate where things go (ie. Which cupboards are for food, dishes, etc.) Keep a notepad and pen by the telephone to take messages   3)  Memory Aids A diary or journal/notebook/daily planner Making a list (shopping list, chore list, to do list that needs to be done) Using an  alarm as a reminder (kitchen timer or cell phone alarm) Using cell phone to store information (Notes, Calendar, Reminders) Calendar/White board placed in a prominent position Post-it notes   In order for memory aids to be useful, you need to have good habits.  It's no good remembering to make a note in your journal if you don't remember to look in it.  Try setting aside a certain time of day to look in journal.   4)  Improving mood and managing fatigue. There may be other factors that contribute to memory difficulties.  Factors, such as anxiety, depression and tiredness can affect memory. Regular gentle exercise can help improve your mood and give you more energy. Exercise: there are short videos created by the [employer] on Health specially for older adults: https://bit.ly/2I30q97.  Mediterranean diet: which emphasizes fruits, vegetables, whole grains, legumes, fish, and other seafood; unsaturated fats such as olive oils; and low amounts of red meat, eggs, and sweets. A variation of this, called MIND (Mediterranean-DASH Intervention for Neurodegenerative Delay) incorporates the DASH (Dietary Approaches to Stop Hypertension) diet, which has been shown to lower high blood pressure, a risk factor for Alzheimer's disease. More information at: ExitMarketing.de.  Aerobic exercise that improve heart health is also good for the mind.  [employer] on Aging have short videos for exercises that you can do at home:  BlindWorkshop.com.pt Simple relaxation techniques may help relieve symptoms of anxiety Try to get back to completing activities or hobbies you enjoyed doing in the past. Learn to pace yourself through activities to decrease fatigue. Find out about some local support groups where you can share experiences with others. Try and achieve 7-8 hours of sleep at night.   Resources for Family/Caregiver  Online  caregiver support groups can be found at WesternTunes.it or call Alzheimer's Association's 24/7 hotline: 915-520-2383. [facility] Memory Counsel[facility] Program offers in-person, virtual support groups and individual counseling for both care partners and persons with memory loss. Call for more information at [phone].   Advanced care plan: there are two types of Power of Attorney: healthcare and durable. Healthcare POA is a designated person to make healthcare decisions on your behalf if you were too sick to make them yourself. This person can be selected and documented by your physician. Durable POA has to be set up with a lawyer who takes charge of your finances and estate if you were too sick or cognitively impaired to manage your finances accurately. You can find a local Elder Therapist, art here: [place].at.  Check out www.planyourlifespan.org, which will help you plan before a crisis and decide who will take care of life considerations in a circumstance where you may not be able to speak for yourself.   Helpful books (available on [employer] or your local bookstore):  By Dr. Carl Best: Keeping Love Alive as Memories Fade: The 5 Love Languages and the Alzheimer's Journey Jun 17, 2015 The Dementia Care Partner's Workbook: A Guide for Understanding, Education, and [employer] - February 14, 2018.  Both available for less than $15.   "Coping with behavior change in dementia: a family caregiver's guide" by Ricardo Jericho & Valora Piccolo "A Caregiver's Guide to Dementia: Using Activities and Other Strategies to Prevent, Reduce and Manage Behavioral Symptoms" by Mahala Menghini Gitlin and [employer].  Youth worker of Joy for the Person with Alzheimer's or Dementia" 4th edition by [employer]  Caregiver videos on common behaviors related to dementia: PopulationGame.pl  [facility] Caregiver Portal: free to sign up, links to local resources:  [email or web address]

## 2023-11-19 ENCOUNTER — Encounter: Payer: Self-pay | Admitting: Family Medicine

## 2023-11-19 ENCOUNTER — Ambulatory Visit (INDEPENDENT_AMBULATORY_CARE_PROVIDER_SITE_OTHER): Payer: Medicare PPO | Admitting: Family Medicine

## 2023-11-19 VITALS — BP 153/84 | HR 61 | Ht 63.0 in | Wt 168.0 lb

## 2023-11-19 DIAGNOSIS — F03C0 Unspecified dementia, severe, without behavioral disturbance, psychotic disturbance, mood disturbance, and anxiety: Secondary | ICD-10-CM | POA: Diagnosis not present

## 2023-11-19 MED ORDER — DONEPEZIL HCL 10 MG PO TABS
10.0000 mg | ORAL_TABLET | Freq: Every day | ORAL | 3 refills | Status: DC
Start: 2023-11-19 — End: 2024-06-08

## 2023-11-19 MED ORDER — MEMANTINE HCL 10 MG PO TABS: 10.0000 mg | ORAL_TABLET | Freq: Two times a day (BID) | ORAL | 3 refills | Status: DC

## 2023-12-17 ENCOUNTER — Other Ambulatory Visit: Payer: Self-pay | Admitting: Family Medicine

## 2023-12-17 DIAGNOSIS — R630 Anorexia: Secondary | ICD-10-CM

## 2023-12-17 DIAGNOSIS — F419 Anxiety disorder, unspecified: Secondary | ICD-10-CM

## 2024-01-18 ENCOUNTER — Other Ambulatory Visit: Payer: Self-pay | Admitting: Family Medicine

## 2024-01-18 DIAGNOSIS — F419 Anxiety disorder, unspecified: Secondary | ICD-10-CM

## 2024-01-18 DIAGNOSIS — R630 Anorexia: Secondary | ICD-10-CM

## 2024-02-18 ENCOUNTER — Other Ambulatory Visit: Payer: Self-pay | Admitting: Family Medicine

## 2024-02-18 DIAGNOSIS — E785 Hyperlipidemia, unspecified: Secondary | ICD-10-CM

## 2024-02-22 ENCOUNTER — Other Ambulatory Visit: Payer: Self-pay | Admitting: Family Medicine

## 2024-02-22 DIAGNOSIS — R630 Anorexia: Secondary | ICD-10-CM

## 2024-02-22 DIAGNOSIS — F419 Anxiety disorder, unspecified: Secondary | ICD-10-CM

## 2024-03-15 ENCOUNTER — Emergency Department (HOSPITAL_BASED_OUTPATIENT_CLINIC_OR_DEPARTMENT_OTHER)

## 2024-03-15 ENCOUNTER — Emergency Department (HOSPITAL_BASED_OUTPATIENT_CLINIC_OR_DEPARTMENT_OTHER)
Admission: EM | Admit: 2024-03-15 | Discharge: 2024-03-15 | Disposition: A | Discharge status: Home / Self Care | Attending: Emergency Medicine | Admitting: Emergency Medicine

## 2024-03-15 ENCOUNTER — Other Ambulatory Visit: Payer: Self-pay

## 2024-03-15 DIAGNOSIS — E876 Hypokalemia: Secondary | ICD-10-CM | POA: Diagnosis not present

## 2024-03-15 DIAGNOSIS — R413 Other amnesia: Secondary | ICD-10-CM | POA: Insufficient documentation

## 2024-03-15 DIAGNOSIS — Z79899 Other long term (current) drug therapy: Secondary | ICD-10-CM | POA: Insufficient documentation

## 2024-03-15 DIAGNOSIS — M2011 Hallux valgus (acquired), right foot: Secondary | ICD-10-CM | POA: Insufficient documentation

## 2024-03-15 DIAGNOSIS — M25571 Pain in right ankle and joints of right foot: Secondary | ICD-10-CM | POA: Insufficient documentation

## 2024-03-15 DIAGNOSIS — M79671 Pain in right foot: Secondary | ICD-10-CM | POA: Diagnosis not present

## 2024-03-15 DIAGNOSIS — I10 Essential (primary) hypertension: Secondary | ICD-10-CM | POA: Diagnosis not present

## 2024-03-15 DIAGNOSIS — F0390 Unspecified dementia without behavioral disturbance: Secondary | ICD-10-CM | POA: Diagnosis not present

## 2024-03-15 DIAGNOSIS — E669 Obesity, unspecified: Secondary | ICD-10-CM | POA: Diagnosis not present

## 2024-03-15 DIAGNOSIS — E039 Hypothyroidism, unspecified: Secondary | ICD-10-CM | POA: Diagnosis not present

## 2024-03-15 DIAGNOSIS — M7731 Calcaneal spur, right foot: Secondary | ICD-10-CM | POA: Diagnosis not present

## 2024-03-15 DIAGNOSIS — M21611 Bunion of right foot: Secondary | ICD-10-CM | POA: Diagnosis not present

## 2024-03-15 DIAGNOSIS — M79604 Pain in right leg: Secondary | ICD-10-CM | POA: Insufficient documentation

## 2024-03-15 DIAGNOSIS — M7989 Other specified soft tissue disorders: Secondary | ICD-10-CM | POA: Insufficient documentation

## 2024-03-15 DIAGNOSIS — M19071 Primary osteoarthritis, right ankle and foot: Secondary | ICD-10-CM | POA: Diagnosis not present

## 2024-03-15 DIAGNOSIS — M79661 Pain in right lower leg: Secondary | ICD-10-CM | POA: Diagnosis not present

## 2024-03-15 LAB — COMPREHENSIVE METABOLIC PANEL WITH GFR
ALT: 10 U/L (ref 0–44)
AST: 19 U/L (ref 15–41)
Albumin: 3.9 g/dL (ref 3.5–5.0)
Alkaline Phosphatase: 108 U/L (ref 38–126)
Anion gap: 11 (ref 5–15)
BUN: 11 mg/dL (ref 8–23)
CO2: 30 mmol/L (ref 22–32)
Calcium: 9.7 mg/dL (ref 8.9–10.3)
Chloride: 105 mmol/L (ref 98–111)
Creatinine, Ser: 1.09 mg/dL — ABNORMAL HIGH (ref 0.44–1.00)
GFR, Estimated: 56 mL/min — ABNORMAL LOW (ref >60)
Glucose, Bld: 115 mg/dL — ABNORMAL HIGH (ref 70–99)
Potassium: 2.9 mmol/L — ABNORMAL LOW (ref 3.5–5.1)
Sodium: 145 mmol/L (ref 135–145)
Total Bilirubin: 0.5 mg/dL (ref 0.0–1.2)
Total Protein: 7.4 g/dL (ref 6.5–8.1)

## 2024-03-15 LAB — CBC WITH DIFFERENTIAL/PLATELET
Abs Immature Granulocytes: 0.03 10*3/uL (ref 0.00–0.07)
Basophils Absolute: 0 10*3/uL (ref 0.0–0.1)
Basophils Relative: 0 %
Eosinophils Absolute: 0 10*3/uL (ref 0.0–0.5)
Eosinophils Relative: 0 %
HCT: 37.3 % (ref 36.0–46.0)
Hemoglobin: 11.9 g/dL — ABNORMAL LOW (ref 12.0–15.0)
Immature Granulocytes: 0 %
Lymphocytes Relative: 8 %
Lymphs Abs: 0.8 10*3/uL (ref 0.7–4.0)
MCH: 28 pg (ref 26.0–34.0)
MCHC: 31.9 g/dL (ref 30.0–36.0)
MCV: 87.8 fL (ref 80.0–100.0)
Monocytes Absolute: 0.6 10*3/uL (ref 0.1–1.0)
Monocytes Relative: 6 %
Neutro Abs: 8.1 10*3/uL — ABNORMAL HIGH (ref 1.7–7.7)
Neutrophils Relative %: 86 %
Platelets: 324 10*3/uL (ref 150–400)
RBC: 4.25 MIL/uL (ref 3.87–5.11)
RDW: 13.8 % (ref 11.5–15.5)
WBC: 9.5 10*3/uL (ref 4.0–10.5)
nRBC: 0 % (ref 0.0–0.2)

## 2024-03-15 LAB — PRO BRAIN NATRIURETIC PEPTIDE: Pro Brain Natriuretic Peptide: 266 pg/mL (ref <300.0)

## 2024-03-15 MED ORDER — POTASSIUM CHLORIDE CRYS ER 20 MEQ PO TBCR
40.0000 meq | EXTENDED_RELEASE_TABLET | Freq: Once | ORAL | Status: AC
  Administered 2024-03-15: 40 meq via ORAL
  Filled 2024-03-15: qty 2

## 2024-03-15 NOTE — ED Triage Notes (Signed)
 Per husband, patient has been having difficulty walking d/t R foot pain. States slightly swollen. Symptoms started 2 days prior. Hx of dementia. Patient unable/refusing to speak.

## 2024-03-15 NOTE — Discharge Instructions (Signed)
 You were seen in the ER today for concerns of ankle pain/swelling. Your labs and imaging were thankfully normal except for a slightly low potassium level. Your ultrasound was negative for any signs of a blood clot in the leg. There are no fractures or other injuries on xray. I have provided you with an ankle brace for additional support moving around at home. If any concerns for worsening symptoms develop, return to the ER. Otherwise, please follow up with your primary care provider.

## 2024-03-15 NOTE — ED Provider Notes (Signed)
 [name] Provider Note   CSN: [id number] Arrival date & time: 03/15/24  1427     Patient presents with: Foot Pain   Debbie Brandt is a 63 y.o. female.  Patient with past history significant for cognitive impairment, hypertension, hypothyroidism, memory loss, and obesity presents to the emergency department concerns of foot pain.  Patient does not at bedside reports that she has had no appears to be concerns of right foot pain over the last several days as well as swelling to the right lower leg.  No prior history of similar symptoms.  No history of PE or DVT.  Not on anticoagulation.  Patient is largely nonverbal at this time due to her dementia and husband is reporting majority of her concerns.  Spouse denies any recent injuries, falls, or any changes with her home medications.   Foot Pain       Prior to Admission medications   Medication Sig Start Date End Date Taking? Authorizing Provider  atorvastatin  (LIPITOR) 20 MG tablet TAKE 1 TABLET BY MOUTH DAILY 08/18/23   Henson, Vickie L, NP-C  CINNAMON PO Take 1,000 mg by mouth.    [provider]  cyanocobalamin (VITAMIN B12) 1000 MCG tablet Take 1,000 mcg by mouth daily.    [provider]  diclofenac  Sodium (VOLTAREN ) 1 % GEL Apply 4 g topically 4 (four) times daily. 01/15/22   Henson, Vickie L, NP-C  donepezil  (ARICEPT ) 10 MG tablet Take 1 tablet (10 mg total) by mouth at bedtime. 11/19/23   Lomax, Amy, NP  hydrOXYzine  (ATARAX ) 10 MG tablet Take 1 tablet (10 mg total) by mouth at bedtime as needed. 11/04/23   Lomax, Amy, NP  levothyroxine  (SYNTHROID ) 75 MCG tablet TAKE 1 TABLET BY MOUTH EVERY MORNING BEFORE BREAKFAST 02/24/24   Henson, Vickie L, NP-C  loratadine  (CLARITIN ) 10 MG tablet Take 1 tablet (10 mg total) by mouth daily. 06/26/22   Henson, Vickie L, NP-C  losartan -hydrochlorothiazide  (HYZAAR) 50-12.5 MG tablet TAKE 1 TABLET BY MOUTH DAILY 08/18/23   Henson, Vickie L,  NP-C  memantine  (NAMENDA ) 10 MG tablet Take 1 tablet (10 mg total) by mouth 2 (two) times daily. 11/19/23   Lomax, Amy, NP  mirtazapine  (REMERON  SOL-TAB) 15 MG disintegrating tablet PLACE 1 TABLET BY MOUTH EVERY NIGHT AT BEDTIME 02/24/24   Henson, Vickie L, NP-C  omeprazole  (PRILOSEC) 20 MG capsule Take 1 capsule (20 mg total) by mouth daily. 03/09/16   Palumbo, April, MD  ondansetron  (ZOFRAN  ODT) 8 MG disintegrating tablet Take 1 tablet (8 mg total) by mouth every 8 (eight) hours as needed for nausea or vomiting. 02/17/21   Dasie Faden, MD  potassium chloride  SA (KLOR-CON  M) 20 MEQ tablet Take 1 tablet (20 mEq total) by mouth daily. 11/27/22   Henson, Vickie L, NP-C    Allergies: Voltaren  [diclofenac  sodium]    Review of Systems  Cardiovascular:  Positive for leg swelling.  All other systems reviewed and are negative.   Updated Vital Signs BP (!) 152/60 (BP Location: Left Arm)   Pulse 82   Temp 99.9 F (37.7 C) (Oral)   Resp 18   SpO2 97%   Physical Exam Vitals and nursing note reviewed.  Constitutional:      General: She is not in acute distress.    Appearance: She is well-developed.  HENT:     Head: Normocephalic and atraumatic.   Eyes:     Conjunctiva/sclera: Conjunctivae normal.    Cardiovascular:  Rate and Rhythm: Normal rate and regular rhythm.     Heart sounds: No murmur heard. Pulmonary:     Effort: Pulmonary effort is normal. No respiratory distress.     Breath sounds: Normal breath sounds.  Abdominal:     Palpations: Abdomen is soft.     Tenderness: There is no abdominal tenderness.   Musculoskeletal:        General: Swelling present. No deformity or signs of injury.     Cervical back: Neck supple.     Right lower leg: No edema.     Left lower leg: No edema.     Comments: Right foot with swelling present towards the ankle which is notably more swollen compared to the left.  Right calf without any visible swelling or tenderness.   Skin:    General: Skin  is warm and dry.     Capillary Refill: Capillary refill takes less than 2 seconds.     Comments: Skin of the right foot is warm to the touch but no appreciable erythema or induration.   Neurological:     Mental Status: She is alert.   Psychiatric:        Mood and Affect: Mood normal.     (all labs ordered are listed, but only abnormal results are displayed) Labs Reviewed  CBC WITH DIFFERENTIAL/PLATELET - Abnormal; Notable for the following components:      Result Value   Hemoglobin 11.9 (*)    Neutro Abs 8.1 (*)    All other components within normal limits  COMPREHENSIVE METABOLIC PANEL WITH GFR - Abnormal; Notable for the following components:   Potassium 2.9 (*)    Glucose, Bld 115 (*)    Creatinine, Ser 1.09 (*)    GFR, Estimated 56 (*)    All other components within normal limits  PRO BRAIN NATRIURETIC PEPTIDE    EKG: None  Radiology: US  Venous Img Lower Right (DVT Study) Result Date: 03/15/2024 CLINICAL DATA:  Right foot pain x2 days. EXAM: RIGHT LOWER EXTREMITY VENOUS DOPPLER ULTRASOUND TECHNIQUE: Gray-scale sonography with graded compression, as well as color Doppler and duplex ultrasound were performed to evaluate the lower extremity deep venous systems from the level of the common femoral vein and including the common femoral, femoral, profunda femoral, popliteal and calf veins including the posterior tibial, peroneal and gastrocnemius veins when visible. The superficial great saphenous vein was also interrogated. Spectral Doppler was utilized to evaluate flow at rest and with distal augmentation maneuvers in the common femoral, femoral and popliteal veins. COMPARISON:  None Available. FINDINGS: Contralateral Common Femoral Vein: The LEFT common femoral vein could not be evaluated secondary to study limitations (see below). Common Femoral Vein: The RIGHT common femoral vein could not be evaluated secondary to study limitations (see below). Saphenofemoral Junction: The RIGHT  saphenofemoral junction cannot be evaluated secondary to study limitations (see below) Profunda Femoral Vein: No evidence of thrombus. Normal compressibility and flow on color Doppler imaging. Femoral Vein: No evidence of thrombus. Normal compressibility, respiratory phasicity and response to augmentation. Popliteal Vein: No evidence of thrombus. Normal compressibility, respiratory phasicity and response to augmentation. Calf Veins: No evidence of thrombus. Normal compressibility and flow on color Doppler imaging. Superficial Great Saphenous Vein: No evidence of thrombus. Normal compressibility. Venous Reflux:  None. Other Findings: The study is technically limited secondary to the patient's nonverbal status and patient guarding of the proximal portions of the bilateral lower extremities. IMPRESSION: Limited evaluation of the RIGHT common femoral vein and RIGHT saphenofemoral  junction, without evidence of deep venous thrombosis within the RIGHT lower extremity. Electronically Signed   By: Suzen Dials M.D.   On: 03/15/2024 18:19   DG Tibia/Fibula Right Result Date: 03/15/2024 CLINICAL DATA:  Pain EXAM: RIGHT TIBIA AND FIBULA - 2 VIEW COMPARISON:  None Available. FINDINGS: There is no evidence of fracture or other focal bone lesions. Soft tissues are unremarkable. IMPRESSION: Negative. Electronically Signed   By: Luke Bun M.D.   On: 03/15/2024 16:14   DG Foot Complete Right Result Date: 03/15/2024 CLINICAL DATA:  Pain EXAM: RIGHT FOOT COMPLETE - 3+ VIEW COMPARISON:  None Available. FINDINGS: No fracture or malalignment. Moderate bunion deformity head of first metatarsal. Mild degenerative change at the first MTP joint with hallux valgus deformity. Small plantar calcaneal spur IMPRESSION: 1. No acute osseous abnormality. 2. Hallux valgus deformity first digit with bunion deformity head of first metatarsal. Electronically Signed   By: Luke Bun M.D.   On: 03/15/2024 16:14     Procedures    Medications Ordered in the ED  potassium chloride  SA (KLOR-CON  M) CR tablet 40 mEq (40 mEq Oral Given 03/15/24 1716)                                    Medical Decision Making Amount and/or Complexity of Data Reviewed Labs: ordered. Radiology: ordered.  Risk Prescription drug management.   This patient presents to the ED for concern of foot pain, swelling, this involves an extensive number of treatment options, and is a complaint that carries with it a high risk of complications and morbidity.  The differential diagnosis includes cellulitis, DVT, CHF, abscess, ankle sprain   Co morbidities that complicate the patient evaluation  Dementia, cognitive impairment, obesity, hypertension, hypothyroidism   Lab Tests:  I Ordered, and personally interpreted labs.  The pertinent results include: CBC markable, CMP mild hypokalemia 3.9, GFR slightly decreased at 56 partially secondary to mild dehydration, proBNP unremarkable at 266   Imaging Studies ordered:  I ordered imaging studies including x-rays of the right foot, right tibia/fibula, right lower extremity ultrasound I independently visualized and interpreted imaging which showed x-ray imaging negative for any acute findings.  Ultrasound of the right lower extremity with no signs of DVT. I agree with the radiologist interpretation   Consultations Obtained:  I requested consultation with none,  and discussed lab and imaging findings as well as pertinent plan - they recommend: N/A   Problem List / ED Course / Critical interventions / Medication management  Patient with past history significant for dementia, cognitive impairment presents the emergency department with concerns of right foot pain and swelling.  Husband reports this been ongoing for the last 2 or 3 days.  No reported injuries as far as he is aware.  No history of similar symptoms.  No fevers, chills or bodyaches at home.  Patient is not currently anticoagulated. On  exam, patient does have some slight swelling towards the right foot/ankle compared to the left.  The skin is warm to the touch but minimal erythema.  No skin induration or areas of fluctuance seen.  Calf is nontender.  Negative Homans' sign. Based on exam, will add on ultrasound imaging of the right lower extremity as well as basic labs including BNP.  Doubtful of CHF given no reported feelings of chest pain or shortness of breath but could be source of swelling that will be evaluated.  Highest  concern for possible DVT. X-ray imaging negative for any acute findings.  Basic labs also reassuring with no signs of fluid retention secondary to CHF.  Mild hypokalemia at 2.9. Ultrasound of the right lower extremity with negative findings for DVT.  Unclear cause of patient's ankle swelling that she was presented with.  Possible ankle sprain as patient cannot verbally tell me exactly what has happened to her.  Will place her into an ASO lace up brace for additional support.  Encouraged close follow-up with PCP.  Otherwise stable at this time for outpatient follow-up and discharged home. I ordered medication including potassium for hypokalemia Reevaluation of the patient after these medicines showed that the patient improved I have reviewed the patients home medicines and have made adjustments as needed   Social Determinants of Health:  Cognitive impairment   Test / Admission - Considered:  Patient not meeting criteria for inpatient admission.  Final diagnoses:  Acute right ankle pain  Hypokalemia    ED Discharge Orders     None          Cecily Legrand DELENA DEVONNA 03/15/24 1924    Towana Ozell BROCKS, MD 03/16/24 1012

## 2024-03-16 DIAGNOSIS — S93691A Other sprain of right foot, initial encounter: Secondary | ICD-10-CM | POA: Diagnosis not present

## 2024-03-18 ENCOUNTER — Encounter: Payer: Self-pay | Admitting: Family Medicine

## 2024-03-18 ENCOUNTER — Ambulatory Visit (INDEPENDENT_AMBULATORY_CARE_PROVIDER_SITE_OTHER): Payer: Self-pay | Admitting: Family Medicine

## 2024-03-18 ENCOUNTER — Other Ambulatory Visit

## 2024-03-18 VITALS — BP 138/66 | HR 83 | Temp 97.8°F | Ht 63.0 in | Wt 165.0 lb

## 2024-03-18 DIAGNOSIS — R944 Abnormal results of kidney function studies: Secondary | ICD-10-CM

## 2024-03-18 DIAGNOSIS — I10 Essential (primary) hypertension: Secondary | ICD-10-CM

## 2024-03-18 DIAGNOSIS — F03C0 Unspecified dementia, severe, without behavioral disturbance, psychotic disturbance, mood disturbance, and anxiety: Secondary | ICD-10-CM | POA: Diagnosis not present

## 2024-03-18 DIAGNOSIS — E876 Hypokalemia: Secondary | ICD-10-CM

## 2024-03-18 DIAGNOSIS — E039 Hypothyroidism, unspecified: Secondary | ICD-10-CM

## 2024-03-18 DIAGNOSIS — R600 Localized edema: Secondary | ICD-10-CM

## 2024-03-18 DIAGNOSIS — R7303 Prediabetes: Secondary | ICD-10-CM

## 2024-03-18 DIAGNOSIS — K219 Gastro-esophageal reflux disease without esophagitis: Secondary | ICD-10-CM | POA: Insufficient documentation

## 2024-03-18 LAB — COMPREHENSIVE METABOLIC PANEL WITH GFR
ALT: 22 U/L (ref 0–35)
AST: 20 U/L (ref 0–37)
Albumin: 3.9 g/dL (ref 3.5–5.2)
Alkaline Phosphatase: 88 U/L (ref 39–117)
BUN: 12 mg/dL (ref 6–23)
CO2: 34 meq/L — ABNORMAL HIGH (ref 19–32)
Calcium: 9 mg/dL (ref 8.4–10.5)
Chloride: 102 meq/L (ref 96–112)
Creatinine, Ser: 0.97 mg/dL (ref 0.40–1.20)
GFR: 61.42 mL/min (ref >60.00)
Glucose, Bld: 102 mg/dL — ABNORMAL HIGH (ref 70–99)
Potassium: 3.2 meq/L — ABNORMAL LOW (ref 3.5–5.1)
Sodium: 142 meq/L (ref 135–145)
Total Bilirubin: 0.4 mg/dL (ref 0.2–1.2)
Total Protein: 7 g/dL (ref 6.0–8.3)

## 2024-03-18 LAB — CBC WITH DIFFERENTIAL/PLATELET
Basophils Absolute: 0.1 10*3/uL (ref 0.0–0.1)
Basophils Relative: 1.2 % (ref 0.0–3.0)
Eosinophils Absolute: 0 10*3/uL (ref 0.0–0.7)
Eosinophils Relative: 0.4 % (ref 0.0–5.0)
HCT: 35.3 % — ABNORMAL LOW (ref 36.0–46.0)
Hemoglobin: 11.5 g/dL — ABNORMAL LOW (ref 12.0–15.0)
Lymphocytes Relative: 25.2 % (ref 12.0–46.0)
Lymphs Abs: 1.7 10*3/uL (ref 0.7–4.0)
MCHC: 32.7 g/dL (ref 30.0–36.0)
MCV: 83.6 fl (ref 78.0–100.0)
Monocytes Absolute: 0.4 10*3/uL (ref 0.1–1.0)
Monocytes Relative: 6 % (ref 3.0–12.0)
Neutro Abs: 4.6 10*3/uL (ref 1.4–7.7)
Neutrophils Relative %: 67.2 % (ref 43.0–77.0)
Platelets: 363 10*3/uL (ref 150.0–400.0)
RBC: 4.22 Mil/uL (ref 3.87–5.11)
RDW: 14 % (ref 11.5–15.5)
WBC: 6.8 10*3/uL (ref 4.0–10.5)

## 2024-03-18 LAB — HEMOGLOBIN A1C: Hgb A1c MFr Bld: 5.7 % (ref 4.6–6.5)

## 2024-03-18 LAB — TSH: TSH: 3.51 u[IU]/mL (ref 0.35–5.50)

## 2024-03-18 MED ORDER — POTASSIUM CHLORIDE CRYS ER 20 MEQ PO TBCR: 20.0000 meq | EXTENDED_RELEASE_TABLET | Freq: Every day | ORAL | 0 refills | Status: DC

## 2024-03-18 NOTE — Progress Notes (Signed)
 Subjective:     Patient ID: Debbie Brandt, female    DOB: 1960-10-05, 63 y.o.   MRN: [id number]  Chief Complaint  Patient presents with   Medical Management of Chronic Issues    Right foot can't really walk on it sometimes due to pain, went to ED recently     HPI  History of Present Illness         She is here with her brother who is her caretaker. Here to follow up on chronic health conditions including HTN, hypothyroidism, prediabetes, CKD.   Recent visit to the ED for ankle pain which seems to be improving.   Potassium was low. She was given potassium chloride   40 MEQ one dose.  On hydrochlorothiazide .   C/o bilateral LE edema, right worse than left. DVT ruled out in the ED. X rays of right foot and right tib/fib negative for acute findings.  No concern for CHF in the ED.   Under the care of neurology for severe dementia. She is mostly nonverbal today.      Health Maintenance Due  Topic Date Due   Medicare Annual Wellness (AWV)  Never done   Hepatitis C Screening  Never done   Colonoscopy  Never done   Pneumococcal Vaccine: 50+ Years (1 of 1 - PCV) Never done   Zoster Vaccines- Shingrix (1 of 2) 12/03/2008   MAMMOGRAM  05/08/2014   Cervical Cancer Screening (HPV/Pap Cotest)  07/29/2022   COVID-19 Vaccine (3 - 2024-25 season) 05/18/2023   DEXA SCAN  Never done    Past Medical History:  Diagnosis Date   Chest pain 09/26/2015   Depression    Essential hypertension 09/26/2015   GERD (gastroesophageal reflux disease)    Hypertension    Hypothyroidism 09/26/2015   Murmur 09/26/2015   Thyroid disease     Past Surgical History:  Procedure Laterality Date   ABDOMINAL HYSTERECTOMY     CESAREAN SECTION     LAPAROSCOPIC ABDOMINAL EXPLORATION N/A 11/21/2017   Procedure: LAPAROSCOPIC ABDOMINAL EXPLORATION LYSIS OF ADHESIONS;  Surgeon: Aron Shoulders, MD;  Location: [facility];  Service: General;  Laterality: N/A;   LAPAROTOMY N/A 10/28/2016   Procedure: EXPLORATORY  LAPAROTOMY WITH  LYSIS OF ADHESIONS;  Surgeon: Krystal Russell, MD;  Location: [facility];  Service: General;  Laterality: N/A;    Family History  Problem Relation Age of Onset   Cancer Mother        lung cancer   Hypertension Mother    Cancer Father    Heart attack Father    Mental illness Sister    Hypertension Sister    Hyperlipidemia Sister    Dementia Sister    Diabetes Brother    Hypertension Brother     Social History   Socioeconomic History   Marital status: Widowed    Spouse name: Not on file   Number of children: Not on file   Years of education: Not on file   Highest education level: Not on file  Occupational History   Not on file  Tobacco Use   Smoking status: Never   Smokeless tobacco: Never  Vaping Use   Vaping status: Never Used  Substance and Sexual Activity   Alcohol use: No    Alcohol/week: 0.0 standard drinks of alcohol   Drug use: No   Sexual activity: Not Currently    Comment: 1st intercourse- 15, partners3, widow  Other Topics Concern   Not on file  Social History Narrative   Epworth  Sleepiness Scale = 6 (as of 09/26/2015)   Social Drivers of Corporate investment banker Strain: Not on file  Food Insecurity: Not on file  Transportation Needs: Not on file  Physical Activity: Not on file  Stress: Not on file  Social Connections: Not on file  Intimate Partner Violence: Not on file    Outpatient Medications Prior to Visit  Medication Sig Dispense Refill   atorvastatin  (LIPITOR) 20 MG tablet TAKE 1 TABLET BY MOUTH DAILY 90 tablet 1   CINNAMON PO Take 1,000 mg by mouth.     cyanocobalamin (VITAMIN B12) 1000 MCG tablet Take 1,000 mcg by mouth daily.     donepezil  (ARICEPT ) 10 MG tablet Take 1 tablet (10 mg total) by mouth at bedtime. 90 tablet 3   levothyroxine  (SYNTHROID ) 75 MCG tablet TAKE 1 TABLET BY MOUTH EVERY MORNING BEFORE BREAKFAST 30 tablet 0   losartan -hydrochlorothiazide  (HYZAAR) 50-12.5 MG tablet TAKE 1 TABLET BY MOUTH DAILY 90 tablet  1   memantine  (NAMENDA ) 10 MG tablet Take 1 tablet (10 mg total) by mouth 2 (two) times daily. 180 tablet 3   mirtazapine  (REMERON  SOL-TAB) 15 MG disintegrating tablet PLACE 1 TABLET BY MOUTH EVERY NIGHT AT BEDTIME 30 tablet 0   diclofenac  Sodium (VOLTAREN ) 1 % GEL Apply 4 g topically 4 (four) times daily. (Patient not taking: Reported on 03/18/2024) 150 g 1   loratadine  (CLARITIN ) 10 MG tablet Take 1 tablet (10 mg total) by mouth daily. (Patient not taking: Reported on 03/18/2024) 30 tablet 2   omeprazole  (PRILOSEC) 20 MG capsule Take 1 capsule (20 mg total) by mouth daily. (Patient not taking: Reported on 03/18/2024) 30 capsule 0   ondansetron  (ZOFRAN  ODT) 8 MG disintegrating tablet Take 1 tablet (8 mg total) by mouth every 8 (eight) hours as needed for nausea or vomiting. (Patient not taking: Reported on 03/18/2024) 20 tablet 0   hydrOXYzine  (ATARAX ) 10 MG tablet Take 1 tablet (10 mg total) by mouth at bedtime as needed. (Patient not taking: Reported on 03/18/2024) 90 tablet 1   potassium chloride  SA (KLOR-CON  M) 20 MEQ tablet Take 1 tablet (20 mEq total) by mouth daily. (Patient not taking: Reported on 03/18/2024) 7 tablet 0   No facility-administered medications prior to visit.    Allergies  Allergen Reactions   Voltaren  [Diclofenac  Sodium] Other (See Comments)    Unknown, pt went to pick up at pharmacy and they said she was allergic    Review of Systems  Constitutional:        Unable to complete due to severe dementia.        Objective:    Physical Exam Constitutional:      General: She is not in acute distress.    Appearance: She is not ill-appearing.  HENT:     Mouth/Throat:     Mouth: Mucous membranes are moist.  Eyes:     Extraocular Movements: Extraocular movements intact.     Conjunctiva/sclera: Conjunctivae normal.  Cardiovascular:     Rate and Rhythm: Normal rate.  Pulmonary:     Effort: Pulmonary effort is normal.  Musculoskeletal:     Cervical back: Normal range of  motion and neck supple.     Right lower leg: 1+ Edema present.     Left lower leg: Edema present.     Comments: Trace edema on left, 1+ on right up to mid shin. Soft, non tender calves. Pulses palpable with good cap refill. No sign of infection.   Skin:  General: Skin is warm and dry.  Neurological:     Mental Status: She is alert. Mental status is at baseline.      BP 138/66 (BP Location: Left Arm, Patient Position: Sitting)   Pulse 83   Temp 97.8 F (36.6 C) (Temporal)   Ht 5' 3 (1.6 m)   Wt 165 lb (74.8 kg)   SpO2 98%   BMI 29.23 kg/m  Wt Readings from Last 3 Encounters:  03/18/24 165 lb (74.8 kg)  11/19/23 168 lb (76.2 kg)  02/06/23 166 lb (75.3 kg)       Assessment & Plan:   Problem List Items Addressed This Visit     Hypothyroidism (Chronic)   Relevant Orders   TSH (Completed)   HTN (hypertension)   Relevant Orders   CBC with Differential/Platelet (Completed)   Comprehensive metabolic panel with GFR (Completed)   TSH (Completed)   Hypokalemia - Primary   Relevant Medications   potassium chloride  SA (KLOR-CON  M) 20 MEQ tablet   Other Relevant Orders   Comprehensive metabolic panel with GFR (Completed)   Other Visit Diagnoses       Bilateral leg edema         Severe dementia without behavioral disturbance, psychotic disturbance, mood disturbance, or anxiety, unspecified dementia type (HCC)         Prediabetes       Relevant Orders   CBC with Differential/Platelet (Completed)   Comprehensive metabolic panel with GFR (Completed)   Hemoglobin A1c (Completed)     Decreased GFR       Relevant Orders   Comprehensive metabolic panel with GFR (Completed)      She is mostly nonverbal. Denies pain when asked. Brother is her caretaker and is the historian. Reviewed notes and results from the ED.  Exam shows mild edema without concern for DVT or cellulitis.  Counseling on elevating her legs since she is quite sedentary and reducing sodium in her diet. Her  brother is in charge of her food.  Rechecked potassium level and it is still low at 3.2. I will have her take K-Dur 20 MEQ x 7 days and then switch to K-Dur 10 MEQ going forward since she is on hydrochlorothiazide . Consider changing medication at her follow up and pending renal function.  Brother is concerned about her being able to swallow large pills. Advised he can crush potassium tablets and put it in applesauce.  Follow up pending A1c, renal function and continue current levothyroxine  for now. They will also follow up if edema is worsening.    I have discontinued Caidance L. Rochelle's hydrOXYzine . I am also having her maintain her omeprazole , ondansetron , diclofenac  Sodium, loratadine , cyanocobalamin, CINNAMON PO, losartan -hydrochlorothiazide , atorvastatin , donepezil , memantine , levothyroxine , mirtazapine , and potassium chloride  SA.  Meds ordered this encounter  Medications   potassium chloride  SA (KLOR-CON  M) 20 MEQ tablet    Sig: Take 1 tablet (20 mEq total) by mouth daily.    Dispense:  7 tablet    Refill:  0

## 2024-03-18 NOTE — Patient Instructions (Signed)
 Please go to the [facility] lab located at [address]   Take the elevator to level B for labs.   Turn right on [address] Turn left at the first light on [address] Turn right on [address] The building will be on your left across from [facility] Emergency Department

## 2024-03-22 ENCOUNTER — Ambulatory Visit: Payer: Self-pay | Admitting: Family Medicine

## 2024-03-22 ENCOUNTER — Other Ambulatory Visit: Payer: Self-pay | Admitting: Family Medicine

## 2024-03-22 NOTE — Progress Notes (Signed)
 Please let her brother know that her potassium is still on the low side.  She should have restarted her daily potassium supplement by now.  Did they start this?  Her labs are stable otherwise.

## 2024-03-22 NOTE — Progress Notes (Signed)
 Ask him if he can crush the pill and put it in applesauce or yogurt or something she likes to eat. If he thinks this will work, have him use the 7 days of potassium at the 20 MEQ dose and then we will send in a daily 10 MEQ dose for continuous use. I could not prescribe a liquid version.

## 2024-03-23 ENCOUNTER — Other Ambulatory Visit: Payer: Self-pay | Admitting: Family Medicine

## 2024-03-23 DIAGNOSIS — F419 Anxiety disorder, unspecified: Secondary | ICD-10-CM

## 2024-03-23 DIAGNOSIS — R630 Anorexia: Secondary | ICD-10-CM

## 2024-03-23 MED ORDER — TRIAMTERENE-HCTZ 37.5-25 MG PO CAPS: 1.0000 | ORAL_CAPSULE | Freq: Every day | ORAL | 0 refills | Status: DC

## 2024-03-23 MED ORDER — LOSARTAN POTASSIUM 50 MG PO TABS: 50.0000 mg | ORAL_TABLET | Freq: Every day | ORAL | 1 refills | Status: AC

## 2024-03-24 ENCOUNTER — Ambulatory Visit: Payer: Self-pay

## 2024-03-24 NOTE — Telephone Encounter (Signed)
 Pt brother calling to get clarification on medications. RN advised Lynwood to call back if he has more questions.   Copied from CRM [id number]. Topic: General - Other >> Mar 24, 2024  3:36 PM Robinson H wrote: Reason for CRM: Patients brother returning call to clinic for Chiquita, reached out to [employer] agent was advised that Chiquita is gone for the day and to relay message to patient.

## 2024-03-25 ENCOUNTER — Telehealth: Payer: Self-pay | Admitting: Family Medicine

## 2024-03-25 NOTE — Telephone Encounter (Signed)
 Copied from CRM [id number]. Topic: General - Other >> Mar 25, 2024 11:47 AM [place] J wrote: Patient's brother Sydnee Law) is wondering if the patient is supposed to stop taking losartan  (COZAAR ) 50 MG tablet & levothyroxine  (SYNTHROID ) 75 MCG tablet. Please call back at [phone]

## 2024-03-25 NOTE — Telephone Encounter (Signed)
 Called pt brother and was able to answer medication questions and go over instructions on what is to be discontinued and what is in its place. Lynwood understood and will call back w any other questions if needed

## 2024-04-13 ENCOUNTER — Telehealth: Payer: Self-pay | Admitting: Pharmacist

## 2024-04-13 DIAGNOSIS — E785 Hyperlipidemia, unspecified: Secondary | ICD-10-CM

## 2024-04-13 MED ORDER — ATORVASTATIN CALCIUM 20 MG PO TABS: ORAL_TABLET | ORAL | 1 refills | Status: DC

## 2024-04-13 NOTE — Progress Notes (Signed)
 Pharmacy Quality Measure Review  This patient is appearing on a report for being at risk of failing the adherence measure for cholesterol (statin) and hypertension (ACEi/ARB) medications this calendar year.   Medication: losartan /HCTZ Last fill date: 11/17/23 for 90 day supply  Medication: atorvastatin  20 mg Last fill date: 11/17/23 for 90 day supply  Spoke to patient's brother, Lynwood, regarding atorvastatin . He is not at home and unable to check medications but notes he gives her medications every day. Noted that all other medications are up to date and atorvastatin  is the only one overdue and has 0 remaining refills. Refill was requested in June but was denied due to pt needing an appt. Pt did have recent appt with PCP and instructed to continue atorvstatin. Will send refill.  Losartan /hydrochlorothiazide  was changed to losartan  50 mg daily and triamterene /hydrochlorothiazide  due to hypokalemia - current fills are up to date. No action needed.  Darrelyn Drum, PharmD, BCPS, CPP Clinical Pharmacist Practitioner [facility] [phone]

## 2024-05-03 ENCOUNTER — Telehealth: Payer: Self-pay

## 2024-05-03 NOTE — Telephone Encounter (Signed)
 Copied from CRM [id number]. Topic: Clinical - Medication Question >> May 03, 2024  2:24 PM Shereese L wrote: Reason for CRM: patient brother is calling and stated that the patient doesn't want to swallow the medication and wanted to know if he can crush the pills up to insure that she's taking the medicine Please call brother lynwood back [phone]

## 2024-05-04 NOTE — Telephone Encounter (Signed)
 LM for Lynwood it is ok to crush or empty contents of capsules into pt food. Provided office number for questions/concerns.

## 2024-05-26 ENCOUNTER — Encounter: Admitting: Family Medicine

## 2024-06-08 NOTE — Progress Notes (Unsigned)
 No chief complaint on file.   HISTORY OF PRESENT ILLNESS:  06/08/24 ALL:  Debbie Brandt returns for follow up for dementia. She continues donepezil  and memantine . No significant changes. She is not as active. She has gained some weight. Appetite is good. Sleeping well. No behavioral concerns. No falls.   11/19/2023 ALL:  Debbie Brandt returns for follow up for dementia. She continues donepezil  and memantine . NO significant changes. She is not as active. She has gained some weight. Appetite is good. Sleeping well. No behavioral concerns. No falls.   02/06/2023 ALL: Debbie Brandt returns for follow up for dementia. She was last seen 09/2022. We continued donepezil  and added memantine . She has tolerated meds well. No significant changes. She continues to have progressive memory loss but seems fairly stable from last visit. She continues to wonder at night. Debbie Brandt isn't overly bothered as she seems to stay in her rooom but when her cousin picks her up on the weekends, she keeps her up all night. They have tried sleep aids OTC that are not effective. She seems ot be eating normally. She likes hot dogs and chicken. Likes to eat sweets. Hallucinations are stable. She talks to people who aren't there. Mood seems stable. She does get sad from time to time and will cry. She continues mirtazapine  through PCP.   10/03/2022 ALL: Debbie Brandt is a 63 y.o. female here today for follow up for dementia. She was last seen by Dr Buck 06/2022 and brother reported progressive worsening. Donepezil  increased to 10mg  daily. She seems to be tolerating it well. Her brother reports that he has seen progressive decline. She is up all hours of the night. She seems to wonder all over the house. He has had to put up a baby gate to keep her form wondering. He has been giving her Benadryl  at bedtime that has seemed to help. She prefers junk foods. No trouble swallowing. Hallucinations are about the same. Her cousin comes by on the weekends to help  her get ready for church.   HISTORY (copied from Dr Obie previous note)  Debbie Brandt is a 63 year old right-handed woman with an underlying medical history of hypertension, hypothyroidism, reflux disease, hyperlipidemia, kidney stones, depression, and overweight state, who presents for follow up consultation of her memory loss. The patient is accompanied by her brother again today and presents after a longer gap of over 18 months. She missed an appointment in the interim with Greig Forbes, NP on 05/22/21. I last saw her on 12/19/2020, at which time she reported feeling stable.  She had stopped taking her amlodipine for unclear reasons and unknown time.  We talked about her neuropsychological evaluation and recommendations.  She was advised to start low-dose donepezil  5 mg daily.     Today, 07/04/2022: She reports very little of her own history, she is unable to provide her history and history is provided by her brother.  She is able to answer a few questions but only with 1 or 2 word sentences.  Her brother reports that she has become worse over time, she is more forgetful, she sometimes talks to be able to passed on.  She has had visual and auditory hallucinations, some days are better than others.  She has a reasonable appetite, she has not been driving.  She no longer works.  She has not fallen.  He tries to push her water intake, she also likes to drink soda but he tries to limit it.  She has had no behavioral  escalations, she has occasional left knee pain.    REVIEW OF SYSTEMS: Out of a complete 14 system review of symptoms, the patient complains only of the following symptoms, memory loss, hallucinations and all other reviewed systems are negative.   ALLERGIES: Allergies  Allergen Reactions   Voltaren  [Diclofenac  Sodium] Other (See Comments)    Unknown, pt went to pick up at pharmacy and they said she was allergic     HOME MEDICATIONS: Outpatient Medications Prior to Visit  Medication Sig  Dispense Refill   atorvastatin  (LIPITOR) 20 MG tablet TAKE 1 TABLET BY MOUTH DAILY. 90 tablet 1   CINNAMON PO Take 1,000 mg by mouth.     cyanocobalamin (VITAMIN B12) 1000 MCG tablet Take 1,000 mcg by mouth daily.     diclofenac  Sodium (VOLTAREN ) 1 % GEL Apply 4 g topically 4 (four) times daily. (Patient not taking: Reported on 03/18/2024) 150 g 1   donepezil  (ARICEPT ) 10 MG tablet Take 1 tablet (10 mg total) by mouth at bedtime. 90 tablet 3   levothyroxine  (SYNTHROID ) 75 MCG tablet TAKE 1 TABLET BY MOUTH EVERY MORNING BEFORE BREAKFAST 90 tablet 1   loratadine  (CLARITIN ) 10 MG tablet Take 1 tablet (10 mg total) by mouth daily. (Patient not taking: Reported on 03/18/2024) 30 tablet 2   losartan  (COZAAR ) 50 MG tablet Take 1 tablet (50 mg total) by mouth daily. 90 tablet 1   memantine  (NAMENDA ) 10 MG tablet Take 1 tablet (10 mg total) by mouth 2 (two) times daily. 180 tablet 3   mirtazapine  (REMERON  SOL-TAB) 15 MG disintegrating tablet DISSOLVE 1 TABLET ON TONGUE EVERY NIGHT AT BEDTIME 90 tablet 1   omeprazole  (PRILOSEC) 20 MG capsule Take 1 capsule (20 mg total) by mouth daily. (Patient not taking: Reported on 03/18/2024) 30 capsule 0   ondansetron  (ZOFRAN  ODT) 8 MG disintegrating tablet Take 1 tablet (8 mg total) by mouth every 8 (eight) hours as needed for nausea or vomiting. (Patient not taking: Reported on 03/18/2024) 20 tablet 0   triamterene -hydrochlorothiazide  (DYAZIDE ) 37.5-25 MG capsule Take 1 each (1 capsule total) by mouth daily. 90 capsule 0   No facility-administered medications prior to visit.     PAST MEDICAL HISTORY: Past Medical History:  Diagnosis Date   Chest pain 09/26/2015   Depression    Essential hypertension 09/26/2015   GERD (gastroesophageal reflux disease)    Hypertension    Hypothyroidism 09/26/2015   Murmur 09/26/2015   Thyroid disease      PAST SURGICAL HISTORY: Past Surgical History:  Procedure Laterality Date   ABDOMINAL HYSTERECTOMY     CESAREAN SECTION      LAPAROSCOPIC ABDOMINAL EXPLORATION N/A 11/21/2017   Procedure: LAPAROSCOPIC ABDOMINAL EXPLORATION LYSIS OF ADHESIONS;  Surgeon: Aron Shoulders, MD;  Location: [facility];  Service: General;  Laterality: N/A;   LAPAROTOMY N/A 10/28/2016   Procedure: EXPLORATORY LAPAROTOMY WITH  LYSIS OF ADHESIONS;  Surgeon: Krystal Russell, MD;  Location: [facility];  Service: General;  Laterality: N/A;     FAMILY HISTORY: Family History  Problem Relation Age of Onset   Cancer Mother        lung cancer   Hypertension Mother    Cancer Father    Heart attack Father    Mental illness Sister    Hypertension Sister    Hyperlipidemia Sister    Dementia Sister    Diabetes Brother    Hypertension Brother      SOCIAL HISTORY: Social History   Socioeconomic History   Marital status:  Widowed    Spouse name: Not on file   Number of children: Not on file   Years of education: Not on file   Highest education level: Not on file  Occupational History   Not on file  Tobacco Use   Smoking status: Never   Smokeless tobacco: Never  Vaping Use   Vaping status: Never Used  Substance and Sexual Activity   Alcohol use: No    Alcohol/week: 0.0 standard drinks of alcohol   Drug use: No   Sexual activity: Not Currently    Comment: 1st intercourse- 15, partners3, widow  Other Topics Concern   Not on file  Social History Narrative   Epworth Sleepiness Scale = 6 (as of 09/26/2015)   Social Drivers of Corporate investment banker Strain: Not on file  Food Insecurity: Not on file  Transportation Needs: Not on file  Physical Activity: Not on file  Stress: Not on file  Social Connections: Not on file  Intimate Partner Violence: Not on file     PHYSICAL EXAM  There were no vitals filed for this visit.    There is no height or weight on file to calculate BMI.  Generalized: Well developed, in no acute distress  Cardiology: normal rate and rhythm, no murmur auscultated  Respiratory: clear to auscultation  bilaterally    Neurological examination  Mentation: Alert, not oriented to time, place, or history taking. Follows most commands speech and language fluent Cranial nerve II-XII: Pupils were equal round reactive to light. Extraocular movements were full, visual field were full on confrontational test. Facial sensation and strength were normal. Uvula tongue midline. Head turning and shoulder shrug  were normal and symmetric. Motor: The motor testing reveals 5 over 5 strength of all 4 extremities. Good symmetric motor tone is noted throughout.  Gait and station: Gait is normal.    DIAGNOSTIC DATA (LABS, IMAGING, TESTING) - I reviewed patient records, labs, notes, testing and imaging myself where available.  Lab Results  Component Value Date   WBC 6.8 03/18/2024   HGB 11.5 (L) 03/18/2024   HCT 35.3 (L) 03/18/2024   MCV 83.6 03/18/2024   PLT 363.0 03/18/2024      Component Value Date/Time   NA 142 03/18/2024 1552   K 3.2 (L) 03/18/2024 1552   CL 102 03/18/2024 1552   CO2 34 (H) 03/18/2024 1552   GLUCOSE 102 (H) 03/18/2024 1552   BUN 12 03/18/2024 1552   CREATININE 0.97 03/18/2024 1552   CREATININE 0.83 09/26/2015 0955   CALCIUM  9.0 03/18/2024 1552   PROT 7.0 03/18/2024 1552   ALBUMIN  3.9 03/18/2024 1552   AST 20 03/18/2024 1552   ALT 22 03/18/2024 1552   ALKPHOS 88 03/18/2024 1552   BILITOT 0.4 03/18/2024 1552   GFRNONAA 56 (L) 03/15/2024 1620   GFRAA >60 11/26/2017 0504   Lab Results  Component Value Date   CHOL 177 04/02/2022   HDL 53.70 04/02/2022   LDLCALC 113 (H) 04/02/2022   TRIG 55.0 04/02/2022   CHOLHDL 3 04/02/2022   Lab Results  Component Value Date   HGBA1C 5.7 03/18/2024   Lab Results  Component Value Date   VITAMINB12 >1500 (H) 11/26/2022   Lab Results  Component Value Date   TSH 3.51 03/18/2024       02/06/2023    3:18 PM 04/04/2020   10:24 AM  MMSE - Mini Mental State Exam  Orientation to time 0 2  Orientation to Place 2 4  Registration 0  3   Attention/ Calculation 0 5  Recall 0 0  Language- name 2 objects 2 2  Language- repeat 1 1  Language- follow 3 step command 0 3  Language- read & follow direction 0 1  Write a sentence 0 1  Copy design 0 0  Total score 5 22        No data to display               No data to display           ASSESSMENT AND PLAN  63 y.o. year old female  has a past medical history of Chest pain (09/26/2015), Depression, Essential hypertension (09/26/2015), GERD (gastroesophageal reflux disease), Hypertension, Hypothyroidism (09/26/2015), Murmur (09/26/2015), and Thyroid disease. here with    No diagnosis found.  WAYNETTA METHENY has continued to have progressive cognitive decline. She is tolerating donepezil  with no worsening of hallucinations. We will continue 10mg  daily and memantine   10mg  BID. She is sleeping better. May continue hydroxyzine  as needed. I have discussed progressive nature of disease with her brother, Debbie Brandt. Caregiver resources discussed. Consider [facility]. Healthy lifestyle habits encouraged. She will follow up with PCP as directed. She will return to see me in 6-8 months, sooner if needed. She verbalizes understanding and agreement with this plan.   No orders of the defined types were placed in this encounter.    No orders of the defined types were placed in this encounter.   I spent 30 minutes of face-to-face and non-face-to-face time with patient.  This included previsit chart review, lab review, study review, order entry, electronic health record documentation, patient education.   Greig Forbes, MSN, FNP-C 06/08/2024, 4:33 PM  [facility] [address] [place], [place] [address] [phone]

## 2024-06-08 NOTE — Patient Instructions (Signed)
Below is our plan:  We will continue donepezil 10mg and memantine 10mg daily  Please make sure you are staying well hydrated. I recommend 50-60 ounces daily. Well balanced diet and regular exercise encouraged. Consistent sleep schedule with 6-8 hours recommended.   Please continue follow up with care team as directed.   Follow up with me in 1 year   You may receive a survey regarding today's visit. I encourage you to leave honest feed back as I do use this information to improve patient care. Thank you for seeing me today!   Management of Memory Problems   There are some general things you can do to help manage your memory problems.  Your memory may not in fact recover, but by using techniques and strategies you will be able to manage your memory difficulties better.   1)  Establish a routine. Try to establish and then stick to a regular routine.  By doing this, you will get used to what to expect and you will reduce the need to rely on your memory.  Also, try to do things at the same time of day, such as taking your medication or checking your calendar first thing in the morning. Think about think that you can do as a part of a regular routine and make a list.  Then enter them into a daily planner to remind you.  This will help you establish a routine.   2)  Organize your environment. Organize your environment so that it is uncluttered.  Decrease visual stimulation.  Place everyday items such as keys or cell phone in the same place every day (ie.  Basket next to front door) Use post it notes with a brief message to yourself (ie. Turn off light, lock the door) Use labels to indicate where things go (ie. Which cupboards are for food, dishes, etc.) Keep a notepad and pen by the telephone to take messages   3)  Memory Aids A diary or journal/notebook/daily planner Making a list (shopping list, chore list, to do list that needs to be done) Using an alarm as a reminder (kitchen timer or cell  phone alarm) Using cell phone to store information (Notes, Calendar, Reminders) Calendar/White board placed in a prominent position Post-it notes   In order for memory aids to be useful, you need to have good habits.  It's no good remembering to make a note in your journal if you don't remember to look in it.  Try setting aside a certain time of day to look in journal.   4)  Improving mood and managing fatigue. There may be other factors that contribute to memory difficulties.  Factors, such as anxiety, depression and tiredness can affect memory. Regular gentle exercise can help improve your mood and give you more energy. Exercise: there are short videos created by the National Institute on Health specially for older adults: https://bit.ly/2I30q97.  Mediterranean diet: which emphasizes fruits, vegetables, whole grains, legumes, fish, and other seafood; unsaturated fats such as olive oils; and low amounts of red meat, eggs, and sweets. A variation of this, called MIND (Mediterranean-DASH Intervention for Neurodegenerative Delay) incorporates the DASH (Dietary Approaches to Stop Hypertension) diet, which has been shown to lower high blood pressure, a risk factor for Alzheimer's disease. More information at: https://www.nia.nih.gov/health/what-do-we-know-about-diet-and-prevention-alzheimers-disease.  Aerobic exercise that improve heart health is also good for the mind.  National Institute on Aging have short videos for exercises that you can do at home: www.nia.nih.gov/Go4Life Simple relaxation techniques may help relieve   symptoms of anxiety Try to get back to completing activities or hobbies you enjoyed doing in the past. Learn to pace yourself through activities to decrease fatigue. Find out about some local support groups where you can share experiences with others. Try and achieve 7-8 hours of sleep at night.   Resources for Family/Caregiver  Online caregiver support groups can be found at  alz.org or call Alzheimer's Association's 24/7 hotline: 800.272.3900. [facility] offers in-person, virtual support groups and individual counseling for both care partners and persons with memory loss. Call for more information at [phone].   Advanced care plan: there are two types of Power of Attorney: healthcare and durable. Healthcare POA is a designated person to make healthcare decisions on your behalf if you were too sick to make them yourself. This person can be selected and documented by your physician. Durable POA has to be set up with a lawyer who takes charge of your finances and estate if you were too sick or cognitively impaired to manage your finances accurately. You can find a local Elder Law lawyer here: https://www.naela.org/.  Check out www.planyourlifespan.org, which will help you plan before a crisis and decide who will take care of life considerations in a circumstance where you may not be able to speak for yourself.   Helpful books (available on Amazon or your local bookstore):  By Dr. Ed Shaw: Keeping Love Alive as Memories Fade: The 5 Love Languages and the Alzheimer's Journey Jun 17, 2015 The Dementia Care Partner's Workbook: A Guide for Understanding, Education, and Hope Paperback - February 14, 2018.  Both available for less than $15.   "Coping with behavior change in dementia: a family caregiver's guide" by Beth Spencer & Laurie White "A Caregiver's Guide to Dementia: Using Activities and Other Strategies to Prevent, Reduce and Manage Behavioral Symptoms" by Laura N. Gitlin and Catherine Piersol.  "Creating Moments of Joy for the Person with Alzheimer's or Dementia" 4th edition by Jolene Brackrey  Caregiver videos on common behaviors related to dementia: https://www.uclahealth.org/dementia/caregiver-education-videos  [facility] Caregiver Portal: free to sign up, links to local resources: [email or web address]/login  

## 2024-06-09 ENCOUNTER — Ambulatory Visit: Admitting: Family Medicine

## 2024-06-09 ENCOUNTER — Encounter: Payer: Self-pay | Admitting: Family Medicine

## 2024-06-09 VITALS — BP 114/74 | HR 66 | Ht 64.0 in | Wt 160.5 lb

## 2024-06-09 DIAGNOSIS — F03C0 Unspecified dementia, severe, without behavioral disturbance, psychotic disturbance, mood disturbance, and anxiety: Secondary | ICD-10-CM | POA: Diagnosis not present

## 2024-06-09 MED ORDER — DONEPEZIL HCL 10 MG PO TABS: 10.0000 mg | ORAL_TABLET | Freq: Every day | ORAL | 3 refills | Status: AC

## 2024-06-09 MED ORDER — MEMANTINE HCL 10 MG PO TABS: 10.0000 mg | ORAL_TABLET | Freq: Two times a day (BID) | ORAL | 3 refills | Status: AC

## 2024-06-18 ENCOUNTER — Ambulatory Visit: Admitting: Family Medicine

## 2024-06-18 ENCOUNTER — Ambulatory Visit

## 2024-06-19 ENCOUNTER — Other Ambulatory Visit: Payer: Self-pay | Admitting: Family Medicine

## 2024-06-24 ENCOUNTER — Telehealth: Payer: Self-pay | Admitting: Family Medicine

## 2024-06-24 NOTE — Telephone Encounter (Signed)
 Copied from CRM [id number]. Topic: Clinical - Prescription Issue >> Jun 24, 2024 12:56 PM Alfonso ORN wrote: Reason for CRM: rx for capsules due to advance dementia. Want to authorize tablets for easier swallowing  [phone] Fax: [phone]

## 2024-06-24 NOTE — Telephone Encounter (Signed)
 Called pharmacy to clarify as pt was given capsules for brother to open and pour into food. Pharmacist reports brother is having a hard time opening the capsules therefore he would like to switch to tablets. Placed them on hold and got ok from Vickie to switch to tablets. Informed them of the ok and that provider states medication cannot be coated or XR. Pharmacist voiced understanding

## 2024-07-28 ENCOUNTER — Encounter: Payer: Self-pay | Admitting: Pharmacist

## 2024-07-28 NOTE — Progress Notes (Signed)
 Pharmacy Quality Measure Review  This patient is appearing on a report for being at risk of failing the adherence measure for cholesterol (statin) and hypertension (ACEi/ARB) medications this calendar year.   Medication: atorvastatin  Last fill date: 07/13/24 for 90 day supply  Medication: losartan  Last fill date: 06/19/24 for 90 day supply  Insurance report was not up to date. No action needed at this time.   Darrelyn Drum, PharmD, BCPS, CPP Clinical Pharmacist Practitioner [facility] [phone]

## 2024-08-03 ENCOUNTER — Other Ambulatory Visit: Payer: Self-pay

## 2024-08-03 ENCOUNTER — Ambulatory Visit: Payer: Self-pay | Admitting: Family Medicine

## 2024-08-03 ENCOUNTER — Emergency Department (HOSPITAL_COMMUNITY)

## 2024-08-03 ENCOUNTER — Telehealth: Payer: Self-pay

## 2024-08-03 ENCOUNTER — Inpatient Hospital Stay (HOSPITAL_COMMUNITY)
Admission: EM | Admit: 2024-08-03 | Discharge: 2024-08-05 | DRG: 683 | Disposition: A | Discharge status: Home / Self Care | Attending: Internal Medicine | Admitting: Internal Medicine

## 2024-08-03 ENCOUNTER — Ambulatory Visit: Admitting: Family Medicine

## 2024-08-03 ENCOUNTER — Ambulatory Visit (INDEPENDENT_AMBULATORY_CARE_PROVIDER_SITE_OTHER)

## 2024-08-03 ENCOUNTER — Encounter: Payer: Self-pay | Admitting: Family Medicine

## 2024-08-03 ENCOUNTER — Encounter (HOSPITAL_COMMUNITY): Payer: Self-pay

## 2024-08-03 VITALS — BP 118/68 | HR 65 | Ht 64.0 in | Wt 157.0 lb

## 2024-08-03 VITALS — BP 118/68 | HR 65 | Temp 97.6°F | Ht 64.0 in | Wt 157.0 lb

## 2024-08-03 DIAGNOSIS — Z780 Asymptomatic menopausal state: Secondary | ICD-10-CM

## 2024-08-03 DIAGNOSIS — Z23 Encounter for immunization: Secondary | ICD-10-CM

## 2024-08-03 DIAGNOSIS — R4701 Aphasia: Secondary | ICD-10-CM

## 2024-08-03 DIAGNOSIS — Z0000 Encounter for general adult medical examination without abnormal findings: Secondary | ICD-10-CM | POA: Diagnosis not present

## 2024-08-03 DIAGNOSIS — I10 Essential (primary) hypertension: Secondary | ICD-10-CM | POA: Diagnosis present

## 2024-08-03 DIAGNOSIS — Z1159 Encounter for screening for other viral diseases: Secondary | ICD-10-CM

## 2024-08-03 DIAGNOSIS — Z818 Family history of other mental and behavioral disorders: Secondary | ICD-10-CM

## 2024-08-03 DIAGNOSIS — R8290 Unspecified abnormal findings in urine: Secondary | ICD-10-CM

## 2024-08-03 DIAGNOSIS — N179 Acute kidney failure, unspecified: Secondary | ICD-10-CM | POA: Diagnosis present

## 2024-08-03 DIAGNOSIS — E785 Hyperlipidemia, unspecified: Secondary | ICD-10-CM | POA: Diagnosis present

## 2024-08-03 DIAGNOSIS — G4700 Insomnia, unspecified: Secondary | ICD-10-CM | POA: Diagnosis present

## 2024-08-03 DIAGNOSIS — E039 Hypothyroidism, unspecified: Secondary | ICD-10-CM | POA: Diagnosis present

## 2024-08-03 DIAGNOSIS — R6889 Other general symptoms and signs: Secondary | ICD-10-CM

## 2024-08-03 DIAGNOSIS — F03C0 Unspecified dementia, severe, without behavioral disturbance, psychotic disturbance, mood disturbance, and anxiety: Secondary | ICD-10-CM | POA: Diagnosis not present

## 2024-08-03 DIAGNOSIS — F03C3 Unspecified dementia, severe, with mood disturbance: Secondary | ICD-10-CM | POA: Diagnosis present

## 2024-08-03 DIAGNOSIS — D649 Anemia, unspecified: Secondary | ICD-10-CM

## 2024-08-03 DIAGNOSIS — Z79890 Hormone replacement therapy (postmenopausal): Secondary | ICD-10-CM

## 2024-08-03 DIAGNOSIS — R4189 Other symptoms and signs involving cognitive functions and awareness: Secondary | ICD-10-CM | POA: Diagnosis present

## 2024-08-03 DIAGNOSIS — Z888 Allergy status to other drugs, medicaments and biological substances status: Secondary | ICD-10-CM

## 2024-08-03 DIAGNOSIS — R638 Other symptoms and signs concerning food and fluid intake: Secondary | ICD-10-CM | POA: Insufficient documentation

## 2024-08-03 DIAGNOSIS — Z7189 Other specified counseling: Secondary | ICD-10-CM

## 2024-08-03 DIAGNOSIS — Z90710 Acquired absence of both cervix and uterus: Secondary | ICD-10-CM

## 2024-08-03 DIAGNOSIS — E876 Hypokalemia: Secondary | ICD-10-CM | POA: Diagnosis not present

## 2024-08-03 DIAGNOSIS — F32A Depression, unspecified: Secondary | ICD-10-CM | POA: Diagnosis present

## 2024-08-03 DIAGNOSIS — R7989 Other specified abnormal findings of blood chemistry: Secondary | ICD-10-CM | POA: Diagnosis not present

## 2024-08-03 DIAGNOSIS — Z515 Encounter for palliative care: Secondary | ICD-10-CM | POA: Diagnosis not present

## 2024-08-03 DIAGNOSIS — Z8249 Family history of ischemic heart disease and other diseases of the circulatory system: Secondary | ICD-10-CM | POA: Diagnosis not present

## 2024-08-03 DIAGNOSIS — N289 Disorder of kidney and ureter, unspecified: Secondary | ICD-10-CM | POA: Diagnosis not present

## 2024-08-03 DIAGNOSIS — Z1231 Encounter for screening mammogram for malignant neoplasm of breast: Secondary | ICD-10-CM

## 2024-08-03 DIAGNOSIS — Z79899 Other long term (current) drug therapy: Secondary | ICD-10-CM | POA: Diagnosis not present

## 2024-08-03 DIAGNOSIS — R7303 Prediabetes: Secondary | ICD-10-CM

## 2024-08-03 DIAGNOSIS — Z1211 Encounter for screening for malignant neoplasm of colon: Secondary | ICD-10-CM

## 2024-08-03 DIAGNOSIS — N281 Cyst of kidney, acquired: Secondary | ICD-10-CM | POA: Diagnosis not present

## 2024-08-03 LAB — LIPID PANEL
Cholesterol: 139 mg/dL (ref 0–200)
HDL: 41.3 mg/dL (ref >39.00)
LDL Cholesterol: 86 mg/dL (ref 0–99)
NonHDL: 97.41
Total CHOL/HDL Ratio: 3
Triglycerides: 55 mg/dL (ref 0.0–149.0)
VLDL: 11 mg/dL (ref 0.0–40.0)

## 2024-08-03 LAB — I-STAT CHEM 8, ED
BUN: 42 mg/dL — ABNORMAL HIGH (ref 8–23)
Calcium, Ion: 1.13 mmol/L — ABNORMAL LOW (ref 1.15–1.40)
Chloride: 105 mmol/L (ref 98–111)
Creatinine, Ser: 2.7 mg/dL — ABNORMAL HIGH (ref 0.44–1.00)
Glucose, Bld: 96 mg/dL (ref 70–99)
HCT: 31 % — ABNORMAL LOW (ref 36.0–46.0)
Hemoglobin: 10.5 g/dL — ABNORMAL LOW (ref 12.0–15.0)
Potassium: 4.2 mmol/L (ref 3.5–5.1)
Sodium: 140 mmol/L (ref 135–145)
TCO2: 21 mmol/L — ABNORMAL LOW (ref 22–32)

## 2024-08-03 LAB — COMPREHENSIVE METABOLIC PANEL WITH GFR
ALT: 26 U/L (ref 0–35)
AST: 18 U/L (ref 0–37)
Albumin: 4.2 g/dL (ref 3.5–5.2)
Alkaline Phosphatase: 74 U/L (ref 39–117)
BUN: 45 mg/dL — ABNORMAL HIGH (ref 6–23)
CO2: 25 meq/L (ref 19–32)
Calcium: 9.5 mg/dL (ref 8.4–10.5)
Chloride: 105 meq/L (ref 96–112)
Creatinine, Ser: 2.7 mg/dL — ABNORMAL HIGH (ref 0.40–1.20)
GFR: 17.93 mL/min — ABNORMAL LOW (ref >60.00)
Glucose, Bld: 105 mg/dL — ABNORMAL HIGH (ref 70–99)
Potassium: 3.8 meq/L (ref 3.5–5.1)
Sodium: 140 meq/L (ref 135–145)
Total Bilirubin: 0.4 mg/dL (ref 0.2–1.2)
Total Protein: 7.4 g/dL (ref 6.0–8.3)

## 2024-08-03 LAB — CBC WITH DIFFERENTIAL/PLATELET
Abs Immature Granulocytes: 0.02 K/uL (ref 0.00–0.07)
Basophils Absolute: 0 K/uL (ref 0.0–0.1)
Basophils Absolute: 0 K/uL (ref 0.0–0.1)
Basophils Relative: 0.9 % (ref 0.0–3.0)
Basophils Relative: 0 %
Eosinophils Absolute: 0 K/uL (ref 0.0–0.7)
Eosinophils Absolute: 0 K/uL (ref 0.0–0.5)
Eosinophils Relative: 0.8 % (ref 0.0–5.0)
Eosinophils Relative: 0 %
HCT: 30.5 % — ABNORMAL LOW (ref 36.0–46.0)
HCT: 30.8 % — ABNORMAL LOW (ref 36.0–46.0)
Hemoglobin: 10 g/dL — ABNORMAL LOW (ref 12.0–15.0)
Hemoglobin: 9.9 g/dL — ABNORMAL LOW (ref 12.0–15.0)
Immature Granulocytes: 0 %
Lymphocytes Relative: 41.6 % (ref 12.0–46.0)
Lymphocytes Relative: 14 %
Lymphs Abs: 2.1 K/uL (ref 0.7–4.0)
Lymphs Abs: 0.9 K/uL (ref 0.7–4.0)
MCH: 28.1 pg (ref 26.0–34.0)
MCHC: 32.7 g/dL (ref 30.0–36.0)
MCHC: 32.1 g/dL (ref 30.0–36.0)
MCV: 84.4 fl (ref 78.0–100.0)
MCV: 87.5 fL (ref 80.0–100.0)
Monocytes Absolute: 0.3 K/uL (ref 0.1–1.0)
Monocytes Absolute: 0.5 K/uL (ref 0.1–1.0)
Monocytes Relative: 6.6 % (ref 3.0–12.0)
Monocytes Relative: 7 %
Neutro Abs: 2.5 K/uL (ref 1.4–7.7)
Neutro Abs: 5.1 K/uL (ref 1.7–7.7)
Neutrophils Relative %: 50.1 % (ref 43.0–77.0)
Neutrophils Relative %: 79 %
Platelets: 318 K/uL (ref 150.0–400.0)
Platelets: 312 K/uL (ref 150–400)
RBC: 3.61 Mil/uL — ABNORMAL LOW (ref 3.87–5.11)
RBC: 3.52 MIL/uL — ABNORMAL LOW (ref 3.87–5.11)
RDW: 14.5 % (ref 11.5–15.5)
RDW: 14.1 % (ref 11.5–15.5)
WBC: 4.9 K/uL (ref 4.0–10.5)
WBC: 6.6 K/uL (ref 4.0–10.5)
nRBC: 0 % (ref 0.0–0.2)

## 2024-08-03 LAB — BASIC METABOLIC PANEL WITH GFR
Anion gap: 12 (ref 5–15)
BUN: 44 mg/dL — ABNORMAL HIGH (ref 8–23)
CO2: 22 mmol/L (ref 22–32)
Calcium: 9.5 mg/dL (ref 8.9–10.3)
Chloride: 104 mmol/L (ref 98–111)
Creatinine, Ser: 2.37 mg/dL — ABNORMAL HIGH (ref 0.44–1.00)
GFR, Estimated: 22 mL/min — ABNORMAL LOW (ref >60)
Glucose, Bld: 100 mg/dL — ABNORMAL HIGH (ref 70–99)
Potassium: 4.3 mmol/L (ref 3.5–5.1)
Sodium: 138 mmol/L (ref 135–145)

## 2024-08-03 LAB — URINALYSIS, W/ REFLEX TO CULTURE (INFECTION SUSPECTED)
Bilirubin Urine: NEGATIVE
Glucose, UA: NEGATIVE mg/dL
Hgb urine dipstick: NEGATIVE
Ketones, ur: NEGATIVE mg/dL
Leukocytes,Ua: NEGATIVE
Nitrite: NEGATIVE
Protein, ur: NEGATIVE mg/dL
Specific Gravity, Urine: 1.004 — ABNORMAL LOW (ref 1.005–1.030)
pH: 6 (ref 5.0–8.0)

## 2024-08-03 LAB — VITAMIN B12: Vitamin B-12: >1500 pg/mL — ABNORMAL HIGH (ref 211–911)

## 2024-08-03 LAB — TSH: TSH: 0.88 u[IU]/mL (ref 0.35–5.50)

## 2024-08-03 LAB — FOLATE: Folate: 6.4 ng/mL (ref >5.9)

## 2024-08-03 LAB — FERRITIN: Ferritin: 314.4 ng/mL — ABNORMAL HIGH (ref 10.0–291.0)

## 2024-08-03 MED ORDER — ONDANSETRON HCL 4 MG/2ML IJ SOLN: 4.0000 mg | Freq: Four times a day (QID) | INTRAMUSCULAR | Status: DC | PRN

## 2024-08-03 MED ORDER — DONEPEZIL HCL 10 MG PO TABS
10.0000 mg | ORAL_TABLET | Freq: Every day | ORAL | Status: DC
  Administered 2024-08-04 (×2): 10 mg via ORAL
  Filled 2024-08-03 (×2): qty 1

## 2024-08-03 MED ORDER — LEVOTHYROXINE SODIUM 75 MCG PO TABS
75.0000 ug | ORAL_TABLET | Freq: Every day | ORAL | Status: DC
  Administered 2024-08-04 – 2024-08-05 (×2): 75 ug via ORAL
  Filled 2024-08-03 (×2): qty 1

## 2024-08-03 MED ORDER — ONDANSETRON HCL 4 MG PO TABS: 4.0000 mg | ORAL_TABLET | Freq: Four times a day (QID) | ORAL | Status: DC | PRN

## 2024-08-03 MED ORDER — CEPHALEXIN 500 MG PO CAPS: 500.0000 mg | ORAL_CAPSULE | Freq: Two times a day (BID) | ORAL | 0 refills | Status: DC

## 2024-08-03 MED ORDER — ACETAMINOPHEN 325 MG PO TABS: 650.0000 mg | ORAL_TABLET | Freq: Four times a day (QID) | ORAL | Status: DC | PRN

## 2024-08-03 MED ORDER — MEMANTINE HCL 10 MG PO TABS
10.0000 mg | ORAL_TABLET | Freq: Two times a day (BID) | ORAL | Status: DC
  Administered 2024-08-04 – 2024-08-05 (×4): 10 mg via ORAL
  Filled 2024-08-03 (×4): qty 1

## 2024-08-03 MED ORDER — LACTATED RINGERS IV BOLUS
500.0000 mL | Freq: Once | INTRAVENOUS | Status: AC
  Administered 2024-08-03: 500 mL via INTRAVENOUS

## 2024-08-03 MED ORDER — ACETAMINOPHEN 650 MG RE SUPP: 650.0000 mg | Freq: Four times a day (QID) | RECTAL | Status: DC | PRN

## 2024-08-03 MED ORDER — LACTATED RINGERS IV SOLN: INTRAVENOUS | Status: DC

## 2024-08-03 MED ORDER — MIRTAZAPINE 15 MG PO TBDP
15.0000 mg | ORAL_TABLET | Freq: Every day | ORAL | Status: DC
  Administered 2024-08-04 (×2): 15 mg via ORAL
  Filled 2024-08-03 (×3): qty 1

## 2024-08-03 MED ORDER — ATORVASTATIN CALCIUM 10 MG PO TABS
20.0000 mg | ORAL_TABLET | Freq: Every day | ORAL | Status: DC
  Administered 2024-08-04 – 2024-08-05 (×2): 20 mg via ORAL
  Filled 2024-08-03 (×2): qty 2

## 2024-08-03 MED ORDER — ENOXAPARIN SODIUM 30 MG/0.3ML IJ SOSY
30.0000 mg | PREFILLED_SYRINGE | INTRAMUSCULAR | Status: DC
  Administered 2024-08-04 – 2024-08-05 (×2): 30 mg via SUBCUTANEOUS
  Filled 2024-08-03 (×2): qty 0.3

## 2024-08-03 NOTE — Telephone Encounter (Signed)
 Patient dropped off document DMV, to be filled out by provider. Patient requested to send it back via Call Patient to pick up within ASAP. Document is located in providers tray at front office.Please advise at Mobile [phone].

## 2024-08-03 NOTE — ED Provider Notes (Signed)
 [place] [facility] AT [facility] Provider Note   CSN: [id number] Arrival date & time: 08/03/24  1751     Patient presents with: Abnormal Labs (Increased Kidney Function)   Debbie Brandt is a 63 y.o. female.   63 year old female presents for evaluation of abnormal lab work.  She has a history of dementia and husband provides history.  States he got a phone call from the office today after patient had labs drawn and was told to come to the ER for elevated creatinine levels.  He states patient has been eating and drinking like normal he has not seemed to have any signs of infection or pain.  He states she has been urinating a normal amount as far as he knows.  Further history limited at this time.        Prior to Admission medications   Medication Sig Start Date End Date Taking? Authorizing Provider  atorvastatin  (LIPITOR) 20 MG tablet TAKE 1 TABLET BY MOUTH DAILY. 04/13/24   Henson, Vickie L, NP-C  cephALEXin (KEFLEX) 500 MG capsule Take 1 capsule (500 mg total) by mouth 2 (two) times daily. 08/03/24   Henson, Vickie L, NP-C  cyanocobalamin (VITAMIN B12) 1000 MCG tablet Take 1,000 mcg by mouth daily.    [provider]  donepezil  (ARICEPT ) 10 MG tablet Take 1 tablet (10 mg total) by mouth at bedtime. 06/09/24   Lomax, Amy, NP  levothyroxine  (SYNTHROID ) 75 MCG tablet TAKE 1 TABLET BY MOUTH EVERY MORNING BEFORE BREAKFAST 03/23/24   Henson, Vickie L, NP-C  losartan  (COZAAR ) 50 MG tablet Take 1 tablet (50 mg total) by mouth daily. 03/23/24   Henson, Vickie L, NP-C  memantine  (NAMENDA ) 10 MG tablet Take 1 tablet (10 mg total) by mouth 2 (two) times daily. 06/09/24   Lomax, Amy, NP  mirtazapine  (REMERON  SOL-TAB) 15 MG disintegrating tablet DISSOLVE 1 TABLET ON TONGUE EVERY NIGHT AT BEDTIME 03/23/24   Henson, Vickie L, NP-C  triamterene -hydrochlorothiazide  (DYAZIDE ) 37.5-25 MG capsule TAKE 1 CAPSULE BY MOUTH DAILY 06/21/24   Henson, Vickie L, NP-C    Allergies:  Voltaren  [diclofenac  sodium]    Review of Systems  Reason unable to perform ROS: dementia.    Updated Vital Signs BP (!) 158/58 (BP Location: Left Arm)   Pulse 72   Temp 98.4 F (36.9 C) (Oral)   Resp 20   Ht 5' 4 (1.626 m)   Wt 71.2 kg   SpO2 100%   BMI 26.94 kg/m   Physical Exam Vitals and nursing note reviewed.  Constitutional:      General: She is not in acute distress.    Appearance: She is well-developed.  HENT:     Head: Normocephalic and atraumatic.  Eyes:     Conjunctiva/sclera: Conjunctivae normal.  Cardiovascular:     Rate and Rhythm: Normal rate and regular rhythm.     Heart sounds: No murmur heard. Pulmonary:     Effort: Pulmonary effort is normal. No respiratory distress.     Breath sounds: Normal breath sounds.  Abdominal:     Palpations: Abdomen is soft.     Tenderness: There is no abdominal tenderness.  Musculoskeletal:        General: No swelling.     Cervical back: Neck supple.  Skin:    General: Skin is warm and dry.     Capillary Refill: Capillary refill takes less than 2 seconds.  Neurological:     Mental Status: She is alert.  Psychiatric:  Mood and Affect: Mood normal.     (all labs ordered are listed, but only abnormal results are displayed) Labs Reviewed  URINALYSIS, W/ REFLEX TO CULTURE (INFECTION SUSPECTED) - Abnormal; Notable for the following components:      Result Value   Color, Urine COLORLESS (*)    Specific Gravity, Urine 1.004 (*)    Bacteria, UA FEW (*)    All other components within normal limits  CBC WITH DIFFERENTIAL/PLATELET - Abnormal; Notable for the following components:   RBC 3.52 (*)    Hemoglobin 9.9 (*)    HCT 30.8 (*)    All other components within normal limits  BASIC METABOLIC PANEL WITH GFR - Abnormal; Notable for the following components:   Glucose, Bld 100 (*)    BUN 44 (*)    Creatinine, Ser 2.37 (*)    GFR, Estimated 22 (*)    All other components within normal limits  I-STAT CHEM 8, ED  - Abnormal; Notable for the following components:   BUN 42 (*)    Creatinine, Ser 2.70 (*)    Calcium , Ion 1.13 (*)    TCO2 21 (*)    Hemoglobin 10.5 (*)    HCT 31.0 (*)    All other components within normal limits    EKG: EKG Interpretation Date/Time:  Tuesday August 03 2024 18:33:54 EST Ventricular Rate:  73 PR Interval:  172 QRS Duration:  90 QT Interval:  391 QTC Calculation: 431 R Axis:   38  Text Interpretation: Sinus rhythm Abnormal R-wave progression, early transition Compared with prior EKG from 03/09/2016 Confirmed by Gennaro Bouchard ([id number]) on 08/03/2024 6:44:00 PM  Radiology: CT ABDOMEN PELVIS WO CONTRAST Result Date: 08/03/2024 EXAM: CT ABDOMEN AND PELVIS WITHOUT CONTRAST 08/03/2024 07:48:11 PM TECHNIQUE: CT of the abdomen and pelvis was performed without the administration of intravenous contrast. Multiplanar reformatted images are provided for review. Automated exposure control, iterative reconstruction, and/or weight-based adjustment of the mA/kV was utilized to reduce the radiation dose to as low as reasonably achievable. COMPARISON: None available. CLINICAL HISTORY: elevated creatinine, decreased urine FINDINGS: LOWER CHEST: No acute abnormality. LIVER: The liver is unremarkable. GALLBLADDER AND BILE DUCTS: Gallbladder is unremarkable. No biliary ductal dilatation. SPLEEN: No acute abnormality. PANCREAS: No acute abnormality. ADRENAL GLANDS: No acute abnormality. KIDNEYS, URETERS AND BLADDER: Fluid lesion within the left kidney likely represents a simple renal cyst. Simple renal cysts do not require additional follow-up unless clinically indicated due to signs/symptoms. No stones in the kidneys or ureters. No hydronephrosis. No perinephric or periureteral stranding. Urinary bladder is unremarkable. GI AND BOWEL: Stomach demonstrates no acute abnormality. There is no bowel obstruction. No small or large bowel thickening or dilatation. Appendix is unremarkable. PERITONEUM  AND RETROPERITONEUM: No ascites. No free air. VASCULATURE: Aorta is normal in caliber. LYMPH NODES: No lymphadenopathy. REPRODUCTIVE ORGANS: Status post hysterectomy. No adnexal mass. BONES AND SOFT TISSUES: No acute osseous abnormality. Intervertebral disc space vacuum phenomenon at the L5-S1 level. Disc bulge at the L3-L4, L4-L5 and L5-S1 levels. IMPRESSION: 1. No acute findings in the abdomen or pelvis is limited evaluation on this noncontrast study. . 2. Other, non-acute and/or normal findings as above. Electronically signed by: Morgane Naveau MD 08/03/2024 08:30 PM EST RP Workstation: [id number]     Procedures   Medications Ordered in the ED  lactated ringers  bolus 500 mL (500 mLs Intravenous New Bag/Given 08/03/24 1920)  Medical Decision Making Cardiac monitor interpretation: Sinus rhythm, no ectopy  Social determinants of health: Patient has dementia, nonverbal  Patient sent in for AKI.  Per husband she has been eating and drinking okay.  Urine negative, labs otherwise fairly unremarkable and CT scan unremarkable.  Given IV fluids here in.  Stable.  Discussed patient case with hospitalist and patient will be admitted for further workup and management.  Family at bedside is agreeable with the plan.  Problems Addressed: AKI (acute kidney injury): undiagnosed new problem with uncertain prognosis  Amount and/or Complexity of Data Reviewed Independent Historian: spouse    Details: Also at bedside helps provide history.  He states patient has been eating and drinking and has not noticed any changes in her lately External Data Reviewed: notes.    Details: Outpatient.  Patient was sent to the ER for AKI Labs: ordered. Decision-making details documented in ED Course.    Details: Ordered and reviewed by me and patient has AKI Radiology: ordered and independent interpretation performed. Decision-making details documented in ED Course.    Details: Ordered  and reviewed by me CT abdomen: Shows no acute abnormality ECG/medicine tests: ordered and independent interpretation performed. Decision-making details documented in ED Course.    Details: Ordered and interpreted by me in the absence of cardiology and shows sinus rhythm, no STEMI or significant change when compared to prior Discussion of management or test interpretation with external provider(s): Dr. Dena -hospitalist-I spoke with him on the phone regarding the patient's case and he will admit the patient for further workup and management  Risk OTC drugs. Prescription drug management. Drug therapy requiring intensive monitoring for toxicity. Decision regarding hospitalization. Diagnosis or treatment significantly limited by social determinants of health.     Final diagnoses:  AKI (acute kidney injury)    ED Discharge Orders     None          Gennaro Duwaine CROME, DO 08/03/24 2336

## 2024-08-03 NOTE — Telephone Encounter (Signed)
 Forms received, placed on pcp desk for signature for when she returns

## 2024-08-03 NOTE — ED Triage Notes (Signed)
 Pt went to PCP today for a check up, had blood work and has elevated BUN and creatinine, this is not normal for pt. Denies any sx. Pt has dementia and is normally non-verbal, sometimes talks.

## 2024-08-03 NOTE — Progress Notes (Signed)
 Subjective:     Patient ID: Debbie Brandt, female    DOB: 11/30/1960, 63 y.o.   MRN: [id number]  Chief Complaint  Patient presents with   Medical Management of Chronic Issues    3 month f/u. Would like discuss personal hygiene as brother is caretaker and has to clean up after her when she goes to the bathroom. Describes the smell as strong urine odor and wants to know if its due to her not wiping and seeing if anything else he can do  Pt is cold all the time, having to blast heat    HPI  Discussed the use of AI scribe software for clinical note transcription with the patient, who gave verbal consent to proceed.  History of Present Illness Debbie Brandt is a 63 year old female with dementia who presents with concerns regarding self-care and possible urinary tract infection. She is accompanied by her brother, who is her primary caregiver.  Cognitive impairment and communication deficits - Dementia with significant impairment in self-care abilities - Non-verbal most of the time, with occasional speech - Requires total care from her brother, who is her primary caregiver - Followed by neurology   Functional decline and activities of daily living - Requires assistance with feeding; unable to use utensils independently - Medications are crushed and administered with applesauce - Able to eat some finger foods, such as chicken nuggets, independently - Brother manages all aspects of her care  Urinary incontinence and hygiene concerns - Urinates independently but does not flush the toilet or wipe herself - Strong urinary odors present, raising concern for possible urinary tract infection - Occasionally soils the chair she sits in; chair is protected with a cover and cleaned by her brother  Medication management - Currently taking levothyroxine , Namenda , Aricept , antihypertensive, and cholesterol-lowering medications - No issues with medication administration  reported  Constitutional symptoms and laboratory abnormalities - Described as being cold all the time - History of mild anemia - History of low potassium levels     Health Maintenance Due  Topic Date Due   Hepatitis C Screening  Never done   Zoster Vaccines- Shingrix (1 of 2) 12/03/2008   Mammogram  05/08/2014   DEXA SCAN  Never done   COVID-19 Vaccine (3 - 2025-26 season) 05/17/2024   Cervical Cancer Screening (HPV/Pap Cotest)  07/29/2024    Past Medical History:  Diagnosis Date   Chest pain 09/26/2015   Depression    Essential hypertension 09/26/2015   GERD (gastroesophageal reflux disease)    Hypertension    Hypothyroidism 09/26/2015   Murmur 09/26/2015   Thyroid disease     Past Surgical History:  Procedure Laterality Date   ABDOMINAL HYSTERECTOMY     CESAREAN SECTION     LAPAROSCOPIC ABDOMINAL EXPLORATION N/A 11/21/2017   Procedure: LAPAROSCOPIC ABDOMINAL EXPLORATION LYSIS OF ADHESIONS;  Surgeon: Aron Shoulders, MD;  Location: [facility];  Service: General;  Laterality: N/A;   LAPAROTOMY N/A 10/28/2016   Procedure: EXPLORATORY LAPAROTOMY WITH  LYSIS OF ADHESIONS;  Surgeon: Krystal Russell, MD;  Location: [facility];  Service: General;  Laterality: N/A;    Family History  Problem Relation Age of Onset   Cancer Mother        lung cancer   Hypertension Mother    Cancer Father    Heart attack Father    Mental illness Sister    Hypertension Sister    Hyperlipidemia Sister    Dementia Sister    Diabetes Brother  Hypertension Brother     Social History   Socioeconomic History   Marital status: Widowed    Spouse name: Not on file   Number of children: Not on file   Years of education: Not on file   Highest education level: Not on file  Occupational History   Not on file  Tobacco Use   Smoking status: Never   Smokeless tobacco: Never  Vaping Use   Vaping status: Never Used  Substance and Sexual Activity   Alcohol use: No    Alcohol/week: 0.0 standard drinks of  alcohol   Drug use: No   Sexual activity: Not Currently    Comment: 1st intercourse- 15, partners3, widow  Other Topics Concern   Not on file  Social History Narrative   Epworth Sleepiness Scale = 6 (as of 09/26/2015)   Social Drivers of Health   Financial Resource Strain: Not on file  Food Insecurity: No Food Insecurity (08/03/2024)   Hunger Vital Sign    Worried About Running Out of Food in the Last Year: Never true    Ran Out of Food in the Last Year: Never true  Transportation Needs: No Transportation Needs (08/03/2024)   PRAPARE - Administrator, Civil Service (Medical): No    Lack of Transportation (Non-Medical): No  Physical Activity: Inactive (08/03/2024)   Exercise Vital Sign    Days of Exercise per Week: 0 days    Minutes of Exercise per Session: 0 min  Stress: No Stress Concern Present (08/03/2024)   [employer] of Occupational Health - Occupational Stress Questionnaire    Feeling of Stress: Not at all  Social Connections: Socially Isolated (08/03/2024)   Social Connection and Isolation Panel    Frequency of Communication with Friends and Family: Once a week    Frequency of Social Gatherings with Friends and Family: Never    Attends Religious Services: Never    Database Administrator or Organizations: No    Attends Banker Meetings: Never    Marital Status: Widowed  Intimate Partner Violence: Not At Risk (08/03/2024)   Humiliation, Afraid, Rape, and Kick questionnaire    Fear of Current or Ex-Partner: No    Emotionally Abused: No    Physically Abused: No    Sexually Abused: No    Outpatient Medications Prior to Visit  Medication Sig Dispense Refill   atorvastatin  (LIPITOR) 20 MG tablet TAKE 1 TABLET BY MOUTH DAILY. 90 tablet 1   cyanocobalamin (VITAMIN B12) 1000 MCG tablet Take 1,000 mcg by mouth daily.     donepezil  (ARICEPT ) 10 MG tablet Take 1 tablet (10 mg total) by mouth at bedtime. 90 tablet 3   levothyroxine  (SYNTHROID ) 75  MCG tablet TAKE 1 TABLET BY MOUTH EVERY MORNING BEFORE BREAKFAST 90 tablet 1   losartan  (COZAAR ) 50 MG tablet Take 1 tablet (50 mg total) by mouth daily. 90 tablet 1   memantine  (NAMENDA ) 10 MG tablet Take 1 tablet (10 mg total) by mouth 2 (two) times daily. 180 tablet 3   mirtazapine  (REMERON  SOL-TAB) 15 MG disintegrating tablet DISSOLVE 1 TABLET ON TONGUE EVERY NIGHT AT BEDTIME 90 tablet 1   triamterene -hydrochlorothiazide  (DYAZIDE ) 37.5-25 MG capsule TAKE 1 CAPSULE BY MOUTH DAILY 90 capsule 0   No facility-administered medications prior to visit.    Allergies  Allergen Reactions   Voltaren  [Diclofenac  Sodium] Other (See Comments)    Unknown, pt went to pick up at pharmacy and they said she was allergic  ROS Unable to answer     Objective:    Physical Exam Constitutional:      General: She is not in acute distress.    Appearance: She is not ill-appearing.  HENT:     Mouth/Throat:     Mouth: Mucous membranes are moist.     Pharynx: Oropharynx is clear.  Eyes:     Extraocular Movements: Extraocular movements intact.     Conjunctiva/sclera: Conjunctivae normal.     Pupils: Pupils are equal, round, and reactive to light.  Cardiovascular:     Rate and Rhythm: Normal rate and regular rhythm.  Pulmonary:     Effort: Pulmonary effort is normal.     Breath sounds: Normal breath sounds.  Musculoskeletal:     Cervical back: Normal range of motion and neck supple. No tenderness.     Right lower leg: No edema.     Left lower leg: No edema.  Lymphadenopathy:     Cervical: No cervical adenopathy.  Skin:    General: Skin is warm and dry.  Neurological:     General: No focal deficit present.     Mental Status: She is alert.     Motor: No weakness.     Coordination: Coordination normal.     Gait: Gait normal.     Comments: Mostly nonverbal.    Psychiatric:        Behavior: Behavior normal.      BP 118/68   Pulse 65   Temp 97.6 F (36.4 C) (Temporal)   Ht 5' 4 (1.626  m)   Wt 157 lb (71.2 kg)   SpO2 100%   BMI 26.95 kg/m  Wt Readings from Last 3 Encounters:  08/03/24 156 lb 15.5 oz (71.2 kg)  08/03/24 157 lb (71.2 kg)  08/03/24 157 lb (71.2 kg)       Assessment & Plan:   Problem List Items Addressed This Visit     Hypothyroidism (Chronic)   Relevant Orders   TSH (Completed)   Hypokalemia   Relevant Orders   Comprehensive metabolic panel with GFR (Completed)   Other Visit Diagnoses       Malodorous urine    -  Primary     Severe dementia without behavioral disturbance, psychotic disturbance, mood disturbance, or anxiety, unspecified dementia type (HCC)         Prediabetes       Relevant Orders   CBC with Differential/Platelet (Completed)   Comprehensive metabolic panel with GFR (Completed)     Nonverbal       Relevant Orders   Comprehensive metabolic panel with GFR (Completed)     Mild anemia       Relevant Orders   Ferritin (Completed)   Folate (Completed)   CBC with Differential/Platelet (Completed)   Vitamin B12 (Completed)     Sensation of feeling cold       Relevant Orders   Ferritin (Completed)   CBC with Differential/Platelet (Completed)   TSH (Completed)     Hyperlipidemia, unspecified hyperlipidemia type       Relevant Orders   Lipid panel (Completed)     Encounter for hepatitis C screening test for low risk patient           Assessment and Plan Assessment & Plan Severe dementia without behavioral or mood disturbance Severe dementia with significant functional impairment requiring total care. No behavioral or mood disturbances. Brother provides all care, including feeding and toileting. No interest in additional home care resources. - Continue  current care regimen provided by brother  Urinary symptoms, possible urinary tract infection Strong urinary odor possibly due to urinary tract infection. Unable to obtain a urine sample. No other urinary symptoms reported. - Prescribed antibiotic for possible urinary tract  infection  Hypothyroidism Managed with levothyroxine . Reports feeling cold, possibly related to thyroid function. - Ordered thyroid function tests  Anemia Mild anemia with potential worsening contributing to feeling cold. - Ordered complete blood count to assess anemia status  Hypokalemia Low potassium levels. - Ordered potassium level test  Addendum: Patient with AKI with GFR 17.93 (down from 61.42 in July). Called bother Lyn[place] and he will take patient to the ED for evaluation.    I am having Montie FREDRIK Schlichter start on cephALEXin. I am also having her maintain her cyanocobalamin, levothyroxine , mirtazapine , losartan , atorvastatin , memantine , donepezil , and triamterene -hydrochlorothiazide .  Meds ordered this encounter  Medications   cephALEXin (KEFLEX) 500 MG capsule    Sig: Take 1 capsule (500 mg total) by mouth 2 (two) times daily.    Dispense:  10 capsule    Refill:  0    Supervising Provider:   ROLLENE NORRIS A [[id number]]

## 2024-08-03 NOTE — H&P (Signed)
 History and Physical    Debbie Brandt FMW:[id number] DOB: 1960-10-23 DOA: 08/03/2024  PCP: Lendia Boby LITTIE, NP-C   Chief Complaint:     HPI: Debbie Brandt is a 63 y.o. female with medical history significant of cognitive impairment, hypertension, hype lipidemia who presented to the emergency department due to abnormal labs.  Patient lives with family and solely dependent on them for activities of daily living.  Due to patient's underlying cognitive impairment she is unable to eat or drink without being prompted.  Patient's family is not noted any abnormalities.  At her annual physical labs were obtained which were notable for elevated creatinine so she was referred to the ER for further evaluation.  Baseline creatinine is 1 on presentation 2.7, WBC 4.9, hemoglobin 10.  Patient was given IV fluids admitted for further workup. Admission patient's had poor p.o. intake.  Patient is unable to provide any history due to underlying cognitive impairment.  No infectious symptoms and no acute complaints.   Review of Systems: Review of Systems  Constitutional: Negative.  Negative for chills and fever.  HENT: Negative.    Eyes: Negative.   Respiratory: Negative.    Cardiovascular: Negative.   Gastrointestinal: Negative.   Genitourinary: Negative.   Musculoskeletal: Negative.   Skin: Negative.   Neurological: Negative.   Endo/Heme/Allergies: Negative.   Psychiatric/Behavioral: Negative.       As per HPI otherwise 10 point review of systems negative.   Allergies  Allergen Reactions   Voltaren  [Diclofenac  Sodium] Other (See Comments)    Unknown, pt went to pick up at pharmacy and they said she was allergic    Past Medical History:  Diagnosis Date   Chest pain 09/26/2015   Depression    Essential hypertension 09/26/2015   GERD (gastroesophageal reflux disease)    Hypertension    Hypothyroidism 09/26/2015   Murmur 09/26/2015   Thyroid disease     Past Surgical History:  Procedure  Laterality Date   ABDOMINAL HYSTERECTOMY     CESAREAN SECTION     LAPAROSCOPIC ABDOMINAL EXPLORATION N/A 11/21/2017   Procedure: LAPAROSCOPIC ABDOMINAL EXPLORATION LYSIS OF ADHESIONS;  Surgeon: Aron Shoulders, MD;  Location: [facility];  Service: General;  Laterality: N/A;   LAPAROTOMY N/A 10/28/2016   Procedure: EXPLORATORY LAPAROTOMY WITH  LYSIS OF ADHESIONS;  Surgeon: Krystal Russell, MD;  Location: [facility];  Service: General;  Laterality: N/A;     reports that she has never smoked. She has never used smokeless tobacco. She reports that she does not drink alcohol and does not use drugs.  Family History  Problem Relation Age of Onset   Cancer Mother        lung cancer   Hypertension Mother    Cancer Father    Heart attack Father    Mental illness Sister    Hypertension Sister    Hyperlipidemia Sister    Dementia Sister    Diabetes Brother    Hypertension Brother     Prior to Admission medications   Medication Sig Start Date End Date Taking? Authorizing Provider  atorvastatin  (LIPITOR) 20 MG tablet TAKE 1 TABLET BY MOUTH DAILY. 04/13/24   Henson, Vickie L, NP-C  cephALEXin (KEFLEX) 500 MG capsule Take 1 capsule (500 mg total) by mouth 2 (two) times daily. 08/03/24   Henson, Vickie L, NP-C  cyanocobalamin (VITAMIN B12) 1000 MCG tablet Take 1,000 mcg by mouth daily.    [provider]  donepezil  (ARICEPT ) 10 MG tablet Take 1 tablet (10 mg  total) by mouth at bedtime. 06/09/24   Lomax, Amy, NP  levothyroxine  (SYNTHROID ) 75 MCG tablet TAKE 1 TABLET BY MOUTH EVERY MORNING BEFORE BREAKFAST 03/23/24   Henson, Vickie L, NP-C  losartan  (COZAAR ) 50 MG tablet Take 1 tablet (50 mg total) by mouth daily. 03/23/24   Henson, Vickie L, NP-C  memantine  (NAMENDA ) 10 MG tablet Take 1 tablet (10 mg total) by mouth 2 (two) times daily. 06/09/24   Lomax, Amy, NP  mirtazapine  (REMERON  SOL-TAB) 15 MG disintegrating tablet DISSOLVE 1 TABLET ON TONGUE EVERY NIGHT AT BEDTIME 03/23/24   Henson, Vickie L, NP-C   triamterene -hydrochlorothiazide  (DYAZIDE ) 37.5-25 MG capsule TAKE 1 CAPSULE BY MOUTH DAILY 06/21/24   Lendia Boby CROME, NP-C    Physical Exam: Vitals:   08/03/24 1758 08/03/24 1801 08/03/24 1930 08/03/24 2328  BP: (!) 121/93  (!) 119/50 (!) 158/58  Pulse: 67  62 72  Resp: 15  12 20   Temp: 98.2 F (36.8 C)   98.4 F (36.9 C)  TempSrc: Oral   Oral  SpO2: 100%  100% 100%  Weight:  71.2 kg    Height:  5' 4 (1.626 m)     Physical Exam Constitutional:      Appearance: She is normal weight.  HENT:     Head: Normocephalic.     Nose: Nose normal.     Mouth/Throat:     Mouth: Mucous membranes are moist.     Pharynx: Oropharynx is clear.  Eyes:     Extraocular Movements: Extraocular movements intact.     Conjunctiva/sclera: Conjunctivae normal.     Pupils: Pupils are equal, round, and reactive to light.  Cardiovascular:     Rate and Rhythm: Normal rate and regular rhythm.     Pulses: Normal pulses.     Heart sounds: Normal heart sounds.  Pulmonary:     Effort: Pulmonary effort is normal.     Breath sounds: Normal breath sounds.  Abdominal:     General: Abdomen is flat. Bowel sounds are normal.  Musculoskeletal:        General: Normal range of motion.  Skin:    General: Skin is warm.     Capillary Refill: Capillary refill takes less than 2 seconds.  Neurological:     General: No focal deficit present.     Mental Status: She is alert.  Psychiatric:        Mood and Affect: Mood normal.       Labs on Admission: I have personally reviewed the patients's labs and imaging studies.  Assessment/Plan Principal Problem:   Acute kidney injury   # Prerenal AKI - Patient's baseline creatinine 1 1 - 2.7 on presentation - Had annual physical and noted to have AKI -Patient has had poor po intake in setting of dementia  Plan: Continue IV fluid Check labs in morning  # HyperLipidemia-continue Lipitor  # Severe Cog impairment-continue Aricept , Namenda   # Insomnia-continue  mirtazapine   # Hypothyroidism-continue levothyroxine    Admission status: Observation Med-Surg  Certification: The appropriate patient status for this patient is OBSERVATION. Observation status is judged to be reasonable and necessary in order to provide the required intensity of service to ensure the patient's safety. The patient's presenting symptoms, physical exam findings, and initial radiographic and laboratory data in the context of their medical condition is felt to place them at decreased risk for further clinical deterioration. Furthermore, it is anticipated that the patient will be medically stable for discharge from the hospital within 2 midnights of  admission.     Lamar Dess MD [facility] If 7PM-7AM, please contact night-coverage [email or web address]  08/03/2024, 11:39 PM

## 2024-08-03 NOTE — Progress Notes (Signed)
 Chief Complaint  Patient presents with   Medicare Wellness     Subjective:   Debbie Brandt is a 63 y.o. female who presents for a Medicare Annual Wellness Visit.  Persons Participating in Visit: Patient and Brother, Lyn[facility]).  Allergies (verified) Voltaren  [diclofenac  sodium]   History: Past Medical History:  Diagnosis Date   Chest pain 09/26/2015   Depression    Essential hypertension 09/26/2015   GERD (gastroesophageal reflux disease)    Hypertension    Hypothyroidism 09/26/2015   Murmur 09/26/2015   Thyroid disease    Past Surgical History:  Procedure Laterality Date   ABDOMINAL HYSTERECTOMY     CESAREAN SECTION     LAPAROSCOPIC ABDOMINAL EXPLORATION N/A 11/21/2017   Procedure: LAPAROSCOPIC ABDOMINAL EXPLORATION LYSIS OF ADHESIONS;  Surgeon: Aron Shoulders, MD;  Location: [facility];  Service: General;  Laterality: N/A;   LAPAROTOMY N/A 10/28/2016   Procedure: EXPLORATORY LAPAROTOMY WITH  LYSIS OF ADHESIONS;  Surgeon: Krystal Russell, MD;  Location: [facility];  Service: General;  Laterality: N/A;   Family History  Problem Relation Age of Onset   Cancer Mother        lung cancer   Hypertension Mother    Cancer Father    Heart attack Father    Mental illness Sister    Hypertension Sister    Hyperlipidemia Sister    Dementia Sister    Diabetes Brother    Hypertension Brother    Social History   Occupational History   Not on file  Tobacco Use   Smoking status: Never   Smokeless tobacco: Never  Vaping Use   Vaping status: Never Used  Substance and Sexual Activity   Alcohol use: No    Alcohol/week: 0.0 standard drinks of alcohol   Drug use: No   Sexual activity: Not Currently    Comment: 1st intercourse- 15, partners3, widow   Tobacco Counseling Counseling given: Not Answered  SDOH Screenings   Food Insecurity: No Food Insecurity (08/03/2024)  Housing: Unknown (08/03/2024)  Transportation Needs: No Transportation Needs (08/03/2024)  Utilities: Not  At Risk (08/03/2024)  Depression (PHQ2-9): Low Risk  (08/03/2024)  Physical Activity: Inactive (08/03/2024)  Social Connections: Socially Isolated (08/03/2024)  Stress: No Stress Concern Present (08/03/2024)  Tobacco Use: Low Risk  (08/03/2024)  Health Literacy: Adequate Health Literacy (08/03/2024)   See flowsheets for full screening details  Depression Screen PHQ 2 & 9 Depression Scale- Over the past 2 weeks, how often have you been bothered by any of the following problems? Little interest or pleasure in doing things: 0 Feeling down, depressed, or hopeless (PHQ Adolescent also includes...irritable): 0 PHQ-2 Total Score: 0 Trouble falling or staying asleep, or sleeping too much: 0 Feeling tired or having little energy: 0 Poor appetite or overeating (PHQ Adolescent also includes...weight loss): 0 Feeling bad about yourself - or that you are a failure or have let yourself or your family down: 0 Trouble concentrating on things, such as reading the newspaper or watching television (PHQ Adolescent also includes...like school work): 3 Moving or speaking so slowly that other people could have noticed. Or the opposite - being so fidgety or restless that you have been moving around a lot more than usual: 1 Thoughts that you would be better off dead, or of hurting yourself in some way: 0 PHQ-9 Total Score: 4 If you checked off any problems, how difficult have these problems made it for you to do your work, take care of things at home, or  get along with other people?: Extremely dIfficult  Depression Treatment Depression Interventions/Treatment : Medication; Currently on Treatment; PHQ2-9 Score <4 Follow-up Not Indicated (Brother assists)     Goals Addressed               This Visit's Progress     Patient Stated (pt-stated)        Patient stated she plans to continue walking       Visit info / Clinical Intake: Medicare Wellness Visit Type:: Initial Annual Wellness Visit Persons  participating in visit:: patient Medicare Wellness Visit Mode:: In-person (required for WTM) Information given by:: patient; family Interpreter Needed?: No Pre-visit prep was completed: yes AWV questionnaire completed by patient prior to visit?: no Living arrangements:: with family/others (resides with Brother) Patient's Overall Health Status Rating: (!) fair Typical amount of pain: none Does pain affect daily life?: no Are you currently prescribed opioids?: no  Dietary Habits and Nutritional Risks How many meals a day?: 2 Eats fruit and vegetables daily?: yes Most meals are obtained by: having others provide food In the last 2 weeks, have you had any of the following?: none Diabetic:: no  Functional Status Activities of Daily Living (to include ambulation/medication): (!) Dependent Feeding: Needs assistance (Brother assists) Dressing/Grooming: Dependent (Brother assists) Bathing: Dependent (Brother assists) Toileting: Dependent (Brother assists) Transfer: Independent Ambulation: Independent with device- listed below Home Assistive Devices/Equipment: Eyeglasses Medication Administration: Dependent (Brother assists) Is this a change from baseline?: Change from baseline, expected to last >3 days Home Management: Dependent (Brother assists) Manage your own finances?: (!) no (Brother assists) Primary transportation is: family/friends Concerns about hearing?: no  Fall Screening Falls in the past year?: 0 Number of falls in past year: 0 Was there an injury with Fall?: 0 Fall Risk Category Calculator: 0 Patient Fall Risk Level: Low Fall Risk  Fall Risk Patient at Risk for Falls Due to: No Fall Risks Fall risk Follow up: Falls evaluation completed; Falls prevention discussed  Home and Transportation Safety: All rugs have non-skid backing?: yes All stairs or steps have railings?: yes Grab bars in the bathtub or shower?: yes Have non-skid surface in bathtub or shower?:  yes Good home lighting?: yes Regular seat belt use?: yes Hospital stays in the last year:: no  Cognitive Assessment Difficulty concentrating, remembering, or making decisions? : yes Will 6CIT or Mini Cog be Completed: no 6CIT or Mini Cog Declined: patient has a diagnosis of dementia or cognitive impairment  Advance Directives (For Healthcare) Does Patient Have a Medical Advance Directive?: Yes Does patient want to make changes to medical advance directive?: Yes (Inpatient - patient requests chaplain consult to change a medical advance directive) Type of Advance Directive: Healthcare Power of [place]; Living will Copy of Healthcare Power of Attorney in Chart?: No - copy requested Copy of Living Will in Chart?: No - copy requested  Reviewed/Updated  Reviewed/Updated: Reviewed All (Medical, Surgical, Family, Medications, Allergies, Care Teams, Patient Goals)        Objective:    Today's Vitals   08/03/24 1026  BP: 118/68  Pulse: 65  SpO2: 98%  Weight: 157 lb (71.2 kg)  Height: 5' 4 (1.626 m)   Body mass index is 26.95 kg/m.  Current Medications (verified) Outpatient Encounter Medications as of 08/03/2024  Medication Sig   atorvastatin  (LIPITOR) 20 MG tablet TAKE 1 TABLET BY MOUTH DAILY.   cephALEXin (KEFLEX) 500 MG capsule Take 1 capsule (500 mg total) by mouth 2 (two) times daily.   cyanocobalamin (VITAMIN B12) 1000  MCG tablet Take 1,000 mcg by mouth daily.   donepezil  (ARICEPT ) 10 MG tablet Take 1 tablet (10 mg total) by mouth at bedtime.   levothyroxine  (SYNTHROID ) 75 MCG tablet TAKE 1 TABLET BY MOUTH EVERY MORNING BEFORE BREAKFAST   losartan  (COZAAR ) 50 MG tablet Take 1 tablet (50 mg total) by mouth daily.   memantine  (NAMENDA ) 10 MG tablet Take 1 tablet (10 mg total) by mouth 2 (two) times daily.   mirtazapine  (REMERON  SOL-TAB) 15 MG disintegrating tablet DISSOLVE 1 TABLET ON TONGUE EVERY NIGHT AT BEDTIME   triamterene -hydrochlorothiazide  (DYAZIDE ) 37.5-25 MG  capsule TAKE 1 CAPSULE BY MOUTH DAILY   No facility-administered encounter medications on file as of 08/03/2024.   Hearing/Vision screen Hearing Screening - Comments:: Denies hearing difficulties   Vision Screening - Comments:: Wears rx glasses - up to date with routine eye exams  Immunizations and Health Maintenance Health Maintenance  Topic Date Due   Hepatitis C Screening  Never done   Zoster Vaccines- Shingrix (1 of 2) 12/03/2008   Mammogram  05/08/2014   DEXA SCAN  Never done   COVID-19 Vaccine (3 - 2025-26 season) 05/17/2024   Cervical Cancer Screening (HPV/Pap Cotest)  07/29/2024   Colonoscopy  08/03/2025 (Originally 12/04/2003)   Medicare Annual Wellness (AWV)  08/03/2025   DTaP/Tdap/Td (3 - Td or Tdap) 05/09/2031   Pneumococcal Vaccine: 50+ Years  Completed   Influenza Vaccine  Completed   HIV Screening  Completed   Hepatitis B Vaccines 19-59 Average Risk  Aged Out   Meningococcal B Vaccine  Aged Out        Assessment/Plan:  This is a routine wellness examination for Deyja.  Patient Care Team: Lendia Boby CROME, NP-C as PCP - General (Family Medicine)  I have personally reviewed and noted the following in the patient's chart:   Medical and social history Use of alcohol, tobacco or illicit drugs  Current medications and supplements including opioid prescriptions. Functional ability and status Nutritional status Physical activity Advanced directives List of other physicians Hospitalizations, surgeries, and ER visits in previous 12 months Vitals Screenings to include cognitive, depression, and falls Referrals and appointments  Orders Placed This Encounter  Procedures   DG Bone Density    Standing Status:   Future    Expiration Date:   08/03/2025    Reason for Exam (SYMPTOM  OR DIAGNOSIS REQUIRED):   post menopausal estrogen deficient    Preferred imaging location?:   [address]   MM 3D SCREENING MAMMOGRAM BILATERAL BREAST    Standing Status:    Future    Expiration Date:   08/03/2025    Reason for Exam (SYMPTOM  OR DIAGNOSIS REQUIRED):   screening for breast cancer    Preferred imaging location?:   [facility]   Pneumococcal conjugate vaccine 20-valent (Prevnar 20)   Flu vaccine HIGH DOSE PF(Fluzone Trivalent)   Cologuard   Hepatitis C antibody    Standing Status:   Future    Expiration Date:   08/03/2025   In addition, I have reviewed and discussed with patient certain preventive protocols, quality metrics, and best practice recommendations. A written personalized care plan for preventive services as well as general preventive health recommendations were provided to patient.   Verdie CHRISTELLA Saba, CMA   08/03/2024   Return in 1 year (on 08/03/2025).  After Visit Summary: (In Person-Declined) Patient declined AVS at this time.  Nurse Notes: Ordered Cologuard, MMG, DEXA Scan, and Hepatitis C Screening.  Scheduled 2026 AWV/CPE appts.

## 2024-08-03 NOTE — Progress Notes (Signed)
 Patient and brother Sydnee) called and discussed the importance of evaluation in the ED for acute renal failure. [facility] will take patient to the ED. Please check on patient Wednesday.

## 2024-08-03 NOTE — Patient Instructions (Signed)
 Debbie Brandt,  Thank you for taking the time for your Medicare Wellness Visit. I appreciate your continued commitment to your health goals. Please review the care plan we discussed, and feel free to reach out if I can assist you further.  Please note that Annual Wellness Visits do not include a physical exam. Some assessments may be limited, especially if the visit was conducted virtually. If needed, we may recommend an in-person follow-up with your provider.  Ongoing Care Seeing your primary care provider every 3 to 6 months helps us  monitor your health and provide consistent, personalized care.   Referrals If a referral was made during today's visit and you haven't received any updates within two weeks, please contact the referred provider directly to check on the status.  Recommended Screenings:  Health Maintenance  Topic Date Due   Hepatitis C Screening  Never done   Pneumococcal Vaccine for age over 31 (1 of 1 - PCV) Never done   Zoster (Shingles) Vaccine (1 of 2) 12/03/2008   Breast Cancer Screening  05/08/2014   DEXA scan (bone density measurement)  Never done   Flu Shot  04/16/2024   COVID-19 Vaccine (3 - 2025-26 season) 05/17/2024   Pap with HPV screening  07/29/2024   Colon Cancer Screening  08/03/2025*   Medicare Annual Wellness Visit  08/03/2025   DTaP/Tdap/Td vaccine (3 - Td or Tdap) 05/09/2031   HIV Screening  Completed   Hepatitis B Vaccine  Aged Out   Meningitis B Vaccine  Aged Out  *Topic was postponed. The date shown is not the original due date.       08/03/2024   10:30 AM  Advanced Directives  Does Patient Have a Medical Advance Directive? Yes  Type of Estate Agent of [place];Living will  Does patient want to make changes to medical advance directive? Yes (Inpatient - patient requests chaplain consult to change a medical advance directive)  Copy of Healthcare Power of Attorney in Chart? No - copy requested    Vision: Annual vision  screenings are recommended for early detection of glaucoma, cataracts, and diabetic retinopathy. These exams can also reveal signs of chronic conditions such as diabetes and high blood pressure.  Dental: Annual dental screenings help detect early signs of oral cancer, gum disease, and other conditions linked to overall health, including heart disease and diabetes.  P

## 2024-08-04 DIAGNOSIS — Z79899 Other long term (current) drug therapy: Secondary | ICD-10-CM | POA: Diagnosis not present

## 2024-08-04 DIAGNOSIS — F32A Depression, unspecified: Secondary | ICD-10-CM | POA: Diagnosis present

## 2024-08-04 DIAGNOSIS — Z7189 Other specified counseling: Secondary | ICD-10-CM

## 2024-08-04 DIAGNOSIS — Z888 Allergy status to other drugs, medicaments and biological substances status: Secondary | ICD-10-CM | POA: Diagnosis not present

## 2024-08-04 DIAGNOSIS — Z90710 Acquired absence of both cervix and uterus: Secondary | ICD-10-CM | POA: Diagnosis not present

## 2024-08-04 DIAGNOSIS — E785 Hyperlipidemia, unspecified: Secondary | ICD-10-CM | POA: Diagnosis present

## 2024-08-04 DIAGNOSIS — G4700 Insomnia, unspecified: Secondary | ICD-10-CM | POA: Diagnosis present

## 2024-08-04 DIAGNOSIS — E039 Hypothyroidism, unspecified: Secondary | ICD-10-CM | POA: Diagnosis present

## 2024-08-04 DIAGNOSIS — I10 Essential (primary) hypertension: Secondary | ICD-10-CM | POA: Diagnosis present

## 2024-08-04 DIAGNOSIS — N179 Acute kidney failure, unspecified: Secondary | ICD-10-CM | POA: Diagnosis present

## 2024-08-04 DIAGNOSIS — Z818 Family history of other mental and behavioral disorders: Secondary | ICD-10-CM | POA: Diagnosis not present

## 2024-08-04 DIAGNOSIS — Z8249 Family history of ischemic heart disease and other diseases of the circulatory system: Secondary | ICD-10-CM | POA: Diagnosis not present

## 2024-08-04 DIAGNOSIS — Z79890 Hormone replacement therapy (postmenopausal): Secondary | ICD-10-CM | POA: Diagnosis not present

## 2024-08-04 DIAGNOSIS — D649 Anemia, unspecified: Secondary | ICD-10-CM | POA: Diagnosis present

## 2024-08-04 DIAGNOSIS — F03C3 Unspecified dementia, severe, with mood disturbance: Secondary | ICD-10-CM | POA: Diagnosis present

## 2024-08-04 LAB — BASIC METABOLIC PANEL WITH GFR
Anion gap: 9 (ref 5–15)
BUN: 35 mg/dL — ABNORMAL HIGH (ref 8–23)
CO2: 25 mmol/L (ref 22–32)
Calcium: 9 mg/dL (ref 8.9–10.3)
Chloride: 109 mmol/L (ref 98–111)
Creatinine, Ser: 2.13 mg/dL — ABNORMAL HIGH (ref 0.44–1.00)
GFR, Estimated: 25 mL/min — ABNORMAL LOW (ref >60)
Glucose, Bld: 86 mg/dL (ref 70–99)
Potassium: 4.1 mmol/L (ref 3.5–5.1)
Sodium: 142 mmol/L (ref 135–145)

## 2024-08-04 LAB — PHOSPHORUS: Phosphorus: 2.9 mg/dL (ref 2.5–4.6)

## 2024-08-04 LAB — MAGNESIUM: Magnesium: 1.5 mg/dL — ABNORMAL LOW (ref 1.7–2.4)

## 2024-08-04 LAB — HEPATITIS C ANTIBODY: Hepatitis C Ab: NONREACTIVE

## 2024-08-04 MED ORDER — MAGNESIUM SULFATE 2 GM/50ML IV SOLN
2.0000 g | Freq: Once | INTRAVENOUS | Status: AC
  Administered 2024-08-04: 2 g via INTRAVENOUS
  Filled 2024-08-04: qty 50

## 2024-08-04 MED ORDER — ENSURE PLUS HIGH PROTEIN PO LIQD
237.0000 mL | Freq: Two times a day (BID) | ORAL | Status: DC
  Administered 2024-08-04 – 2024-08-05 (×3): 237 mL via ORAL

## 2024-08-04 MED ORDER — ORAL CARE MOUTH RINSE: 15.0000 mL | OROMUCOSAL | Status: DC | PRN

## 2024-08-04 NOTE — Plan of Care (Signed)
  Problem: Clinical Measurements: Goal: Will remain free from infection Outcome: Progressing Goal: Cardiovascular complication will be avoided Outcome: Progressing   Problem: Nutrition: Goal: Adequate nutrition will be maintained Outcome: Progressing

## 2024-08-04 NOTE — Assessment & Plan Note (Signed)
 Hold losartan , Dyazide  given AKI

## 2024-08-04 NOTE — Hospital Course (Addendum)
 63yo with h/o severe dementia, HTN, and HLD who presented on 11/18 with AKI associated with poor PO intake.  Her brother cares for her in the home and appears to need more education about appropriate daily intake.  Nutrition consulted.  Palliative care consulted.

## 2024-08-04 NOTE — Telephone Encounter (Signed)
 Form completed, ATC brother for pick up but received message call cannot be completed at this time

## 2024-08-04 NOTE — Assessment & Plan Note (Signed)
 -  Continue Lipitor

## 2024-08-04 NOTE — Assessment & Plan Note (Signed)
 Continue Synthroid 

## 2024-08-04 NOTE — Assessment & Plan Note (Signed)
 Likely nutritional Repleted Recheck in AM

## 2024-08-04 NOTE — Consult Note (Signed)
 Palliative Medicine Inpatient Consult Note  Consulting Provider: Dr. Barbarann  Reason for consult:   Palliative Care Consult Services Palliative Medicine Consult  Reason for Consult? goals of care   08/04/2024  HPI:  Per intake H&P --> 63yo with h/o severe dementia, HTN, and HLD who presented on 11/18 with AKI associated with poor PO intake. Her brother cares for her in the home and appears to need more education about appropriate daily intake. Nutrition consulted. Palliative care consulted.   Clinical Assessment/Goals of Care:  *Please note that this is a verbal dictation therefore any spelling or grammatical errors are due to the Dragon Medical One system interpretation.  I have reviewed medical records including EPIC notes, labs and imaging, received report from bedside RN, assessed the patient who is lying in bed in NAD - she is aware of her name only.    I met with patients brother, Debbie Brandt to further discuss diagnosis prognosis, GOC, EOL wishes, disposition and options.   I introduced Palliative Medicine as specialized medical care for people living with serious illness. It focuses on providing relief from the symptoms and stress of a serious illness. The goal is to improve quality of life for both the patient and the family.  Medical History Review and Understanding:  Debbie Brandt has a past medical history significant for dementia, HTN, HLD, depression, GERD, hypertension, and thyroid disease.   Social History:  Debbie Brandt lives in [place], [place] . She is a widow, her spouse died roughly 13 years ago. She had a son who was sickly and he passed away roughly nine years ago. She is one of three children and her sister also died of complications associated with dementia. She formerly worked at [employer]. She enjoys spending time with her brother. She is a woman of faith and goes to the [employer].   Functional and Nutritional State:  Prior to hospitalization,  Debbie Brandt had been living in a single family home with her brother, Debbie Brandt who is her primary caregiver. Debbie Brandt is able to walk though for the last year Debbie Brandt has had to help her with bathing, dressing, and feeding. Per Debbie Brandt Montie does eat what he gives her.   Advance Directives:  A detailed discussion was had today regarding advanced directives.  Patient has not AD's and her only surviving sibling is her brother therefore he is her surrogate management consultant.   Code Status:  Concepts specific to code status, artifical feeding and hydration, continued IV antibiotics and rehospitalization was had.  The difference between a aggressive medical intervention path  and a palliative comfort care path for this patient at this time was had.   Encouraged patient/family to consider DNR/DNI status understanding evidenced based poor outcomes in similar hospitalized patient, as the cause of arrest is likely associated with advanced chronic/terminal illness rather than an easily reversible acute cardio-pulmonary event. I explained that DNR/DNI does not change the medical plan and it only comes into effect after a person has arrested (died).  It is a protective measure to keep us  from harming the patient in their last moments of life.   Debbie Brandt shares understanding though wants to, talk about this around the table on Thanksgiving with his other family members.  Have provided an informational handout on CPR - Information for Patients and Families about CPR  Discussion:  Debbie Brandt shares that Debbie Brandt was functioning fairly prior to the loss of her son which is when her memory symptoms became more pronounces. He states that she really declined  after this. She has required help around the clock for roughly the last year and her ability to perform badls has deteriorated. Debbie Brandt shares that he does provide extensive care for her in the home environment.   We discussed dementia and the chronic progressive nature of the  diease. We reviewed that the disease itself of the associated complications of the disease are often what lead to mortality.   From a nutritional and hydration perspective I shared it is not uncommon for patients to stop eating and drinking. It often signifies the late stages of the disease, and can be a combination of factors such as difficulty swallowing, loss of appetite due to depression or physical discomfort, and a reduced ability to recognize food or the act of eating itself. I shared patients do not suffer as a result of this.  We discussed the possible progression of this in the near future and potential decisions which would need to be made if disease progression continued. Debbie Brandt shares understanding though also notes needing time to think about all of this.  Debbie Brandt and I discussed OP Palliative support to continue these conversation though at this time he wants to consider this only.   Discussed the importance of continued conversation with family and their  medical providers regarding overall plan of care and treatment options, ensuring decisions are within the context of the patients values and GOCs.  Decision Maker: Brandt,Debbie (Brother): [phone] (Mobile)   SUMMARY OF RECOMMENDATIONS   Full Code / Full scope of care  Continue present measures, allowing time for outcomes  Open and honest conversations held in the setting of dementia, its progression, and the effect it has on nutrition and hydration  Patients brother would like to think about OP Palliative care and presently does not want to commit to this  The PMT will continue to follow along and offer support as needed  Code Status/Advance Care Planning: FULL CODE   Palliative Prophylaxis:  Aspiration, Bowel Regimen, Delirium Protocol, Frequent Pain Assessment, Oral Care, Palliative Wound Care, and Turn Reposition  Additional Recommendations (Limitations, Scope, Preferences): Continue current  care  Psycho-social/Spiritual:  Desire for further Chaplaincy support: Not presently Additional Recommendations: Discussed progressive nature of dementia   Prognosis: Limited overall  Discharge Planning: Discharge to home with brother once medically optimized.   Vitals:   08/04/24 0741 08/04/24 1334  BP: (!) 115/97 (!) 108/54  Pulse: 65 67  Resp: 18 16  Temp: 97.6 F (36.4 C) 98.2 F (36.8 C)  SpO2: 100% 100%   No intake or output data in the 24 hours ending 08/04/24 1432 Last Weight  Most recent update: 08/03/2024  6:01 PM    Weight  71.2 kg (156 lb 15.5 oz)             LABS: CBC:    Component Value Date/Time   WBC 6.6 08/03/2024 1928   HGB 10.5 (L) 08/03/2024 1934   HCT 31.0 (L) 08/03/2024 1934   PLT 312 08/03/2024 1928   MCV 87.5 08/03/2024 1928   MCV 83.9 11/26/2014 1300   NEUTROABS 5.1 08/03/2024 1928   LYMPHSABS 0.9 08/03/2024 1928   MONOABS 0.5 08/03/2024 1928   EOSABS 0.0 08/03/2024 1928   BASOSABS 0.0 08/03/2024 1928   Comprehensive Metabolic Panel:    Component Value Date/Time   NA 142 08/04/2024 0428   K 4.1 08/04/2024 0428   CL 109 08/04/2024 0428   CO2 25 08/04/2024 0428   BUN 35 (H) 08/04/2024 0428   CREATININE 2.13 (  H) 08/04/2024 0428   CREATININE 0.83 09/26/2015 0955   GLUCOSE 86 08/04/2024 0428   CALCIUM  9.0 08/04/2024 0428   AST 18 08/03/2024 1057   ALT 26 08/03/2024 1057   ALKPHOS 74 08/03/2024 1057   BILITOT 0.4 08/03/2024 1057   PROT 7.4 08/03/2024 1057   ALBUMIN  4.2 08/03/2024 1057   Gen:  Elderly AA F in NAD HEENT: moist mucous membranes CV: Regular rate and rhythm  PULM:  On RA, breathing is even and nonlabored ABD: soft/nontender  EXT: No edema  Neuro: Alert to self  PPS: 40%   This conversation/these recommendations were discussed with patient primary care team, Dr. Barbarann ______________________________________________________ Rosaline Becton [facility] Palliative Medicine Team Team Cell Phone:  [phone] Please utilize secure chat with additional questions, if there is no response within 30 minutes please call the above phone number  Total Time: 75 Billing based on MDM: High  Palliative Medicine Team providers are available by phone from 7am to 7pm daily and can be reached through the team cell phone.  Should this patient require assistance outside of these hours, please call the patient's attending physician.

## 2024-08-04 NOTE — Progress Notes (Signed)
 Progress Note   Patient: Debbie Brandt FMW:[id number] DOB: [birth year] DOA: 08/03/2024     0 DOS: the patient was seen and examined on 08/04/2024   Brief hospital course: 63yo with h/o severe dementia, HTN, and HLD who presented on 11/18 with AKI associated with poor PO intake.  Her brother cares for her in the home and appears to need more education about appropriate daily intake.  Nutrition consulted.  Palliative care consulted.     Assessment & Plan Acute kidney injury Baseline creatinine about 1 Creatinine was 2.7 on presentation, improving and currently 2.13 Incidental finding of AKI at annual physical  Patient has had poor po intake in setting of dementia, also on ARB Improving with IVF Dietician consulted for nutrition evaluation and education Hypomagnesemia Likely nutritional Repleted Recheck in AM HTN (hypertension) Hold losartan , Dyazide  given AKI Cognitive impairment continue Aricept , Namenda , mirtazepine  Hypothyroidism Continue Synthroid  Hyperlipidemia Continue Lipitor Goals of care, counseling/discussion Discussed with her brother, who is her caregiver He reports that patient is full code but then on further discussion would have to discuss with family about whether she would be ok with life support Palliative care consulted Given her progressive cognitive impairment in conjunction with poor PO intake, she may be approaching terminal dementia      Consultants: Palliative care Dietician  Procedures: None  Antibiotics: None      Subjective: Oriented to person, pleasant but mostly nonverbal and not interactive.   Objective: Vitals:   08/04/24 0741 08/04/24 1334  BP: (!) 115/97 (!) 108/54  Pulse: 65 67  Resp: 18 16  Temp: 97.6 F (36.4 C) 98.2 F (36.8 C)  SpO2: 100% 100%   No intake or output data in the 24 hours ending 08/04/24 1416 Filed Weights   08/03/24 1801  Weight: 71.2 kg    Exam:  General:  Appears calm and comfortable  and is in NAD, up in chair Eyes:  normal lids, iris ENT:  grossly normal hearing, lips & tongue, mmm Cardiovascular:  RRR. No LE edema.  Respiratory:   CTA bilaterally with no wheezes/rales/rhonchi.  Normal respiratory effort. Abdomen:  soft, NT, ND Skin:  no rash or induration seen on limited exam Musculoskeletal:  grossly normal tone BUE/BLE, good ROM, no bony abnormality Psychiatric:  blunted mood and affect, speech very  limited, AOx1 Neurologic:  CN 2-12 grossly intact  Data Reviewed: I have reviewed the patient's lab results since admission.  Pertinent labs for today include:   BUN 35/Creatinine 2.13/GFR 25 Mag++ 1.5     Family Communication: Brother was present      Code Status: Full Code  Disposition: Status is: Inpatient Admit - It is my clinical opinion that admission to INPATIENT is reasonable and necessary because of the expectation that this patient will require hospital care that crosses at least 2 midnights to treat this condition based on the medical complexity of the problems presented.  Given the aforementioned information, the predictability of an adverse outcome is felt to be significant.      Time spent: 50 minutes  Unresulted Labs (From admission, onward)     Start     Ordered   08/05/24 0500  Basic metabolic panel with GFR  Tomorrow morning,   R        08/04/24 1416   08/05/24 0500  Magnesium   Tomorrow morning,   R        08/04/24 1416   08/05/24 0500  CBC  Tomorrow morning,   R  08/04/24 1416             Author: Delon Herald, MD 08/04/2024 2:16 PM  For on call review [email or web address].

## 2024-08-04 NOTE — Telephone Encounter (Signed)
 Spoke to brother and advised ready for pick up

## 2024-08-04 NOTE — Assessment & Plan Note (Addendum)
 Baseline creatinine about 1 Creatinine was 2.7 on presentation, improving and currently 2.13 Incidental finding of AKI at annual physical  Patient has had poor po intake in setting of dementia, also on ARB Improving with IVF Dietician consulted for nutrition evaluation and education

## 2024-08-04 NOTE — Assessment & Plan Note (Signed)
 Discussed with her brother, who is her caregiver He reports that patient is full code but then on further discussion would have to discuss with family about whether she would be ok with life support Palliative care consulted Given her progressive cognitive impairment in conjunction with poor PO intake, she may be approaching terminal dementia

## 2024-08-04 NOTE — Assessment & Plan Note (Addendum)
 continue Aricept , Namenda , mirtazepine

## 2024-08-04 NOTE — Plan of Care (Signed)

## 2024-08-05 DIAGNOSIS — N179 Acute kidney failure, unspecified: Secondary | ICD-10-CM | POA: Diagnosis not present

## 2024-08-05 DIAGNOSIS — D649 Anemia, unspecified: Secondary | ICD-10-CM | POA: Insufficient documentation

## 2024-08-05 DIAGNOSIS — R638 Other symptoms and signs concerning food and fluid intake: Secondary | ICD-10-CM | POA: Insufficient documentation

## 2024-08-05 LAB — CBC
HCT: 24 % — ABNORMAL LOW (ref 36.0–46.0)
Hemoglobin: 7.6 g/dL — ABNORMAL LOW (ref 12.0–15.0)
MCH: 27.9 pg (ref 26.0–34.0)
MCHC: 31.7 g/dL (ref 30.0–36.0)
MCV: 88.2 fL (ref 80.0–100.0)
Platelets: 235 K/uL (ref 150–400)
RBC: 2.72 MIL/uL — ABNORMAL LOW (ref 3.87–5.11)
RDW: 14.2 % (ref 11.5–15.5)
WBC: 4.8 K/uL (ref 4.0–10.5)
nRBC: 0 % (ref 0.0–0.2)

## 2024-08-05 LAB — BASIC METABOLIC PANEL WITH GFR
Anion gap: 8 (ref 5–15)
BUN: 35 mg/dL — ABNORMAL HIGH (ref 8–23)
CO2: 25 mmol/L (ref 22–32)
Calcium: 9 mg/dL (ref 8.9–10.3)
Chloride: 110 mmol/L (ref 98–111)
Creatinine, Ser: 1.87 mg/dL — ABNORMAL HIGH (ref 0.44–1.00)
GFR, Estimated: 29 mL/min — ABNORMAL LOW (ref >60)
Glucose, Bld: 98 mg/dL (ref 70–99)
Potassium: 4.1 mmol/L (ref 3.5–5.1)
Sodium: 143 mmol/L (ref 135–145)

## 2024-08-05 LAB — MAGNESIUM: Magnesium: 1.9 mg/dL (ref 1.7–2.4)

## 2024-08-05 MED ORDER — ENSURE PLUS HIGH PROTEIN PO LIQD: 237.0000 mL | Freq: Two times a day (BID) | ORAL | 0 refills | Status: AC

## 2024-08-05 NOTE — Assessment & Plan Note (Signed)
 Continue Synthroid 

## 2024-08-05 NOTE — Plan of Care (Signed)

## 2024-08-05 NOTE — Assessment & Plan Note (Addendum)
 Likely nutritional Repleted

## 2024-08-05 NOTE — Assessment & Plan Note (Deleted)
 -  Continue Lipitor

## 2024-08-05 NOTE — Assessment & Plan Note (Deleted)
 continue Aricept , Namenda , mirtazepine

## 2024-08-05 NOTE — Assessment & Plan Note (Deleted)
 Continue Synthroid 

## 2024-08-05 NOTE — Assessment & Plan Note (Deleted)
 Likely nutritional Repleted Recheck in AM

## 2024-08-05 NOTE — Assessment & Plan Note (Addendum)
 Severe impairment Continue Aricept , Namenda , mirtazepine  Based on presenting FTT, she may be approaching terminal dementia

## 2024-08-05 NOTE — Discharge Summary (Signed)
 Physician Discharge Summary   Patient: Debbie Brandt MRN: [id number] DOB: [birth year]  Admit date:     08/03/2024  Discharge date: 08/05/24  Discharge Physician: Delon Herald   PCP: Lendia Boby LITTIE, NP-C   Recommendations at discharge:   Follow nutritional guidance as provided by the dietician Follow up with NP Lendia next week for recheck and labs (CBC, BMP) Consider outpatient palliative care consultation as well as ongoing goals of care conversation Continue to hold Losartan  and Dyazide  until PCP follow up  Discharge Diagnoses: Principal Problem:   Acute kidney injury Active Problems:   HTN (hypertension)   Hypothyroidism   Cognitive impairment   Hyperlipidemia   Goals of care, counseling/discussion   Hypomagnesemia   Normocytic anemia   Inadequate oral intake    Hospital Course: 63yo with h/o severe dementia, HTN, and HLD who presented on 11/18 with AKI associated with poor PO intake.  Her brother cares for her in the home and appears to need more education about appropriate daily intake.  Nutrition consulted.  Palliative care consulted.  Assessment and Plan:  Assessment & Plan Acute kidney injury Baseline creatinine about 1 Creatinine was 2.7 on presentation, improving and currently 1.87 Incidental finding of AKI at annual physical  Patient has had poor po intake in setting of dementia, also on ARB Improved with IVF; now off IVF and continuing to improve Dietician consulted for nutrition evaluation and education Continue to hold Losartan  and Dyazide  until PCP f/u Inadequate oral intake Nutrition Problem: Inadequate oral intake (fluid) Etiology: chronic illness (severe dementia) Signs/Symptoms: per patient/family report, other (comment) (admit for AKI) Interventions: Refer to RD note for recommendations, Ensure Enlive (each supplement provides 350kcal and 20 grams of protein), Education Hypomagnesemia Likely nutritional Repleted HTN (hypertension) Hold  losartan , Dyazide  given AKI Cognitive impairment Severe impairment Continue Aricept , Namenda , mirtazepine  Based on presenting FTT, she may be approaching terminal dementia Hypothyroidism Continue Synthroid  Hyperlipidemia Continue Lipitor Goals of care, counseling/discussion Discussed with her brother, who is her caregiver He reports that patient is full code but then on further discussion would have to discuss with family about whether she would be ok with life support Palliative care consulted Given her progressive cognitive impairment in conjunction with poor PO intake, she may be approaching terminal dementia Encourage ongoing GOC conversations  Consider outpatient palliative care support Normocytic anemia Presenting Hgb 10 Dropped to 7.6 today No overt bleeding other than overnight when she pulled out her IV and dripped along the floor Asymptomatic Will dc today with plan for repeat labs next week      Consultants: Palliative care Dietician   Procedures: None   Antibiotics: None     Pain control - [place]  Controlled Substance Reporting System database was reviewed. and patient was instructed, not to drive, operate heavy machinery, perform activities at heights, swimming or participation in water activities or provide baby-sitting services while on Pain, Sleep and Anxiety Medications; until their outpatient Physician has advised to do so again. Also recommended to not to take more than prescribed Pain, Sleep and Anxiety Medications.    Disposition: Home Diet recommendation:  Regular diet DISCHARGE MEDICATION: Allergies as of 08/05/2024       Reactions   Voltaren  [diclofenac  Sodium] Other (See Comments)   Unknown, pt went to pick up at pharmacy and they said she was allergic        Medication List     PAUSE taking these medications    losartan  50 MG tablet Wait to take  this until your doctor or other care provider tells you to start  again. Commonly known as: COZAAR  Take 1 tablet (50 mg total) by mouth daily.   triamterene -hydrochlorothiazide  37.5-25 MG capsule Wait to take this until your doctor or other care provider tells you to start again. Commonly known as: DYAZIDE  TAKE 1 CAPSULE BY MOUTH DAILY       TAKE these medications    atorvastatin  20 MG tablet Commonly known as: LIPITOR TAKE 1 TABLET BY MOUTH DAILY.   cyanocobalamin  1000 MCG tablet Commonly known as: VITAMIN B12 Take 1,000 mcg by mouth daily.   donepezil  10 MG tablet Commonly known as: ARICEPT  Take 1 tablet (10 mg total) by mouth at bedtime.   feeding supplement Liqd Take 237 mLs by mouth 2 (two) times daily between meals.   levothyroxine  75 MCG tablet Commonly known as: SYNTHROID  TAKE 1 TABLET BY MOUTH EVERY MORNING BEFORE BREAKFAST   memantine  10 MG tablet Commonly known as: NAMENDA  Take 1 tablet (10 mg total) by mouth 2 (two) times daily.   mirtazapine  15 MG disintegrating tablet Commonly known as: REMERON  SOL-TAB DISSOLVE 1 TABLET ON TONGUE EVERY NIGHT AT BEDTIME        Discharge Exam:    Subjective: Nursing notes reviewed - patient with overnight wandering into another room, ripped out her IV.  No other apparent bleeding, Hgb lower today.  Brother feels comfortable with dc to home today.    Objective: Vitals:   08/05/24 0537 08/05/24 1128  BP: 124/66 (!) 110/51  Pulse: (!) 56 75  Resp: 17 16  Temp: 98 F (36.7 C) 97.8 F (36.6 C)  SpO2: 100% 100%    Intake/Output Summary (Last 24 hours) at 08/05/2024 1346 Last data filed at 08/05/2024 0604 Gross per 24 hour  Intake 2650.17 ml  Output --  Net 2650.17 ml   Filed Weights   08/03/24 1801  Weight: 71.2 kg    Exam:  General:  Appears calm and comfortable and is in NAD, up in bedside chair Eyes:  normal lids, iris ENT:  grossly normal hearing, lips & tongue, mmm Cardiovascular:  RRR, no m/r/g. No LE edema.  Respiratory:   CTA bilaterally with no  wheezes/rales/rhonchi.  Normal respiratory effort. Abdomen:  soft, NT, ND Skin:  no rash or induration seen on limited exam Musculoskeletal:  grossly normal tone BUE/BLE, good ROM, no bony abnormality Psychiatric:  blunted and unengaged mood and affect, speech absent today Neurologic:  CN 2-12 grossly intact, moves all extremities in coordinated fashion, sensation intact  Data Reviewed: I have reviewed the patient's lab results since admission.  Pertinent labs for today include:  BUN 35/Creatinine 1.87/GFR 29, improving WBC 4.8 Hgb 7.6, down from 9.9     Condition at discharge: fair  The results of significant diagnostics from this hospitalization (including imaging, microbiology, ancillary and laboratory) are listed below for reference.   Imaging Studies: CT ABDOMEN PELVIS WO CONTRAST Result Date: 08/03/2024 EXAM: CT ABDOMEN AND PELVIS WITHOUT CONTRAST 08/03/2024 07:48:11 PM TECHNIQUE: CT of the abdomen and pelvis was performed without the administration of intravenous contrast. Multiplanar reformatted images are provided for review. Automated exposure control, iterative reconstruction, and/or weight-based adjustment of the mA/kV was utilized to reduce the radiation dose to as low as reasonably achievable. COMPARISON: None available. CLINICAL HISTORY: elevated creatinine, decreased urine FINDINGS: LOWER CHEST: No acute abnormality. LIVER: The liver is unremarkable. GALLBLADDER AND BILE DUCTS: Gallbladder is unremarkable. No biliary ductal dilatation. SPLEEN: No acute abnormality. PANCREAS: No acute abnormality.  ADRENAL GLANDS: No acute abnormality. KIDNEYS, URETERS AND BLADDER: Fluid lesion within the left kidney likely represents a simple renal cyst. Simple renal cysts do not require additional follow-up unless clinically indicated due to signs/symptoms. No stones in the kidneys or ureters. No hydronephrosis. No perinephric or periureteral stranding. Urinary bladder is unremarkable. GI AND  BOWEL: Stomach demonstrates no acute abnormality. There is no bowel obstruction. No small or large bowel thickening or dilatation. Appendix is unremarkable. PERITONEUM AND RETROPERITONEUM: No ascites. No free air. VASCULATURE: Aorta is normal in caliber. LYMPH NODES: No lymphadenopathy. REPRODUCTIVE ORGANS: Status post hysterectomy. No adnexal mass. BONES AND SOFT TISSUES: No acute osseous abnormality. Intervertebral disc space vacuum phenomenon at the L5-S1 level. Disc bulge at the L3-L4, L4-L5 and L5-S1 levels. IMPRESSION: 1. No acute findings in the abdomen or pelvis is limited evaluation on this noncontrast study. . 2. Other, non-acute and/or normal findings as above. Electronically signed by: Morgane Naveau MD 08/03/2024 08:30 PM EST RP Workstation: [id number]    Microbiology: No results found for this or any previous visit.  Labs: CBC: Recent Labs  Lab 08/03/24 1057 08/03/24 1928 08/03/24 1934 08/05/24 0500  WBC 4.9 6.6  --  4.8  NEUTROABS 2.5 5.1  --   --   HGB 10.0* 9.9* 10.5* 7.6*  HCT 30.5* 30.8* 31.0* 24.0*  MCV 84.4 87.5  --  88.2  PLT 318.0 312  --  235   Basic Metabolic Panel: Recent Labs  Lab 08/03/24 1057 08/03/24 1928 08/03/24 1934 08/04/24 0428 08/05/24 0500  NA 140 138 140 142 143  K 3.8 4.3 4.2 4.1 4.1  CL 105 104 105 109 110  CO2 25 22  --  25 25  GLUCOSE 105* 100* 96 86 98  BUN 45* 44* 42* 35* 35*  CREATININE 2.70* 2.37* 2.70* 2.13* 1.87*  CALCIUM  9.5 9.5  --  9.0 9.0  MG  --   --   --  1.5* 1.9  PHOS  --   --   --  2.9  --    Liver Function Tests: Recent Labs  Lab 08/03/24 1057  AST 18  ALT 26  ALKPHOS 74  BILITOT 0.4  PROT 7.4  ALBUMIN  4.2   CBG: No results for input(s): GLUCAP in the last 168 hours.  Discharge time spent: greater than 30 minutes.  Signed: Delon Herald, MD [facility] 08/05/2024

## 2024-08-05 NOTE — Assessment & Plan Note (Addendum)
 Discussed with her brother, who is her caregiver He reports that patient is full code but then on further discussion would have to discuss with family about whether she would be ok with life support Palliative care consulted Given her progressive cognitive impairment in conjunction with poor PO intake, she may be approaching terminal dementia Encourage ongoing GOC conversations  Consider outpatient palliative care support

## 2024-08-05 NOTE — Progress Notes (Signed)
 Initial Nutrition Assessment  DOCUMENTATION CODES:   Not applicable  INTERVENTION:  - Regular diet.   - Automatic trays to ensure patient receives 3 melas a day given dementia.   - Ensure Plus High Protein po BID, each supplement provides 350 kcal and 20 grams of protein.  - Provided diet education with handouts for Dehydration and High-Calorie, High-Protein Nutrition Therapy and reviewed with brother.   - Monitor weight trends.   NUTRITION DIAGNOSIS:   Inadequate oral intake (fluid) related to chronic illness (severe dementia) as evidenced by per patient/family report, other (comment) (admit for AKI).  GOAL:   Patient will meet greater than or equal to 90% of their needs  MONITOR:   PO intake, Supplement acceptance, Labs, Weight trends  REASON FOR ASSESSMENT:   Consult Assessment of nutrition requirement/status  ASSESSMENT:   63yo with h/o severe dementia, HTN, and HLD who presented with AKI associated with poor PO intake.   Patient sitting in bedside chair at time of visit, brother sitting in front of patient helping her to eat.   Patient remained silent during visit and brother provided all nutrition history. He is unsure of a UBW but feels she has gained a little weight due to just being at home the past several months and eating well.  Per EMR, patient has had an 11# or 6.5% weight loss in 8.5 months. This is not significant for the time frame.   Brother reports he provides the patient with 2 meals a day at home, lunch and dinner. Gives her 1 bottle of water with each meal but otherwise not much other intake during the day.   He reports the patient has been eating well since admission. There are no meal intakes documented to assess oral intake. Patient has Ensure ordered twice daily and accepted both yesterday.  Provided brother with High-Calorie, High-Protein Nutrition Therapy and Dehydration handouts from the [employer].  Brother  feels the patient eats well at home but notes she probably has not been getting enough fluid. Patient noted to be admitted with AKI. Encouraged brother to increase the amount of water/drinks he provides patient during the day. Discussed having drinks available all throughout he day and not just at meals. Also reviewed food items that count as fluid/have a lot of fluid. Brother endorsed understanding and appreciative of information and handouts.  Patient to discharge home today.     Medications reviewed and include: Namenda , remeron   Labs reviewed:  Creatinine 1.87   NUTRITION - FOCUSED PHYSICAL EXAM:  Unable to complete  Diet Order:   Diet Order             Diet regular Room service appropriate? Yes; Fluid consistency: Thin  Diet effective now                   EDUCATION NEEDS:  Education needs have been addressed  Skin:  Skin Assessment: Reviewed RN Assessment  Last BM:  unknown  Height:  Ht Readings from Last 1 Encounters:  08/03/24 5' 4 (1.626 m)   Weight:  Wt Readings from Last 1 Encounters:  08/03/24 71.2 kg    BMI:  Body mass index is 26.94 kg/m.  Estimated Nutritional Needs:  Kcal:  1500-1800 kcals Protein:  65-80 grams Fluid:  >/= 1.8L    Trude Ned RD, LDN Contact via Secure Chat.

## 2024-08-05 NOTE — Progress Notes (Signed)
 Daily Progress Note   Patient Name: Debbie Brandt       Date: 08/05/2024 DOB: 01/22/1961  Age: 63 y.o. MRN#: [id number] Attending Physician: Barbarann Nest, MD Primary Care Physician: Lendia Boby LITTIE, NP-C Admit Date: 08/03/2024  Reason for Consultation/Follow-up: Establishing goals of care  Subjective: Patient sitting up in chair eating well Brother at bedside No complaints  Length of Stay: 1  Current Medications: Scheduled Meds:   atorvastatin   20 mg Oral Daily   donepezil   10 mg Oral QHS   enoxaparin  (LOVENOX ) injection  30 mg Subcutaneous Q24H   feeding supplement  237 mL Oral BID BM   levothyroxine   75 mcg Oral Q0600   memantine   10 mg Oral BID   mirtazapine   15 mg Oral QHS    Continuous Infusions:  lactated ringers  100 mL/hr at 08/05/24 1030    PRN Meds: acetaminophen  **OR** acetaminophen , ondansetron  **OR** ondansetron  (ZOFRAN ) IV, mouth rinse  Physical Exam Constitutional:      General: She is not in acute distress.    Appearance: She is ill-appearing.  Pulmonary:     Effort: Pulmonary effort is normal.  Skin:    General: Skin is warm and dry.  Neurological:     Mental Status: She is alert.     Comments: Minimally verbal             Vital Signs: BP (!) 110/51 (BP Location: Left Arm)   Pulse 75   Temp 97.8 F (36.6 C) (Oral)   Resp 16   Ht 5' 4 (1.626 m)   Wt 71.2 kg   SpO2 100%   BMI 26.94 kg/m  SpO2: SpO2: 100 % O2 Device: O2 Device: Room Air O2 Flow Rate:    Intake/output summary:  Intake/Output Summary (Last 24 hours) at 08/05/2024 1254 Last data filed at 08/05/2024 0604 Gross per 24 hour  Intake 2650.17 ml  Output --  Net 2650.17 ml   LBM:   Baseline Weight: Weight: 71.2 kg Most recent weight: Weight: 71.2 kg       Palliative  Assessment/Data: PPS 60%      Patient Active Problem List   Diagnosis Date Noted   Hyperlipidemia 08/04/2024   Goals of care, counseling/discussion 08/04/2024   Hypomagnesemia 08/04/2024   Acute kidney injury 08/03/2024   Gastroesophageal reflux disease 03/18/2024   Cognitive impairment 06/27/2022   Poor appetite 06/27/2022   Anxiety 06/27/2022   Visual hallucinations 06/27/2022   Rhinorrhea 06/27/2022   Acute pain of left knee 01/15/2022   Memory loss 01/15/2022   Intermittent palpitations 11/21/2017   Obesity (BMI 30-39.9) 11/21/2017   Small bowel obstruction due to adhesions (HCC) 10/28/2016   Small bowel obstruction s/p ex lap & lysis of adhesions 10/28/2016 10/23/2016   Abdominal mass 10/23/2016   Hypokalemia 10/23/2016   Murmur 09/26/2015   Chest pain 09/26/2015   HTN (hypertension) 09/26/2015   Hypothyroidism 09/26/2015    Palliative Care Assessment & Plan   HPI: Per intake H&P --> 63yo with h/o severe dementia, HTN, and HLD who presented on 11/18 with AKI associated with poor PO intake. Her brother cares for her in the home and appears to need more education about appropriate daily intake.  Nutrition consulted. Palliative care consulted.   Assessment: Follow up with patient and brother today.  Patient is sitting up eating well. When I entered room, family had just received education from dietician regarding appropriate hydration for Ms. Hauge. Family feels confident in meeting her nutrition needs at home.   Reviewed with family conversation with palliative provider yesterday. He tells me there are know questions or concerns. We reviewed that yesterday he told provider he needed time to speak with family members regarding advance care planning. I encouraged him to have these conversations about medical decisions for Ms.Wolff in the future.   We review that outpatient palliative was offered yesterday - he declines this.  Hopeful for dc later today. No additional  palliative needs.   Recommendations/Plan: Full code/full scope Encouraged family to have advance care planning conversations about Ms. Villwock's future Declined outpatient palliative Planning for dc soon  Care plan was discussed with patient, brother, dietician  Thank you for allowing the Palliative Medicine Team to assist in the care of this patient.   Total Time 30 minutes Prolonged Time Billed  no   Time spent includes: Detailed review of medical records (labs, imaging, vital signs), medically appropriate exam, discussion with treatment team, counseling and educating patient, family and/or staff, documenting clinical information, medication management and coordination of care.     *Please note that this is a verbal dictation therefore any spelling or grammatical errors are due to the Dragon [employer] One system interpretation.  Tobey Jama Barnacle, DNP, [facility] Palliative Medicine Team Team Phone # [phone]  Pager [phone]

## 2024-08-05 NOTE — Assessment & Plan Note (Signed)
 Hold losartan , Dyazide  given AKI

## 2024-08-05 NOTE — Assessment & Plan Note (Deleted)
 Hold losartan , Dyazide  given AKI

## 2024-08-05 NOTE — Assessment & Plan Note (Deleted)
 Baseline creatinine about 1 Creatinine was 2.7 on presentation, improving and currently 2.13 Incidental finding of AKI at annual physical  Patient has had poor po intake in setting of dementia, also on ARB Improving with IVF Dietician consulted for nutrition evaluation and education

## 2024-08-05 NOTE — Plan of Care (Signed)
  Problem: Education: Goal: Knowledge of General Education information will improve Description: Including pain rating scale, medication(s)/side effects and non-pharmacologic comfort measures 08/05/2024 1513 by Reesa Burnard HERO, RN Outcome: Adequate for Discharge 08/05/2024 1513 by Reesa Burnard HERO, RN Outcome: Progressing   Problem: Health Behavior/Discharge Planning: Goal: Ability to manage health-related needs will improve 08/05/2024 1513 by Reesa Burnard HERO, RN Outcome: Adequate for Discharge 08/05/2024 1513 by Reesa Burnard HERO, RN Outcome: Progressing   Problem: Clinical Measurements: Goal: Ability to maintain clinical measurements within normal limits will improve 08/05/2024 1513 by Reesa Burnard HERO, RN Outcome: Adequate for Discharge 08/05/2024 1513 by Reesa Burnard HERO, RN Outcome: Progressing Goal: Will remain free from infection 08/05/2024 1513 by Reesa Burnard HERO, RN Outcome: Adequate for Discharge 08/05/2024 1513 by Reesa Burnard HERO, RN Outcome: Progressing Goal: Diagnostic test results will improve 08/05/2024 1513 by Reesa Burnard HERO, RN Outcome: Adequate for Discharge 08/05/2024 1513 by Reesa Burnard HERO, RN Outcome: Progressing Goal: Respiratory complications will improve 08/05/2024 1513 by Reesa Burnard HERO, RN Outcome: Adequate for Discharge 08/05/2024 1513 by Reesa Burnard HERO, RN Outcome: Progressing Goal: Cardiovascular complication will be avoided 08/05/2024 1513 by Reesa Burnard HERO, RN Outcome: Adequate for Discharge 08/05/2024 1513 by Reesa Burnard HERO, RN Outcome: Progressing   Problem: Activity: Goal: Risk for activity intolerance will decrease 08/05/2024 1513 by Reesa Burnard HERO, RN Outcome: Adequate for Discharge 08/05/2024 1513 by Reesa Burnard HERO, RN Outcome: Progressing   Problem: Nutrition: Goal: Adequate nutrition will be maintained 08/05/2024  1513 by Reesa Burnard HERO, RN Outcome: Adequate for Discharge 08/05/2024 1513 by Reesa Burnard HERO, RN Outcome: Progressing   Problem: Coping: Goal: Level of anxiety will decrease 08/05/2024 1513 by Reesa Burnard HERO, RN Outcome: Adequate for Discharge 08/05/2024 1513 by Reesa Burnard HERO, RN Outcome: Progressing   Problem: Elimination: Goal: Will not experience complications related to bowel motility 08/05/2024 1513 by Reesa Burnard HERO, RN Outcome: Adequate for Discharge 08/05/2024 1513 by Reesa Burnard HERO, RN Outcome: Progressing Goal: Will not experience complications related to urinary retention 08/05/2024 1513 by Reesa Burnard HERO, RN Outcome: Adequate for Discharge 08/05/2024 1513 by Reesa Burnard HERO, RN Outcome: Progressing   Problem: Pain Managment: Goal: General experience of comfort will improve and/or be controlled 08/05/2024 1513 by Reesa Burnard HERO, RN Outcome: Adequate for Discharge 08/05/2024 1513 by Reesa Burnard HERO, RN Outcome: Progressing   Problem: Safety: Goal: Ability to remain free from injury will improve 08/05/2024 1513 by Reesa Burnard HERO, RN Outcome: Adequate for Discharge 08/05/2024 1513 by Reesa Burnard HERO, RN Outcome: Progressing   Problem: Skin Integrity: Goal: Risk for impaired skin integrity will decrease 08/05/2024 1513 by Reesa Burnard HERO, RN Outcome: Adequate for Discharge 08/05/2024 1513 by Reesa Burnard HERO, RN Outcome: Progressing

## 2024-08-05 NOTE — Assessment & Plan Note (Signed)
 Presenting Hgb 10 Dropped to 7.6 today No overt bleeding other than overnight when she pulled out her IV and dripped along the floor Asymptomatic Will dc today with plan for repeat labs next week

## 2024-08-05 NOTE — Assessment & Plan Note (Addendum)
 Baseline creatinine about 1 Creatinine was 2.7 on presentation, improving and currently 1.87 Incidental finding of AKI at annual physical  Patient has had poor po intake in setting of dementia, also on ARB Improved with IVF; now off IVF and continuing to improve Dietician consulted for nutrition evaluation and education Continue to hold Losartan  and Dyazide  until PCP f/u

## 2024-08-05 NOTE — Assessment & Plan Note (Signed)
 -  Continue Lipitor

## 2024-08-05 NOTE — Assessment & Plan Note (Signed)
 Nutrition Problem: Inadequate oral intake (fluid) Etiology: chronic illness (severe dementia) Signs/Symptoms: per patient/family report, other (comment) (admit for AKI) Interventions: Refer to RD note for recommendations, Ensure Enlive (each supplement provides 350kcal and 20 grams of protein), Education

## 2024-08-05 NOTE — Progress Notes (Signed)
 Nurse Tech Brooklin, during rounding, saw blood droplets on the floor in the hall and subsequently found that patient in 1530 was not in her room or bathroom. Brooklin notified all staff that patient was missing from room and proceeded to search for patient. Other nursing staff joined, and patient was found by Brooklin in another patient's room. Patient was in the bathroom sitting on the toilet urinating. Patient had blood on arms and gown from IV access that was lost when she left her room. She was taken by wheelchair to her room. Patient was cleaned of blood and dressed in clean gown. Bed alarm is on, bed low and locked, call light within reach, patient belongings in reach.

## 2024-08-05 NOTE — Assessment & Plan Note (Deleted)
 Discussed with her brother, who is her caregiver He reports that patient is full code but then on further discussion would have to discuss with family about whether she would be ok with life support Palliative care consulted Given her progressive cognitive impairment in conjunction with poor PO intake, she may be approaching terminal dementia

## 2024-08-06 ENCOUNTER — Telehealth: Payer: Self-pay

## 2024-08-06 NOTE — Transitions of Care (Post Inpatient/ED Visit) (Unsigned)
   08/06/2024  Name: Debbie Brandt MRN: [id number] DOB: 09/29/1960  Today's TOC FU Call Status: Today's TOC FU Call Status:: Unsuccessful Call (1st Attempt) Unsuccessful Call (1st Attempt) Date: 08/06/24  Attempted to reach the patient regarding the most recent Inpatient/ED visit.  Follow Up Plan: Additional outreach attempts will be made to reach the patient to complete the Transitions of Care (Post Inpatient/ED visit) call.   Signature Julian Lemmings, LPN [facility] Nurse Health Advisor Direct Dial [phone]

## 2024-08-06 NOTE — Telephone Encounter (Signed)
 Copied from CRM [id number]. Topic: General - Other >> Aug 06, 2024 10:08 AM Franky GRADE wrote: Reason for CRM: [place], patient's brother is returning a call he received from  Lanette, attempted to reach out but call did not go through.

## 2024-08-06 NOTE — Telephone Encounter (Signed)
 This was TOC call, not our office

## 2024-08-09 NOTE — Transitions of Care (Post Inpatient/ED Visit) (Unsigned)
   08/09/2024  Name: Debbie Brandt MRN: [id number] DOB: [birth year]  Today's TOC FU Call Status: Today's TOC FU Call Status:: Unsuccessful Call (2nd Attempt) Unsuccessful Call (1st Attempt) Date: 08/06/24 Unsuccessful Call (2nd Attempt) Date: 08/09/24  Attempted to reach the patient regarding the most recent Inpatient/ED visit.  Follow Up Plan: Additional outreach attempts will be made to reach the patient to complete the Transitions of Care (Post Inpatient/ED visit) call.   Signature Julian Lemmings, LPN [facility] Nurse Health Advisor Direct Dial [phone]

## 2024-08-10 NOTE — Transitions of Care (Post Inpatient/ED Visit) (Signed)
   08/10/2024  Name: Debbie Brandt MRN: [id number] DOB: Apr 02, 1961  Today's TOC FU Call Status: Today's TOC FU Call Status:: Unsuccessful Call (3rd Attempt) Unsuccessful Call (1st Attempt) Date: 08/06/24 Unsuccessful Call (2nd Attempt) Date: 08/09/24 Unsuccessful Call (3rd Attempt) Date: 08/10/24  Attempted to reach the patient regarding the most recent Inpatient/ED visit.  Follow Up Plan: Additional outreach attempts will be made to reach the patient to complete the Transitions of Care (Post Inpatient/ED visit) call.   Signature Julian Lemmings, LPN [facility] Nurse Health Advisor Direct Dial [phone]

## 2024-08-11 ENCOUNTER — Inpatient Hospital Stay: Admitting: Family Medicine

## 2024-08-15 ENCOUNTER — Inpatient Hospital Stay (HOSPITAL_COMMUNITY)
Admission: EM | Admit: 2024-08-15 | Discharge: 2024-08-22 | DRG: 493 | Disposition: A | Discharge status: Skilled Nursing Facility | Attending: Internal Medicine | Admitting: Internal Medicine

## 2024-08-15 ENCOUNTER — Emergency Department (HOSPITAL_COMMUNITY)

## 2024-08-15 ENCOUNTER — Inpatient Hospital Stay (HOSPITAL_COMMUNITY)

## 2024-08-15 ENCOUNTER — Other Ambulatory Visit: Payer: Self-pay

## 2024-08-15 ENCOUNTER — Encounter (HOSPITAL_COMMUNITY): Payer: Self-pay

## 2024-08-15 DIAGNOSIS — E785 Hyperlipidemia, unspecified: Secondary | ICD-10-CM | POA: Diagnosis not present

## 2024-08-15 DIAGNOSIS — S82892D Other fracture of left lower leg, subsequent encounter for closed fracture with routine healing: Secondary | ICD-10-CM | POA: Diagnosis not present

## 2024-08-15 DIAGNOSIS — G309 Alzheimer's disease, unspecified: Secondary | ICD-10-CM | POA: Diagnosis not present

## 2024-08-15 DIAGNOSIS — S82832A Other fracture of upper and lower end of left fibula, initial encounter for closed fracture: Secondary | ICD-10-CM | POA: Diagnosis not present

## 2024-08-15 DIAGNOSIS — E782 Mixed hyperlipidemia: Secondary | ICD-10-CM

## 2024-08-15 DIAGNOSIS — N179 Acute kidney failure, unspecified: Secondary | ICD-10-CM | POA: Diagnosis not present

## 2024-08-15 DIAGNOSIS — D638 Anemia in other chronic diseases classified elsewhere: Secondary | ICD-10-CM | POA: Diagnosis not present

## 2024-08-15 DIAGNOSIS — S82892A Other fracture of left lower leg, initial encounter for closed fracture: Secondary | ICD-10-CM | POA: Diagnosis not present

## 2024-08-15 DIAGNOSIS — I10 Essential (primary) hypertension: Secondary | ICD-10-CM | POA: Diagnosis not present

## 2024-08-15 DIAGNOSIS — S82402A Unspecified fracture of shaft of left fibula, initial encounter for closed fracture: Secondary | ICD-10-CM | POA: Diagnosis not present

## 2024-08-15 DIAGNOSIS — S92152A Displaced avulsion fracture (chip fracture) of left talus, initial encounter for closed fracture: Secondary | ICD-10-CM | POA: Diagnosis not present

## 2024-08-15 DIAGNOSIS — G4700 Insomnia, unspecified: Secondary | ICD-10-CM

## 2024-08-15 DIAGNOSIS — S82892P Other fracture of left lower leg, subsequent encounter for closed fracture with malunion: Secondary | ICD-10-CM | POA: Diagnosis not present

## 2024-08-15 DIAGNOSIS — F02C0 Dementia in other diseases classified elsewhere, severe, without behavioral disturbance, psychotic disturbance, mood disturbance, and anxiety: Secondary | ICD-10-CM

## 2024-08-15 DIAGNOSIS — D62 Acute posthemorrhagic anemia: Secondary | ICD-10-CM | POA: Diagnosis not present

## 2024-08-15 DIAGNOSIS — F418 Other specified anxiety disorders: Secondary | ICD-10-CM | POA: Diagnosis not present

## 2024-08-15 DIAGNOSIS — S82432A Displaced oblique fracture of shaft of left fibula, initial encounter for closed fracture: Secondary | ICD-10-CM | POA: Diagnosis not present

## 2024-08-15 DIAGNOSIS — S8252XA Displaced fracture of medial malleolus of left tibia, initial encounter for closed fracture: Secondary | ICD-10-CM | POA: Diagnosis not present

## 2024-08-15 DIAGNOSIS — N281 Cyst of kidney, acquired: Secondary | ICD-10-CM | POA: Diagnosis not present

## 2024-08-15 DIAGNOSIS — M79673 Pain in unspecified foot: Secondary | ICD-10-CM | POA: Diagnosis not present

## 2024-08-15 DIAGNOSIS — E039 Hypothyroidism, unspecified: Secondary | ICD-10-CM

## 2024-08-15 LAB — COMPREHENSIVE METABOLIC PANEL WITH GFR
ALT: 28 U/L (ref 0–44)
AST: 37 U/L (ref 15–41)
Albumin: 3.8 g/dL (ref 3.5–5.0)
Alkaline Phosphatase: 76 U/L (ref 38–126)
Anion gap: 14 (ref 5–15)
BUN: 35 mg/dL — ABNORMAL HIGH (ref 8–23)
CO2: 24 mmol/L (ref 22–32)
Calcium: 9.4 mg/dL (ref 8.9–10.3)
Chloride: 102 mmol/L (ref 98–111)
Creatinine, Ser: 3.18 mg/dL — ABNORMAL HIGH (ref 0.44–1.00)
GFR, Estimated: 16 mL/min — ABNORMAL LOW (ref >60)
Glucose, Bld: 100 mg/dL — ABNORMAL HIGH (ref 70–99)
Potassium: 4.5 mmol/L (ref 3.5–5.1)
Sodium: 140 mmol/L (ref 135–145)
Total Bilirubin: 0.6 mg/dL (ref 0.0–1.2)
Total Protein: 7.6 g/dL (ref 6.5–8.1)

## 2024-08-15 LAB — CBC WITH DIFFERENTIAL/PLATELET
Abs Immature Granulocytes: 0.06 K/uL (ref 0.00–0.07)
Basophils Absolute: 0 K/uL (ref 0.0–0.1)
Basophils Relative: 0 %
Eosinophils Absolute: 0.1 K/uL (ref 0.0–0.5)
Eosinophils Relative: 1 %
HCT: 30.2 % — ABNORMAL LOW (ref 36.0–46.0)
Hemoglobin: 9.6 g/dL — ABNORMAL LOW (ref 12.0–15.0)
Immature Granulocytes: 1 %
Lymphocytes Relative: 19 %
Lymphs Abs: 2 K/uL (ref 0.7–4.0)
MCH: 28.4 pg (ref 26.0–34.0)
MCHC: 31.8 g/dL (ref 30.0–36.0)
MCV: 89.3 fL (ref 80.0–100.0)
Monocytes Absolute: 0.8 K/uL (ref 0.1–1.0)
Monocytes Relative: 8 %
Neutro Abs: 7.4 K/uL (ref 1.7–7.7)
Neutrophils Relative %: 71 %
Platelets: 428 K/uL — ABNORMAL HIGH (ref 150–400)
RBC: 3.38 MIL/uL — ABNORMAL LOW (ref 3.87–5.11)
RDW: 14 % (ref 11.5–15.5)
WBC: 10.4 K/uL (ref 4.0–10.5)
nRBC: 0 % (ref 0.0–0.2)

## 2024-08-15 LAB — MAGNESIUM: Magnesium: 1.9 mg/dL (ref 1.7–2.4)

## 2024-08-15 MED ORDER — ATORVASTATIN CALCIUM 10 MG PO TABS
20.0000 mg | ORAL_TABLET | Freq: Every day | ORAL | Status: DC
  Administered 2024-08-16 – 2024-08-22 (×7): 20 mg via ORAL
  Filled 2024-08-15 (×7): qty 2

## 2024-08-15 MED ORDER — LACTATED RINGERS IV BOLUS
1000.0000 mL | Freq: Once | INTRAVENOUS | Status: AC
  Administered 2024-08-15: 1000 mL via INTRAVENOUS

## 2024-08-15 MED ORDER — ACETAMINOPHEN 500 MG PO TABS
1000.0000 mg | ORAL_TABLET | Freq: Three times a day (TID) | ORAL | Status: DC
  Administered 2024-08-15 – 2024-08-22 (×17): 1000 mg via ORAL
  Filled 2024-08-15 (×17): qty 2

## 2024-08-15 MED ORDER — LEVOTHYROXINE SODIUM 75 MCG PO TABS
75.0000 ug | ORAL_TABLET | Freq: Every day | ORAL | Status: DC
  Administered 2024-08-16 – 2024-08-22 (×6): 75 ug via ORAL
  Filled 2024-08-15 (×6): qty 1

## 2024-08-15 MED ORDER — DONEPEZIL HCL 5 MG PO TABS
10.0000 mg | ORAL_TABLET | Freq: Every day | ORAL | Status: DC
  Administered 2024-08-15 – 2024-08-21 (×7): 10 mg via ORAL
  Filled 2024-08-15 (×3): qty 1
  Filled 2024-08-15: qty 2
  Filled 2024-08-15 (×3): qty 1

## 2024-08-15 MED ORDER — ACETAMINOPHEN 325 MG PO TABS: 650.0000 mg | ORAL_TABLET | Freq: Four times a day (QID) | ORAL | Status: DC | PRN

## 2024-08-15 MED ORDER — HYDROMORPHONE HCL 1 MG/ML IJ SOLN
0.5000 mg | Freq: Four times a day (QID) | INTRAMUSCULAR | Status: DC | PRN
  Administered 2024-08-15: 0.5 mg via INTRAVENOUS
  Filled 2024-08-15: qty 1

## 2024-08-15 MED ORDER — SENNOSIDES-DOCUSATE SODIUM 8.6-50 MG PO TABS: 1.0000 | ORAL_TABLET | Freq: Every evening | ORAL | Status: DC | PRN

## 2024-08-15 MED ORDER — ONDANSETRON HCL 4 MG/2ML IJ SOLN: 4.0000 mg | Freq: Four times a day (QID) | INTRAMUSCULAR | Status: DC | PRN

## 2024-08-15 MED ORDER — VITAMIN B-12 1000 MCG PO TABS
1000.0000 ug | ORAL_TABLET | Freq: Every day | ORAL | Status: DC
  Administered 2024-08-16 – 2024-08-22 (×7): 1000 ug via ORAL
  Filled 2024-08-15 (×7): qty 1

## 2024-08-15 MED ORDER — BISACODYL 5 MG PO TBEC: 5.0000 mg | DELAYED_RELEASE_TABLET | Freq: Every day | ORAL | Status: DC | PRN

## 2024-08-15 MED ORDER — FENTANYL CITRATE (PF) 50 MCG/ML IJ SOSY
50.0000 ug | PREFILLED_SYRINGE | Freq: Once | INTRAMUSCULAR | Status: AC
  Administered 2024-08-15: 50 ug via INTRAVENOUS
  Filled 2024-08-15: qty 1

## 2024-08-15 MED ORDER — ACETAMINOPHEN 650 MG RE SUPP: 650.0000 mg | Freq: Four times a day (QID) | RECTAL | Status: DC | PRN

## 2024-08-15 MED ORDER — MEMANTINE HCL 5 MG PO TABS
10.0000 mg | ORAL_TABLET | Freq: Two times a day (BID) | ORAL | Status: DC
  Administered 2024-08-15 – 2024-08-22 (×14): 10 mg via ORAL
  Filled 2024-08-15 (×6): qty 1
  Filled 2024-08-15: qty 2
  Filled 2024-08-15 (×4): qty 1
  Filled 2024-08-15: qty 2
  Filled 2024-08-15 (×2): qty 1

## 2024-08-15 MED ORDER — MIRTAZAPINE 15 MG PO TBDP
15.0000 mg | ORAL_TABLET | Freq: Every day | ORAL | Status: DC
  Administered 2024-08-15 – 2024-08-21 (×7): 15 mg via ORAL
  Filled 2024-08-15 (×7): qty 1

## 2024-08-15 MED ORDER — LACTATED RINGERS IV SOLN: INTRAVENOUS | Status: AC

## 2024-08-15 MED ORDER — ONDANSETRON HCL 4 MG PO TABS: 4.0000 mg | ORAL_TABLET | Freq: Four times a day (QID) | ORAL | Status: DC | PRN

## 2024-08-15 MED ORDER — PROPOFOL 10 MG/ML IV BOLUS
0.5000 mg/kg | Freq: Once | INTRAVENOUS | Status: AC
  Administered 2024-08-15: 35.4 mg via INTRAVENOUS
  Filled 2024-08-15: qty 20

## 2024-08-15 MED ORDER — ENSURE PLUS HIGH PROTEIN PO LIQD
237.0000 mL | Freq: Two times a day (BID) | ORAL | Status: DC
  Administered 2024-08-17 – 2024-08-21 (×8): 237 mL via ORAL

## 2024-08-15 MED ORDER — HYDROMORPHONE HCL 1 MG/ML IJ SOLN: 0.5000 mg | Freq: Four times a day (QID) | INTRAMUSCULAR | Status: DC | PRN

## 2024-08-15 MED ORDER — OXYCODONE HCL 5 MG PO TABS
5.0000 mg | ORAL_TABLET | Freq: Four times a day (QID) | ORAL | Status: DC | PRN
  Administered 2024-08-16 – 2024-08-18 (×3): 5 mg via ORAL
  Filled 2024-08-15 (×3): qty 1

## 2024-08-15 NOTE — H&P (Signed)
 History and Physical  Debbie Brandt FMW:[id number] DOB: [birth year] DOA: 08/15/2024  PCP: Lendia Boby LITTIE, NP-C   Chief Complaint: Left foot pain  HPI: Debbie Brandt is a 63 y.o. female with medical history significant for dementia, HTN, HLD, hypothyroidism, SBO, GERD and depression who presented to the ED for evaluation of left foot pain.  Patient is completely dependent on her brother for all her ADLs due to her dementia.  Currently resides with brother who is her POA.  Per brother, patient started reporting some pain with pressure on her left foot 2 days ago.  Patient sleeps in her own bed and ambulates without an assistive device. They have not seen patient fall or trip in the house.  He is unsure when patient sustained the left foot injury.  He also reports that patient has been eating and drinking okay.  At the bedside, patient was/shivering, occasionally mumbles but difficult to make out what she is saying.  ED Course: Initial vitals show patient afebrile, HR 60-70s, BP 107/51, SpO2 100% on room air. Initial labs significant for creatinine 3.18, WBC 10.4, Hgb 9.6, platelet 428, mag 1.9. EKG shows sinus rhythm.  X-ray tibia/fibula left shows acute fracture through the distal fibular and medial subluxation of the distal tibia in relation to the talus with soft tissue swelling of the lower extremity and foot.  Pt received IV LR 1 L bolus x 2, and IV fentanyl .  Patient was given propofol  and her left ankle was reduced. Orthopedic surgery was consulted for evaluation. TR[facility] was consulted for admission.   Review of Systems: Please see HPI for pertinent positives and negatives. A complete 10 system review of systems are otherwise negative.  Past Medical History:  Diagnosis Date   Chest pain 09/26/2015   Depression    Essential hypertension 09/26/2015   GERD (gastroesophageal reflux disease)    Hypertension    Hypothyroidism 09/26/2015   Murmur 09/26/2015   Thyroid disease    Past Surgical  History:  Procedure Laterality Date   ABDOMINAL HYSTERECTOMY     CESAREAN SECTION     LAPAROSCOPIC ABDOMINAL EXPLORATION N/A 11/21/2017   Procedure: LAPAROSCOPIC ABDOMINAL EXPLORATION LYSIS OF ADHESIONS;  Surgeon: Aron Shoulders, MD;  Location: [facility];  Service: General;  Laterality: N/A;   LAPAROTOMY N/A 10/28/2016   Procedure: EXPLORATORY LAPAROTOMY WITH  LYSIS OF ADHESIONS;  Surgeon: Krystal Russell, MD;  Location: [facility];  Service: General;  Laterality: N/A;   Social History:  reports that she has never smoked. She has never used smokeless tobacco. She reports that she does not drink alcohol and does not use drugs.  Allergies  Allergen Reactions   Voltaren  [Diclofenac  Sodium] Other (See Comments)    Unknown, pt went to pick up at pharmacy and they said she was allergic    Family History  Problem Relation Age of Onset   Cancer Mother        lung cancer   Hypertension Mother    Cancer Father    Heart attack Father    Mental illness Sister    Hypertension Sister    Hyperlipidemia Sister    Dementia Sister    Diabetes Brother    Hypertension Brother      Prior to Admission medications   Medication Sig Start Date End Date Taking? Authorizing Provider  atorvastatin  (LIPITOR) 20 MG tablet TAKE 1 TABLET BY MOUTH DAILY. 04/13/24   Henson, Vickie L, NP-C  cyanocobalamin  (VITAMIN B12) 1000 MCG tablet Take 1,000 mcg by mouth  daily.    [provider]  donepezil  (ARICEPT ) 10 MG tablet Take 1 tablet (10 mg total) by mouth at bedtime. 06/09/24   Lomax, Amy, NP  feeding supplement (ENSURE PLUS HIGH PROTEIN) LIQD Take 237 mLs by mouth 2 (two) times daily between meals. 08/05/24   Barbarann Nest, MD  levothyroxine  (SYNTHROID ) 75 MCG tablet TAKE 1 TABLET BY MOUTH EVERY MORNING BEFORE BREAKFAST 03/23/24   Henson, Vickie L, NP-C  losartan  (COZAAR ) 50 MG tablet Take 1 tablet (50 mg total) by mouth daily. 03/23/24   Henson, Vickie L, NP-C  memantine  (NAMENDA ) 10 MG tablet Take 1 tablet (10 mg  total) by mouth 2 (two) times daily. 06/09/24   Lomax, Amy, NP  mirtazapine  (REMERON  SOL-TAB) 15 MG disintegrating tablet DISSOLVE 1 TABLET ON TONGUE EVERY NIGHT AT BEDTIME 03/23/24   Henson, Vickie L, NP-C  triamterene -hydrochlorothiazide  (DYAZIDE ) 37.5-25 MG capsule TAKE 1 CAPSULE BY MOUTH DAILY 06/21/24   Lendia Boby CROME, NP-C    Physical Exam: BP (!) 127/58   Pulse 65   Temp 97.8 F (36.6 C)   Resp 18   Ht 5' 4 (1.626 m)   Wt 70.8 kg   SpO2 100%   BMI 26.78 kg/m  General: Chronically ill elderly woman laying in bed. No acute distress. HEENT: [place]/AT. Anicteric sclera CV: RRR. No murmurs, rubs, or gallops. No LE edema Pulmonary: Lungs CTAB. Normal effort. No wheezing or rales. Abdominal: Soft, nontender, nondistended. Normal bowel sounds. Extremities: LLE reduced and with a short leg splint. Foot is warm to touch. Skin: Warm and dry. No obvious rash or lesions. Neuro: Alert but not oriented (at baseline).  Nonverbal at baseline but intermittently mumbles.  Moves all extremities. Normal sensation to light touch. No focal deficit. Psych: Normal mood and affect          Labs on Admission:  Basic Metabolic Panel: Recent Labs  Lab 08/15/24 1622  NA 140  K 4.5  CL 102  CO2 24  GLUCOSE 100*  BUN 35*  CREATININE 3.18*  CALCIUM  9.4  MG 1.9   Liver Function Tests: Recent Labs  Lab 08/15/24 1622  AST 37  ALT 28  ALKPHOS 76  BILITOT 0.6  PROT 7.6  ALBUMIN  3.8   No results for input(s): LIPASE, AMYLASE in the last 168 hours. No results for input(s): AMMONIA in the last 168 hours. CBC: Recent Labs  Lab 08/15/24 1622  WBC 10.4  NEUTROABS 7.4  HGB 9.6*  HCT 30.2*  MCV 89.3  PLT 428*   Cardiac Enzymes: No results for input(s): CKTOTAL, CKMB, CKMBINDEX, TROPONINI in the last 168 hours. BNP (last 3 results) No results for input(s): BNP in the last 8760 hours.  ProBNP (last 3 results) Recent Labs    03/15/24 1620  PROBNP 266.0    CBG: No  results for input(s): GLUCAP in the last 168 hours.  Radiological Exams on Admission: DG Ankle 2 Views Left Result Date: 08/15/2024 CLINICAL DATA:  Post closed reduction of left ankle fractures. EXAM: LEFT ANKLE - 2 VIEW COMPARISON:  08/15/2024 at 4:12 p.m. FINDINGS: Lateral talar subluxation has been mostly reduced. There is significant improved alignment of the distal fibular fracture now with 2-3 mm of residual lateral displacement. Ankle is supported in a fiberglass cast. IMPRESSION: Significant improved fracture and ankle joint alignment following closed reduction. Electronically Signed   By: Alm Parkins M.D.   On: 08/15/2024 18:38   DG Tibia/Fibula Left Result Date: 08/15/2024 CLINICAL DATA:  Pain. EXAM: LEFT  FOOT - 2 VIEW; LEFT KNEE - COMPLETE 4+ VIEW; LEFT TIBIA AND FIBULA - 2 VIEW; LEFT ANKLE - 2 VIEW COMPARISON:  Left knee x-ray 01/15/2022 FINDINGS: Left knee: There is no evidence of fracture or dislocation. Joint spaces are well maintained. There is mild tricompartmental osteophyte formation. Soft tissues are unremarkable. Left ankle: There is an acute oblique fracture through the distal fibula at the level of the ankle mortise. The distal fracture fragment is distracted 5 mm laterally. There is abnormal widening of the medial talotibial joint space with subluxation of the distal tibia medially 5 mm. Small avulsion fracture fragments are seen between the tip of the medial malleolus in the adjacent talus. There is soft tissue swelling surrounding the ankle. Left foot: There is soft tissue swelling of the dorsal foot. There is no additional fracture or dislocation. There is mild hallux valgus. Rounded sclerotic density in the calcaneus measures 6 mm. Left tibia and fibula: There is soft tissue swelling of the lower extremity. There is no foreign body. There is no acute fracture or dislocation. No focal osseous lesion identified. Joint spaces are maintained. IMPRESSION: 1. Acute fracture through  the distal fibula. 2. Medial subluxation of the distal tibia in relation to the talus. 3. Small avulsion fracture fragments between the tip of the medial malleolus and the adjacent talus. 4. Soft tissue swelling of the lower extremity and foot. 5. No additional acute fracture or dislocation of the left knee, left foot, or left tibia and fibula. Electronically Signed   By: Greig Pique M.D.   On: 08/15/2024 17:01   DG Knee Complete 4 Views Left Result Date: 08/15/2024 CLINICAL DATA:  Pain. EXAM: LEFT FOOT - 2 VIEW; LEFT KNEE - COMPLETE 4+ VIEW; LEFT TIBIA AND FIBULA - 2 VIEW; LEFT ANKLE - 2 VIEW COMPARISON:  Left knee x-ray 01/15/2022 FINDINGS: Left knee: There is no evidence of fracture or dislocation. Joint spaces are well maintained. There is mild tricompartmental osteophyte formation. Soft tissues are unremarkable. Left ankle: There is an acute oblique fracture through the distal fibula at the level of the ankle mortise. The distal fracture fragment is distracted 5 mm laterally. There is abnormal widening of the medial talotibial joint space with subluxation of the distal tibia medially 5 mm. Small avulsion fracture fragments are seen between the tip of the medial malleolus in the adjacent talus. There is soft tissue swelling surrounding the ankle. Left foot: There is soft tissue swelling of the dorsal foot. There is no additional fracture or dislocation. There is mild hallux valgus. Rounded sclerotic density in the calcaneus measures 6 mm. Left tibia and fibula: There is soft tissue swelling of the lower extremity. There is no foreign body. There is no acute fracture or dislocation. No focal osseous lesion identified. Joint spaces are maintained. IMPRESSION: 1. Acute fracture through the distal fibula. 2. Medial subluxation of the distal tibia in relation to the talus. 3. Small avulsion fracture fragments between the tip of the medial malleolus and the adjacent talus. 4. Soft tissue swelling of the lower  extremity and foot. 5. No additional acute fracture or dislocation of the left knee, left foot, or left tibia and fibula. Electronically Signed   By: Greig Pique M.D.   On: 08/15/2024 17:01   DG Foot 2 Views Left Result Date: 08/15/2024 CLINICAL DATA:  Pain. EXAM: LEFT FOOT - 2 VIEW; LEFT KNEE - COMPLETE 4+ VIEW; LEFT TIBIA AND FIBULA - 2 VIEW; LEFT ANKLE - 2 VIEW COMPARISON:  Left  knee x-ray 01/15/2022 FINDINGS: Left knee: There is no evidence of fracture or dislocation. Joint spaces are well maintained. There is mild tricompartmental osteophyte formation. Soft tissues are unremarkable. Left ankle: There is an acute oblique fracture through the distal fibula at the level of the ankle mortise. The distal fracture fragment is distracted 5 mm laterally. There is abnormal widening of the medial talotibial joint space with subluxation of the distal tibia medially 5 mm. Small avulsion fracture fragments are seen between the tip of the medial malleolus in the adjacent talus. There is soft tissue swelling surrounding the ankle. Left foot: There is soft tissue swelling of the dorsal foot. There is no additional fracture or dislocation. There is mild hallux valgus. Rounded sclerotic density in the calcaneus measures 6 mm. Left tibia and fibula: There is soft tissue swelling of the lower extremity. There is no foreign body. There is no acute fracture or dislocation. No focal osseous lesion identified. Joint spaces are maintained. IMPRESSION: 1. Acute fracture through the distal fibula. 2. Medial subluxation of the distal tibia in relation to the talus. 3. Small avulsion fracture fragments between the tip of the medial malleolus and the adjacent talus. 4. Soft tissue swelling of the lower extremity and foot. 5. No additional acute fracture or dislocation of the left knee, left foot, or left tibia and fibula. Electronically Signed   By: Greig Pique M.D.   On: 08/15/2024 17:01   DG Ankle 2 Views Left Result Date:  08/15/2024 CLINICAL DATA:  Pain. EXAM: LEFT FOOT - 2 VIEW; LEFT KNEE - COMPLETE 4+ VIEW; LEFT TIBIA AND FIBULA - 2 VIEW; LEFT ANKLE - 2 VIEW COMPARISON:  Left knee x-ray 01/15/2022 FINDINGS: Left knee: There is no evidence of fracture or dislocation. Joint spaces are well maintained. There is mild tricompartmental osteophyte formation. Soft tissues are unremarkable. Left ankle: There is an acute oblique fracture through the distal fibula at the level of the ankle mortise. The distal fracture fragment is distracted 5 mm laterally. There is abnormal widening of the medial talotibial joint space with subluxation of the distal tibia medially 5 mm. Small avulsion fracture fragments are seen between the tip of the medial malleolus in the adjacent talus. There is soft tissue swelling surrounding the ankle. Left foot: There is soft tissue swelling of the dorsal foot. There is no additional fracture or dislocation. There is mild hallux valgus. Rounded sclerotic density in the calcaneus measures 6 mm. Left tibia and fibula: There is soft tissue swelling of the lower extremity. There is no foreign body. There is no acute fracture or dislocation. No focal osseous lesion identified. Joint spaces are maintained. IMPRESSION: 1. Acute fracture through the distal fibula. 2. Medial subluxation of the distal tibia in relation to the talus. 3. Small avulsion fracture fragments between the tip of the medial malleolus and the adjacent talus. 4. Soft tissue swelling of the lower extremity and foot. 5. No additional acute fracture or dislocation of the left knee, left foot, or left tibia and fibula. Electronically Signed   By: Greig Pique M.D.   On: 08/15/2024 17:01   Assessment/Plan Debbie Brandt is a 63 y.o. female with medical history significant for dementia, HTN, HLD, hypothyroidism, SBO, GERD and depression who presented to the ED for evaluation of left foot pain and admitted for AKI and left leg fracture.  # AKI -  Creatinine elevated to 3.18, from 1.87 at recent hospital discharge, baseline seems to be around 0.9-1.1 - Likely secondary to  poor p.o. intake in the setting of advanced dementia - Start IV LR at 125 cc/hr - Check renal ultrasound - Trend renal function and avoid nephrotoxic meds  # Left foot fracture # Acute distal fibular fracture - Patient with history of dementia presented with left foot pain over the last 2 days - Family did not witness any falls at house, patient likely fell twisted her ankle in her room without family notice - Imaging on admission shows acute fracture through the distal fibular with medial subluxation of the distal tibia in relation to the talus, small avulsion fracture fragments between the tip of the medial malleolus and adjacent talus and soft tissue swelling of the lower extremity and foot. - Left ankle was reduced by EDP with repeat x-ray showing significant improved fracture and ankle joint alignment. - Orthopedic surgery consulted, recommends keeping n.p.o. for surgery if schedule allows - Multimodal pain control with scheduled Tylenol , as needed oxycodone  and IV Dilaudid  - Fall precaution  # Dementia - Advanced, completely dependent with all her ADLs, mostly nonverbal but occasionally mumbles, not oriented at baseline - Continue Aricept  and memantine  - Delirium precautions  # HTN - BP remains soft on admission in the setting of hypovolemia - Continue to hold losartan  and Dyazide  (paused during last hospital discharge) in the setting of AKI  # HLD - Continue atorvastatin   # Hypothyroidism - Continue Synthroid   # Insomnia - Continue mirtazapine   DVT prophylaxis: N/A    Code Status: Full Code  Consults called: Orthopedic surgery  Family Communication: Discussed results/findings and plan for admission with brother at bedside  Severity of Illness: The appropriate patient status for this patient is INPATIENT. Inpatient status is judged to be  reasonable and necessary in order to provide the required intensity of service to ensure the patient's safety. The patient's presenting symptoms, physical exam findings, and initial radiographic and laboratory data in the context of their chronic comorbidities is felt to place them at high risk for further clinical deterioration. Furthermore, it is not anticipated that the patient will be medically stable for discharge from the hospital within 2 midnights of admission.   * I certify that at the point of admission it is my clinical judgment that the patient will require inpatient hospital care spanning beyond 2 midnights from the point of admission due to high intensity of service, high risk for further deterioration and high frequency of surveillance required.*  Level of care: Telemetry    Lou Claretta HERO, MD 08/15/2024, 7:22 PM [facility] Pager: [phone] Isaiah 41:10   If 7PM-7AM, please contact night-coverage [email or web address] Password [email or web address]

## 2024-08-15 NOTE — ED Provider Notes (Signed)
  EMERGENCY DEPARTMENT AT [facility] Provider Note   CSN: [id number] Arrival date & time: 08/15/24  1515     Patient presents with: Foot Pain   Debbie Brandt is a 63 y.o. female.   HPI 63 year old female presents with left ankle and foot swelling.  History is from her brother at the bedside.  Patient has a significant history of Alzheimer's and is unable to contribute any history.  For the past 4 days she has been unable to put any weight on her foot, normally before this she can walk without difficulty.  There has been no obvious fall but the brother states she might of fallen at night and he did not know about it.  She has been unable to bear weight and has had swelling primarily at the ankle with bruising.  She has not had any change in mental status from her baseline.  No vomiting.  Did not eat much today but otherwise has been eating and drinking normally. Last ate around noon.  Prior to Admission medications   Medication Sig Start Date End Date Taking? Authorizing Provider  atorvastatin  (LIPITOR) 20 MG tablet TAKE 1 TABLET BY MOUTH DAILY. 04/13/24   Henson, Vickie L, NP-C  cyanocobalamin  (VITAMIN B12) 1000 MCG tablet Take 1,000 mcg by mouth daily.    [provider]  donepezil  (ARICEPT ) 10 MG tablet Take 1 tablet (10 mg total) by mouth at bedtime. 06/09/24   Lomax, Amy, NP  feeding supplement (ENSURE PLUS HIGH PROTEIN) LIQD Take 237 mLs by mouth 2 (two) times daily between meals. 08/05/24   Barbarann Nest, MD  levothyroxine  (SYNTHROID ) 75 MCG tablet TAKE 1 TABLET BY MOUTH EVERY MORNING BEFORE BREAKFAST 03/23/24   Henson, Vickie L, NP-C  losartan  (COZAAR ) 50 MG tablet Take 1 tablet (50 mg total) by mouth daily. 03/23/24   Henson, Vickie L, NP-C  memantine  (NAMENDA ) 10 MG tablet Take 1 tablet (10 mg total) by mouth 2 (two) times daily. 06/09/24   Lomax, Amy, NP  mirtazapine  (REMERON  SOL-TAB) 15 MG disintegrating tablet DISSOLVE 1 TABLET ON TONGUE EVERY NIGHT  AT BEDTIME 03/23/24   Henson, Vickie L, NP-C  triamterene -hydrochlorothiazide  (DYAZIDE ) 37.5-25 MG capsule TAKE 1 CAPSULE BY MOUTH DAILY 06/21/24   Henson, Vickie L, NP-C    Allergies: Voltaren  [diclofenac  sodium]    Review of Systems  Unable to perform ROS: Dementia    Updated Vital Signs BP 137/67   Pulse 70   Temp 97.8 F (36.6 C)   Resp 17   Ht 5' 4 (1.626 m)   Wt 70.8 kg   SpO2 100%   BMI 26.78 kg/m   Physical Exam Vitals and nursing note reviewed.  Constitutional:      General: She is not in acute distress.    Appearance: She is well-developed. She is not ill-appearing or diaphoretic.  HENT:     Head: Normocephalic and atraumatic.  Cardiovascular:     Rate and Rhythm: Normal rate and regular rhythm.     Pulses:          Dorsalis pedis pulses are 2+ on the right side and 2+ on the left side.  Pulmonary:     Effort: Pulmonary effort is normal.  Abdominal:     General: There is no distension.  Musculoskeletal:     Left knee: No swelling or deformity. Tenderness present.     Left lower leg: Tenderness present.     Right ankle: No swelling. No tenderness. Normal range of  motion.     Left ankle: Swelling present. Tenderness present. Decreased range of motion.     Right foot: No tenderness.     Left foot: Swelling present. No tenderness.  Skin:    General: Skin is warm and dry.  Neurological:     Mental Status: She is alert.     (all labs ordered are listed, but only abnormal results are displayed) Labs Reviewed  COMPREHENSIVE METABOLIC PANEL WITH GFR - Abnormal; Notable for the following components:      Result Value   Glucose, Bld 100 (*)    BUN 35 (*)    Creatinine, Ser 3.18 (*)    GFR, Estimated 16 (*)    All other components within normal limits  CBC WITH DIFFERENTIAL/PLATELET - Abnormal; Notable for the following components:   RBC 3.38 (*)    Hemoglobin 9.6 (*)    HCT 30.2 (*)    Platelets 428 (*)    All other components within normal limits   MAGNESIUM   URINALYSIS, ROUTINE W REFLEX MICROSCOPIC    EKG: EKG Interpretation Date/Time:  Sunday August 15 2024 17:07:04 EST Ventricular Rate:  73 PR Interval:  156 QRS Duration:  118 QT Interval:  406 QTC Calculation: 448 R Axis:   39  Text Interpretation: Sinus rhythm Nonspecific intraventricular conduction delay Low voltage, extremity and precordial leads  similar to Aug 03 2024 Confirmed by Freddi Hamilton [phone]) on 08/15/2024 5:31:29 PM  Radiology: [facility] Ankle 2 Views Left Result Date: 08/15/2024 CLINICAL DATA:  Post closed reduction of left ankle fractures. EXAM: LEFT ANKLE - 2 VIEW COMPARISON:  08/15/2024 at 4:12 p.m. FINDINGS: Lateral talar subluxation has been mostly reduced. There is significant improved alignment of the distal fibular fracture now with 2-3 mm of residual lateral displacement. Ankle is supported in a fiberglass cast. IMPRESSION: Significant improved fracture and ankle joint alignment following closed reduction. Electronically Signed   By: Alm Parkins M.D.   On: 08/15/2024 18:38   DG Tibia/Fibula Left Result Date: 08/15/2024 CLINICAL DATA:  Pain. EXAM: LEFT FOOT - 2 VIEW; LEFT KNEE - COMPLETE 4+ VIEW; LEFT TIBIA AND FIBULA - 2 VIEW; LEFT ANKLE - 2 VIEW COMPARISON:  Left knee x-ray 01/15/2022 FINDINGS: Left knee: There is no evidence of fracture or dislocation. Joint spaces are well maintained. There is mild tricompartmental osteophyte formation. Soft tissues are unremarkable. Left ankle: There is an acute oblique fracture through the distal fibula at the level of the ankle mortise. The distal fracture fragment is distracted 5 mm laterally. There is abnormal widening of the medial talotibial joint space with subluxation of the distal tibia medially 5 mm. Small avulsion fracture fragments are seen between the tip of the medial malleolus in the adjacent talus. There is soft tissue swelling surrounding the ankle. Left foot: There is soft tissue swelling of the dorsal  foot. There is no additional fracture or dislocation. There is mild hallux valgus. Rounded sclerotic density in the calcaneus measures 6 mm. Left tibia and fibula: There is soft tissue swelling of the lower extremity. There is no foreign body. There is no acute fracture or dislocation. No focal osseous lesion identified. Joint spaces are maintained. IMPRESSION: 1. Acute fracture through the distal fibula. 2. Medial subluxation of the distal tibia in relation to the talus. 3. Small avulsion fracture fragments between the tip of the medial malleolus and the adjacent talus. 4. Soft tissue swelling of the lower extremity and foot. 5. No additional acute fracture or dislocation of the left  knee, left foot, or left tibia and fibula. Electronically Signed   By: Greig Pique M.D.   On: 08/15/2024 17:01   DG Knee Complete 4 Views Left Result Date: 08/15/2024 CLINICAL DATA:  Pain. EXAM: LEFT FOOT - 2 VIEW; LEFT KNEE - COMPLETE 4+ VIEW; LEFT TIBIA AND FIBULA - 2 VIEW; LEFT ANKLE - 2 VIEW COMPARISON:  Left knee x-ray 01/15/2022 FINDINGS: Left knee: There is no evidence of fracture or dislocation. Joint spaces are well maintained. There is mild tricompartmental osteophyte formation. Soft tissues are unremarkable. Left ankle: There is an acute oblique fracture through the distal fibula at the level of the ankle mortise. The distal fracture fragment is distracted 5 mm laterally. There is abnormal widening of the medial talotibial joint space with subluxation of the distal tibia medially 5 mm. Small avulsion fracture fragments are seen between the tip of the medial malleolus in the adjacent talus. There is soft tissue swelling surrounding the ankle. Left foot: There is soft tissue swelling of the dorsal foot. There is no additional fracture or dislocation. There is mild hallux valgus. Rounded sclerotic density in the calcaneus measures 6 mm. Left tibia and fibula: There is soft tissue swelling of the lower extremity. There is  no foreign body. There is no acute fracture or dislocation. No focal osseous lesion identified. Joint spaces are maintained. IMPRESSION: 1. Acute fracture through the distal fibula. 2. Medial subluxation of the distal tibia in relation to the talus. 3. Small avulsion fracture fragments between the tip of the medial malleolus and the adjacent talus. 4. Soft tissue swelling of the lower extremity and foot. 5. No additional acute fracture or dislocation of the left knee, left foot, or left tibia and fibula. Electronically Signed   By: Greig Pique M.D.   On: 08/15/2024 17:01   DG Foot 2 Views Left Result Date: 08/15/2024 CLINICAL DATA:  Pain. EXAM: LEFT FOOT - 2 VIEW; LEFT KNEE - COMPLETE 4+ VIEW; LEFT TIBIA AND FIBULA - 2 VIEW; LEFT ANKLE - 2 VIEW COMPARISON:  Left knee x-ray 01/15/2022 FINDINGS: Left knee: There is no evidence of fracture or dislocation. Joint spaces are well maintained. There is mild tricompartmental osteophyte formation. Soft tissues are unremarkable. Left ankle: There is an acute oblique fracture through the distal fibula at the level of the ankle mortise. The distal fracture fragment is distracted 5 mm laterally. There is abnormal widening of the medial talotibial joint space with subluxation of the distal tibia medially 5 mm. Small avulsion fracture fragments are seen between the tip of the medial malleolus in the adjacent talus. There is soft tissue swelling surrounding the ankle. Left foot: There is soft tissue swelling of the dorsal foot. There is no additional fracture or dislocation. There is mild hallux valgus. Rounded sclerotic density in the calcaneus measures 6 mm. Left tibia and fibula: There is soft tissue swelling of the lower extremity. There is no foreign body. There is no acute fracture or dislocation. No focal osseous lesion identified. Joint spaces are maintained. IMPRESSION: 1. Acute fracture through the distal fibula. 2. Medial subluxation of the distal tibia in relation  to the talus. 3. Small avulsion fracture fragments between the tip of the medial malleolus and the adjacent talus. 4. Soft tissue swelling of the lower extremity and foot. 5. No additional acute fracture or dislocation of the left knee, left foot, or left tibia and fibula. Electronically Signed   By: Greig Pique M.D.   On: 08/15/2024 17:01   DG  Ankle 2 Views Left Result Date: 08/15/2024 CLINICAL DATA:  Pain. EXAM: LEFT FOOT - 2 VIEW; LEFT KNEE - COMPLETE 4+ VIEW; LEFT TIBIA AND FIBULA - 2 VIEW; LEFT ANKLE - 2 VIEW COMPARISON:  Left knee x-ray 01/15/2022 FINDINGS: Left knee: There is no evidence of fracture or dislocation. Joint spaces are well maintained. There is mild tricompartmental osteophyte formation. Soft tissues are unremarkable. Left ankle: There is an acute oblique fracture through the distal fibula at the level of the ankle mortise. The distal fracture fragment is distracted 5 mm laterally. There is abnormal widening of the medial talotibial joint space with subluxation of the distal tibia medially 5 mm. Small avulsion fracture fragments are seen between the tip of the medial malleolus in the adjacent talus. There is soft tissue swelling surrounding the ankle. Left foot: There is soft tissue swelling of the dorsal foot. There is no additional fracture or dislocation. There is mild hallux valgus. Rounded sclerotic density in the calcaneus measures 6 mm. Left tibia and fibula: There is soft tissue swelling of the lower extremity. There is no foreign body. There is no acute fracture or dislocation. No focal osseous lesion identified. Joint spaces are maintained. IMPRESSION: 1. Acute fracture through the distal fibula. 2. Medial subluxation of the distal tibia in relation to the talus. 3. Small avulsion fracture fragments between the tip of the medial malleolus and the adjacent talus. 4. Soft tissue swelling of the lower extremity and foot. 5. No additional acute fracture or dislocation of the left  knee, left foot, or left tibia and fibula. Electronically Signed   By: Greig Pique M.D.   On: 08/15/2024 17:01     .Sedation  Date/Time: 08/15/2024 5:51 PM  Performed by: Freddi Hamilton, MD Authorized by: Freddi Hamilton, MD   Consent:    Consent obtained:  Verbal and written   Consent given by:  Healthcare agent Universal protocol:    Immediately prior to procedure, a time out was called: yes     Patient identity confirmed:  Arm band Indications:    Procedure performed:  Fracture reduction   Procedure necessitating sedation performed by:  Physician performing sedation Pre-sedation assessment:    Time since last food or drink:  5.5 hours   NPO status caution: urgency dictates proceeding with non-ideal NPO status     ASA classification: class 3 - patient with severe systemic disease     Mallampati score:  I - soft palate, uvula, fauces, pillars visible   Pre-sedation assessments completed and reviewed: airway patency, cardiovascular function, hydration status, mental status, nausea/vomiting, pain level, respiratory function and temperature   A pre-sedation assessment was completed prior to the start of the procedure Immediate pre-procedure details:    Reassessment: Patient reassessed immediately prior to procedure     Reviewed: vital signs, relevant labs/tests and NPO status     Verified: bag valve mask available, emergency equipment available, intubation equipment available, IV patency confirmed, oxygen available and suction available   Procedure details (see MAR for exact dosages):    Preoxygenation:  Nasal cannula   Sedation:  Propofol    Intended level of sedation: deep   Intra-procedure monitoring:  Blood pressure monitoring, cardiac monitor, continuous pulse oximetry, continuous capnometry, frequent LOC assessments and frequent vital sign checks   Intra-procedure events: none     Total Provider sedation time (minutes):  10 Post-procedure details:   A post-sedation  assessment was completed following the completion of the procedure.   Attendance: Constant attendance by certified staff  until patient recovered     Recovery: Patient returned to pre-procedure baseline     Patient is stable for discharge or admission: yes     Procedure completion:  Tolerated well, no immediate complications .Reduction of fracture  Date/Time: 08/15/2024 5:52 PM  Performed by: Freddi Hamilton, MD Authorized by: Freddi Hamilton, MD  Consent: Verbal consent obtained. Written consent obtained Risks and benefits: risks, benefits and alternatives were discussed Consent given by: power of attorney Required items: required blood products, implants, devices, and special equipment available Patient identity confirmed: arm band Time out: Immediately prior to procedure a time out was called to verify the correct patient, procedure, equipment, support staff and site/side marked as required. Preparation: Patient was prepped and draped in the usual sterile fashion.  Sedation: Patient sedated: yes  Patient tolerance: patient tolerated the procedure well with no immediate complications      Medications Ordered in the ED  lactated ringers  bolus 1,000 mL (0 mLs Intravenous Stopped 08/15/24 1818)  fentaNYL  (SUBLIMAZE ) injection 50 mcg (50 mcg Intravenous Given 08/15/24 1624)  propofol  (DIPRIVAN ) 10 mg/mL bolus/IV push 35.4 mg (35.4 mg Intravenous Given 08/15/24 1743)  lactated ringers  bolus 1,000 mL (1,000 mLs Intravenous New Bag/Given 08/15/24 1818)                                    Medical Decision Making Amount and/or Complexity of Data Reviewed Independent Historian: caregiver External Data Reviewed: notes. Labs: ordered.    Details: Acute on chronic kidney injury Radiology: ordered and independent interpretation performed.    Details: Left ankle fracture with subluxation. Postreduction x-ray with improved alignment ECG/medicine tests: ordered and independent  interpretation performed.    Details: No ischemia  Risk Prescription drug management. Decision regarding hospitalization.   Patient presents with left ankle swelling and inability to walk for several days.  No reported falls at home, but brother states that she stays in her own room and has her own bathroom and may have fallen and he did not see it.  Is found to have a closed displaced ankle fracture.  Discussed with Dr. Germaine, reduced as above, and given her AKI she will need to come into the hospital.  He is recommending keeping her n.p.o. after midnight in case there is OR availability tomorrow.  Discussed with brother that she will need admission and operative repair.  Unclear exactly why she is having the AKI though last time it seemed to be from poor p.o. intake.  Vitals are otherwise unremarkable besides some soft blood pressures that have improved with fluids.  Discussed with Dr. Lou for admission.     Final diagnoses:  Closed fracture of left ankle, initial encounter  Acute kidney injury    ED Discharge Orders     None          Freddi Hamilton, MD 08/15/24 [phone]

## 2024-08-15 NOTE — Consult Note (Incomplete)
 Orthopedic Consultation Note  Current Hospital Day : Hospital Day: 1  Reason For Consult: Left ankle fracture  History of Present Illness:  Debbie Brandt is a 63 y.o. female who presented to the emergency department yesterday due to a left ankle fracture.  She has significant dementia and lives with her brother.  History is taken from her brother.  He is unclear exactly when she may have fell but sometime in the last couple days.  She has not been able to ambulate.  Past Medical History:  Diagnosis Date   Chest pain 09/26/2015   Depression    Essential hypertension 09/26/2015   GERD (gastroesophageal reflux disease)    Hypertension    Hypothyroidism 09/26/2015   Murmur 09/26/2015   Thyroid disease     Past Surgical History:  Procedure Laterality Date   ABDOMINAL HYSTERECTOMY     CESAREAN SECTION     LAPAROSCOPIC ABDOMINAL EXPLORATION N/A 11/21/2017   Procedure: LAPAROSCOPIC ABDOMINAL EXPLORATION LYSIS OF ADHESIONS;  Surgeon: Aron Shoulders, MD;  Location: [facility];  Service: General;  Laterality: N/A;   LAPAROTOMY N/A 10/28/2016   Procedure: EXPLORATORY LAPAROTOMY WITH  LYSIS OF ADHESIONS;  Surgeon: Krystal Russell, MD;  Location: [facility];  Service: General;  Laterality: N/A;    Prior to Admission medications   Medication Sig Start Date End Date Taking? Authorizing Provider  atorvastatin  (LIPITOR) 20 MG tablet TAKE 1 TABLET BY MOUTH DAILY. 04/13/24   Henson, Vickie L, NP-C  cyanocobalamin  (VITAMIN B12) 1000 MCG tablet Take 1,000 mcg by mouth daily.    [provider]  donepezil  (ARICEPT ) 10 MG tablet Take 1 tablet (10 mg total) by mouth at bedtime. 06/09/24   Lomax, Amy, NP  feeding supplement (ENSURE PLUS HIGH PROTEIN) LIQD Take 237 mLs by mouth 2 (two) times daily between meals. 08/05/24   Barbarann Nest, MD  levothyroxine  (SYNTHROID ) 75 MCG tablet TAKE 1 TABLET BY MOUTH EVERY MORNING BEFORE BREAKFAST 03/23/24   Henson, Vickie L, NP-C  losartan  (COZAAR ) 50 MG tablet Take 1  tablet (50 mg total) by mouth daily. 03/23/24   Henson, Vickie L, NP-C  memantine  (NAMENDA ) 10 MG tablet Take 1 tablet (10 mg total) by mouth 2 (two) times daily. 06/09/24   Lomax, Amy, NP  mirtazapine  (REMERON  SOL-TAB) 15 MG disintegrating tablet DISSOLVE 1 TABLET ON TONGUE EVERY NIGHT AT BEDTIME 03/23/24   Henson, Vickie L, NP-C  triamterene -hydrochlorothiazide  (DYAZIDE ) 37.5-25 MG capsule TAKE 1 CAPSULE BY MOUTH DAILY 06/21/24   Lendia Boby LITTIE, NP-C    Physical Examination Left Lower Extremity: Skin intact, splint in place Grossly moving foot Foot wwp   Imaging: X-rays of the left ankle reviewed and interpreted demonstrating bimalleolar equivalent ankle fracture with distal fibula fracture and significant medial clear space widening.  Assessment:   Debbie Brandt is a 63 y.o. female with left ankle bimalleolar equivalent ankle fracture.  I discussed with her brother who is her POA surgical versus nonsurgical management.  She does regularly ambulate and do all of her own activities of daily living including stairs.  We discussed that I would be unsure of her recovery without surgical management due to the unstable nature of her injury demonstrated on her injury films.  We discussed surgical management including open reduction internal fixation and associated risks including nonunion, malunion, infection, neurovascular injury, ankle pain or stiffness and hardware irritation among others.  He was in favor with us  proceeding with surgery  Plan:   Nonweightbearing left upper extremity, elevate Surgery today,  remain n.p.o. DVT ppx: SCDs, postop chemoprophylaxis Appreciate medicine pre-op eval/optimization

## 2024-08-15 NOTE — ED Notes (Signed)
 The patients brother has given consent for the left ankle reduction and the procedural sedation.

## 2024-08-15 NOTE — ED Triage Notes (Addendum)
 PER EMS: pt is from home, here with pain to LEFT foot x a few days but is getting worse.  + sensation, + pedal pulse, + swelling. No known injury. No visible wounds. She has dementia and is non-verbal, but does talk sometimes. She normally walks at home with a walker.   BP- 140/90, HR-96, O2-98  EDIT TO ADD: EMS reported the pain was to her right foot/ankle, but upon palpation, the patient withdraws from pain to left foot/ankle.

## 2024-08-15 NOTE — Progress Notes (Signed)
 Respiratory Therapist at [facility]  in room number WA01_ during procedure.  Suction with [place] at [facility] set up and ready to use.yes Ambu bag at [facility] and ready to use. yes Patient placed on ETCO2 Nasal Cannula at __2__ LPM.    Vitals at conclusion of procedure:  ETCO2 __31_ mmHg HR 79 RR 19 SPO2 100

## 2024-08-15 NOTE — Sedation Documentation (Signed)
 X-ray at bedside.

## 2024-08-15 NOTE — Progress Notes (Signed)
 Orthopedic Tech Progress Note Patient Details:  Debbie Brandt Jun 27, 1961 [id number]  Ortho Devices Type of Ortho Device: Stirrup splint, Short leg splint Ortho Device/Splint Location: LLE Ortho Device/Splint Interventions: Application   Post Interventions Patient Tolerated: Well  [name] 08/15/2024, 6:09 PM

## 2024-08-16 ENCOUNTER — Inpatient Hospital Stay (HOSPITAL_COMMUNITY): Admitting: Anesthesiology

## 2024-08-16 ENCOUNTER — Inpatient Hospital Stay (HOSPITAL_COMMUNITY)

## 2024-08-16 ENCOUNTER — Encounter (HOSPITAL_COMMUNITY)
Admission: EM | Disposition: A | Discharge status: Skilled Nursing Facility | Payer: Self-pay | Source: Home / Self Care | Attending: Internal Medicine

## 2024-08-16 ENCOUNTER — Other Ambulatory Visit: Payer: Self-pay

## 2024-08-16 DIAGNOSIS — I10 Essential (primary) hypertension: Secondary | ICD-10-CM | POA: Diagnosis not present

## 2024-08-16 DIAGNOSIS — E039 Hypothyroidism, unspecified: Secondary | ICD-10-CM | POA: Diagnosis not present

## 2024-08-16 DIAGNOSIS — S82892A Other fracture of left lower leg, initial encounter for closed fracture: Secondary | ICD-10-CM | POA: Diagnosis not present

## 2024-08-16 DIAGNOSIS — F418 Other specified anxiety disorders: Secondary | ICD-10-CM | POA: Diagnosis not present

## 2024-08-16 HISTORY — PX: ORIF ANKLE FRACTURE: SHX5408

## 2024-08-16 LAB — BASIC METABOLIC PANEL WITH GFR
Anion gap: 10 (ref 5–15)
BUN: 33 mg/dL — ABNORMAL HIGH (ref 8–23)
CO2: 23 mmol/L (ref 22–32)
Calcium: 8.7 mg/dL — ABNORMAL LOW (ref 8.9–10.3)
Chloride: 104 mmol/L (ref 98–111)
Creatinine, Ser: 2.68 mg/dL — ABNORMAL HIGH (ref 0.44–1.00)
GFR, Estimated: 19 mL/min — ABNORMAL LOW (ref >60)
Glucose, Bld: 90 mg/dL (ref 70–99)
Potassium: 3.8 mmol/L (ref 3.5–5.1)
Sodium: 138 mmol/L (ref 135–145)

## 2024-08-16 LAB — CBC
HCT: 23.8 % — ABNORMAL LOW (ref 36.0–46.0)
Hemoglobin: 7.3 g/dL — ABNORMAL LOW (ref 12.0–15.0)
MCH: 28.2 pg (ref 26.0–34.0)
MCHC: 30.7 g/dL (ref 30.0–36.0)
MCV: 91.9 fL (ref 80.0–100.0)
Platelets: 344 K/uL (ref 150–400)
RBC: 2.59 MIL/uL — ABNORMAL LOW (ref 3.87–5.11)
RDW: 13.7 % (ref 11.5–15.5)
WBC: 6.7 K/uL (ref 4.0–10.5)
nRBC: 0 % (ref 0.0–0.2)

## 2024-08-16 LAB — ABO/RH: ABO/RH(D): O POS

## 2024-08-16 LAB — GLUCOSE, CAPILLARY: Glucose-Capillary: 100 mg/dL — ABNORMAL HIGH (ref 70–99)

## 2024-08-16 LAB — IRON AND TIBC
Iron: 25 ug/dL — ABNORMAL LOW (ref 28–170)
Saturation Ratios: 15 % (ref 10.4–31.8)
TIBC: 161 ug/dL — ABNORMAL LOW (ref 250–450)
UIBC: 137 ug/dL

## 2024-08-16 LAB — HIV ANTIBODY (ROUTINE TESTING W REFLEX): HIV Screen 4th Generation wRfx: NONREACTIVE

## 2024-08-16 LAB — FERRITIN: Ferritin: 576 ng/mL — ABNORMAL HIGH (ref 11–307)

## 2024-08-16 LAB — SURGICAL PCR SCREEN
MRSA, PCR: NEGATIVE
Staphylococcus aureus: NEGATIVE

## 2024-08-16 SURGERY — OPEN REDUCTION INTERNAL FIXATION (ORIF) ANKLE FRACTURE
Anesthesia: General | Site: Ankle | Laterality: Left

## 2024-08-16 MED ORDER — BISACODYL 5 MG PO TBEC
5.0000 mg | DELAYED_RELEASE_TABLET | Freq: Every day | ORAL | Status: DC
  Administered 2024-08-18 – 2024-08-21 (×4): 5 mg via ORAL
  Filled 2024-08-16 (×4): qty 1

## 2024-08-16 MED ORDER — TRANEXAMIC ACID-NACL 1000-0.7 MG/100ML-% IV SOLN
1000.0000 mg | INTRAVENOUS | Status: AC
  Administered 2024-08-16: 1000 mg via INTRAVENOUS
  Filled 2024-08-16: qty 100

## 2024-08-16 MED ORDER — VANCOMYCIN HCL 1000 MG IV SOLR
INTRAVENOUS | Status: AC
  Filled 2024-08-16: qty 20

## 2024-08-16 MED ORDER — CEFAZOLIN SODIUM-DEXTROSE 2-4 GM/100ML-% IV SOLN
2.0000 g | INTRAVENOUS | Status: AC
  Administered 2024-08-16: 2 g via INTRAVENOUS
  Filled 2024-08-16: qty 100

## 2024-08-16 MED ORDER — DEXAMETHASONE SODIUM PHOSPHATE 4 MG/ML IJ SOLN
INTRAMUSCULAR | Status: DC | PRN
  Administered 2024-08-16: 2 mg via PERINEURAL
  Administered 2024-08-16: 3 mg via PERINEURAL

## 2024-08-16 MED ORDER — 0.9 % SODIUM CHLORIDE (POUR BTL) OPTIME
TOPICAL | Status: DC | PRN
  Administered 2024-08-16: 1000 mL

## 2024-08-16 MED ORDER — ONDANSETRON HCL 4 MG/2ML IJ SOLN
INTRAMUSCULAR | Status: DC | PRN
  Administered 2024-08-16: 4 mg via INTRAVENOUS

## 2024-08-16 MED ORDER — CLONIDINE HCL (ANALGESIA) 100 MCG/ML EP SOLN
EPIDURAL | Status: DC | PRN
  Administered 2024-08-16: 50 ug
  Administered 2024-08-16: 30 ug

## 2024-08-16 MED ORDER — FENTANYL CITRATE (PF) 100 MCG/2ML IJ SOLN
INTRAMUSCULAR | Status: AC
  Filled 2024-08-16: qty 2

## 2024-08-16 MED ORDER — CEFAZOLIN SODIUM-DEXTROSE 2-4 GM/100ML-% IV SOLN
2.0000 g | Freq: Three times a day (TID) | INTRAVENOUS | Status: AC
  Administered 2024-08-16 – 2024-08-17 (×2): 2 g via INTRAVENOUS
  Filled 2024-08-16 (×2): qty 100

## 2024-08-16 MED ORDER — SODIUM CHLORIDE 0.9 % IV SOLN: INTRAVENOUS | Status: DC

## 2024-08-16 MED ORDER — EPHEDRINE SULFATE-NACL 50-0.9 MG/10ML-% IV SOSY
PREFILLED_SYRINGE | INTRAVENOUS | Status: DC | PRN
  Administered 2024-08-16 (×2): 10 mg via INTRAVENOUS

## 2024-08-16 MED ORDER — DEXAMETHASONE SOD PHOSPHATE PF 10 MG/ML IJ SOLN
INTRAMUSCULAR | Status: DC | PRN
  Administered 2024-08-16: 10 mg via INTRAVENOUS

## 2024-08-16 MED ORDER — PHENYLEPHRINE 80 MCG/ML (10ML) SYRINGE FOR IV PUSH (FOR BLOOD PRESSURE SUPPORT)
PREFILLED_SYRINGE | INTRAVENOUS | Status: DC | PRN
  Administered 2024-08-16: 160 ug via INTRAVENOUS

## 2024-08-16 MED ORDER — VANCOMYCIN HCL 1000 MG IV SOLR
INTRAVENOUS | Status: DC | PRN
  Administered 2024-08-16: 1000 mg via TOPICAL

## 2024-08-16 MED ORDER — FENTANYL CITRATE (PF) 50 MCG/ML IJ SOSY
50.0000 ug | PREFILLED_SYRINGE | INTRAMUSCULAR | Status: AC
  Administered 2024-08-16: 50 ug via INTRAVENOUS
  Filled 2024-08-16: qty 2

## 2024-08-16 MED ORDER — ROPIVACAINE HCL 5 MG/ML IJ SOLN
INTRAMUSCULAR | Status: DC | PRN
  Administered 2024-08-16: 15 mL via PERINEURAL
  Administered 2024-08-16: 25 mL via PERINEURAL

## 2024-08-16 MED ORDER — FENTANYL CITRATE (PF) 100 MCG/2ML IJ SOLN
INTRAMUSCULAR | Status: DC | PRN
  Administered 2024-08-16: 50 ug via INTRAVENOUS

## 2024-08-16 SURGICAL SUPPLY — 50 items
BAG COUNTER SPONGE SURGICOUNT (BAG) IMPLANT
BANDAGE ESMARK 6X9 LF (GAUZE/BANDAGES/DRESSINGS) IMPLANT
BIT DRILL QC 2.0 SHORT EVOS SM (DRILL) IMPLANT
BIT DRILL QC 2.5MM SHRT EVO SM (DRILL) IMPLANT
BNDG COHESIVE 4X5 TAN STRL LF (GAUZE/BANDAGES/DRESSINGS) IMPLANT
BNDG ELASTIC 6INX 5YD STR LF (GAUZE/BANDAGES/DRESSINGS) ×2 IMPLANT
CHLORAPREP W/TINT 26 (MISCELLANEOUS) ×2 IMPLANT
CLSR STERI-STRIP ANTIMIC 1/2X4 (GAUZE/BANDAGES/DRESSINGS) IMPLANT
COVER MAYO STAND STRL (DRAPES) IMPLANT
CUFF TRNQT CYL 34X4.125X (TOURNIQUET CUFF) ×2 IMPLANT
DRAPE C-ARM 42X120 X-RAY (DRAPES) ×2 IMPLANT
DRAPE C-ARMOR (DRAPES) IMPLANT
DRAPE U-SHAPE 47X51 STRL (DRAPES) ×2 IMPLANT
ELECT PENCIL ROCKER SW 15FT (MISCELLANEOUS) ×2 IMPLANT
ELECT REM PT RETURN 15FT ADLT (MISCELLANEOUS) ×2 IMPLANT
GAUZE SPONGE 4X4 12PLY STRL (GAUZE/BANDAGES/DRESSINGS) ×2 IMPLANT
GAUZE XEROFORM 5X9 LF (GAUZE/BANDAGES/DRESSINGS) ×2 IMPLANT
GLOVE BIOGEL PI IND STRL 8 (GLOVE) ×4 IMPLANT
GLOVE SURG LX STRL 7.5 STRW (GLOVE) ×4 IMPLANT
GOWN STRL REUS W/ TWL XL LVL3 (GOWN DISPOSABLE) ×4 IMPLANT
KIT BASIN OR (CUSTOM PROCEDURE TRAY) ×2 IMPLANT
KIT TURNOVER KIT A (KITS) ×2 IMPLANT
NS IRRIG 1000ML POUR BTL (IV SOLUTION) ×2 IMPLANT
PACK ORTHO EXTREMITY (CUSTOM PROCEDURE TRAY) ×2 IMPLANT
PAD CAST 4YDX4 CTTN HI CHSV (CAST SUPPLIES) ×2 IMPLANT
PADDING CAST SYNTHETIC 4X4 STR (CAST SUPPLIES) ×4 IMPLANT
PIN PROV FIXATION 2.0X14 (WIRE) IMPLANT
PIN PROVISIONAL FIXATION 14 (WIRE) IMPLANT
PLATE L.DISTAL 2.7/3.5 7H EVOS (Plate) IMPLANT
SCREW CORT 2.7X12 EVOS (Screw) IMPLANT
SCREW CORT 2.7X14 T8 EVOS (Screw) IMPLANT
SCREW CORT 3.5X14 ST EVOS (Screw) IMPLANT
SCREW CORT 3.5X44MM ST EVOS (Screw) IMPLANT
SCREW CORT EVOS ST 3.5X12 (Screw) IMPLANT
SCREW CTX ST EVOS 3.5X40 (Screw) IMPLANT
SPIKE FLUID TRANSFER (MISCELLANEOUS) IMPLANT
SPLINT PLASTER CAST XFAST 5X30 (CAST SUPPLIES) ×40 IMPLANT
STAPLER SKIN PROX 35W (STAPLE) IMPLANT
STRAP ANKLE DISTRACTOR (MISCELLANEOUS) ×2 IMPLANT
STRIP CLOSURE SKIN 1/2X4 (GAUZE/BANDAGES/DRESSINGS) IMPLANT
SUCTION TUBE FRAZIER 10FR DISP (SUCTIONS) ×2 IMPLANT
SUT ETHILON 3 0 PS 1 (SUTURE) IMPLANT
SUT MNCRL AB 3-0 PS2 18 (SUTURE) IMPLANT
SUT PDS AB 2-0 CT2 27 (SUTURE) IMPLANT
SUT VIC AB 0 CT1 36 (SUTURE) ×2 IMPLANT
SUT VIC AB 2-0 SH 27XBRD (SUTURE) ×2 IMPLANT
SUT VIC AB 3-0 FS2 27 (SUTURE) IMPLANT
TOWEL OR DSP ST BLU DLX 10/PK (DISPOSABLE) ×2 IMPLANT
UNDERPAD 30X36 HEAVY ABSORB (UNDERPADS AND DIAPERS) ×2 IMPLANT
YANKAUER SUCT BULB TIP 10FT TU (MISCELLANEOUS) ×2 IMPLANT

## 2024-08-16 NOTE — Anesthesia Procedure Notes (Signed)
 Procedure Name: LMA Insertion Date/Time: 08/16/2024 2:05 PM  Performed by: Cindie Charleen PARAS, CRNAPre-anesthesia Checklist: Patient identified, Emergency Drugs available, Suction available, Patient being monitored and Timeout performed Patient Re-evaluated:Patient Re-evaluated prior to induction Oxygen Delivery Method: Circle system utilized Preoxygenation: Pre-oxygenation with 100% oxygen Induction Type: IV induction Ventilation: Mask ventilation without difficulty LMA: LMA inserted LMA Size: 4.0 Grade View: Grade I Number of attempts: 1 Placement Confirmation: positive ETCO2 and breath sounds checked- equal and bilateral Tube secured with: Tape Dental Injury: Teeth and Oropharynx as per pre-operative assessment

## 2024-08-16 NOTE — Anesthesia Procedure Notes (Addendum)
 Anesthesia Regional Block: Popliteal block   Pre-Anesthetic Checklist: , timeout performed,  Correct Patient, Correct Site, Correct Laterality,  Correct Procedure, Correct Position, site marked,  Risks and benefits discussed,  Surgical consent,  Pre-op evaluation,  At surgeon's request and post-op pain management  Laterality: Lower and Left  Prep: chloraprep       Needles:  Injection technique: Single-shot  Needle Type: Stimiplex     Needle Length: 10cm  Needle Gauge: 21     Additional Needles:   Procedures:,,,, ultrasound used (permanent image in chart),,   Motor weakness within 5 minutes.  Narrative:  Start time: 08/16/2024 1:34 PM End time: 08/16/2024 1:39 PM Injection made incrementally with aspirations every 5 mL.  Performed by: Personally  Anesthesiologist: Darlyn Rush, MD  Additional Notes: Nerve located and needle positioned with direct ultrasound guidance. Good perineural spread. Patient tolerated well.

## 2024-08-16 NOTE — Transfer of Care (Signed)
 Immediate Anesthesia Transfer of Care Note  Patient: Debbie Brandt  Procedure(s) Performed: LEFT ANKLE OPEN REDUCTION INTERNAL FIXATION (Left: Ankle)  Patient Location: PACU  Anesthesia Type:General and Regional  Level of Consciousness: awake, alert , oriented, and patient cooperative  Airway & Oxygen Therapy: Patient Spontanous Breathing and Patient connected to face mask oxygen  Post-op Assessment: Report given to RN and Post -op Vital signs reviewed and stable  Post vital signs: Reviewed and stable  Last Vitals:  Vitals Value Taken Time  BP 118/93 08/16/24 16:01  Temp    Pulse 70 08/16/24 16:04  Resp 14 08/16/24 16:04  SpO2 100 % 08/16/24 16:04  Vitals shown include unfiled device data.  Last Pain:  Vitals:   08/16/24 0431  TempSrc: Oral         Complications: No notable events documented.

## 2024-08-16 NOTE — Discharge Instructions (Signed)
 Orthopaedic Surgery Discharge Instructions  Surgery: Left ankle open reduction internal fixation  Weight bearing: Nonweightbearing left lower extremity  Splint: Keep splint clean and dry at all times.  Elevate the lower extremity as much as possible.  Follow up appointment: You are scheduled to follow up with Dr. Germaine. If you do not know when your follow up appointment is, please call [phone] to schedule your appointment.

## 2024-08-16 NOTE — Anesthesia Procedure Notes (Signed)
 Anesthesia Regional Block: Adductor canal block   Pre-Anesthetic Checklist: , timeout performed,  Correct Patient, Correct Site, Correct Laterality,  Correct Procedure, Correct Position, site marked,  Risks and benefits discussed,  Surgical consent,  Pre-op evaluation,  At surgeon's request and post-op pain management  Laterality: Lower and Left  Prep: chloraprep       Needles:  Injection technique: Single-shot  Needle Type: Stimiplex     Needle Length: 9cm  Needle Gauge: 21     Additional Needles:   Procedures:,,,, ultrasound used (permanent image in chart),,    Narrative:  Start time: 08/16/2024 1:19 PM End time: 08/16/2024 1:34 PM Injection made incrementally with aspirations every 5 mL.  Performed by: Personally  Anesthesiologist: Darlyn Rush, MD  Additional Notes: BP cuff, EKG monitors applied. Sedation begun. Artery and nerve location verified with ultrasound. Anesthetic injected incrementally (5ml), slowly, and after negative aspirations under direct u/s guidance. Good fascial/perineural spread. Tolerated well.

## 2024-08-16 NOTE — Anesthesia Preprocedure Evaluation (Addendum)
 Anesthesia Evaluation  Patient identified by MRN, date of birth, ID band Patient confused    Reviewed: Allergy & Precautions, NPO status , Patient's Chart, lab work & pertinent test results  Airway Mallampati: III  TM Distance: >3 FB Neck ROM: Full    Dental  (+) Dental Advisory Given   Pulmonary neg pulmonary ROS   Pulmonary exam normal breath sounds clear to auscultation       Cardiovascular hypertension, Pt. on medications + Valvular Problems/Murmurs MR  Rhythm:Regular Rate:Normal + Systolic murmurs Echo 2017 - Left ventricle: The cavity size was normal. Wall thickness was normal.  Systolic function was normal. The estimated ejection fraction was in  the range of 60% to 65%. Wall motion was normal;  there were no  regional wall motion abnormalities. Left ventricular diastolic function  parameters were normal.  - Mitral valve: Mildly thickened leaflets . There was mild regurgitation.  - Left atrium: The atrium was normal in size.  - Right atrium: The atrium was normal in size.  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  - Tricuspid valve: There was mild regurgitation.  - Pulmonary arteries: PA peak pressure: 24 mm Hg (S).  - Inferior vena cava: The vessel was normal in size. The respirophasic  diameter changes were in the normal range (= 50%), consistent with  normal central venous pressure.   Impressions:  - LVEF 60-65%, normal wall thickness and wall motion, normal diastolic  function, normal biatrial size, mild MVL thickening with mild regurgitation,  mild tricuspid regurgitation with normal RVSP, aneurysmal IAS - cannot  exclude PFO.    Cath 2017  The left ventricular ejection fraction is hyperdynamic (>65%).  Nuclear stress EF: 67%.  Blood pressure demonstrated a blunted response to exercise.  ST segment depression was noted during stress.  The study is normal.   Normal stress nuclear study  with no ischemia or infarction; EF 67 with normal wall motion.    Neuro/Psych  PSYCHIATRIC DISORDERS Anxiety Depression   Dementia negative neurological ROS     GI/Hepatic Neg liver ROS,GERD  Medicated,,  Endo/Other  Hypothyroidism  Obesity  Renal/GU Renal disease     Musculoskeletal negative musculoskeletal ROS (+)    Abdominal   Peds  Hematology  (+) Blood dyscrasia, anemia   Anesthesia Other Findings   Reproductive/Obstetrics                              Anesthesia Physical Anesthesia Plan  ASA: 3  Anesthesia Plan: General   Post-op Pain Management: Tylenol  PO (pre-op)* and Regional block*   Induction: Intravenous, Rapid sequence and Cricoid pressure planned  PONV Risk Score and Plan: 4 or greater and Treatment may vary due to age or medical condition, Ondansetron , Dexamethasone , Midazolam  and Scopolamine patch - Pre-op  Airway Management Planned: LMA  Additional Equipment: None  Intra-op Plan:   Post-operative Plan: Extubation in OR  Informed Consent: I have reviewed the patients History and Physical, chart, labs and discussed the procedure including the risks, benefits and alternatives for the proposed anesthesia with the patient or authorized representative who has indicated his/her understanding and acceptance.     Dental advisory given and Consent reviewed with POA  Plan Discussed with: CRNA  Anesthesia Plan Comments: (Risks of anesthesia explained at length. This includes, but is not limited to, sore throat, damage to teeth, lips gums, tongue and vocal cords, nausea and vomiting, reactions to medications, stroke, heart  attack, and death. All patient questions were answered and the patient wishes to proceed. )         Anesthesia Quick Evaluation

## 2024-08-16 NOTE — Progress Notes (Signed)
 Orthopaedic Surgery Progress Note  No acute issues.  Comfortable in PACU  Exam: Left Lower Extremity: Splint in place Presently moving toes Foot wwp   Assessment: 63 y.o. female * Day of Surgery * status post left ankle open reduction internal fixation  Plan: Nonweightbearing left upper extremity, keep splint clean and dry at all times, elevate OK to resume DVT chemoppx or anticoagulation on post op day 1; recommend 4 weeks of chemoppx with ASA 81 BID if no other AC is in place Perioperative ancef  for 24 PT beginning on POD#1 Discharge instructions placed in Epic

## 2024-08-16 NOTE — Brief Op Note (Signed)
 08/16/2024  3:53 PM  PATIENT:  Montie [name]  63 y.o. female  PRE-OPERATIVE DIAGNOSIS:  Left ankle fracture  POST-OPERATIVE DIAGNOSIS:  Left ankle fracture  PROCEDURE:  Procedure(s): LEFT ANKLE OPEN REDUCTION INTERNAL FIXATION (Left)  SURGEON:  Surgeons and Role:    DEWAINE Germaine Redbird, MD - Primary  PHYSICIAN ASSISTANT:   ASSISTANTS:   ANESTHESIA:   general  EBL:  25 mL   BLOOD ADMINISTERED:none  DRAINS: none   LOCAL MEDICATIONS USED:  NONE  SPECIMEN:  No Specimen  DISPOSITION OF SPECIMEN:  N/A  COUNTS:  YES  TOURNIQUET:   Total Tourniquet Time Documented: Thigh (Left) - 43 minutes Total: Thigh (Left) - 43 minutes   DICTATION: .[place]ama Dictation  PLAN OF CARE: Admit to inpatient   PATIENT DISPOSITION:  PACU - hemodynamically stable.   Delay start of Pharmacological VTE agent (>24hrs) due to surgical blood loss or risk of bleeding: no

## 2024-08-16 NOTE — Progress Notes (Signed)
 [facility]  PROCEDURAL EXPEDITER PROGRESS NOTE  Patient Name: Debbie Brandt  DOB:08/26/1961 Date of Admission: 08/15/2024  Date of Assessment:08/16/24   -------------------------------------------------------------------------------------------------------------------   Brief clinical summary: 63 yr old femalewith hx of HTN,HLD, SBO, GERD, dementia and depression. Needs help with all ADL   Orders in place:  yes   Communication with surgical team if no orders: n/a  Labs, test, and orders reviewed: yes  Requires surgical clearance:  No  What type of clearance: n/a  Clearance received: n/a  Barriers noted:n/a   Intervention provided by [facility] team: n/a  Barrier resolved:  not applicable   -------------------------------------------------------------------------------------------------------------------  [employer], Ronal DELENA Bald Please contact us  directly via secure chat (search for [facility]) or by calling us  at [phone] [facility]).

## 2024-08-16 NOTE — Op Note (Signed)
 Operative Note  Debbie Brandt Evangelist  Surgery Date: 08/16/2024 Surgeon: Dale Hock, MD  Preop Diagnosis(es):  Left bimalleolar equivalent ankle fracture  Postop Diagnosis(es):  Left bimalleolar equivalent ankle fracture  Operative Procedure(s): Open reduction internal fixation of left distal fibula fracture Open reduction internal fixation of left ankle syndesmosis  Anesthesia: The patient had administration of general anesthesia. Further details can be found in the anesthesia record.  Estimated Blood Loss: 50 mL  Complications:  None noted intraoperatively  Drains: None  Tourniquet Time: 40 minutes at  Implants:  [employer] lateral locking plate x 1, syndesmotic screws x 2. Full detailed list below.  Indications:  Debbie Brandt is a 63 y.o. year old female who presents hospital due to a left ankle injury.  She was found to have a bimalleolar equivalent ankle fracture.  She is admitted due to social issues as well as medical and we discussed nonoperative or surgical management with her brother which is documented in my consult note..  After thorough discussion of the risks and benefits of surgical management and alternative nonoperative treatment options, they elected to proceed with surgical treatment. Risks and complications were discussed and understood including, but not limited to, bleeding, infection, stiffness, numbness, damage to surrounding structures (including blood vessels and nerves), failure of the procedure, need for secondary or revision procedures, failure of healing, incomplete functional recovery, and worsening or chronic pain. Additional risks pertinent to the surgery and anesthesia also include pulmonary compromise, blood clots/pulmonary embolism, cardiac complications, and death. No guarantees were stated or implied. All questions were answered to the best of my ability and the patient verbalized understanding.    Description of Procedure:  The  patient was identified in the holding area, taken to the operating room and underwent successful induction of anesthesia. They were then placed on the operating table in a supine position, with all bony prominences well padded. Preprocedure antibiotics were administered. A time out was performed and all parties were in agreement with the patient identification, surgical site and planned procedure. The extremity was then prepped and draped in usual sterile fashion. A second time out was performed prior to incision, once again confirming the patient, site, procedure and expectations of surgery.  The limb was exsanguinated and the tourniquet was inflated to 250 mmHg.  A standard lateral incision was made over the distal fibula.  Full-thickness skin flaps were raised and the tibial surface was exposed using a periosteal elevator.  The fracture was identified and debrided using combination of curettes and rongeur's as well as irrigation.  Reduction was then obtained with point-to-point clamp.  After obtaining reduction the bone did not seem tolerant of lag screw fixation.  Therefore we proceeded with applying our plate.  A plate was chosen and placed and position was confirmed on biplanar fluoroscopy.  This was pinned into place and then subsequently distal locking screws and proximal cortical screws were placed.  Implant positioning as well as distal fibula reduction was confirmed on biplanar fluoroscopy.  We then performed stress radiography demonstrating ongoing medial clear space widening.  We therefore proceeded as expected with syndesmotic fixation.  2 bicortical syndesmotic screws were placed through the plate and confirmed location with biplanar fluoroscopy.  Final fluoroscopic views confirmed reduction of both the distal fibula fracture and syndesmosis as well as appropriate hardware placement.  The incision was thoroughly irrigated and the wound was closed in layers using 0 Vicryl and 3-0 Monocryl.  She  was then placed into a  sterile dressing and splint.  All counts at the end of the case were correct.  Post-Operative Condition: The patient was transferred to the PACU in stable condition.  Post-Operative Plan: They will be nonweightbearing in a splint at all times.  They will follow our ankle fracture rehabilitation protocol.  We will see them back in 2 to 3 weeks for a wound check and for further review of surgical findings.  Implant Record:  Implant Name Type Inv. Item Serial No. Manufacturer Lot No. LRB No. Used Action  PLATE L.DISTAL 2.7/3.5 7H EVOS - [id number] Plate PLATE L.DISTAL 2.7/3.5 7H [employer]  [employer]  Left 1 Implanted  SCREW CORT 2.7X14 T8 EVOS - [id number] Screw SCREW CORT 2.7X14 T8 EVOS  [employer]  Left 2 Implanted  SCREW CORT 2.7X12 EVOS - [id number] Screw SCREW CORT 2.7X12 EVOS  [employer]  Left 4 Implanted  SCREW CORT EVOS ST 3.5X12 - [id number] Screw SCREW CORT EVOS ST 3.5X12  [employer]  Left 3 Implanted  SCREW CORT 3.5X44MM ST EVOS - [id number] Screw SCREW CORT 3.5X44MM ST EVOS  [employer]  Left 1 Implanted

## 2024-08-16 NOTE — Plan of Care (Signed)
  Problem: Clinical Measurements: Goal: Respiratory complications will improve Outcome: Progressing Goal: Cardiovascular complication will be avoided Outcome: Progressing   Problem: Activity: Goal: Risk for activity intolerance will decrease Outcome: Progressing   Problem: Coping: Goal: Level of anxiety will decrease Outcome: Progressing   Problem: Elimination: Goal: Will not experience complications related to urinary retention Outcome: Progressing   Problem: Pain Managment: Goal: General experience of comfort will improve and/or be controlled Outcome: Progressing   Problem: Safety: Goal: Ability to remain free from injury will improve Outcome: Progressing   Problem: Skin Integrity: Goal: Risk for impaired skin integrity will decrease Outcome: Progressing

## 2024-08-16 NOTE — Progress Notes (Signed)
 PROGRESS NOTE    Debbie Brandt  FMW:[id number] DOB: Nov 01, 1960 DOA: 08/15/2024 PCP: Lendia Boby LITTIE, NP-C   Brief Narrative: Patient lives at home with her brother and is dependent on him for all ADLs due to dementia.  This is a 63 year old female with history of dementia hypertension hyperlipidemia hypothyroidism SBO depression GERD came to the ER for complaints of left foot pain and is found to have acute fracture through the distal fibula and medial subluxation of the distal tibia in relation to the talus with soft tissue swelling of the left lower extremity and foot.  Denies falls or trauma.  At baseline she ambulates without an assistive device.   Assessment & Plan:   Principal Problem:   AKI (acute kidney injury) Active Problems:   Closed fracture of left ankle   Severe Alzheimer's dementia (HCC)   Insomnia   #1 acute distal fibula fracture/left foot fracture patient with history of dementia at baseline walks around without any assistive device.  Unclear if she fell at home.  Left ankle was reduced by ED physician with repeat x-ray significant improvement fracture and ankle joint alignment.  Ortho was consulted in the ED.  Patient is kept n.p.o. for possible OR today. Pain control with Tylenol  and Oxy. ?  Could be orthostatic with a soft BP on triamterene  hydrochlorothiazide  and Cozaar  at home. #2 AKI her baseline is around 0.9-1.1.  She was admitted with a creatinine of 3.18.  Improving creatinine down to 2.68  creatinine was 1.87 at the time of discharge after her last admission in November 2025.  Continue IV fluids and recheck labs in AM.  Avoid nephrotoxins. She was admitted to the hospital for 2 days last month with AKI at the time of discharge her blood pressure medications were told to be on hold unclear if she was still taking at home. #3 dementia patient is mostly nonverbal continue Aricept  and Namenda  #4 hypertension she is on Cozaar  and triamterene  hydrochlorothiazide   which she is on hold due to her blood pressure being soft.  Monitor closely and restart as appropriate.  Continue IV fluids.  Last blood pressure is 131/51. #5 hyperlipidemia can continue  statin #6 hypothyroidism continue Synthroid  #7 insomnia continue home meds mirtazapine  #8 anemia of chronic disease hemoglobin 7.3 from 9.6 with no evidence of active bleed status post IV fluid boluses and hydration. Send anemia profile.  Estimated body mass index is 26.15 kg/m as calculated from the following:   Height as of this encounter: 5' 4 (1.626 m).   Weight as of this encounter: 69.1 kg.  DVT prophylaxis: None Code Status: Full code  family Communication: None at bedside  disposition Plan:  Status is: Inpatient Remains inpatient appropriate because: Acute fracture   Consultants:  Ortho  Procedures: Reduction of the left ankle fracture in the ED by ED physician Antimicrobials: None  Subjective: She is awake in no acute distress nonverbal no overnight events reported  Objective: Vitals:   08/15/24 2000 08/15/24 2025 08/15/24 2358 08/16/24 0431  BP: (!) 117/56 (!) 153/62 (!) 158/68 (!) 131/51  Pulse: 61 71 84 72  Resp: 13 18 18 18   Temp:  98.8 F (37.1 C) 98.3 F (36.8 C) 99 F (37.2 C)  TempSrc:  Axillary Axillary Oral  SpO2: 100% 100% 100% 100%  Weight:  69.1 kg    Height:        Intake/Output Summary (Last 24 hours) at 08/16/2024 1059 Last data filed at 08/16/2024 0019 Gross per 24 hour  Intake 240 ml  Output 500 ml  Net -260 ml   Filed Weights   08/15/24 1532 08/15/24 2025  Weight: 70.8 kg 69.1 kg    Examination:  General exam: Appears in no acute distress Respiratory system: Clear to auscultation. Respiratory effort normal. Cardiovascular system: S1 & S2 heard, RRR. No JVD, murmurs, rubs, gallops or clicks. No pedal edema. Gastrointestinal system: Abdomen is nondistended, soft and nontender. No organomegaly or masses felt. Normal bowel sounds heard. Central  nervous system: Awake Extremities: LLE in splint  Data Reviewed: I have personally reviewed following labs and imaging studies  CBC: Recent Labs  Lab 08/15/24 1622 08/16/24 0431  WBC 10.4 6.7  NEUTROABS 7.4  --   HGB 9.6* 7.3*  HCT 30.2* 23.8*  MCV 89.3 91.9  PLT 428* 344   Basic Metabolic Panel: Recent Labs  Lab 08/15/24 1622 08/16/24 0431  NA 140 138  K 4.5 3.8  CL 102 104  CO2 24 23  GLUCOSE 100* 90  BUN 35* 33*  CREATININE 3.18* 2.68*  CALCIUM  9.4 8.7*  MG 1.9  --    GFR: Estimated Creatinine Clearance: 20 mL/min (A) (by C-G formula based on SCr of 2.68 mg/dL (H)). Liver Function Tests: Recent Labs  Lab 08/15/24 1622  AST 37  ALT 28  ALKPHOS 76  BILITOT 0.6  PROT 7.6  ALBUMIN  3.8   No results for input(s): LIPASE, AMYLASE in the last 168 hours. No results for input(s): AMMONIA in the last 168 hours. Coagulation Profile: No results for input(s): INR, PROTIME in the last 168 hours. Cardiac Enzymes: No results for input(s): CKTOTAL, CKMB, CKMBINDEX, TROPONINI in the last 168 hours. BNP (last 3 results) Recent Labs    03/15/24 1620  PROBNP 266.0   HbA1C: No results for input(s): HGBA1C in the last 72 hours. CBG: Recent Labs  Lab 08/16/24 0429  GLUCAP 100*   Lipid Profile: No results for input(s): CHOL, HDL, LDLCALC, TRIG, CHOLHDL, LDLDIRECT in the last 72 hours. Thyroid Function Tests: No results for input(s): TSH, T4TOTAL, FREET4, T3FREE, THYROIDAB in the last 72 hours. Anemia Panel: No results for input(s): VITAMINB12, FOLATE, FERRITIN, TIBC, IRON, RETICCTPCT in the last 72 hours. Sepsis Labs: No results for input(s): PROCALCITON, LATICACIDVEN in the last 168 hours.  No results found for this or any previous visit (from the past 240 hours).       Radiology Studies: US  RENAL Result Date: 08/15/2024 CLINICAL DATA:  Acute kidney injury EXAM: RENAL / URINARY TRACT ULTRASOUND  COMPLETE COMPARISON:  CT abdomen and pelvis 08/03/2024 FINDINGS: Right Kidney: Renal measurements: 11.1 x 4.6 x 5.5 cm = volume: 147 mL. Echogenicity within normal limits. No mass or hydronephrosis visualized. Left Kidney: Renal measurements: 10.1 x 5.2 x 6.3 cm = volume: 171 mL. Echogenicity within normal limits. There is no hydronephrosis. There is a simple cyst in the superior pole measuring 3.0 x 2.7 x 2.9 cm. Bladder: Appears normal for degree of bladder distention. Other: None. IMPRESSION: 1. No hydronephrosis. 2. Simple cyst in the superior pole of the left kidney. Electronically Signed   By: Greig Pique M.D.   On: 08/15/2024 22:23   DG Ankle 2 Views Left Result Date: 08/15/2024 CLINICAL DATA:  Post closed reduction of left ankle fractures. EXAM: LEFT ANKLE - 2 VIEW COMPARISON:  08/15/2024 at 4:12 p.m. FINDINGS: Lateral talar subluxation has been mostly reduced. There is significant improved alignment of the distal fibular fracture now with 2-3 mm of residual lateral displacement. Ankle is supported  in a fiberglass cast. IMPRESSION: Significant improved fracture and ankle joint alignment following closed reduction. Electronically Signed   By: Alm Parkins M.D.   On: 08/15/2024 18:38   DG Tibia/Fibula Left Result Date: 08/15/2024 CLINICAL DATA:  Pain. EXAM: LEFT FOOT - 2 VIEW; LEFT KNEE - COMPLETE 4+ VIEW; LEFT TIBIA AND FIBULA - 2 VIEW; LEFT ANKLE - 2 VIEW COMPARISON:  Left knee x-ray 01/15/2022 FINDINGS: Left knee: There is no evidence of fracture or dislocation. Joint spaces are well maintained. There is mild tricompartmental osteophyte formation. Soft tissues are unremarkable. Left ankle: There is an acute oblique fracture through the distal fibula at the level of the ankle mortise. The distal fracture fragment is distracted 5 mm laterally. There is abnormal widening of the medial talotibial joint space with subluxation of the distal tibia medially 5 mm. Small avulsion fracture fragments are  seen between the tip of the medial malleolus in the adjacent talus. There is soft tissue swelling surrounding the ankle. Left foot: There is soft tissue swelling of the dorsal foot. There is no additional fracture or dislocation. There is mild hallux valgus. Rounded sclerotic density in the calcaneus measures 6 mm. Left tibia and fibula: There is soft tissue swelling of the lower extremity. There is no foreign body. There is no acute fracture or dislocation. No focal osseous lesion identified. Joint spaces are maintained. IMPRESSION: 1. Acute fracture through the distal fibula. 2. Medial subluxation of the distal tibia in relation to the talus. 3. Small avulsion fracture fragments between the tip of the medial malleolus and the adjacent talus. 4. Soft tissue swelling of the lower extremity and foot. 5. No additional acute fracture or dislocation of the left knee, left foot, or left tibia and fibula. Electronically Signed   By: Greig Pique M.D.   On: 08/15/2024 17:01   DG Knee Complete 4 Views Left Result Date: 08/15/2024 CLINICAL DATA:  Pain. EXAM: LEFT FOOT - 2 VIEW; LEFT KNEE - COMPLETE 4+ VIEW; LEFT TIBIA AND FIBULA - 2 VIEW; LEFT ANKLE - 2 VIEW COMPARISON:  Left knee x-ray 01/15/2022 FINDINGS: Left knee: There is no evidence of fracture or dislocation. Joint spaces are well maintained. There is mild tricompartmental osteophyte formation. Soft tissues are unremarkable. Left ankle: There is an acute oblique fracture through the distal fibula at the level of the ankle mortise. The distal fracture fragment is distracted 5 mm laterally. There is abnormal widening of the medial talotibial joint space with subluxation of the distal tibia medially 5 mm. Small avulsion fracture fragments are seen between the tip of the medial malleolus in the adjacent talus. There is soft tissue swelling surrounding the ankle. Left foot: There is soft tissue swelling of the dorsal foot. There is no additional fracture or  dislocation. There is mild hallux valgus. Rounded sclerotic density in the calcaneus measures 6 mm. Left tibia and fibula: There is soft tissue swelling of the lower extremity. There is no foreign body. There is no acute fracture or dislocation. No focal osseous lesion identified. Joint spaces are maintained. IMPRESSION: 1. Acute fracture through the distal fibula. 2. Medial subluxation of the distal tibia in relation to the talus. 3. Small avulsion fracture fragments between the tip of the medial malleolus and the adjacent talus. 4. Soft tissue swelling of the lower extremity and foot. 5. No additional acute fracture or dislocation of the left knee, left foot, or left tibia and fibula. Electronically Signed   By: Greig Pique M.D.   On:  08/15/2024 17:01   DG Foot 2 Views Left Result Date: 08/15/2024 CLINICAL DATA:  Pain. EXAM: LEFT FOOT - 2 VIEW; LEFT KNEE - COMPLETE 4+ VIEW; LEFT TIBIA AND FIBULA - 2 VIEW; LEFT ANKLE - 2 VIEW COMPARISON:  Left knee x-ray 01/15/2022 FINDINGS: Left knee: There is no evidence of fracture or dislocation. Joint spaces are well maintained. There is mild tricompartmental osteophyte formation. Soft tissues are unremarkable. Left ankle: There is an acute oblique fracture through the distal fibula at the level of the ankle mortise. The distal fracture fragment is distracted 5 mm laterally. There is abnormal widening of the medial talotibial joint space with subluxation of the distal tibia medially 5 mm. Small avulsion fracture fragments are seen between the tip of the medial malleolus in the adjacent talus. There is soft tissue swelling surrounding the ankle. Left foot: There is soft tissue swelling of the dorsal foot. There is no additional fracture or dislocation. There is mild hallux valgus. Rounded sclerotic density in the calcaneus measures 6 mm. Left tibia and fibula: There is soft tissue swelling of the lower extremity. There is no foreign body. There is no acute fracture or  dislocation. No focal osseous lesion identified. Joint spaces are maintained. IMPRESSION: 1. Acute fracture through the distal fibula. 2. Medial subluxation of the distal tibia in relation to the talus. 3. Small avulsion fracture fragments between the tip of the medial malleolus and the adjacent talus. 4. Soft tissue swelling of the lower extremity and foot. 5. No additional acute fracture or dislocation of the left knee, left foot, or left tibia and fibula. Electronically Signed   By: Greig Pique M.D.   On: 08/15/2024 17:01   DG Ankle 2 Views Left Result Date: 08/15/2024 CLINICAL DATA:  Pain. EXAM: LEFT FOOT - 2 VIEW; LEFT KNEE - COMPLETE 4+ VIEW; LEFT TIBIA AND FIBULA - 2 VIEW; LEFT ANKLE - 2 VIEW COMPARISON:  Left knee x-ray 01/15/2022 FINDINGS: Left knee: There is no evidence of fracture or dislocation. Joint spaces are well maintained. There is mild tricompartmental osteophyte formation. Soft tissues are unremarkable. Left ankle: There is an acute oblique fracture through the distal fibula at the level of the ankle mortise. The distal fracture fragment is distracted 5 mm laterally. There is abnormal widening of the medial talotibial joint space with subluxation of the distal tibia medially 5 mm. Small avulsion fracture fragments are seen between the tip of the medial malleolus in the adjacent talus. There is soft tissue swelling surrounding the ankle. Left foot: There is soft tissue swelling of the dorsal foot. There is no additional fracture or dislocation. There is mild hallux valgus. Rounded sclerotic density in the calcaneus measures 6 mm. Left tibia and fibula: There is soft tissue swelling of the lower extremity. There is no foreign body. There is no acute fracture or dislocation. No focal osseous lesion identified. Joint spaces are maintained. IMPRESSION: 1. Acute fracture through the distal fibula. 2. Medial subluxation of the distal tibia in relation to the talus. 3. Small avulsion fracture  fragments between the tip of the medial malleolus and the adjacent talus. 4. Soft tissue swelling of the lower extremity and foot. 5. No additional acute fracture or dislocation of the left knee, left foot, or left tibia and fibula. Electronically Signed   By: Greig Pique M.D.   On: 08/15/2024 17:01   Scheduled Meds:  acetaminophen   1,000 mg Oral TID   atorvastatin   20 mg Oral Daily   cyanocobalamin   1,000  mcg Oral Daily   donepezil   10 mg Oral QHS   feeding supplement  237 mL Oral BID BM   levothyroxine   75 mcg Oral Q0600   memantine   10 mg Oral BID   mirtazapine   15 mg Oral QHS   Continuous Infusions:   ceFAZolin  (ANCEF ) IV     lactated ringers  125 mL/hr at 08/16/24 9487   tranexamic acid        LOS: 1 day    Almarie KANDICE Hoots, MD 08/16/2024, 10:59 AM

## 2024-08-16 NOTE — TOC Initial Note (Signed)
 Transition of Care [facility]) - Initial/Assessment Note    Patient Details  Name: Debbie Brandt MRN: [id number] Date of Birth: [birth year]  Transition of Care [facility]) CM/SW Contact:    Heather DELENA Saltness, LCSW Phone Number: 08/16/2024, 3:59 PM  Clinical Narrative:                 CSW spoke with pt's brother/POA, Lynwood Law [phone], via phone call to discuss discharge planning. Pt's brother is sole caregiver for pt. Pt has advanced dementia, mostly non-verbal, disoriented. Pt uses RW at baseline. Pt's brother denies any HH services or any other DME. Pt's brother will transport pt home upon discharge. TOC will continue to follow.  Expected Discharge Plan: Home/Self Care Barriers to Discharge: Continued Medical Work up   Patient Goals and CMS Choice Patient states their goals for this hospitalization and ongoing recovery are:: To return home        Expected Discharge Plan and Services In-house Referral: Clinical Social Work Discharge Planning Services: NA Post Acute Care Choice: NA Living arrangements for the past 2 months: Single Family Home                 DME Arranged: N/A DME Agency: NA       HH Arranged: NA HH Agency: NA        Prior Living Arrangements/Services Living arrangements for the past 2 months: Single Family Home Lives with:: Siblings Patient language and need for interpreter reviewed:: Yes Do you feel safe going back to the place where you live?: Yes      Need for Family Participation in Patient Care: Yes (Comment) Care giver support system in place?: Yes (comment) Current home services: DME Criminal Activity/Legal Involvement Pertinent to Current Situation/Hospitalization: No - Comment as needed  Activities of Daily Living   ADL Screening (condition at time of admission) Independently performs ADLs?: Yes (appropriate for developmental age) Is the patient deaf or have difficulty hearing?: No Does the patient have difficulty seeing, even when wearing  glasses/contacts?: No Does the patient have difficulty concentrating, remembering, or making decisions?: Yes  Permission Sought/Granted   Permission granted to share information with : No    Emotional Assessment Appearance:: Appears stated age Attitude/Demeanor/Rapport: Unable to Assess Affect (typically observed): Unable to Assess Orientation: : Oriented to Self Alcohol / Substance Use: Not Applicable Psych Involvement: No (comment)  Admission diagnosis:  AKI (acute kidney injury) [N17.9] Acute kidney injury [N17.9] Closed fracture of left ankle, initial encounter [D17.107J] Patient Active Problem List   Diagnosis Date Noted   AKI (acute kidney injury) 08/15/2024   Closed fracture of left ankle 08/15/2024   Severe Alzheimer's dementia (HCC) 08/15/2024   Insomnia 08/15/2024   Normocytic anemia 08/05/2024   Inadequate oral intake 08/05/2024   Hyperlipidemia 08/04/2024   Goals of care, counseling/discussion 08/04/2024   Hypomagnesemia 08/04/2024   Acute kidney injury 08/03/2024   Gastroesophageal reflux disease 03/18/2024   Cognitive impairment 06/27/2022   Poor appetite 06/27/2022   Anxiety 06/27/2022   Visual hallucinations 06/27/2022   Rhinorrhea 06/27/2022   Acute pain of left knee 01/15/2022   Memory loss 01/15/2022   Intermittent palpitations 11/21/2017   Obesity (BMI 30-39.9) 11/21/2017   Small bowel obstruction due to adhesions (HCC) 10/28/2016   Small bowel obstruction s/p ex lap & lysis of adhesions 10/28/2016 10/23/2016   Abdominal mass 10/23/2016   Hypokalemia 10/23/2016   Murmur 09/26/2015   Chest pain 09/26/2015   HTN (hypertension) 09/26/2015   Hypothyroidism 09/26/2015   PCP:  Lendia Boby CROME, NP-C Pharmacy:   [facility] [id number] - [place], [place] - [address] [place] [address] Phone: [phone] Fax: [phone]     Social Drivers of Health (SDOH) Social History: SDOH Screenings   Food  Insecurity: No Food Insecurity (08/16/2024)  Housing: Low Risk  (08/16/2024)  Transportation Needs: No Transportation Needs (08/16/2024)  Utilities: Not At Risk (08/16/2024)  Depression (PHQ2-9): Low Risk  (08/03/2024)  Physical Activity: Inactive (08/03/2024)  Social Connections: Unknown (08/16/2024)  Recent Concern: Social Connections - Socially Isolated (08/03/2024)  Stress: No Stress Concern Present (08/03/2024)  Tobacco Use: Low Risk  (08/15/2024)  Health Literacy: Adequate Health Literacy (08/03/2024)   SDOH Interventions: Food Insecurity Interventions: Intervention Not Indicated Housing Interventions: Intervention Not Indicated Transportation Interventions: Intervention Not Indicated Utilities Interventions: Intervention Not Indicated Social Connections Interventions: Intervention Not Indicated   Readmission Risk Interventions    08/16/2024    3:57 PM  Readmission Risk Prevention Plan  Transportation Screening Complete  PCP or Specialist Appt within 5-7 Days Complete  Home Care Screening Complete  Medication Review (RN CM) Complete    Signed: Heather Saltness, MSW, LCSW Clinical Social Worker Inpatient Care Management 08/16/2024 4:01 PM

## 2024-08-16 NOTE — Plan of Care (Signed)
°  Problem: Clinical Measurements: Goal: Will remain free from infection Outcome: Progressing   Problem: Activity: Goal: Risk for activity intolerance will decrease Outcome: Progressing   Problem: Nutrition: Goal: Adequate nutrition will be maintained Outcome: Progressing

## 2024-08-17 LAB — BASIC METABOLIC PANEL WITH GFR
Anion gap: 10 (ref 5–15)
BUN: 30 mg/dL — ABNORMAL HIGH (ref 8–23)
CO2: 23 mmol/L (ref 22–32)
Calcium: 8.7 mg/dL — ABNORMAL LOW (ref 8.9–10.3)
Chloride: 109 mmol/L (ref 98–111)
Creatinine, Ser: 2.14 mg/dL — ABNORMAL HIGH (ref 0.44–1.00)
GFR, Estimated: 25 mL/min — ABNORMAL LOW (ref >60)
Glucose, Bld: 129 mg/dL — ABNORMAL HIGH (ref 70–99)
Potassium: 4.3 mmol/L (ref 3.5–5.1)
Sodium: 142 mmol/L (ref 135–145)

## 2024-08-17 LAB — CBC
HCT: 23.4 % — ABNORMAL LOW (ref 36.0–46.0)
Hemoglobin: 7.3 g/dL — ABNORMAL LOW (ref 12.0–15.0)
MCH: 28.2 pg (ref 26.0–34.0)
MCHC: 31.2 g/dL (ref 30.0–36.0)
MCV: 90.3 fL (ref 80.0–100.0)
Platelets: 376 K/uL (ref 150–400)
RBC: 2.59 MIL/uL — ABNORMAL LOW (ref 3.87–5.11)
RDW: 13.5 % (ref 11.5–15.5)
WBC: 9.5 K/uL (ref 4.0–10.5)
nRBC: 0 % (ref 0.0–0.2)

## 2024-08-17 MED ORDER — QUETIAPINE FUMARATE 25 MG PO TABS
12.5000 mg | ORAL_TABLET | Freq: Every day | ORAL | Status: DC
  Administered 2024-08-17 – 2024-08-20 (×4): 12.5 mg via ORAL
  Filled 2024-08-17 (×4): qty 1

## 2024-08-17 MED ORDER — ASPIRIN 81 MG PO TBEC
81.0000 mg | DELAYED_RELEASE_TABLET | Freq: Two times a day (BID) | ORAL | Status: DC
  Administered 2024-08-17 – 2024-08-22 (×11): 81 mg via ORAL
  Filled 2024-08-17 (×11): qty 1

## 2024-08-17 MED ORDER — LOSARTAN POTASSIUM 50 MG PO TABS
50.0000 mg | ORAL_TABLET | Freq: Every day | ORAL | Status: DC
  Administered 2024-08-17: 50 mg via ORAL
  Filled 2024-08-17: qty 1

## 2024-08-17 NOTE — Progress Notes (Signed)
 PT Cancellation Note  Patient Details Name: Debbie Brandt MRN: [id number] DOB: 01/25/1961   Cancelled Treatment:    Reason Eval/Treat Not Completed: Pain limiting ability to participate. PT communicated with nurse and brother, goal to maximize pharmaceutical pain management prior to PT eval. PT to return later in the day as schedule allows. PT to continue to follow acutely.   [place], PT Acute Rehab   [place] VEAR Drone 08/17/2024, 4:02 PM

## 2024-08-17 NOTE — Anesthesia Postprocedure Evaluation (Signed)
 Anesthesia Post Note  Patient: Debbie Brandt  Procedure(s) Performed: LEFT ANKLE OPEN REDUCTION INTERNAL FIXATION (Left: Ankle)     Patient location during evaluation: PACU Anesthesia Type: General Level of consciousness: awake and alert Pain management: pain level controlled Vital Signs Assessment: post-procedure vital signs reviewed and stable Respiratory status: spontaneous breathing, nonlabored ventilation, respiratory function stable and patient connected to nasal cannula oxygen Cardiovascular status: blood pressure returned to baseline and stable Postop Assessment: no apparent nausea or vomiting Anesthetic complications: no   No notable events documented.                [place] MARLA Cornea

## 2024-08-17 NOTE — Progress Notes (Addendum)
 PROGRESS NOTE    MONCIA ANNAS  FMW:[id number] DOB: March 04, 1961 DOA: 08/15/2024 PCP: Lendia Boby LITTIE, NP-C   Brief Narrative: Patient lives at home with her brother and is dependent on him for all ADLs due to dementia.  This is a 63 year old female with history of dementia hypertension hyperlipidemia hypothyroidism SBO depression GERD came to the ER for complaints of left foot pain and is found to have acute fracture through the distal fibula and medial subluxation of the distal tibia in relation to the talus with soft tissue swelling of the left lower extremity and foot.  Denies falls or trauma.  At baseline she ambulates without an assistive device.   Assessment & Plan:   Principal Problem:   AKI (acute kidney injury) Active Problems:   Closed fracture of left ankle   Severe Alzheimer's dementia (HCC)   Insomnia   #1 Acute distal fibula fracture/left foot fracture -status post left ankle open reduction internal fixation on 08/16/2024.  Per Ortho she is nonweightbearing to the left lower extremity, keep the splint clean and dry at all times, elevate, aspirin 81 twice daily for 4 weeks. Patient with history of dementia at baseline walks around without any assistive device.  Unclear if she fell at home.  Left ankle was reduced by ED physician with repeat x-ray significant improvement fracture and ankle joint alignment.  Ortho was consulted in the ED.   Pain control with Tylenol  and Oxy. Tylenol  tid ?  Could have been  orthostatic with a soft BP on triamterene  hydrochlorothiazide  and Cozaar  at home. #2 AKI her baseline is around 0.9-1.1.  She was admitted with a creatinine of 3.18.  Improving creatinine down to 2.68, labs pending for today.  creatinine was 1.87 at the time of discharge after her last admission in November 2025.  Continue IV fluids and recheck labs in AM.  Avoid nephrotoxins. She was admitted to the hospital for 2 days last month with AKI, at the time of discharge her blood  pressure medications were told to be on hold unclear if she was still taking at home. #3 dementia patient is mostly nonverbal continue Aricept  and Namenda  Patient gets sundowning every evening when she tries to climb out of bed and ripping out IVs, I will start her on a low-dose Seroquel 12.5 nightly. #4 hypertension blood pressure now starting to trend up 155/83.  Will restart Cozaar .  Continue to hold triamterene  HCTZ, consider restarting if blood pressure is not controlled and once renal functions are back to her baseline.   #5 hyperlipidemia can continue  statin #6 hypothyroidism continue Synthroid  #7 insomnia continue home meds mirtazapine  #8 anemia of chronic disease hemoglobin 7.3 from 9.6 with no evidence of active bleed status post IV fluid boluses and hydration. Send anemia profile.  Estimated body mass index is 26.15 kg/m as calculated from the following:   Height as of this encounter: 5' 4 (1.626 m).   Weight as of this encounter: 69.1 kg.  DVT prophylaxis: None Code Status: Full code  family Communication:dw brother  disposition Plan:  Status is: Inpatient Remains inpatient appropriate because: Acute fracture   Consultants:  Ortho  Procedures: Reduction of the left ankle fracture in the ED by ED physician Antimicrobials: None  Subjective:  Patient awake resting in bed  Per nursing staff she tried to climb out of bed last night and ripped out 2 Ivs  Vitals:   08/16/24 1630 08/16/24 1647 08/16/24 1927 08/17/24 0456  BP: (!) 131/49 (!) 151/68 136/61 ROLLEN)  155/83  Pulse: 67 75 84 76  Resp: 15 15 19 18   Temp: (!) 97 F (36.1 C) 97.9 F (36.6 C) 98.6 F (37 C) 97.7 F (36.5 C)  TempSrc:   Oral Oral  SpO2: 96% 100% 100% 100%  Weight:      Height:        Intake/Output Summary (Last 24 hours) at 08/17/2024 0800 Last data filed at 08/16/2024 1800 Gross per 24 hour  Intake 2713.59 ml  Output 25 ml  Net 2688.59 ml   Filed Weights   08/15/24 1532 08/15/24 2025   Weight: 70.8 kg 69.1 kg    Examination:  General exam: Appears in no acute distress Respiratory system: Clear to auscultation. Respiratory effort normal. Cardiovascular system: S1 & S2 heard, RRR. No JVD, murmurs, rubs, gallops or clicks. No pedal edema. Gastrointestinal system: Abdomen is nondistended, soft and nontender. No organomegaly or masses felt. Normal bowel sounds heard. Central nervous system: Awake Extremities: LLE in splint  Data Reviewed: I have personally reviewed following labs and imaging studies  CBC: Recent Labs  Lab 08/15/24 1622 08/16/24 0431  WBC 10.4 6.7  NEUTROABS 7.4  --   HGB 9.6* 7.3*  HCT 30.2* 23.8*  MCV 89.3 91.9  PLT 428* 344   Basic Metabolic Panel: Recent Labs  Lab 08/15/24 1622 08/16/24 0431  NA 140 138  K 4.5 3.8  CL 102 104  CO2 24 23  GLUCOSE 100* 90  BUN 35* 33*  CREATININE 3.18* 2.68*  CALCIUM  9.4 8.7*  MG 1.9  --    GFR: Estimated Creatinine Clearance: 20 mL/min (A) (by C-G formula based on SCr of 2.68 mg/dL (H)). Liver Function Tests: Recent Labs  Lab 08/15/24 1622  AST 37  ALT 28  ALKPHOS 76  BILITOT 0.6  PROT 7.6  ALBUMIN  3.8   No results for input(s): LIPASE, AMYLASE in the last 168 hours. No results for input(s): AMMONIA in the last 168 hours. Coagulation Profile: No results for input(s): INR, PROTIME in the last 168 hours. Cardiac Enzymes: No results for input(s): CKTOTAL, CKMB, CKMBINDEX, TROPONINI in the last 168 hours. BNP (last 3 results) Recent Labs    03/15/24 1620  PROBNP 266.0   HbA1C: No results for input(s): HGBA1C in the last 72 hours. CBG: Recent Labs  Lab 08/16/24 0429  GLUCAP 100*   Lipid Profile: No results for input(s): CHOL, HDL, LDLCALC, TRIG, CHOLHDL, LDLDIRECT in the last 72 hours. Thyroid Function Tests: No results for input(s): TSH, T4TOTAL, FREET4, T3FREE, THYROIDAB in the last 72 hours. Anemia Panel: Recent Labs     08/16/24 0431  FERRITIN 576*  TIBC 161*  IRON 25*   Sepsis Labs: No results for input(s): PROCALCITON, LATICACIDVEN in the last 168 hours.  Recent Results (from the past 240 hours)  Surgical pcr screen     Status: None   Collection Time: 08/16/24 10:37 AM   Specimen: Nasal Mucosa; Nasal Swab  Result Value Ref Range Status   MRSA, PCR NEGATIVE NEGATIVE Final   Staphylococcus aureus NEGATIVE NEGATIVE Final    Comment: (NOTE) The Xpert SA Assay (FDA approved for NASAL specimens in patients 28 years of age and older), is one component of a comprehensive surveillance program. It is not intended to diagnose infection nor to guide or monitor treatment. Performed at [facility], [address] [place], [place] [address]          Radiology Studies: DG Ankle 2 Views Left Result Date: 08/16/2024 CLINICAL DATA:  Elective surgery. EXAM: LEFT ANKLE - 2 VIEW COMPARISON:  Preoperative imaging FINDINGS: Seventeen fluoroscopic spot views of the ankle submitted from the operating room. Lateral plate and multi screw fixation of distal fibular fracture. There are 2 syndesmotic screws. Fluoroscopy time 25 seconds. Dose 0.51 mGy. IMPRESSION: Intraoperative fluoroscopy during ORIF of distal fibular fracture. Electronically Signed   By: Andrea Gasman M.D.   On: 08/16/2024 17:54   DG C-Arm 1-60 Min-No Report Result Date: 08/16/2024 Fluoroscopy was utilized by the requesting physician.  No radiographic interpretation.   DG C-Arm 1-60 Min-No Report Result Date: 08/16/2024 Fluoroscopy was utilized by the requesting physician.  No radiographic interpretation.   US  RENAL Result Date: 08/15/2024 CLINICAL DATA:  Acute kidney injury EXAM: RENAL / URINARY TRACT ULTRASOUND COMPLETE COMPARISON:  CT abdomen and pelvis 08/03/2024 FINDINGS: Right Kidney: Renal measurements: 11.1 x 4.6 x 5.5 cm = volume: 147 mL. Echogenicity within normal limits. No mass or hydronephrosis visualized.  Left Kidney: Renal measurements: 10.1 x 5.2 x 6.3 cm = volume: 171 mL. Echogenicity within normal limits. There is no hydronephrosis. There is a simple cyst in the superior pole measuring 3.0 x 2.7 x 2.9 cm. Bladder: Appears normal for degree of bladder distention. Other: None. IMPRESSION: 1. No hydronephrosis. 2. Simple cyst in the superior pole of the left kidney. Electronically Signed   By: Greig Pique M.D.   On: 08/15/2024 22:23   DG Ankle 2 Views Left Result Date: 08/15/2024 CLINICAL DATA:  Post closed reduction of left ankle fractures. EXAM: LEFT ANKLE - 2 VIEW COMPARISON:  08/15/2024 at 4:12 p.m. FINDINGS: Lateral talar subluxation has been mostly reduced. There is significant improved alignment of the distal fibular fracture now with 2-3 mm of residual lateral displacement. Ankle is supported in a fiberglass cast. IMPRESSION: Significant improved fracture and ankle joint alignment following closed reduction. Electronically Signed   By: Alm Parkins M.D.   On: 08/15/2024 18:38   DG Tibia/Fibula Left Result Date: 08/15/2024 CLINICAL DATA:  Pain. EXAM: LEFT FOOT - 2 VIEW; LEFT KNEE - COMPLETE 4+ VIEW; LEFT TIBIA AND FIBULA - 2 VIEW; LEFT ANKLE - 2 VIEW COMPARISON:  Left knee x-ray 01/15/2022 FINDINGS: Left knee: There is no evidence of fracture or dislocation. Joint spaces are well maintained. There is mild tricompartmental osteophyte formation. Soft tissues are unremarkable. Left ankle: There is an acute oblique fracture through the distal fibula at the level of the ankle mortise. The distal fracture fragment is distracted 5 mm laterally. There is abnormal widening of the medial talotibial joint space with subluxation of the distal tibia medially 5 mm. Small avulsion fracture fragments are seen between the tip of the medial malleolus in the adjacent talus. There is soft tissue swelling surrounding the ankle. Left foot: There is soft tissue swelling of the dorsal foot. There is no additional fracture  or dislocation. There is mild hallux valgus. Rounded sclerotic density in the calcaneus measures 6 mm. Left tibia and fibula: There is soft tissue swelling of the lower extremity. There is no foreign body. There is no acute fracture or dislocation. No focal osseous lesion identified. Joint spaces are maintained. IMPRESSION: 1. Acute fracture through the distal fibula. 2. Medial subluxation of the distal tibia in relation to the talus. 3. Small avulsion fracture fragments between the tip of the medial malleolus and the adjacent talus. 4. Soft tissue swelling of the lower extremity and foot. 5. No additional acute fracture or dislocation of the left knee, left foot, or left tibia  and fibula. Electronically Signed   By: Greig Pique M.D.   On: 08/15/2024 17:01   DG Knee Complete 4 Views Left Result Date: 08/15/2024 CLINICAL DATA:  Pain. EXAM: LEFT FOOT - 2 VIEW; LEFT KNEE - COMPLETE 4+ VIEW; LEFT TIBIA AND FIBULA - 2 VIEW; LEFT ANKLE - 2 VIEW COMPARISON:  Left knee x-ray 01/15/2022 FINDINGS: Left knee: There is no evidence of fracture or dislocation. Joint spaces are well maintained. There is mild tricompartmental osteophyte formation. Soft tissues are unremarkable. Left ankle: There is an acute oblique fracture through the distal fibula at the level of the ankle mortise. The distal fracture fragment is distracted 5 mm laterally. There is abnormal widening of the medial talotibial joint space with subluxation of the distal tibia medially 5 mm. Small avulsion fracture fragments are seen between the tip of the medial malleolus in the adjacent talus. There is soft tissue swelling surrounding the ankle. Left foot: There is soft tissue swelling of the dorsal foot. There is no additional fracture or dislocation. There is mild hallux valgus. Rounded sclerotic density in the calcaneus measures 6 mm. Left tibia and fibula: There is soft tissue swelling of the lower extremity. There is no foreign body. There is no acute  fracture or dislocation. No focal osseous lesion identified. Joint spaces are maintained. IMPRESSION: 1. Acute fracture through the distal fibula. 2. Medial subluxation of the distal tibia in relation to the talus. 3. Small avulsion fracture fragments between the tip of the medial malleolus and the adjacent talus. 4. Soft tissue swelling of the lower extremity and foot. 5. No additional acute fracture or dislocation of the left knee, left foot, or left tibia and fibula. Electronically Signed   By: Greig Pique M.D.   On: 08/15/2024 17:01   DG Foot 2 Views Left Result Date: 08/15/2024 CLINICAL DATA:  Pain. EXAM: LEFT FOOT - 2 VIEW; LEFT KNEE - COMPLETE 4+ VIEW; LEFT TIBIA AND FIBULA - 2 VIEW; LEFT ANKLE - 2 VIEW COMPARISON:  Left knee x-ray 01/15/2022 FINDINGS: Left knee: There is no evidence of fracture or dislocation. Joint spaces are well maintained. There is mild tricompartmental osteophyte formation. Soft tissues are unremarkable. Left ankle: There is an acute oblique fracture through the distal fibula at the level of the ankle mortise. The distal fracture fragment is distracted 5 mm laterally. There is abnormal widening of the medial talotibial joint space with subluxation of the distal tibia medially 5 mm. Small avulsion fracture fragments are seen between the tip of the medial malleolus in the adjacent talus. There is soft tissue swelling surrounding the ankle. Left foot: There is soft tissue swelling of the dorsal foot. There is no additional fracture or dislocation. There is mild hallux valgus. Rounded sclerotic density in the calcaneus measures 6 mm. Left tibia and fibula: There is soft tissue swelling of the lower extremity. There is no foreign body. There is no acute fracture or dislocation. No focal osseous lesion identified. Joint spaces are maintained. IMPRESSION: 1. Acute fracture through the distal fibula. 2. Medial subluxation of the distal tibia in relation to the talus. 3. Small avulsion  fracture fragments between the tip of the medial malleolus and the adjacent talus. 4. Soft tissue swelling of the lower extremity and foot. 5. No additional acute fracture or dislocation of the left knee, left foot, or left tibia and fibula. Electronically Signed   By: Greig Pique M.D.   On: 08/15/2024 17:01   DG Ankle 2 Views Left Result Date:  08/15/2024 CLINICAL DATA:  Pain. EXAM: LEFT FOOT - 2 VIEW; LEFT KNEE - COMPLETE 4+ VIEW; LEFT TIBIA AND FIBULA - 2 VIEW; LEFT ANKLE - 2 VIEW COMPARISON:  Left knee x-ray 01/15/2022 FINDINGS: Left knee: There is no evidence of fracture or dislocation. Joint spaces are well maintained. There is mild tricompartmental osteophyte formation. Soft tissues are unremarkable. Left ankle: There is an acute oblique fracture through the distal fibula at the level of the ankle mortise. The distal fracture fragment is distracted 5 mm laterally. There is abnormal widening of the medial talotibial joint space with subluxation of the distal tibia medially 5 mm. Small avulsion fracture fragments are seen between the tip of the medial malleolus in the adjacent talus. There is soft tissue swelling surrounding the ankle. Left foot: There is soft tissue swelling of the dorsal foot. There is no additional fracture or dislocation. There is mild hallux valgus. Rounded sclerotic density in the calcaneus measures 6 mm. Left tibia and fibula: There is soft tissue swelling of the lower extremity. There is no foreign body. There is no acute fracture or dislocation. No focal osseous lesion identified. Joint spaces are maintained. IMPRESSION: 1. Acute fracture through the distal fibula. 2. Medial subluxation of the distal tibia in relation to the talus. 3. Small avulsion fracture fragments between the tip of the medial malleolus and the adjacent talus. 4. Soft tissue swelling of the lower extremity and foot. 5. No additional acute fracture or dislocation of the left knee, left foot, or left tibia and  fibula. Electronically Signed   By: Greig Pique M.D.   On: 08/15/2024 17:01   Scheduled Meds:  acetaminophen   1,000 mg Oral TID   atorvastatin   20 mg Oral Daily   bisacodyl   5 mg Oral Daily   cyanocobalamin   1,000 mcg Oral Daily   donepezil   10 mg Oral QHS   feeding supplement  237 mL Oral BID BM   levothyroxine   75 mcg Oral Q0600   memantine   10 mg Oral BID   mirtazapine   15 mg Oral QHS   Continuous Infusions:  sodium chloride  100 mL/hr at 08/17/24 0459     LOS: 2 days    Almarie KANDICE Hoots, MD 08/17/2024, 8:00 AM

## 2024-08-17 NOTE — Plan of Care (Signed)

## 2024-08-17 NOTE — Evaluation (Signed)
 Physical Therapy Evaluation Patient Details Name: Debbie Brandt MRN: [id number] DOB: 01/17/1961 Today's Date: 08/17/2024  History of Present Illness  63 yo female presents to therapy following hospital admission on 08/15/2024 due to L foot pain for 4 days prior and found to have acute distal fibula and medial subluxation of distal tibia in relation to the talus with soft tissue edema unclear of MOI at this time. Pt underwent L ORIF of L distal fibula and ankle syndesmosis on 12/1. PT is currently L distal LE NWB and L LE in soft cast/splint. Pt also found to have AKI. Pt PMH includes but is not limited to: dementia, HTN, HLD, hypothyroidism, SBO, GERD, and depression.  Clinical Impression  POD 1 s/p L ankle ORIF.   Pt admitted with above diagnosis.  Pt currently with functional limitations due to the deficits listed below (see PT Problem List). Pt in bed when PT returned, no apparent pain response with functional mobiltity tasks, pt is oriented to self only and limited verbal communication. Pt required CGA for supine to sit with use of hospital bed features, siting balance EOB with CGA to close S and min cues, transfer tasks with mod A for balance and stability no AD and total A to maintain L LE NWB, pt exhibiting absent recall for L LE WB restrictions with multimodal and frequent cues for PT and brother. Pt left in bed, all needs in place, brother present. Nursing staff aware of need for lift pad for safety with transfer tasks. Patient will benefit from continued inpatient follow up therapy, <3 hours/day. Pt will benefit from acute skilled PT to increase their independence and safety with mobility to allow discharge.         If plan is discharge home, recommend the following: Two people to help with walking and/or transfers;A lot of help with bathing/dressing/bathroom;Assistance with cooking/housework;Assistance with feeding;Direct supervision/assist for medications management;Direct  supervision/assist for financial management;Assist for transportation;Help with stairs or ramp for entrance;Supervision due to cognitive status   Can travel by private vehicle   No    Equipment Recommendations Other (comment) (TBD in next venue)  Recommendations for Other Services       Functional Status Assessment Patient has had a recent decline in their functional status and demonstrates the ability to make significant improvements in function in a reasonable and predictable amount of time.     Precautions / Restrictions Precautions Precautions: Fall Recall of Precautions/Restrictions: Impaired Restrictions Weight Bearing Restrictions Per Provider Order: Yes LLE Weight Bearing Per Provider Order: Non weight bearing      Mobility  Bed Mobility Overal bed mobility: Needs Assistance Bed Mobility: Supine to Sit, Sit to Supine     Supine to sit: Contact guard, HOB elevated Sit to supine: Contact guard assist (bed flat)   General bed mobility comments: min cues, increased time    Transfers Overall transfer level: Needs assistance Equipment used: None Transfers: Bed to chair/wheelchair/BSC       Squat pivot transfers: Mod assist, +2 physical assistance, +2 safety/equipment, From elevated surface     General transfer comment: PT initally attempted to instruct pt on lateral scoot transfer and pt unable to demonstrate abiltiy for motor processing and planing as well as deficits with cues, pt making attempts for sit to stand from EOB, with absent recall for L LE NWB regardless of multimodal cues, pt able to stand with physical assist PT lifting and maintaining L LE with hip and knee flexion and pivot on R LE to  recliner, however concern per pt and staff safety with return transfer to bed and need  for sky lift or total lift pad  not avalible at time of eval and pt assisted back to bed with transfer as above to the R side. Brother supportive, present and assisting with cues for  safety and L LE NWB    Ambulation/Gait               General Gait Details: NT  Stairs            Wheelchair Mobility     Tilt Bed    Modified Rankin (Stroke Patients Only)       Balance Overall balance assessment: Needs assistance Sitting-balance support: Feet supported Sitting balance-Leahy Scale: Good     Standing balance support: Bilateral upper extremity supported, During functional activity Standing balance-Leahy Scale: Poor Standing balance comment: R single limb stance during transfer tasks with min to mod A for balance and total A to maintain L LE NWB                             Pertinent Vitals/Pain Pain Assessment Pain Assessment: Faces Faces Pain Scale: Hurts a little bit Pain Location: L LE Pain Descriptors / Indicators: Constant, Heaviness Pain Intervention(s): Limited activity within patient's tolerance, Monitored during session, Premedicated before session, Repositioned    Home Living Family/patient expects to be discharged to:: Private residence Living Arrangements: Other relatives Available Help at Discharge: Family Type of Home: House Home Access: Stairs to enter Entrance Stairs-Rails: Doctor, General Practice of Steps: 8 Alternate Level Stairs-Number of Steps: flight Home Layout: Laundry or work area in basement;Able to live on main level with bedroom/bathroom;Two level Home Equipment: None      Prior Function Prior Level of Function : Needs assist  Cognitive Assist : Mobility (cognitive);ADLs (cognitive) Mobility (Cognitive): Step by step cues ADLs (Cognitive): Step by step cues Physical Assist : ADLs (physical)   ADLs (physical): Feeding;Grooming;Bathing;Dressing;Toileting;IADLs Mobility Comments: pt required extensive assist for BADLs including feeding, pt however was able per brother's report to ambulate no AD and to perofrm transfer tasks. ADLs Comments: Brother reports living with pt for the past 6  yrs, pt has become progressively dependent on brother and limited verbal communication.     Extremity/Trunk Assessment        Lower Extremity Assessment Lower Extremity Assessment: Difficult to assess due to impaired cognition (L distal LE NT)    Cervical / Trunk Assessment Cervical / Trunk Assessment: Normal  Communication   Communication Communication: Impaired Factors Affecting Communication: Other (comment);Difficulty expressing self (limited verbal communication)    Cognition Arousal: Alert Behavior During Therapy: Flat affect   PT - Cognitive impairments: History of cognitive impairments                       PT - Cognition Comments: pt is oriented to first name, pt response and follow commands better from brother than from PT at time of eval Following commands: Impaired Following commands impaired: Follows one step commands inconsistently, Follows one step commands with increased time     Cueing Cueing Techniques: Verbal cues, Gestural cues, Tactile cues, Visual cues     General Comments General comments (skin integrity, edema, etc.): noted L LE edema    Exercises     Assessment/Plan    PT Assessment Patient needs continued PT services  PT Problem List Decreased strength;Decreased range of motion;Decreased activity  tolerance;Decreased balance;Decreased mobility;Decreased coordination;Decreased cognition;Decreased knowledge of use of DME;Decreased safety awareness;Decreased knowledge of precautions;Pain       PT Treatment Interventions DME instruction;Gait training;Functional mobility training;Therapeutic activities;Therapeutic exercise;Balance training;Neuromuscular re-education;Cognitive remediation;Patient/family education    PT Goals (Current goals can be found in the Care Plan section)       Frequency Min 2X/week     Co-evaluation               AM-PAC PT 6 Clicks Mobility  Outcome Measure Help needed turning from your back to your  side while in a flat bed without using bedrails?: None Help needed moving from lying on your back to sitting on the side of a flat bed without using bedrails?: None Help needed moving to and from a bed to a chair (including a wheelchair)?: A Little Help needed standing up from a chair using your arms (e.g., wheelchair or bedside chair)?: A Little Help needed to walk in hospital room?: Total Help needed climbing 3-5 steps with a railing? : Total 6 Click Score: 16    End of Session Equipment Utilized During Treatment: Gait belt Activity Tolerance: Patient tolerated treatment well Patient left: in bed;with call bell/phone within reach;with bed alarm set;with family/visitor present Nurse Communication: Mobility status;Need for lift equipment;Weight bearing status PT Visit Diagnosis: Unsteadiness on feet (R26.81);Other abnormalities of gait and mobility (R26.89);Muscle weakness (generalized) (M62.81);Pain;Difficulty in walking, not elsewhere classified (R26.2) Pain - Right/Left: Left Pain - part of body: Leg;Ankle and joints of foot    Time: 1704-1730 PT Time Calculation (min) (ACUTE ONLY): 26 min   Charges:   PT Evaluation $PT Eval Low Complexity: 1 Low PT Treatments $Therapeutic Activity: 8-22 mins PT General Charges $$ ACUTE PT VISIT: 1 Visit         [place], PT Acute Rehab   [place] VEAR Drone 08/17/2024, 7:15 PM

## 2024-08-18 ENCOUNTER — Encounter (HOSPITAL_COMMUNITY): Payer: Self-pay | Admitting: Sports Medicine

## 2024-08-18 DIAGNOSIS — S82892P Other fracture of left lower leg, subsequent encounter for closed fracture with malunion: Secondary | ICD-10-CM | POA: Diagnosis not present

## 2024-08-18 DIAGNOSIS — G309 Alzheimer's disease, unspecified: Secondary | ICD-10-CM | POA: Diagnosis not present

## 2024-08-18 DIAGNOSIS — E785 Hyperlipidemia, unspecified: Secondary | ICD-10-CM

## 2024-08-18 DIAGNOSIS — N179 Acute kidney failure, unspecified: Secondary | ICD-10-CM | POA: Diagnosis not present

## 2024-08-18 DIAGNOSIS — D62 Acute posthemorrhagic anemia: Secondary | ICD-10-CM | POA: Diagnosis not present

## 2024-08-18 DIAGNOSIS — D638 Anemia in other chronic diseases classified elsewhere: Secondary | ICD-10-CM

## 2024-08-18 LAB — CBC WITH DIFFERENTIAL/PLATELET
Abs Immature Granulocytes: 0.06 K/uL (ref 0.00–0.07)
Basophils Absolute: 0 K/uL (ref 0.0–0.1)
Basophils Relative: 0 %
Eosinophils Absolute: 0.1 K/uL (ref 0.0–0.5)
Eosinophils Relative: 1 %
HCT: 33.6 % — ABNORMAL LOW (ref 36.0–46.0)
Hemoglobin: 11 g/dL — ABNORMAL LOW (ref 12.0–15.0)
Immature Granulocytes: 1 %
Lymphocytes Relative: 42 %
Lymphs Abs: 4.2 K/uL — ABNORMAL HIGH (ref 0.7–4.0)
MCH: 28.1 pg (ref 26.0–34.0)
MCHC: 32.7 g/dL (ref 30.0–36.0)
MCV: 85.7 fL (ref 80.0–100.0)
Monocytes Absolute: 0.6 K/uL (ref 0.1–1.0)
Monocytes Relative: 6 %
Neutro Abs: 5 K/uL (ref 1.7–7.7)
Neutrophils Relative %: 50 %
Platelets: 427 K/uL — ABNORMAL HIGH (ref 150–400)
RBC: 3.92 MIL/uL (ref 3.87–5.11)
RDW: 15.2 % (ref 11.5–15.5)
WBC: 9.9 K/uL (ref 4.0–10.5)
nRBC: 0 % (ref 0.0–0.2)

## 2024-08-18 LAB — BASIC METABOLIC PANEL WITH GFR
Anion gap: 9 (ref 5–15)
BUN: 32 mg/dL — ABNORMAL HIGH (ref 8–23)
CO2: 24 mmol/L (ref 22–32)
Calcium: 8.1 mg/dL — ABNORMAL LOW (ref 8.9–10.3)
Chloride: 109 mmol/L (ref 98–111)
Creatinine, Ser: 2.16 mg/dL — ABNORMAL HIGH (ref 0.44–1.00)
GFR, Estimated: 25 mL/min — ABNORMAL LOW (ref >60)
Glucose, Bld: 103 mg/dL — ABNORMAL HIGH (ref 70–99)
Potassium: 3.9 mmol/L (ref 3.5–5.1)
Sodium: 142 mmol/L (ref 135–145)

## 2024-08-18 LAB — PREPARE RBC (CROSSMATCH)

## 2024-08-18 LAB — CBC
HCT: 22.4 % — ABNORMAL LOW (ref 36.0–46.0)
Hemoglobin: 7 g/dL — ABNORMAL LOW (ref 12.0–15.0)
MCH: 28.1 pg (ref 26.0–34.0)
MCHC: 31.3 g/dL (ref 30.0–36.0)
MCV: 90 fL (ref 80.0–100.0)
Platelets: 392 K/uL (ref 150–400)
RBC: 2.49 MIL/uL — ABNORMAL LOW (ref 3.87–5.11)
RDW: 13.7 % (ref 11.5–15.5)
WBC: 8 K/uL (ref 4.0–10.5)
nRBC: 0 % (ref 0.0–0.2)

## 2024-08-18 MED ORDER — SODIUM CHLORIDE 0.9% IV SOLUTION: Freq: Once | INTRAVENOUS | Status: AC

## 2024-08-18 MED ORDER — HYDRALAZINE HCL 20 MG/ML IJ SOLN: 5.0000 mg | Freq: Four times a day (QID) | INTRAMUSCULAR | Status: DC | PRN

## 2024-08-18 MED ORDER — PANTOPRAZOLE SODIUM 40 MG PO TBEC
40.0000 mg | DELAYED_RELEASE_TABLET | Freq: Every day | ORAL | Status: DC
  Administered 2024-08-18 – 2024-08-22 (×5): 40 mg via ORAL
  Filled 2024-08-18 (×5): qty 1

## 2024-08-18 NOTE — TOC Progression Note (Signed)
 Transition of Care [facility]) - Progression Note    Patient Details  Name: Debbie Brandt MRN: [id number] Date of Birth: 02-16-1961  [facility]) CM/SW Contact  NORMAN ASPEN, LCSW Phone Number: 08/18/2024, 10:28 AM  Clinical Narrative:     Have spoken with pt's brother, Lynwood Law, regarding PT recommendation for SNF rehab.  He is aware and in agreement with this plan.  Will begin SNF work up.  Expected Discharge Plan: Home/Self Care Barriers to Discharge: Continued Medical Work up               Expected Discharge Plan and Services In-house Referral: Clinical Social Work Discharge Planning Services: NA Post Acute Care Choice: NA Living arrangements for the past 2 months: Single Family Home                 DME Arranged: N/A DME Agency: NA       HH Arranged: NA HH Agency: NA         Social Drivers of Health (SDOH) Interventions SDOH Screenings   Food Insecurity: No Food Insecurity (08/16/2024)  Housing: Low Risk  (08/16/2024)  Transportation Needs: No Transportation Needs (08/16/2024)  Utilities: Not At Risk (08/16/2024)  Depression (PHQ2-9): Low Risk  (08/03/2024)  Physical Activity: Inactive (08/03/2024)  Social Connections: Unknown (08/16/2024)  Recent Concern: Social Connections - Socially Isolated (08/03/2024)  Stress: No Stress Concern Present (08/03/2024)  Tobacco Use: Low Risk  (08/15/2024)  Health Literacy: Adequate Health Literacy (08/03/2024)    Readmission Risk Interventions    08/16/2024    3:57 PM  Readmission Risk Prevention Plan  Transportation Screening Complete  PCP or Specialist Appt within 5-7 Days Complete  Home Care Screening Complete  Medication Review (RN CM) Complete

## 2024-08-18 NOTE — Plan of Care (Signed)
  Problem: Clinical Measurements: Goal: Ability to maintain clinical measurements within normal limits will improve Outcome: Progressing   Problem: Clinical Measurements: Goal: Will remain free from infection Outcome: Progressing   Problem: Elimination: Goal: Will not experience complications related to urinary retention Outcome: Progressing

## 2024-08-18 NOTE — Progress Notes (Signed)
 30 Day PASRR Note   Patient Details  Name: Debbie Brandt Date of Birth: 09/22/1960   Transition of Care [facility]) CM/SW Contact:    NORMAN ASPEN, LCSW Phone Number: 08/18/2024, 1:16 PM  To Whom It May Concern:  Please be advised that this patient will require a short-term nursing home stay - anticipated 30 days or less for rehabilitation and strengthening.   The plan is for return home.

## 2024-08-18 NOTE — NC FL2 (Signed)
 [place]  MEDICAID FL2 LEVEL OF CARE FORM     IDENTIFICATION  Patient Name: Debbie Brandt Birthdate: [birth year] Sex: female Admission Date (Current Location): 08/15/2024  [facility] and [place] Number:  Producer, Television/film/video and Address:  [facility],  [address], [place] [address]      Provider Number: [id number]  Attending Physician Name and Address:  Sebastian Toribio GAILS, MD  Relative Name and Phone Number:  brother, Lynwood Law @ [phone]    Current Level of Care: Hospital Recommended Level of Care: Skilled Nursing Facility Prior Approval Number:    Date Approved/Denied:   PASRR Number:    Discharge Plan: SNF    Current Diagnoses: Patient Active Problem List   Diagnosis Date Noted   AKI (acute kidney injury) 08/15/2024   Closed fracture of left ankle 08/15/2024   Severe Alzheimer's dementia (HCC) 08/15/2024   Insomnia 08/15/2024   Normocytic anemia 08/05/2024   Inadequate oral intake 08/05/2024   Hyperlipidemia 08/04/2024   Goals of care, counseling/discussion 08/04/2024   Hypomagnesemia 08/04/2024   Acute kidney injury 08/03/2024   Gastroesophageal reflux disease 03/18/2024   Cognitive impairment 06/27/2022   Poor appetite 06/27/2022   Anxiety 06/27/2022   Visual hallucinations 06/27/2022   Rhinorrhea 06/27/2022   Acute pain of left knee 01/15/2022   Memory loss 01/15/2022   Intermittent palpitations 11/21/2017   Obesity (BMI 30-39.9) 11/21/2017   Small bowel obstruction due to adhesions (HCC) 10/28/2016   Small bowel obstruction s/p ex lap & lysis of adhesions 10/28/2016 10/23/2016   Abdominal mass 10/23/2016   Hypokalemia 10/23/2016   Murmur 09/26/2015   Chest pain 09/26/2015   HTN (hypertension) 09/26/2015   Hypothyroidism 09/26/2015    Orientation RESPIRATION BLADDER Height & Weight        Normal Incontinent, External catheter (currently with purewick) Weight: 152 lb 5.4 oz (69.1 kg) Height:  5' 4 (162.6 cm)  BEHAVIORAL  SYMPTOMS/MOOD NEUROLOGICAL BOWEL NUTRITION STATUS      Incontinent Diet (regular, thin liquids)  AMBULATORY STATUS COMMUNICATION OF NEEDS Skin   Extensive Assist Verbally Other (Comment) (surgical incision only)                       Personal Care Assistance Level of Assistance  Bathing, Dressing, Feeding Bathing Assistance: Limited assistance Feeding assistance: Limited assistance Dressing Assistance: Limited assistance     Functional Limitations Info  Sight, Hearing, Speech Sight Info: Adequate Hearing Info: Adequate Speech Info: Impaired    SPECIAL CARE FACTORS FREQUENCY  PT (By licensed PT), OT (By licensed OT)     PT Frequency: 5x/wk OT Frequency: 5x/wk            Contractures Contractures Info: Not present    Additional Factors Info  Code Status Code Status Info: Full             Current Medications (08/18/2024):  This is the current hospital active medication list Current Facility-Administered Medications  Medication Dose Route Frequency Provider Last Rate Last Admin   acetaminophen  (TYLENOL ) tablet 1,000 mg  1,000 mg Oral TID Germaine Redbird, MD   1,000 mg at 08/18/24 1256   aspirin EC tablet 81 mg  81 mg Oral BID Will Almarie MATSU, MD   81 mg at 08/18/24 1257   atorvastatin  (LIPITOR) tablet 20 mg  20 mg Oral Daily Germaine Redbird, MD   20 mg at 08/18/24 1256   bisacodyl  (DULCOLAX) EC tablet 5 mg  5 mg Oral Daily Germaine Redbird,  MD   5 mg at 08/18/24 1257   cyanocobalamin  (VITAMIN B12) tablet 1,000 mcg  1,000 mcg Oral Daily Germaine Redbird, MD   1,000 mcg at 08/18/24 1257   donepezil  (ARICEPT ) tablet 10 mg  10 mg Oral QHS Germaine Redbird, MD   10 mg at 08/17/24 2157   feeding supplement (ENSURE PLUS HIGH PROTEIN) liquid 237 mL  237 mL Oral BID BM Germaine Redbird, MD   237 mL at 08/18/24 1100   hydrALAZINE  (APRESOLINE ) injection 5 mg  5 mg Intravenous Q6H PRN Sebastian Toribio GAILS, MD       HYDROmorphone  (DILAUDID ) injection 0.5 mg  0.5 mg Intravenous Q6H PRN  Germaine Redbird, MD       levothyroxine  (SYNTHROID ) tablet 75 mcg  75 mcg Oral Q0600 Germaine Redbird, MD   75 mcg at 08/18/24 0545   memantine  (NAMENDA ) tablet 10 mg  10 mg Oral BID Germaine Redbird, MD   10 mg at 08/18/24 1257   mirtazapine  (REMERON  SOL-TAB) disintegrating tablet 15 mg  15 mg Oral QHS Germaine Redbird, MD   15 mg at 08/17/24 2156   ondansetron  (ZOFRAN ) tablet 4 mg  4 mg Oral Q6H PRN Germaine Redbird, MD       Or   ondansetron  (ZOFRAN ) injection 4 mg  4 mg Intravenous Q6H PRN Germaine Redbird, MD       oxyCODONE  (Oxy IR/ROXICODONE ) immediate release tablet 5 mg  5 mg Oral Q6H PRN Germaine Redbird, MD   5 mg at 08/17/24 1554   pantoprazole  (PROTONIX ) EC tablet 40 mg  40 mg Oral Q0600 Sebastian Toribio GAILS, MD   40 mg at 08/18/24 1257   QUEtiapine (SEROQUEL) tablet 12.5 mg  12.5 mg Oral QHS Mathews, Elizabeth G, MD   12.5 mg at 08/17/24 2157   senna-docusate (Senokot-S) tablet 1 tablet  1 tablet Oral QHS PRN Germaine Redbird, MD         Discharge Medications: Please see discharge summary for a list of discharge medications.  Relevant Imaging Results:  Relevant Lab Results:   Additional Information SS# [id number]  NORMAN ASPEN, LCSW

## 2024-08-18 NOTE — Progress Notes (Signed)
 PROGRESS NOTE    Debbie Brandt  FMW:[id number] DOB: [birth year] DOA: 08/15/2024 PCP: Lendia Boby LITTIE, NP-C    Chief Complaint  Patient presents with   Foot Pain    Brief Narrative:  Patient lives at home with her brother and is dependent on him for all ADLs due to dementia. This is a 63 year old female with history of dementia hypertension hyperlipidemia hypothyroidism SBO depression GERD came to the ER for complaints of left foot pain and is found to have acute fracture through the distal fibula and medial subluxation of the distal tibia in relation to the talus with soft tissue swelling of the left lower extremity and foot. Denies falls or trauma. At baseline she ambulates without an assistive device.    Assessment & Plan:   Principal Problem:   AKI (acute kidney injury) Active Problems:   Essential hypertension   Closed fracture of left ankle   Severe Alzheimer's dementia (HCC)   Insomnia   Acute postoperative anemia due to expected blood loss   Anemia of chronic disease  #1 acute distal fibular fracture/left foot fracture -Status post ORIF on 08/16/2024 per orthopedics. - Per orthopedics patient NWB to left lower extremity, keep splint clean and dry at all times, elevate, aspirin 81 mg twice daily x 4 weeks for DVT prophylaxis. - Patient with history of dementia at baseline walks around without any assistive device. - Continue current pain control with Tylenol  and oxycodone . - Concern patient may have been orthostatic causing her fall as patient on presentation has soft blood pressure noted to be on triamterene  HCTZ and Cozaar  at home. - Patient seen by PT/OT recommending SNF placement.  2.  AKI -Baseline creatinine approximately 0.9-1.1. - Patient on admission noted to have a creatinine of 3.18. - Likely secondary to a prerenal azotemia in the setting of HCTZ and Cozaar . -Creatinine currently at 2.16 today from 3.18 on admission. -Creatinine noted at 1.87 on day of  discharge, 08/05/2024, during last hospitalization. - Improvement with hydration. - Continue to hold HCTZ. - Discontinue Cozaar  and likely will not resume on discharge. - Follow-up.  3.  Postop acute blood loss anemia/anemia -Patient noted with a hemoglobin of 7.0 this morning from 9.6 on admission. - Anemia panel with iron level of 25, TIBC of 161, ferritin of 576. - Transfused 2 units PRBCs. - Follow H&H.  4.  Hypertension -Continue to hold triamterene  HCTZ. - Hold Cozaar  due to AKI. - If BP control is needed may consider starting patient on Norvasc. - Start hydralazine  as needed.  5.  Hyperlipidemia -Statin.  6.  Hypothyroidism -Synthroid .  7.  Dementia -Patient noted to be mostly nonverbal. - Continue Aricept , Namenda . - Patient noted to have sundowning every evening as patient tries to climb out of bed and ripping out IVs and as such patient started on Seroquel 12.5 mg nightly.   DVT prophylaxis: Aspirin 81 mg twice daily. Code Status: Full Family Communication: No family at bedside. Disposition: SNF when medically stable.  Status is: Inpatient Remains inpatient appropriate because: Severity of illness   Consultants:  Orthopedics: Dr. Germaine 08/16/2024  Procedures:  Plan films of the left ankle 08/16/2024 ORIF left distal fibular fracture/ORIF of left ankle syndesmosis 08/16/2024 per Dr. Germaine Transfused 2 units PRBCs 08/18/2024  Antimicrobials:  Anti-infectives (From admission, onward)    Start     Dose/Rate Route Frequency Ordered Stop   08/16/24 2200  ceFAZolin  (ANCEF ) IVPB 2g/100 mL premix        2 g 200  mL/hr over 30 Minutes Intravenous Every 8 hours 08/16/24 1658 08/17/24 0822   08/16/24 1516  vancomycin (VANCOCIN) powder  Status:  Discontinued          As needed 08/16/24 1516 08/16/24 1557   08/16/24 0600  ceFAZolin  (ANCEF ) IVPB 2g/100 mL premix        2 g 200 mL/hr over 30 Minutes Intravenous On call to O.R. 08/16/24 0514 08/16/24 1415          Subjective: Patient nonverbal.  Sleeping.  Receiving transfusion of PRBCs.  Sitter at bedside.  Per sitter patient has been more sleepy this morning.  Objective: Vitals:   08/18/24 1017 08/18/24 1328 08/18/24 1431 08/18/24 1450  BP: (!) 119/54 114/60 (!) 124/58 122/60  Pulse: 65 79 66 72  Resp:  18  18  Temp: 98.3 F (36.8 C) (!) 97.5 F (36.4 C) 97.9 F (36.6 C) 98.5 F (36.9 C)  TempSrc:  Oral Oral   SpO2: 100% 100% 100%   Weight:      Height:        Intake/Output Summary (Last 24 hours) at 08/18/2024 1506 Last data filed at 08/18/2024 1312 Gross per 24 hour  Intake 398 ml  Output --  Net 398 ml   Filed Weights   08/15/24 1532 08/15/24 2025  Weight: 70.8 kg 69.1 kg    Examination:  General exam: Appears calm and comfortable  Respiratory system: Clear to auscultation anterior lung fields.  No wheezes, no crackles, no rhonchi.  Fair air movement. Respiratory effort normal. Cardiovascular system: S1 & S2 heard, RRR. No JVD, murmurs, rubs, gallops or clicks. No pedal edema. Gastrointestinal system: Abdomen is nondistended, soft and nontender. No organomegaly or masses felt. Normal bowel sounds heard. Central nervous system: Alert and oriented. No focal neurological deficits. Extremities: Left lower extremity in postop bandage/splint.  Skin: No rashes, lesions or ulcers Psychiatry: Judgement and insight unable to assess. Mood & affect appropriate.     Data Reviewed: I have personally reviewed following labs and imaging studies  CBC: Recent Labs  Lab 08/15/24 1622 08/16/24 0431 08/17/24 0823 08/18/24 0458  WBC 10.4 6.7 9.5 8.0  NEUTROABS 7.4  --   --   --   HGB 9.6* 7.3* 7.3* 7.0*  HCT 30.2* 23.8* 23.4* 22.4*  MCV 89.3 91.9 90.3 90.0  PLT 428* 344 376 392    Basic Metabolic Panel: Recent Labs  Lab 08/15/24 1622 08/16/24 0431 08/17/24 0823 08/18/24 0458  NA 140 138 142 142  K 4.5 3.8 4.3 3.9  CL 102 104 109 109  CO2 24 23 23 24   GLUCOSE 100* 90  129* 103*  BUN 35* 33* 30* 32*  CREATININE 3.18* 2.68* 2.14* 2.16*  CALCIUM  9.4 8.7* 8.7* 8.1*  MG 1.9  --   --   --     GFR: Estimated Creatinine Clearance: 24.8 mL/min (A) (by C-G formula based on SCr of 2.16 mg/dL (H)).  Liver Function Tests: Recent Labs  Lab 08/15/24 1622  AST 37  ALT 28  ALKPHOS 76  BILITOT 0.6  PROT 7.6  ALBUMIN  3.8    CBG: Recent Labs  Lab 08/16/24 0429  GLUCAP 100*     Recent Results (from the past 240 hours)  Surgical pcr screen     Status: None   Collection Time: 08/16/24 10:37 AM   Specimen: Nasal Mucosa; Nasal Swab  Result Value Ref Range Status   MRSA, PCR NEGATIVE NEGATIVE Final   Staphylococcus aureus NEGATIVE NEGATIVE Final  Comment: (NOTE) The Xpert SA Assay (FDA approved for NASAL specimens in patients 36 years of age and older), is one component of a comprehensive surveillance program. It is not intended to diagnose infection nor to guide or monitor treatment. Performed at [facility], [address], [place], [place] [address]          Radiology Studies: DG Ankle 2 Views Left Result Date: 08/16/2024 CLINICAL DATA:  Elective surgery. EXAM: LEFT ANKLE - 2 VIEW COMPARISON:  Preoperative imaging FINDINGS: Seventeen fluoroscopic spot views of the ankle submitted from the operating room. Lateral plate and multi screw fixation of distal fibular fracture. There are 2 syndesmotic screws. Fluoroscopy time 25 seconds. Dose 0.51 mGy. IMPRESSION: Intraoperative fluoroscopy during ORIF of distal fibular fracture. Electronically Signed   By: Andrea Gasman M.D.   On: 08/16/2024 17:54   DG C-Arm 1-60 Min-No Report Result Date: 08/16/2024 Fluoroscopy was utilized by the requesting physician.  No radiographic interpretation.   DG C-Arm 1-60 Min-No Report Result Date: 08/16/2024 Fluoroscopy was utilized by the requesting physician.  No radiographic interpretation.        Scheduled Meds:  acetaminophen   1,000  mg Oral TID   aspirin EC  81 mg Oral BID   atorvastatin   20 mg Oral Daily   bisacodyl   5 mg Oral Daily   cyanocobalamin   1,000 mcg Oral Daily   donepezil   10 mg Oral QHS   feeding supplement  237 mL Oral BID BM   levothyroxine   75 mcg Oral Q0600   memantine   10 mg Oral BID   mirtazapine   15 mg Oral QHS   pantoprazole   40 mg Oral Q0600   QUEtiapine  12.5 mg Oral QHS   Continuous Infusions:   LOS: 3 days    Time spent: 35 minutes    Toribio Hummer, MD [facility]   To contact the attending provider between 7A-7P or the covering provider during after hours 7P-7A, please log into the web site [email or web address] and access using universal [place] password for that web site. If you do not have the password, please call the hospital operator.  08/18/2024, 3:06 PM

## 2024-08-19 ENCOUNTER — Other Ambulatory Visit: Payer: Self-pay

## 2024-08-19 DIAGNOSIS — N179 Acute kidney failure, unspecified: Secondary | ICD-10-CM | POA: Diagnosis not present

## 2024-08-19 DIAGNOSIS — S82892P Other fracture of left lower leg, subsequent encounter for closed fracture with malunion: Secondary | ICD-10-CM | POA: Diagnosis not present

## 2024-08-19 DIAGNOSIS — G309 Alzheimer's disease, unspecified: Secondary | ICD-10-CM | POA: Diagnosis not present

## 2024-08-19 DIAGNOSIS — D62 Acute posthemorrhagic anemia: Secondary | ICD-10-CM | POA: Diagnosis not present

## 2024-08-19 LAB — CBC
HCT: 32.7 % — ABNORMAL LOW (ref 36.0–46.0)
Hemoglobin: 10.4 g/dL — ABNORMAL LOW (ref 12.0–15.0)
MCH: 28 pg (ref 26.0–34.0)
MCHC: 31.8 g/dL (ref 30.0–36.0)
MCV: 87.9 fL (ref 80.0–100.0)
Platelets: 377 K/uL (ref 150–400)
RBC: 3.72 MIL/uL — ABNORMAL LOW (ref 3.87–5.11)
RDW: 15 % (ref 11.5–15.5)
WBC: 6.1 K/uL (ref 4.0–10.5)
nRBC: 0 % (ref 0.0–0.2)

## 2024-08-19 LAB — BASIC METABOLIC PANEL WITH GFR
Anion gap: 7 (ref 5–15)
BUN: 22 mg/dL (ref 8–23)
CO2: 26 mmol/L (ref 22–32)
Calcium: 8.7 mg/dL — ABNORMAL LOW (ref 8.9–10.3)
Chloride: 108 mmol/L (ref 98–111)
Creatinine, Ser: 1.51 mg/dL — ABNORMAL HIGH (ref 0.44–1.00)
GFR, Estimated: 38 mL/min — ABNORMAL LOW (ref >60)
Glucose, Bld: 90 mg/dL (ref 70–99)
Potassium: 4.2 mmol/L (ref 3.5–5.1)
Sodium: 141 mmol/L (ref 135–145)

## 2024-08-19 LAB — TYPE AND SCREEN
ABO/RH(D): O POS
Antibody Screen: NEGATIVE
Unit division: 0
Unit division: 0

## 2024-08-19 LAB — BPAM RBC
Blood Product Expiration Date: 202512312359
Blood Product Expiration Date: 202512312359
ISSUE DATE / TIME: 202512030938
ISSUE DATE / TIME: 202512031420
Unit Type and Rh: 5100
Unit Type and Rh: 5100

## 2024-08-19 MED ORDER — AMLODIPINE BESYLATE 5 MG PO TABS
5.0000 mg | ORAL_TABLET | Freq: Every day | ORAL | Status: DC
  Administered 2024-08-19 – 2024-08-20 (×2): 5 mg via ORAL
  Filled 2024-08-19 (×2): qty 1

## 2024-08-19 NOTE — Progress Notes (Signed)
 PROGRESS NOTE    Debbie Brandt  FMW:[id number] DOB: 03/09/1961 DOA: 08/15/2024 PCP: Lendia Boby LITTIE, NP-C    Chief Complaint  Patient presents with   Foot Pain    Brief Narrative:  Patient lives at home with her brother and is dependent on him for all ADLs due to dementia. This is a 63 year old female with history of dementia hypertension hyperlipidemia hypothyroidism SBO depression GERD came to the ER for complaints of left foot pain and is found to have acute fracture through the distal fibula and medial subluxation of the distal tibia in relation to the talus with soft tissue swelling of the left lower extremity and foot. Denies falls or trauma. At baseline she ambulates without an assistive device.    Assessment & Plan:   Principal Problem:   AKI (acute kidney injury) Active Problems:   Essential hypertension   Closed fracture of left ankle   Severe Alzheimer's dementia (HCC)   Insomnia   Acute postoperative anemia due to expected blood loss   Anemia of chronic disease  #1 acute distal fibular fracture/left foot fracture -Status post ORIF on 08/16/2024 per orthopedics. - Per orthopedics patient NWB to left lower extremity, keep splint clean and dry at all times, elevate, aspirin 81 mg twice daily x 4 weeks for DVT prophylaxis. - Patient with history of dementia at baseline walks around without any assistive device. - Continue current pain control with Tylenol  and oxycodone . - Concern patient may have been orthostatic causing her fall as patient on presentation has soft blood pressure noted to be on triamterene  HCTZ and Cozaar  at home. - Patient seen by PT/OT recommending SNF placement.  2.  AKI -Baseline creatinine approximately 0.9-1.1. - Patient on admission noted to have a creatinine of 3.18. - Likely secondary to a prerenal azotemia in the setting of HCTZ and Cozaar . -Creatinine currently at 1.51 from 2.16 from 3.18 on admission. -Creatinine noted at 1.87 on day of  discharge, 08/05/2024, during last hospitalization. - Improved with hydration  - Continue to hold HCTZ, Cozaar .   - Follow.  3.  Postop acute blood loss anemia/anemia -Patient noted with a hemoglobin of 7.0 (08/18/2024) from 9.6 on admission. - Anemia panel with iron level of 25, TIBC of 161, ferritin of 576. - Status post transfusion 2 units PRBCs hemoglobin currently at 10.4.  - Follow H&H.  4.  Hypertension -Continue to hold triamterene  HCTZ. - Hold Cozaar  due to AKI and likely will not resume on discharge. - Start Norvasc 5 mg daily.   - Hydralazine  as needed.   5.  Hyperlipidemia - Continue statin.   6.  Hypothyroidism - Synthroid .    7.  Dementia -Patient noted to be mostly nonverbal. - Continue Aricept , Namenda . - Patient noted to have sundowning every evening as patient tries to climb out of bed and ripping out IVs and as such patient started on Seroquel 12.5 mg nightly. - Follow-up   DVT prophylaxis: Aspirin 81 mg twice daily. Code Status: Full Family Communication: No family at bedside. Disposition: SNF when medically stable hopefully in the next 24 hours.  Status is: Inpatient Remains inpatient appropriate because: Severity of illness   Consultants:  Orthopedics: Dr. Germaine 08/16/2024  Procedures:  Plan films of the left ankle 08/16/2024 ORIF left distal fibular fracture/ORIF of left ankle syndesmosis 08/16/2024 per Dr. Germaine Transfuse 2 units PRBCs 08/18/2024  Antimicrobials:  Anti-infectives (From admission, onward)    Start     Dose/Rate Route Frequency Ordered Stop   08/16/24  2200  ceFAZolin  (ANCEF ) IVPB 2g/100 mL premix        2 g 200 mL/hr over 30 Minutes Intravenous Every 8 hours 08/16/24 1658 08/17/24 0822   08/16/24 1516  vancomycin (VANCOCIN) powder  Status:  Discontinued          As needed 08/16/24 1516 08/16/24 1557   08/16/24 0600  ceFAZolin  (ANCEF ) IVPB 2g/100 mL premix        2 g 200 mL/hr over 30 Minutes Intravenous On call to O.R.  08/16/24 0514 08/16/24 1415         Subjective: Patient lying in bed.  Patient awake.  Patient mouthing few answers to questions appropriately.  Minimally verbal.  Denies any chest pain or shortness of breath.  No abdominal pain.  Sitter at bedside.   Objective: Vitals:   08/18/24 1715 08/18/24 2143 08/19/24 0602 08/19/24 0954  BP: 131/80 132/72 (!) 148/63 138/65  Pulse: 75 66 60 79  Resp: 18 17 17    Temp: 98.5 F (36.9 C) 97.7 F (36.5 C)    TempSrc:  Oral    SpO2: 100% 99% 100% 97%  Weight:      Height:        Intake/Output Summary (Last 24 hours) at 08/19/2024 1048 Last data filed at 08/19/2024 0935 Gross per 24 hour  Intake 1008 ml  Output --  Net 1008 ml   Filed Weights   08/15/24 1532 08/15/24 2025  Weight: 70.8 kg 69.1 kg    Examination:  General exam: NAD Respiratory system: CTAB.  No wheezes, no crackles, no rhonchi.  Fair air movement.  Speaking in full sentences.  Cardiovascular system: Regular rate and rhythm no murmurs rubs or gallops.  No JVD.  No lower extremity edema.  Gastrointestinal system: Abdomen is soft, nontender, nondistended, positive bowel sounds.  No rebound.  No guarding.  Central nervous system: Alert and oriented.  Moving extremities spontaneously.  No focal neurological deficits.  Extremities: Left lower extremity in postop bandage/splint.  Skin: No rashes, lesions or ulcers Psychiatry: Judgement and insight unable to assess. Mood & affect appropriate.     Data Reviewed: I have personally reviewed following labs and imaging studies  CBC: Recent Labs  Lab 08/15/24 1622 08/16/24 0431 08/17/24 0823 08/18/24 0458 08/18/24 1908 08/19/24 0524  WBC 10.4 6.7 9.5 8.0 9.9 6.1  NEUTROABS 7.4  --   --   --  5.0  --   HGB 9.6* 7.3* 7.3* 7.0* 11.0* 10.4*  HCT 30.2* 23.8* 23.4* 22.4* 33.6* 32.7*  MCV 89.3 91.9 90.3 90.0 85.7 87.9  PLT 428* 344 376 392 427* 377    Basic Metabolic Panel: Recent Labs  Lab 08/15/24 1622 08/16/24 0431  08/17/24 0823 08/18/24 0458 08/19/24 0524  NA 140 138 142 142 141  K 4.5 3.8 4.3 3.9 4.2  CL 102 104 109 109 108  CO2 24 23 23 24 26   GLUCOSE 100* 90 129* 103* 90  BUN 35* 33* 30* 32* 22  CREATININE 3.18* 2.68* 2.14* 2.16* 1.51*  CALCIUM  9.4 8.7* 8.7* 8.1* 8.7*  MG 1.9  --   --   --   --     GFR: Estimated Creatinine Clearance: 35.5 mL/min (A) (by C-G formula based on SCr of 1.51 mg/dL (H)).  Liver Function Tests: Recent Labs  Lab 08/15/24 1622  AST 37  ALT 28  ALKPHOS 76  BILITOT 0.6  PROT 7.6  ALBUMIN  3.8    CBG: Recent Labs  Lab 08/16/24 0429  GLUCAP  100*     Recent Results (from the past 240 hours)  Surgical pcr screen     Status: None   Collection Time: 08/16/24 10:37 AM   Specimen: Nasal Mucosa; Nasal Swab  Result Value Ref Range Status   MRSA, PCR NEGATIVE NEGATIVE Final   Staphylococcus aureus NEGATIVE NEGATIVE Final    Comment: (NOTE) The Xpert SA Assay (FDA approved for NASAL specimens in patients 89 years of age and older), is one component of a comprehensive surveillance program. It is not intended to diagnose infection nor to guide or monitor treatment. Performed at [facility] [place], [address] [place], [place] [address]          Radiology Studies: No results found.       Scheduled Meds:  acetaminophen   1,000 mg Oral TID   amLODipine  5 mg Oral Daily   aspirin EC  81 mg Oral BID   atorvastatin   20 mg Oral Daily   bisacodyl   5 mg Oral Daily   cyanocobalamin   1,000 mcg Oral Daily   donepezil   10 mg Oral QHS   feeding supplement  237 mL Oral BID BM   levothyroxine   75 mcg Oral Q0600   memantine   10 mg Oral BID   mirtazapine   15 mg Oral QHS   pantoprazole   40 mg Oral Q0600   QUEtiapine  12.5 mg Oral QHS   Continuous Infusions:   LOS: 4 days    Time spent: 35 minutes    Toribio Hummer, MD [facility]   To contact the attending provider between 7A-7P or the covering provider during after  hours 7P-7A, please log into the web site [email or web address] and access using universal  password for that web site. If you do not have the password, please call the hospital operator.  08/19/2024, 10:48 AM

## 2024-08-19 NOTE — Plan of Care (Signed)

## 2024-08-19 NOTE — TOC Progression Note (Addendum)
 Transition of Care [facility]) - Progression Note    Patient Details  Name: Debbie Brandt MRN: [id number] Date of Birth: Jun 12, 1961  [facility]) CM/SW Contact  Doneta Glenys DASEN, RN Phone Number: 08/19/2024, 3:31 PM  Clinical Narrative:    CM presented choice for SNF to patients brother Lyn[place] and [facility] dlt). They choose [facility]. [facility] updated that patient accepted bed offer.  [facility] MUST ID [id number], PASRR # [id number] A. 3:47 PM Insurance shara started in [place] [id number]   Expected Discharge Plan: Home/Self Care Barriers to Discharge: Continued Medical Work up               Expected Discharge Plan and Services In-house Referral: Clinical Social Work Discharge Planning Services: NA Post Acute Care Choice: NA Living arrangements for the past 2 months: Single Family Home                 DME Arranged: N/A DME Agency: NA       HH Arranged: NA HH Agency: NA         Social Drivers of Health (SDOH) Interventions SDOH Screenings   Food Insecurity: No Food Insecurity (08/16/2024)  Housing: Low Risk  (08/16/2024)  Transportation Needs: No Transportation Needs (08/16/2024)  Utilities: Not At Risk (08/16/2024)  Depression (PHQ2-9): Low Risk  (08/03/2024)  Physical Activity: Inactive (08/03/2024)  Social Connections: Unknown (08/16/2024)  Recent Concern: Social Connections - Socially Isolated (08/03/2024)  Stress: No Stress Concern Present (08/03/2024)  Tobacco Use: Low Risk  (08/15/2024)  Health Literacy: Adequate Health Literacy (08/03/2024)    Readmission Risk Interventions    08/16/2024    3:57 PM  Readmission Risk Prevention Plan  Transportation Screening Complete  PCP or Specialist Appt within 5-7 Days Complete  Home Care Screening Complete  Medication Review (RN CM) Complete

## 2024-08-20 DIAGNOSIS — S82892A Other fracture of left lower leg, initial encounter for closed fracture: Secondary | ICD-10-CM | POA: Diagnosis not present

## 2024-08-20 DIAGNOSIS — G309 Alzheimer's disease, unspecified: Secondary | ICD-10-CM | POA: Diagnosis not present

## 2024-08-20 DIAGNOSIS — N179 Acute kidney failure, unspecified: Secondary | ICD-10-CM | POA: Diagnosis not present

## 2024-08-20 DIAGNOSIS — I10 Essential (primary) hypertension: Secondary | ICD-10-CM | POA: Diagnosis not present

## 2024-08-20 LAB — CBC WITH DIFFERENTIAL/PLATELET
Abs Immature Granulocytes: 0.04 K/uL (ref 0.00–0.07)
Basophils Absolute: 0.1 K/uL (ref 0.0–0.1)
Basophils Relative: 1 %
Eosinophils Absolute: 0.2 K/uL (ref 0.0–0.5)
Eosinophils Relative: 3 %
HCT: 31.2 % — ABNORMAL LOW (ref 36.0–46.0)
Hemoglobin: 9.9 g/dL — ABNORMAL LOW (ref 12.0–15.0)
Immature Granulocytes: 1 %
Lymphocytes Relative: 31 %
Lymphs Abs: 2.1 K/uL (ref 0.7–4.0)
MCH: 27.6 pg (ref 26.0–34.0)
MCHC: 31.7 g/dL (ref 30.0–36.0)
MCV: 86.9 fL (ref 80.0–100.0)
Monocytes Absolute: 0.5 K/uL (ref 0.1–1.0)
Monocytes Relative: 7 %
Neutro Abs: 3.9 K/uL (ref 1.7–7.7)
Neutrophils Relative %: 57 %
Platelets: 396 K/uL (ref 150–400)
RBC: 3.59 MIL/uL — ABNORMAL LOW (ref 3.87–5.11)
RDW: 14.3 % (ref 11.5–15.5)
WBC: 6.6 K/uL (ref 4.0–10.5)
nRBC: 0 % (ref 0.0–0.2)

## 2024-08-20 LAB — BASIC METABOLIC PANEL WITH GFR
Anion gap: 9 (ref 5–15)
BUN: 25 mg/dL — ABNORMAL HIGH (ref 8–23)
CO2: 25 mmol/L (ref 22–32)
Calcium: 8.5 mg/dL — ABNORMAL LOW (ref 8.9–10.3)
Chloride: 108 mmol/L (ref 98–111)
Creatinine, Ser: 1.52 mg/dL — ABNORMAL HIGH (ref 0.44–1.00)
GFR, Estimated: 38 mL/min — ABNORMAL LOW (ref >60)
Glucose, Bld: 88 mg/dL (ref 70–99)
Potassium: 4 mmol/L (ref 3.5–5.1)
Sodium: 142 mmol/L (ref 135–145)

## 2024-08-20 MED ORDER — AMLODIPINE BESYLATE 10 MG PO TABS: 10.0000 mg | ORAL_TABLET | Freq: Every day | ORAL | Status: DC

## 2024-08-20 MED ORDER — OXYCODONE HCL 5 MG PO TABS: 5.0000 mg | ORAL_TABLET | Freq: Four times a day (QID) | ORAL | 0 refills | Status: AC | PRN

## 2024-08-20 MED ORDER — SODIUM CHLORIDE 0.9 % IV SOLN: INTRAVENOUS | Status: AC

## 2024-08-20 MED ORDER — ACETAMINOPHEN 500 MG PO TABS: 1000.0000 mg | ORAL_TABLET | Freq: Three times a day (TID) | ORAL | Status: AC

## 2024-08-20 MED ORDER — PANTOPRAZOLE SODIUM 40 MG PO TBEC: 40.0000 mg | DELAYED_RELEASE_TABLET | Freq: Every day | ORAL | Status: AC

## 2024-08-20 MED ORDER — SODIUM CHLORIDE 0.9 % IV BOLUS
250.0000 mL | Freq: Once | INTRAVENOUS | Status: AC
  Administered 2024-08-20: 250 mL via INTRAVENOUS

## 2024-08-20 MED ORDER — QUETIAPINE FUMARATE 25 MG PO TABS: 12.5000 mg | ORAL_TABLET | Freq: Every evening | ORAL | Status: DC | PRN

## 2024-08-20 MED ORDER — ONDANSETRON HCL 4 MG PO TABS: 4.0000 mg | ORAL_TABLET | Freq: Four times a day (QID) | ORAL | 0 refills | Status: AC | PRN

## 2024-08-20 MED ORDER — AMLODIPINE BESYLATE 10 MG PO TABS
10.0000 mg | ORAL_TABLET | Freq: Every day | ORAL | Status: DC
  Administered 2024-08-21 – 2024-08-22 (×2): 10 mg via ORAL
  Filled 2024-08-20 (×2): qty 1

## 2024-08-20 MED ORDER — BISACODYL 5 MG PO TBEC: 5.0000 mg | DELAYED_RELEASE_TABLET | Freq: Every day | ORAL | Status: AC

## 2024-08-20 MED ORDER — ASPIRIN 81 MG PO TBEC: 81.0000 mg | DELAYED_RELEASE_TABLET | Freq: Two times a day (BID) | ORAL | Status: AC

## 2024-08-20 MED ORDER — AMLODIPINE BESYLATE 5 MG PO TABS
5.0000 mg | ORAL_TABLET | Freq: Once | ORAL | Status: AC
  Administered 2024-08-20: 5 mg via ORAL
  Filled 2024-08-20: qty 1

## 2024-08-20 NOTE — TOC Progression Note (Addendum)
 Transition of Care [facility]) - Progression Note    Patient Details  Name: Debbie Brandt MRN: [id number] Date of Birth: [birth year]  [facility] of [facility] CM/SW Contact  Sonda Manuella Quill, RN Phone Number: 08/20/2024, 9:57 AM  Clinical Narrative:    Ins auth still pending; spoke w/ Rep Niels at [facility]; she said documents have been received, and case is under review; agency will call this RN CM w/ decision; awaiting return call; Dr Sebastian notified via secure chat; also notified Brittany at [facility].  -1527- ins auth still pending in [place] ID # [id number]; called [employer] and spoke w/ Rep Penne; he said shara is still under review; awaiting notification; Brittany at [facility] notified; also Dr Sebastian notified via secure chat.  Expected Discharge Plan: Home/Self Care Barriers to Discharge: Continued Medical Work up               Expected Discharge Plan and Services In-house Referral: Clinical Social Work Discharge Planning Services: NA Post Acute Care Choice: NA Living arrangements for the past 2 months: Single Family Home                 DME Arranged: N/A DME Agency: NA       HH Arranged: NA HH Agency: NA         Social Drivers of Health (SDOH) Interventions SDOH Screenings   Food Insecurity: No Food Insecurity (08/16/2024)  Housing: Low Risk  (08/16/2024)  Transportation Needs: No Transportation Needs (08/16/2024)  Utilities: Not At Risk (08/16/2024)  Depression (PHQ2-9): Low Risk  (08/03/2024)  Physical Activity: Inactive (08/03/2024)  Social Connections: Unknown (08/16/2024)  Recent Concern: Social Connections - Socially Isolated (08/03/2024)  Stress: No Stress Concern Present (08/03/2024)  Tobacco Use: Low Risk  (08/15/2024)  Health Literacy: Adequate Health Literacy (08/03/2024)    Readmission Risk Interventions    08/16/2024    3:57 PM  Readmission Risk Prevention Plan  Transportation Screening Complete  PCP or Specialist Appt within 5-7  Days Complete  Home Care Screening Complete  Medication Review (RN CM) Complete

## 2024-08-20 NOTE — Discharge Summary (Signed)
 Physician Discharge Summary  Debbie Brandt FMW:[id number] DOB: Jan 25, 1961 DOA: 08/15/2024  PCP: Debbie Boby LITTIE, NP-C  Admit date: 08/15/2024 Discharge date: 08/20/2024  Time spent: 60 minutes  Recommendations for Outpatient Follow-up:  Follow-up with MD at skilled nursing facility.  Patient will need a basic metabolic profile done in 1 week to follow-up on electrolytes and renal function.  Patient will need a CBC to follow-up on counts. Follow-up with Dr. Germaine, orthopedics in 2 weeks.   Discharge Diagnoses:  Principal Problem:   AKI (acute kidney injury) Active Problems:   Essential hypertension   Closed fracture of left ankle   Severe Alzheimer's dementia (HCC)   Insomnia   Acute postoperative anemia due to expected blood loss   Anemia of chronic disease   Discharge Condition: Stable and improved.  Diet recommendation: Regular  Filed Weights   08/15/24 1532 08/15/24 2025  Weight: 70.8 kg 69.1 kg    History of present illness:  HPI per Dr. Lou Montie Brandt Brandt is a 63 y.o. female with medical history significant for dementia, HTN, HLD, hypothyroidism, SBO, GERD and depression who presented to the ED for evaluation of left foot pain.  Patient is completely dependent on her brother for all her ADLs due to her dementia.  Currently resides with brother who is her POA.  Per brother, patient started reporting some pain with pressure on her left foot 2 days ago.  Patient sleeps in her own bed and ambulates without an assistive device. They have not seen patient fall or trip in the house.  He is unsure when patient sustained the left foot injury.  He also reports that patient has been eating and drinking okay.  At the bedside, patient was/shivering, occasionally mumbles but difficult to make out what she is saying.   ED Course: Initial vitals show patient afebrile, HR 60-70s, BP 107/51, SpO2 100% on room air. Initial labs significant for creatinine 3.18, WBC 10.4, Hgb 9.6,  platelet 428, mag 1.9. EKG shows sinus rhythm.  X-ray tibia/fibula left shows acute fracture through the distal fibular and medial subluxation of the distal tibia in relation to the talus with soft tissue swelling of the lower extremity and foot.  Pt received IV LR 1 L bolus x 2, and IV fentanyl .  Patient was given propofol  and her left ankle was reduced. Orthopedic surgery was consulted for evaluation. TR[facility] was consulted for admission.   Hospital Course:  #1 acute distal fibular fracture/left foot fracture -Status post ORIF on 08/16/2024 per orthopedics. - Per orthopedics patient NWB to left lower extremity, keep splint clean and dry at all times, elevate, aspirin  81 mg twice daily x 4 weeks for DVT prophylaxis. - Patient with history of dementia at baseline walks around without any assistive device. - Patient's pain controlled during hospitalization with scheduled Tylenol  and oxycodone .   - Concern patient may have been orthostasis causing her fall as patient on presentation had soft blood pressure noted to be on triamterene  HCTZ and Cozaar  at home. -Patient seen by PT/OT who recommended SNF placement. -Patient was discharged to SNF. -Outpatient follow-up with orthopedics, Dr. Germaine in 2 to 3 weeks. -Patient will be discharged to SNF.   2.  AKI -Baseline creatinine approximately 0.9-1.1. - Patient on admission noted to have a creatinine of 3.18. - Likely secondary to a prerenal azotemia in the setting of HCTZ and Cozaar . -Creatinine improved during the hospitalization such that by day of discharge creatinine was down to 1.52 from 3.18 on admission. -Creatinine  noted at 1.87 on day of discharge during last hospitalization, 08/05/2024. -Patient was HCTZ and Cozaar  were held during hospitalization and will not be resumed on discharge. -Patient's renal function improved with hydration. -Outpatient follow-up.   3.  Postop acute blood loss anemia/anemia -Patient noted with a hemoglobin of 7.0  (08/18/2024) from 9.6 on admission. - Anemia panel with iron level of 25, TIBC of 161, ferritin of 576. - Status post transfusion 2 units PRBCs hemoglobin stabilized at 9.9 by day of discharge.    4.  Hypertension - Patient's triamterene  HCTZ and Cozaar  were held during hospitalization due to AKI and will not be resumed on discharge. - Patient is on Norvasc  5 mg daily which was uptitrated to 10 mg daily for blood pressure control. - Patient also maintained on hydralazine  as needed. - Outpatient follow-up.   5.  Hyperlipidemia - Patient maintained on home regimen statin.    6.  Hypothyroidism - Patient maintained on home regimen Synthroid .     7.  Dementia -Patient noted to be mostly nonverbal versus minimally verbal. - Patient maintained on home regimen Aricept , Namenda . - Patient noted to have sundowning every evening as patient tries to climb out of bed and ripping out IVs and as such patient started on Seroquel  12.5 mg nightly. -Patient was discharged on Seroquel  on a as needed basis. -Outpatient follow-up.    Procedures: Plain films of the left ankle 08/16/2024 ORIF left distal fibular fracture/ORIF of left ankle syndesmosis 08/16/2024 per Dr. Germaine Transfuse 2 units PRBCs 08/18/2024  Consultations: Orthopedics: Dr. Germaine 08/16/2024   Discharge Exam: Vitals:   08/20/24 0940 08/20/24 1329  BP: (!) 155/88 (!) 158/68  Pulse:  80  Resp:  20  Temp:  98.5 F (36.9 C)  SpO2:  100%    General: NAD Cardiovascular: RRR no murmurs rubs or gallops.  No JVD.  No pitting lower extremity edema. Respiratory: Clear to auscultation bilaterally.  No wheezes, no crackles, no rhonchi.  Fair air movement.  Speaking in full sentences.  Discharge Instructions   Discharge Instructions     Diet general   Complete by: As directed    Increase activity slowly   Complete by: As directed    No wound care   Complete by: As directed       Allergies as of 08/20/2024       Reactions    Voltaren  [diclofenac  Sodium] Other (See Comments)   Unknown, pt went to pick up at pharmacy and they said she was allergic        Medication List     PAUSE taking these medications    losartan  50 MG tablet Wait to take this until your doctor or other care provider tells you to start again. Commonly known as: COZAAR  Take 1 tablet (50 mg total) by mouth daily.   triamterene -hydrochlorothiazide  37.5-25 MG capsule Wait to take this until your doctor or other care provider tells you to start again. Commonly known as: DYAZIDE  TAKE 1 CAPSULE BY MOUTH DAILY       TAKE these medications    acetaminophen  500 MG tablet Commonly known as: TYLENOL  Take 2 tablets (1,000 mg total) by mouth 3 (three) times daily for 10 days.   amLODipine  10 MG tablet Commonly known as: NORVASC  Take 1 tablet (10 mg total) by mouth daily. Start taking on: August 21, 2024   aspirin  EC 81 MG tablet Take 1 tablet (81 mg total) by mouth 2 (two) times daily for 28 days. Swallow whole.  atorvastatin  20 MG tablet Commonly known as: LIPITOR TAKE 1 TABLET BY MOUTH DAILY.   bisacodyl  5 MG EC tablet Commonly known as: DULCOLAX Take 1 tablet (5 mg total) by mouth daily. Start taking on: August 21, 2024   cyanocobalamin  1000 MCG tablet Commonly known as: VITAMIN B12 Take 1,000 mcg by mouth daily.   donepezil  10 MG tablet Commonly known as: ARICEPT  Take 1 tablet (10 mg total) by mouth at bedtime.   feeding supplement Liqd Take 237 mLs by mouth 2 (two) times daily between meals.   levothyroxine  75 MCG tablet Commonly known as: SYNTHROID  TAKE 1 TABLET BY MOUTH EVERY MORNING BEFORE BREAKFAST   memantine  10 MG tablet Commonly known as: NAMENDA  Take 1 tablet (10 mg total) by mouth 2 (two) times daily.   mirtazapine  15 MG disintegrating tablet Commonly known as: REMERON  SOL-TAB DISSOLVE 1 TABLET ON TONGUE EVERY NIGHT AT BEDTIME   ondansetron  4 MG tablet Commonly known as: ZOFRAN  Take 1 tablet  (4 mg total) by mouth every 6 (six) hours as needed for nausea.   oxyCODONE  5 MG immediate release tablet Commonly known as: Oxy IR/ROXICODONE  Take 1 tablet (5 mg total) by mouth every 6 (six) hours as needed for moderate pain (pain score 4-6) or severe pain (pain score 7-10).   pantoprazole  40 MG tablet Commonly known as: PROTONIX  Take 1 tablet (40 mg total) by mouth daily at 6 (six) AM. Start taking on: August 21, 2024   QUEtiapine  25 MG tablet Commonly known as: SEROQUEL  Take 0.5 tablets (12.5 mg total) by mouth at bedtime as needed (Agitation/sundowning).       Allergies  Allergen Reactions   Voltaren  [Diclofenac  Sodium] Other (See Comments)    Unknown, pt went to pick up at pharmacy and they said she was allergic    Contact information for follow-up providers     MD at facility Follow up.          Germaine Redbird, MD. Schedule an appointment as soon as possible for a visit in 2 week(s).   Specialty: Sports Medicine Why: Postop follow-up/wound check Contact information: [address] [place] [address] [phone]              Contact information for after-discharge care     Destination     [facility] .   Service: Skilled Nursing Contact information: [address] [place]  [address] [phone]                      The results of significant diagnostics from this hospitalization (including imaging, microbiology, ancillary and laboratory) are listed below for reference.    Significant Diagnostic Studies: DG Ankle 2 Views Left Result Date: 08/16/2024 CLINICAL DATA:  Elective surgery. EXAM: LEFT ANKLE - 2 VIEW COMPARISON:  Preoperative imaging FINDINGS: Seventeen fluoroscopic spot views of the ankle submitted from the operating room. Lateral plate and multi screw fixation of distal fibular fracture. There are 2 syndesmotic screws. Fluoroscopy time 25 seconds. Dose 0.51 mGy. IMPRESSION: Intraoperative fluoroscopy during  ORIF of distal fibular fracture. Electronically Signed   By: Andrea Gasman M.D.   On: 08/16/2024 17:54   DG C-Arm 1-60 Min-No Report Result Date: 08/16/2024 Fluoroscopy was utilized by the requesting physician.  No radiographic interpretation.   DG C-Arm 1-60 Min-No Report Result Date: 08/16/2024 Fluoroscopy was utilized by the requesting physician.  No radiographic interpretation.   US  RENAL Result Date: 08/15/2024 CLINICAL DATA:  Acute kidney injury EXAM: RENAL / URINARY  TRACT ULTRASOUND COMPLETE COMPARISON:  CT abdomen and pelvis 08/03/2024 FINDINGS: Right Kidney: Renal measurements: 11.1 x 4.6 x 5.5 cm = volume: 147 mL. Echogenicity within normal limits. No mass or hydronephrosis visualized. Left Kidney: Renal measurements: 10.1 x 5.2 x 6.3 cm = volume: 171 mL. Echogenicity within normal limits. There is no hydronephrosis. There is a simple cyst in the superior pole measuring 3.0 x 2.7 x 2.9 cm. Bladder: Appears normal for degree of bladder distention. Other: None. IMPRESSION: 1. No hydronephrosis. 2. Simple cyst in the superior pole of the left kidney. Electronically Signed   By: Greig Pique M.D.   On: 08/15/2024 22:23   DG Ankle 2 Views Left Result Date: 08/15/2024 CLINICAL DATA:  Post closed reduction of left ankle fractures. EXAM: LEFT ANKLE - 2 VIEW COMPARISON:  08/15/2024 at 4:12 p.m. FINDINGS: Lateral talar subluxation has been mostly reduced. There is significant improved alignment of the distal fibular fracture now with 2-3 mm of residual lateral displacement. Ankle is supported in a fiberglass cast. IMPRESSION: Significant improved fracture and ankle joint alignment following closed reduction. Electronically Signed   By: Alm Parkins M.D.   On: 08/15/2024 18:38   DG Tibia/Fibula Left Result Date: 08/15/2024 CLINICAL DATA:  Pain. EXAM: LEFT FOOT - 2 VIEW; LEFT KNEE - COMPLETE 4+ VIEW; LEFT TIBIA AND FIBULA - 2 VIEW; LEFT ANKLE - 2 VIEW COMPARISON:  Left knee x-ray 01/15/2022  FINDINGS: Left knee: There is no evidence of fracture or dislocation. Joint spaces are well maintained. There is mild tricompartmental osteophyte formation. Soft tissues are unremarkable. Left ankle: There is an acute oblique fracture through the distal fibula at the level of the ankle mortise. The distal fracture fragment is distracted 5 mm laterally. There is abnormal widening of the medial talotibial joint space with subluxation of the distal tibia medially 5 mm. Small avulsion fracture fragments are seen between the tip of the medial malleolus in the adjacent talus. There is soft tissue swelling surrounding the ankle. Left foot: There is soft tissue swelling of the dorsal foot. There is no additional fracture or dislocation. There is mild hallux valgus. Rounded sclerotic density in the calcaneus measures 6 mm. Left tibia and fibula: There is soft tissue swelling of the lower extremity. There is no foreign body. There is no acute fracture or dislocation. No focal osseous lesion identified. Joint spaces are maintained. IMPRESSION: 1. Acute fracture through the distal fibula. 2. Medial subluxation of the distal tibia in relation to the talus. 3. Small avulsion fracture fragments between the tip of the medial malleolus and the adjacent talus. 4. Soft tissue swelling of the lower extremity and foot. 5. No additional acute fracture or dislocation of the left knee, left foot, or left tibia and fibula. Electronically Signed   By: Greig Pique M.D.   On: 08/15/2024 17:01   DG Knee Complete 4 Views Left Result Date: 08/15/2024 CLINICAL DATA:  Pain. EXAM: LEFT FOOT - 2 VIEW; LEFT KNEE - COMPLETE 4+ VIEW; LEFT TIBIA AND FIBULA - 2 VIEW; LEFT ANKLE - 2 VIEW COMPARISON:  Left knee x-ray 01/15/2022 FINDINGS: Left knee: There is no evidence of fracture or dislocation. Joint spaces are well maintained. There is mild tricompartmental osteophyte formation. Soft tissues are unremarkable. Left ankle: There is an acute oblique  fracture through the distal fibula at the level of the ankle mortise. The distal fracture fragment is distracted 5 mm laterally. There is abnormal widening of the medial talotibial joint space with subluxation of the  distal tibia medially 5 mm. Small avulsion fracture fragments are seen between the tip of the medial malleolus in the adjacent talus. There is soft tissue swelling surrounding the ankle. Left foot: There is soft tissue swelling of the dorsal foot. There is no additional fracture or dislocation. There is mild hallux valgus. Rounded sclerotic density in the calcaneus measures 6 mm. Left tibia and fibula: There is soft tissue swelling of the lower extremity. There is no foreign body. There is no acute fracture or dislocation. No focal osseous lesion identified. Joint spaces are maintained. IMPRESSION: 1. Acute fracture through the distal fibula. 2. Medial subluxation of the distal tibia in relation to the talus. 3. Small avulsion fracture fragments between the tip of the medial malleolus and the adjacent talus. 4. Soft tissue swelling of the lower extremity and foot. 5. No additional acute fracture or dislocation of the left knee, left foot, or left tibia and fibula. Electronically Signed   By: Greig Pique M.D.   On: 08/15/2024 17:01   DG Foot 2 Views Left Result Date: 08/15/2024 CLINICAL DATA:  Pain. EXAM: LEFT FOOT - 2 VIEW; LEFT KNEE - COMPLETE 4+ VIEW; LEFT TIBIA AND FIBULA - 2 VIEW; LEFT ANKLE - 2 VIEW COMPARISON:  Left knee x-ray 01/15/2022 FINDINGS: Left knee: There is no evidence of fracture or dislocation. Joint spaces are well maintained. There is mild tricompartmental osteophyte formation. Soft tissues are unremarkable. Left ankle: There is an acute oblique fracture through the distal fibula at the level of the ankle mortise. The distal fracture fragment is distracted 5 mm laterally. There is abnormal widening of the medial talotibial joint space with subluxation of the distal tibia  medially 5 mm. Small avulsion fracture fragments are seen between the tip of the medial malleolus in the adjacent talus. There is soft tissue swelling surrounding the ankle. Left foot: There is soft tissue swelling of the dorsal foot. There is no additional fracture or dislocation. There is mild hallux valgus. Rounded sclerotic density in the calcaneus measures 6 mm. Left tibia and fibula: There is soft tissue swelling of the lower extremity. There is no foreign body. There is no acute fracture or dislocation. No focal osseous lesion identified. Joint spaces are maintained. IMPRESSION: 1. Acute fracture through the distal fibula. 2. Medial subluxation of the distal tibia in relation to the talus. 3. Small avulsion fracture fragments between the tip of the medial malleolus and the adjacent talus. 4. Soft tissue swelling of the lower extremity and foot. 5. No additional acute fracture or dislocation of the left knee, left foot, or left tibia and fibula. Electronically Signed   By: Greig Pique M.D.   On: 08/15/2024 17:01   DG Ankle 2 Views Left Result Date: 08/15/2024 CLINICAL DATA:  Pain. EXAM: LEFT FOOT - 2 VIEW; LEFT KNEE - COMPLETE 4+ VIEW; LEFT TIBIA AND FIBULA - 2 VIEW; LEFT ANKLE - 2 VIEW COMPARISON:  Left knee x-ray 01/15/2022 FINDINGS: Left knee: There is no evidence of fracture or dislocation. Joint spaces are well maintained. There is mild tricompartmental osteophyte formation. Soft tissues are unremarkable. Left ankle: There is an acute oblique fracture through the distal fibula at the level of the ankle mortise. The distal fracture fragment is distracted 5 mm laterally. There is abnormal widening of the medial talotibial joint space with subluxation of the distal tibia medially 5 mm. Small avulsion fracture fragments are seen between the tip of the medial malleolus in the adjacent talus. There is soft tissue swelling  surrounding the ankle. Left foot: There is soft tissue swelling of the dorsal foot.  There is no additional fracture or dislocation. There is mild hallux valgus. Rounded sclerotic density in the calcaneus measures 6 mm. Left tibia and fibula: There is soft tissue swelling of the lower extremity. There is no foreign body. There is no acute fracture or dislocation. No focal osseous lesion identified. Joint spaces are maintained. IMPRESSION: 1. Acute fracture through the distal fibula. 2. Medial subluxation of the distal tibia in relation to the talus. 3. Small avulsion fracture fragments between the tip of the medial malleolus and the adjacent talus. 4. Soft tissue swelling of the lower extremity and foot. 5. No additional acute fracture or dislocation of the left knee, left foot, or left tibia and fibula. Electronically Signed   By: Greig Pique M.D.   On: 08/15/2024 17:01   CT ABDOMEN PELVIS WO CONTRAST Result Date: 08/03/2024 EXAM: CT ABDOMEN AND PELVIS WITHOUT CONTRAST 08/03/2024 07:48:11 PM TECHNIQUE: CT of the abdomen and pelvis was performed without the administration of intravenous contrast. Multiplanar reformatted images are provided for review. Automated exposure control, iterative reconstruction, and/or weight-based adjustment of the mA/kV was utilized to reduce the radiation dose to as low as reasonably achievable. COMPARISON: None available. CLINICAL HISTORY: elevated creatinine, decreased urine FINDINGS: LOWER CHEST: No acute abnormality. LIVER: The liver is unremarkable. GALLBLADDER AND BILE DUCTS: Gallbladder is unremarkable. No biliary ductal dilatation. SPLEEN: No acute abnormality. PANCREAS: No acute abnormality. ADRENAL GLANDS: No acute abnormality. KIDNEYS, URETERS AND BLADDER: Fluid lesion within the left kidney likely represents a simple renal cyst. Simple renal cysts do not require additional follow-up unless clinically indicated due to signs/symptoms. No stones in the kidneys or ureters. No hydronephrosis. No perinephric or periureteral stranding. Urinary bladder is  unremarkable. GI AND BOWEL: Stomach demonstrates no acute abnormality. There is no bowel obstruction. No small or large bowel thickening or dilatation. Appendix is unremarkable. PERITONEUM AND RETROPERITONEUM: No ascites. No free air. VASCULATURE: Aorta is normal in caliber. LYMPH NODES: No lymphadenopathy. REPRODUCTIVE ORGANS: Status post hysterectomy. No adnexal mass. BONES AND SOFT TISSUES: No acute osseous abnormality. Intervertebral disc space vacuum phenomenon at the L5-S1 level. Disc bulge at the L3-L4, L4-L5 and L5-S1 levels. IMPRESSION: 1. No acute findings in the abdomen or pelvis is limited evaluation on this noncontrast study. . 2. Other, non-acute and/or normal findings as above. Electronically signed by: Morgane Naveau MD 08/03/2024 08:30 PM EST RP Workstation: [id number]    Microbiology: Recent Results (from the past 240 hours)  Surgical pcr screen     Status: None   Collection Time: 08/16/24 10:37 AM   Specimen: Nasal Mucosa; Nasal Swab  Result Value Ref Range Status   MRSA, PCR NEGATIVE NEGATIVE Final   Staphylococcus aureus NEGATIVE NEGATIVE Final    Comment: (NOTE) The Xpert SA Assay (FDA approved for NASAL specimens in patients 72 years of age and older), is one component of a comprehensive surveillance program. It is not intended to diagnose infection nor to guide or monitor treatment. Performed at [facility], [address], [place], [place] [address]      Labs: Basic Metabolic Panel: Recent Labs  Lab 08/15/24 1622 08/16/24 0431 08/17/24 0823 08/18/24 0458 08/19/24 0524 08/20/24 0648  NA 140 138 142 142 141 142  K 4.5 3.8 4.3 3.9 4.2 4.0  CL 102 104 109 109 108 108  CO2 24 23 23 24 26 25   GLUCOSE 100* 90 129* 103* 90 88  BUN  35* 33* 30* 32* 22 25*  CREATININE 3.18* 2.68* 2.14* 2.16* 1.51* 1.52*  CALCIUM  9.4 8.7* 8.7* 8.1* 8.7* 8.5*  MG 1.9  --   --   --   --   --    Liver Function Tests: Recent Labs  Lab 08/15/24 1622  AST 37   ALT 28  ALKPHOS 76  BILITOT 0.6  PROT 7.6  ALBUMIN  3.8   No results for input(s): LIPASE, AMYLASE in the last 168 hours. No results for input(s): AMMONIA in the last 168 hours. CBC: Recent Labs  Lab 08/15/24 1622 08/16/24 0431 08/17/24 0823 08/18/24 0458 08/18/24 1908 08/19/24 0524 08/20/24 0648  WBC 10.4   < > 9.5 8.0 9.9 6.1 6.6  NEUTROABS 7.4  --   --   --  5.0  --  3.9  HGB 9.6*   < > 7.3* 7.0* 11.0* 10.4* 9.9*  HCT 30.2*   < > 23.4* 22.4* 33.6* 32.7* 31.2*  MCV 89.3   < > 90.3 90.0 85.7 87.9 86.9  PLT 428*   < > 376 392 427* 377 396   < > = values in this interval not displayed.   Cardiac Enzymes: No results for input(s): CKTOTAL, CKMB, CKMBINDEX, TROPONINI in the last 168 hours. BNP: BNP (last 3 results) No results for input(s): BNP in the last 8760 hours.  ProBNP (last 3 results) Recent Labs    03/15/24 1620  PROBNP 266.0    CBG: Recent Labs  Lab 08/16/24 0429  GLUCAP 100*       Signed:  Toribio Hummer MD.  [facility] 08/20/2024, 2:45 PM

## 2024-08-20 NOTE — Progress Notes (Incomplete)
 PROGRESS NOTE    Debbie Brandt  FMW:[id number] DOB: 14-Oct-1960 DOA: 08/15/2024 PCP: Lendia Boby LITTIE, NP-C    Chief Complaint  Patient presents with   Foot Pain    Brief Narrative:  Patient lives at home with her brother and is dependent on him for all ADLs due to dementia. This is a 63 year old female with history of dementia hypertension hyperlipidemia hypothyroidism SBO depression GERD came to the ER for complaints of left foot pain and is found to have acute fracture through the distal fibula and medial subluxation of the distal tibia in relation to the talus with soft tissue swelling of the left lower extremity and foot. Denies falls or trauma. At baseline she ambulates without an assistive device.    Assessment & Plan:   Principal Problem:   AKI (acute kidney injury) Active Problems:   Essential hypertension   Closed fracture of left ankle   Severe Alzheimer's dementia (HCC)   Insomnia   Acute postoperative anemia due to expected blood loss   Anemia of chronic disease  #1 acute distal fibular fracture/left foot fracture -Status post ORIF on 08/16/2024 per orthopedics. - Per orthopedics patient NWB to left lower extremity, keep splint clean and dry at all times, elevate, aspirin  81 mg twice daily x 4 weeks for DVT prophylaxis. - Patient with history of dementia at baseline walks around without any assistive device. - Continue current pain control with Tylenol  and oxycodone . - Concern patient may have been orthostatic causing her fall as patient on presentation has soft blood pressure noted to be on triamterene  HCTZ and Cozaar  at home. - Patient seen by PT/OT recommending SNF placement.  2.  AKI -Baseline creatinine approximately 0.9-1.1. - Patient on admission noted to have a creatinine of 3.18. - Likely secondary to a prerenal azotemia in the setting of HCTZ and Cozaar . -Creatinine currently at 1.51 from 2.16 from 3.18 on admission. -Creatinine noted at 1.87 on day of  discharge, 08/05/2024, during last hospitalization. - Improved with hydration  - Continue to hold HCTZ, Cozaar .   - Follow.  3.  Postop acute blood loss anemia/anemia -Patient noted with a hemoglobin of 7.0 (08/18/2024) from 9.6 on admission. - Anemia panel with iron level of 25, TIBC of 161, ferritin of 576. - Status post transfusion 2 units PRBCs hemoglobin currently at 10.4.  - Follow H&H.  4.  Hypertension -Continue to hold triamterene  HCTZ. - Hold Cozaar  due to AKI and likely will not resume on discharge. - Start Norvasc  5 mg daily.   - Hydralazine  as needed.   5.  Hyperlipidemia - Continue statin.   6.  Hypothyroidism - Synthroid .    7.  Dementia -Patient noted to be mostly nonverbal. - Continue Aricept , Namenda . - Patient noted to have sundowning every evening as patient tries to climb out of bed and ripping out IVs and as such patient started on Seroquel  12.5 mg nightly. - Follow-up   DVT prophylaxis: Aspirin  81 mg twice daily. Code Status: Full Family Communication: No family at bedside. Disposition: SNF when medically stable hopefully in the next 24 hours.  Status is: Inpatient Remains inpatient appropriate because: Severity of illness   Consultants:  Orthopedics: Dr. Germaine 08/16/2024  Procedures:  Plan films of the left ankle 08/16/2024 ORIF left distal fibular fracture/ORIF of left ankle syndesmosis 08/16/2024 per Dr. Germaine Transfuse 2 units PRBCs 08/18/2024  Antimicrobials:  Anti-infectives (From admission, onward)    Start     Dose/Rate Route Frequency Ordered Stop   08/16/24  2200  ceFAZolin  (ANCEF ) IVPB 2g/100 mL premix        2 g 200 mL/hr over 30 Minutes Intravenous Every 8 hours 08/16/24 1658 08/17/24 0822   08/16/24 1516  vancomycin  (VANCOCIN ) powder  Status:  Discontinued          As needed 08/16/24 1516 08/16/24 1557   08/16/24 0600  ceFAZolin  (ANCEF ) IVPB 2g/100 mL premix        2 g 200 mL/hr over 30 Minutes Intravenous On call to O.R.  08/16/24 0514 08/16/24 1415         Subjective: Patient lying in bed.  Patient awake.  Patient mouthing few answers to questions appropriately.  Minimally verbal.  Denies any chest pain or shortness of breath.  No abdominal pain.  Sitter at bedside.   Objective: Vitals:   08/19/24 1220 08/19/24 2024 08/20/24 0452 08/20/24 0940  BP: (!) 152/77 124/62 (!) 155/88 (!) 155/88  Pulse: (!) 57 75 80   Resp: 18 18 18    Temp: 97.8 F (36.6 C) 98.3 F (36.8 C) 98 F (36.7 C)   TempSrc:  Oral Oral   SpO2: 100% 98% 100%   Weight:      Height:        Intake/Output Summary (Last 24 hours) at 08/20/2024 1241 Last data filed at 08/20/2024 1200 Gross per 24 hour  Intake 1062.06 ml  Output 700 ml  Net 362.06 ml   Filed Weights   08/15/24 1532 08/15/24 2025  Weight: 70.8 kg 69.1 kg    Examination:  General exam: NAD Respiratory system: CTAB.  No wheezes, no crackles, no rhonchi.  Fair air movement.  Speaking in full sentences.  Cardiovascular system: Regular rate and rhythm no murmurs rubs or gallops.  No JVD.  No lower extremity edema.  Gastrointestinal system: Abdomen is soft, nontender, nondistended, positive bowel sounds.  No rebound.  No guarding.  Central nervous system: Alert and oriented.  Moving extremities spontaneously.  No focal neurological deficits.  Extremities: Left lower extremity in postop bandage/splint.  Skin: No rashes, lesions or ulcers Psychiatry: Judgement and insight unable to assess. Mood & affect appropriate.     Data Reviewed: I have personally reviewed following labs and imaging studies  CBC: Recent Labs  Lab 08/15/24 1622 08/16/24 0431 08/17/24 0823 08/18/24 0458 08/18/24 1908 08/19/24 0524 08/20/24 0648  WBC 10.4   < > 9.5 8.0 9.9 6.1 6.6  NEUTROABS 7.4  --   --   --  5.0  --  3.9  HGB 9.6*   < > 7.3* 7.0* 11.0* 10.4* 9.9*  HCT 30.2*   < > 23.4* 22.4* 33.6* 32.7* 31.2*  MCV 89.3   < > 90.3 90.0 85.7 87.9 86.9  PLT 428*   < > 376 392 427* 377  396   < > = values in this interval not displayed.    Basic Metabolic Panel: Recent Labs  Lab 08/15/24 1622 08/16/24 0431 08/17/24 0823 08/18/24 0458 08/19/24 0524 08/20/24 0648  NA 140 138 142 142 141 142  K 4.5 3.8 4.3 3.9 4.2 4.0  CL 102 104 109 109 108 108  CO2 24 23 23 24 26 25   GLUCOSE 100* 90 129* 103* 90 88  BUN 35* 33* 30* 32* 22 25*  CREATININE 3.18* 2.68* 2.14* 2.16* 1.51* 1.52*  CALCIUM  9.4 8.7* 8.7* 8.1* 8.7* 8.5*  MG 1.9  --   --   --   --   --     GFR: Estimated Creatinine  Clearance: 35.2 mL/min (A) (by C-G formula based on SCr of 1.52 mg/dL (H)).  Liver Function Tests: Recent Labs  Lab 08/15/24 1622  AST 37  ALT 28  ALKPHOS 76  BILITOT 0.6  PROT 7.6  ALBUMIN  3.8    CBG: Recent Labs  Lab 08/16/24 0429  GLUCAP 100*     Recent Results (from the past 240 hours)  Surgical pcr screen     Status: None   Collection Time: 08/16/24 10:37 AM   Specimen: Nasal Mucosa; Nasal Swab  Result Value Ref Range Status   MRSA, PCR NEGATIVE NEGATIVE Final   Staphylococcus aureus NEGATIVE NEGATIVE Final    Comment: (NOTE) The Xpert SA Assay (FDA approved for NASAL specimens in patients 21 years of age and older), is one component of a comprehensive surveillance program. It is not intended to diagnose infection nor to guide or monitor treatment. Performed at [facility], [address], [place], [place] [address]          Radiology Studies: No results found.       Scheduled Meds:  acetaminophen   1,000 mg Oral TID   amLODipine   5 mg Oral Daily   aspirin  EC  81 mg Oral BID   atorvastatin   20 mg Oral Daily   bisacodyl   5 mg Oral Daily   cyanocobalamin   1,000 mcg Oral Daily   donepezil   10 mg Oral QHS   feeding supplement  237 mL Oral BID BM   levothyroxine   75 mcg Oral Q0600   memantine   10 mg Oral BID   mirtazapine   15 mg Oral QHS   pantoprazole   40 mg Oral Q0600   QUEtiapine   12.5 mg Oral QHS   Continuous  Infusions:  sodium chloride  100 mL/hr at 08/20/24 0955   sodium chloride        LOS: 5 days    Time spent: 35 minutes    Toribio Hummer, MD [facility]   To contact the attending provider between 7A-7P or the covering provider during after hours 7P-7A, please log into the web site [email or web address] and access using universal [place] password for that web site. If you do not have the password, please call the hospital operator.  08/20/2024, 12:41 PM

## 2024-08-20 NOTE — Plan of Care (Signed)
  Problem: Nutrition: Goal: Adequate nutrition will be maintained Outcome: Progressing   Problem: Coping: Goal: Level of anxiety will decrease Outcome: Progressing   Problem: Pain Managment: Goal: General experience of comfort will improve and/or be controlled Outcome: Progressing   Problem: Safety: Goal: Ability to remain free from injury will improve Outcome: Progressing

## 2024-08-20 NOTE — Progress Notes (Signed)
 Physical Therapy Treatment Patient Details Name: Debbie Brandt MRN: [id number] DOB: [birth year] Today's Date: 08/20/2024   History of Present Illness 63 yo female presents to therapy following hospital admission on 08/15/2024 due to L foot pain for 4 days prior and found to have acute distal fibula and medial subluxation of distal tibia in relation to the talus with soft tissue edema unclear of MOI at this time. Pt underwent L ORIF of L distal fibula and ankle syndesmosis on 12/1. PT is currently L distal LE NWB and L LE in soft cast/splint. Pt also found to have AKI. Pt PMH includes but is not limited to: dementia, HTN, HLD, hypothyroidism, SBO, GERD, and depression.    PT Comments  Cognition Comments: pt is oriented to her MIDDLE name Velia.  Quite. Requiring repeat, simple functional commands.  Pleasantly confused.  Safety sitter in room.  Brother in room.  He has been taking care of his sister for 6 years.  Pt was IND amb without any AD prior to admit.  Brother would assist with ADL's, meals and meds.    Assisted OOB to [facility] was difficult and required increased time and effort.  General transfer comment: required repeat simple VC's for proper hand placement and tactile cueing to ensure NWB while assisting 1/4 pivot turn from elevated bed to [facility] and again from [facility] to bed.  Therapist holding L LE off floor to ensure NWBing as Pt has a Hx Dementia. Positioned to comfort with L LE elevated.  Lpt has rec Pt will need ST Rehab at SNF to address mobility and functional decline prior to safely returning home.    If plan is discharge home, recommend the following: Two people to help with walking and/or transfers;A lot of help with bathing/dressing/bathroom;Assistance with cooking/housework;Assistance with feeding;Direct supervision/assist for medications management;Direct supervision/assist for financial management;Assist for transportation;Help with stairs or ramp for entrance;Supervision due to  cognitive status   Can travel by private vehicle     No  Equipment Recommendations  None recommended by PT    Recommendations for Other Services       Precautions / Restrictions Precautions Precautions: Fall Restrictions Weight Bearing Restrictions Per Provider Order: Yes LLE Weight Bearing Per Provider Order: Non weight bearing     Mobility  Bed Mobility Overal bed mobility: Needs Assistance Bed Mobility: Supine to Sit, Sit to Supine     Supine to sit: Min assist, Contact guard Sit to supine: Contact guard assist, Min assist   General bed mobility comments: repeat simple VC's and increased time assisted Pt OOB and back to bed    Transfers Overall transfer level: Needs assistance Equipment used: None Transfers: Bed to chair/wheelchair/BSC   Stand pivot transfers: Min assist         General transfer comment: required repeat simple VC's for proper hand placement and tactile cueing to ensure NWB while assisting 1/4 pivot turn from elevated bed to [facility] and again from [facility] to bed.  Therapist holding L LE off floor to ensure NWBing as Pt has a Hx Dementia.    Ambulation/Gait               General Gait Details: transfers only   Stairs             Wheelchair Mobility     Tilt Bed    Modified Rankin (Stroke Patients Only)       Balance  Communication Communication Communication: Impaired  Cognition Arousal: Alert Behavior During Therapy: Flat affect   PT - Cognitive impairments: History of cognitive impairments                       PT - Cognition Comments: pt is oriented to first name, pt response and follow commands better from brother than from PT at time of eval Following commands: Impaired Following commands impaired: Follows one step commands inconsistently, Follows one step commands with increased time    Cueing Cueing Techniques: Verbal cues, Gestural cues,  Tactile cues, Visual cues  Exercises      General Comments        Pertinent Vitals/Pain Pain Assessment Pain Assessment: No/denies pain    Home Living                          Prior Function            PT Goals (current goals can now be found in the care plan section) Progress towards PT goals: Progressing toward goals    Frequency    Min 2X/week      PT Plan      Co-evaluation              AM-PAC PT 6 Clicks Mobility   Outcome Measure  Help needed turning from your back to your side while in a flat bed without using bedrails?: A Lot Help needed moving from lying on your back to sitting on the side of a flat bed without using bedrails?: A Lot Help needed moving to and from a bed to a chair (including a wheelchair)?: A Lot Help needed standing up from a chair using your arms (e.g., wheelchair or bedside chair)?: A Lot Help needed to walk in hospital room?: Total Help needed climbing 3-5 steps with a railing? : Total 6 Click Score: 10    End of Session Equipment Utilized During Treatment: Gait belt Activity Tolerance: Patient tolerated treatment well Patient left: in bed;with call bell/phone within reach;with bed alarm set;with family/visitor present Nurse Communication: Mobility status PT Visit Diagnosis: Unsteadiness on feet (R26.81);Other abnormalities of gait and mobility (R26.89);Muscle weakness (generalized) (M62.81);Pain;Difficulty in walking, not elsewhere classified (R26.2) Pain - Right/Left: Left Pain - part of body: Leg;Ankle and joints of foot     Time: 1446-1501 PT Time Calculation (min) (ACUTE ONLY): 15 min  Charges:    $Therapeutic Activity: 8-22 mins PT General Charges $$ ACUTE PT VISIT: 1 Visit                     Katheryn Leap  PTA Acute  Rehabilitation Services Office M-F          [phone]

## 2024-08-20 NOTE — Plan of Care (Signed)
  Problem: Activity: Goal: Risk for activity intolerance will decrease Outcome: Progressing   Problem: Nutrition: Goal: Adequate nutrition will be maintained Outcome: Progressing   Problem: Coping: Goal: Level of anxiety will decrease Outcome: Progressing   Problem: Pain Managment: Goal: General experience of comfort will improve and/or be controlled Outcome: Progressing   Problem: Safety: Goal: Ability to remain free from injury will improve Outcome: Progressing

## 2024-08-21 DIAGNOSIS — D62 Acute posthemorrhagic anemia: Secondary | ICD-10-CM | POA: Diagnosis not present

## 2024-08-21 DIAGNOSIS — D638 Anemia in other chronic diseases classified elsewhere: Secondary | ICD-10-CM | POA: Diagnosis not present

## 2024-08-21 DIAGNOSIS — G309 Alzheimer's disease, unspecified: Secondary | ICD-10-CM | POA: Diagnosis not present

## 2024-08-21 DIAGNOSIS — N179 Acute kidney failure, unspecified: Secondary | ICD-10-CM | POA: Diagnosis not present

## 2024-08-21 LAB — BASIC METABOLIC PANEL WITH GFR
Anion gap: 7 (ref 5–15)
BUN: 15 mg/dL (ref 8–23)
CO2: 26 mmol/L (ref 22–32)
Calcium: 8.1 mg/dL — ABNORMAL LOW (ref 8.9–10.3)
Chloride: 110 mmol/L (ref 98–111)
Creatinine, Ser: 1.22 mg/dL — ABNORMAL HIGH (ref 0.44–1.00)
GFR, Estimated: 49 mL/min — ABNORMAL LOW (ref >60)
Glucose, Bld: 90 mg/dL (ref 70–99)
Potassium: 4.1 mmol/L (ref 3.5–5.1)
Sodium: 143 mmol/L (ref 135–145)

## 2024-08-21 LAB — CBC
HCT: 33.7 % — ABNORMAL LOW (ref 36.0–46.0)
Hemoglobin: 10.6 g/dL — ABNORMAL LOW (ref 12.0–15.0)
MCH: 28.2 pg (ref 26.0–34.0)
MCHC: 31.5 g/dL (ref 30.0–36.0)
MCV: 89.6 fL (ref 80.0–100.0)
Platelets: 403 K/uL — ABNORMAL HIGH (ref 150–400)
RBC: 3.76 MIL/uL — ABNORMAL LOW (ref 3.87–5.11)
RDW: 14.4 % (ref 11.5–15.5)
WBC: 6.3 K/uL (ref 4.0–10.5)
nRBC: 0 % (ref 0.0–0.2)

## 2024-08-21 MED ORDER — QUETIAPINE FUMARATE 25 MG PO TABS
12.5000 mg | ORAL_TABLET | Freq: Every evening | ORAL | Status: DC | PRN
  Administered 2024-08-21: 12.5 mg via ORAL
  Filled 2024-08-21: qty 1

## 2024-08-21 NOTE — TOC Progression Note (Signed)
 Transition of Care [facility]) - Progression Note    Patient Details  Name: Debbie Brandt MRN: [id number] Date of Birth: Apr 24, 1961  [facility] of [facility]) CM/SW Contact  Sonda Manuella Quill, RN Phone Number: 08/21/2024, 9:49 AM  Clinical Narrative:    Ins auth received; Plan Auth ID # [id number]; Auth ID # [id number]; start date 08/20/24, end date 08/24/24; Brittany at [facility] notified; she said pt cannot admit to facility until Monday;  Dr Sebastian  notified via secure chat.     Expected Discharge Plan: Home/Self Care Barriers to Discharge: Continued Medical Work up               Expected Discharge Plan and Services In-house Referral: Clinical Social Work Discharge Planning Services: NA Post Acute Care Choice: NA Living arrangements for the past 2 months: Single Family Home Expected Discharge Date: 08/20/24               DME Arranged: N/A DME Agency: NA       HH Arranged: NA HH Agency: NA         Social Drivers of Health (SDOH) Interventions SDOH Screenings   Food Insecurity: No Food Insecurity (08/16/2024)  Housing: Low Risk  (08/16/2024)  Transportation Needs: No Transportation Needs (08/16/2024)  Utilities: Not At Risk (08/16/2024)  Depression (PHQ2-9): Low Risk  (08/03/2024)  Physical Activity: Inactive (08/03/2024)  Social Connections: Unknown (08/16/2024)  Recent Concern: Social Connections - Socially Isolated (08/03/2024)  Stress: No Stress Concern Present (08/03/2024)  Tobacco Use: Low Risk  (08/15/2024)  Health Literacy: Adequate Health Literacy (08/03/2024)    Readmission Risk Interventions    08/16/2024    3:57 PM  Readmission Risk Prevention Plan  Transportation Screening Complete  PCP or Specialist Appt within 5-7 Days Complete  Home Care Screening Complete  Medication Review (RN CM) Complete

## 2024-08-21 NOTE — Progress Notes (Signed)
 PROGRESS NOTE    Debbie Brandt  FMW:[id number] DOB: September 27, 1960 DOA: 08/15/2024 PCP: Lendia Boby LITTIE, NP-C    Chief Complaint  Patient presents with   Foot Pain    Brief Narrative:  Patient lives at home with her brother and is dependent on him for all ADLs due to dementia. This is a 63 year old female with history of dementia hypertension hyperlipidemia hypothyroidism SBO depression GERD came to the ER for complaints of left foot pain and is found to have acute fracture through the distal fibula and medial subluxation of the distal tibia in relation to the talus with soft tissue swelling of the left lower extremity and foot. Denies falls or trauma. At baseline she ambulates without an assistive device.    Assessment & Plan:   Principal Problem:   AKI (acute kidney injury) Active Problems:   Essential hypertension   Closed fracture of left ankle   Severe Alzheimer's dementia (HCC)   Insomnia   Acute postoperative anemia due to expected blood loss   Anemia of chronic disease  #1 acute distal fibular fracture/left foot fracture -Status post ORIF on 08/16/2024 per orthopedics. - Per orthopedics patient NWB to left lower extremity, keep splint clean and dry at all times, elevate, aspirin  81 mg twice daily x 4 weeks for DVT prophylaxis. - Patient with history of dementia at baseline walks around without any assistive device. - Continue current pain control with Tylenol  and oxycodone . - Concern patient may have been orthostatic causing her fall as patient on presentation has soft blood pressure noted to be on triamterene  HCTZ and Cozaar  at home. - Patient seen by PT/OT recommending SNF placement.  2.  AKI -Baseline creatinine approximately 0.9-1.1. - Patient on admission noted to have a creatinine of 3.18. - Likely secondary to a prerenal azotemia in the setting of HCTZ and Cozaar . -Creatinine currently at 1.22 from 1.51 from 2.16 from 3.18 on admission. -Creatinine noted at 1.87  on day of discharge, 08/05/2024, during last hospitalization. - Improved with hydration. -Saline lock IV fluids. - Continue to hold HCTZ, Cozaar .   - Follow.  3.  Postop acute blood loss anemia/anemia -Patient noted with a hemoglobin of 7.0 (08/18/2024) from 9.6 on admission. - Anemia panel with iron level of 25, TIBC of 161, ferritin of 576. - Status post transfusion 2 units PRBCs hemoglobin currently at 10.6. - Follow H&H.  4.  Hypertension -Continue to hold triamterene  HCTZ. - Hold Cozaar  due to AKI and likely will not resume on discharge. - Continue Norvasc  10 mg daily.    - Hydralazine  as needed.   5.  Hyperlipidemia - Statin.    6.  Hypothyroidism - Continue Synthroid .   7.  Dementia -Patient noted to be mostly nonverbal/very soft-spoken.. - Continue Aricept , Namenda . - Patient noted to have sundowning every evening as patient tries to climb out of bed and ripping out IVs and as such patient started on Seroquel  12.5 mg nightly. -Due to increased drowsiness in the morning we will change Seroquel  to as needed. - Follow.   DVT prophylaxis: Aspirin  81 mg twice daily. Code Status: Full Family Communication: No family at bedside. Disposition: Patient medically stable.  Awaiting SNF placement.  Status is: Inpatient Remains inpatient appropriate because: Severity of illness   Consultants:  Orthopedics: Dr. Germaine 08/16/2024  Procedures:  Plan films of the left ankle 08/16/2024 ORIF left distal fibular fracture/ORIF of left ankle syndesmosis 08/16/2024 per Dr. Germaine Transfuse 2 units PRBCs 08/18/2024  Antimicrobials:  Anti-infectives (From admission,  onward)    Start     Dose/Rate Route Frequency Ordered Stop   08/16/24 2200  ceFAZolin  (ANCEF ) IVPB 2g/100 mL premix        2 g 200 mL/hr over 30 Minutes Intravenous Every 8 hours 08/16/24 1658 08/17/24 0822   08/16/24 1516  vancomycin  (VANCOCIN ) powder  Status:  Discontinued          As needed 08/16/24 1516 08/16/24  1557   08/16/24 0600  ceFAZolin  (ANCEF ) IVPB 2g/100 mL premix        2 g 200 mL/hr over 30 Minutes Intravenous On call to O.R. 08/16/24 0514 08/16/24 1415         Subjective: Patient sleeping but easily arousable.  Denies any chest pain or shortness of breath.  No abdominal pain.  Speaking very softly and answering questions softly and appropriately.  Sitter at bedside.    Objective: Vitals:   08/20/24 0940 08/20/24 1329 08/20/24 1526 08/20/24 1956  BP: (!) 155/88 (!) 158/68 (!) 158/68 (!) 115/57  Pulse:  80  63  Resp:  20  18  Temp:  98.5 F (36.9 C)  98.3 F (36.8 C)  TempSrc:    Oral  SpO2:  100%  100%  Weight:      Height:        Intake/Output Summary (Last 24 hours) at 08/21/2024 1132 Last data filed at 08/21/2024 9071 Gross per 24 hour  Intake 2718.58 ml  Output 426 ml  Net 2292.58 ml   Filed Weights   08/15/24 1532 08/15/24 2025  Weight: 70.8 kg 69.1 kg    Examination:  General exam: NAD Respiratory system: CTAB anterior lung fields.  No wheezes, no crackles, no rhonchi.  Fair air movement.  No use of accessory muscles of respiration. Cardiovascular system: RRR no murmurs rubs or gallops.  No JVD.  No lower extremity edema.  Gastrointestinal system: Abdomen soft, nontender, nondistended, positive bowel sounds.  No rebound.  No guarding.  Central nervous system: Alert and oriented.  Moving extremities spontaneously.  No focal neurological deficits.  Extremities: Left lower extremity in postop bandage/splint.  Skin: No rashes, lesions or ulcers Psychiatry: Judgement and insight unable to assess. Mood & affect appropriate.     Data Reviewed: I have personally reviewed following labs and imaging studies  CBC: Recent Labs  Lab 08/15/24 1622 08/16/24 0431 08/18/24 0458 08/18/24 1908 08/19/24 0524 08/20/24 0648 08/21/24 0837  WBC 10.4   < > 8.0 9.9 6.1 6.6 6.3  NEUTROABS 7.4  --   --  5.0  --  3.9  --   HGB 9.6*   < > 7.0* 11.0* 10.4* 9.9* 10.6*  HCT  30.2*   < > 22.4* 33.6* 32.7* 31.2* 33.7*  MCV 89.3   < > 90.0 85.7 87.9 86.9 89.6  PLT 428*   < > 392 427* 377 396 403*   < > = values in this interval not displayed.    Basic Metabolic Panel: Recent Labs  Lab 08/15/24 1622 08/16/24 0431 08/17/24 0823 08/18/24 0458 08/19/24 0524 08/20/24 0648 08/21/24 0837  NA 140   < > 142 142 141 142 143  K 4.5   < > 4.3 3.9 4.2 4.0 4.1  CL 102   < > 109 109 108 108 110  CO2 24   < > 23 24 26 25 26   GLUCOSE 100*   < > 129* 103* 90 88 90  BUN 35*   < > 30* 32* 22 25* 15  CREATININE 3.18*   < > 2.14* 2.16* 1.51* 1.52* 1.22*  CALCIUM  9.4   < > 8.7* 8.1* 8.7* 8.5* 8.1*  MG 1.9  --   --   --   --   --   --    < > = values in this interval not displayed.    GFR: Estimated Creatinine Clearance: 43.9 mL/min (A) (by C-G formula based on SCr of 1.22 mg/dL (H)).  Liver Function Tests: Recent Labs  Lab 08/15/24 1622  AST 37  ALT 28  ALKPHOS 76  BILITOT 0.6  PROT 7.6  ALBUMIN  3.8    CBG: Recent Labs  Lab 08/16/24 0429  GLUCAP 100*     Recent Results (from the past 240 hours)  Surgical pcr screen     Status: None   Collection Time: 08/16/24 10:37 AM   Specimen: Nasal Mucosa; Nasal Swab  Result Value Ref Range Status   MRSA, PCR NEGATIVE NEGATIVE Final   Staphylococcus aureus NEGATIVE NEGATIVE Final    Comment: (NOTE) The Xpert SA Assay (FDA approved for NASAL specimens in patients 9 years of age and older), is one component of a comprehensive surveillance program. It is not intended to diagnose infection nor to guide or monitor treatment. Performed at [facility], [address] [place], [place] [address]          Radiology Studies: No results found.       Scheduled Meds:  acetaminophen   1,000 mg Oral TID   amLODipine   10 mg Oral Daily   aspirin  EC  81 mg Oral BID   atorvastatin   20 mg Oral Daily   bisacodyl   5 mg Oral Daily   cyanocobalamin   1,000 mcg Oral Daily   donepezil   10 mg Oral  QHS   feeding supplement  237 mL Oral BID BM   levothyroxine   75 mcg Oral Q0600   memantine   10 mg Oral BID   mirtazapine   15 mg Oral QHS   pantoprazole   40 mg Oral Q0600   Continuous Infusions:   LOS: 6 days    Time spent: 35 minutes    Toribio Hummer, MD [facility]   To contact the attending provider between 7A-7P or the covering provider during after hours 7P-7A, please log into the web site [email or web address] and access using universal [place] password for that web site. If you do not have the password, please call the hospital operator.  08/21/2024, 11:32 AM

## 2024-08-21 NOTE — Plan of Care (Signed)
  Problem: Education: Goal: Knowledge of General Education information will improve Description: Including pain rating scale, medication(s)/side effects and non-pharmacologic comfort measures Outcome: Progressing   Problem: Clinical Measurements: Goal: Ability to maintain clinical measurements within normal limits will improve Outcome: Progressing Goal: Will remain free from infection Outcome: Progressing Goal: Diagnostic test results will improve Outcome: Progressing Goal: Respiratory complications will improve Outcome: Progressing Goal: Cardiovascular complication will be avoided Outcome: Progressing   Problem: Nutrition: Goal: Adequate nutrition will be maintained Outcome: Progressing   Problem: Coping: Goal: Level of anxiety will decrease Outcome: Progressing   Problem: Elimination: Goal: Will not experience complications related to bowel motility Outcome: Progressing Goal: Will not experience complications related to urinary retention Outcome: Progressing   Problem: Pain Managment: Goal: General experience of comfort will improve and/or be controlled Outcome: Progressing   Problem: Safety: Goal: Ability to remain free from injury will improve Outcome: Progressing   Problem: Skin Integrity: Goal: Risk for impaired skin integrity will decrease Outcome: Progressing   Problem: Health Behavior/Discharge Planning: Goal: Ability to manage health-related needs will improve Outcome: Not Progressing   Problem: Activity: Goal: Risk for activity intolerance will decrease Outcome: Not Progressing

## 2024-08-22 DIAGNOSIS — D638 Anemia in other chronic diseases classified elsewhere: Secondary | ICD-10-CM | POA: Diagnosis not present

## 2024-08-22 DIAGNOSIS — G309 Alzheimer's disease, unspecified: Secondary | ICD-10-CM | POA: Diagnosis not present

## 2024-08-22 DIAGNOSIS — D62 Acute posthemorrhagic anemia: Secondary | ICD-10-CM | POA: Diagnosis not present

## 2024-08-22 DIAGNOSIS — N179 Acute kidney failure, unspecified: Secondary | ICD-10-CM | POA: Diagnosis not present

## 2024-08-22 MED ORDER — QUETIAPINE FUMARATE 25 MG PO TABS: 12.5000 mg | ORAL_TABLET | Freq: Every evening | ORAL | 0 refills | Status: AC | PRN

## 2024-08-22 NOTE — Discharge Summary (Signed)
 Physician Discharge Summary  Debbie Brandt FMW:[id number] DOB: 12-13-1960 DOA: 08/15/2024  PCP: Lendia Boby LITTIE, NP-C  Admit date: 08/15/2024 Discharge date: 08/22/2024  Time spent: 60 minutes  Recommendations for Outpatient Follow-up:  Follow-up with MD at skilled nursing facility.  Patient will need a basic metabolic profile done in 1 week to follow-up on electrolytes and renal function.  Patient will need a CBC to follow-up on counts. Follow-up with Dr. Germaine, orthopedics in 2 weeks.   Discharge Diagnoses:  Principal Problem:   AKI (acute kidney injury) Active Problems:   Essential hypertension   Closed fracture of left ankle   Severe Alzheimer's dementia (HCC)   Insomnia   Acute postoperative anemia due to expected blood loss   Anemia of chronic disease   Discharge Condition: Stable and improved.  Diet recommendation: Regular  Filed Weights   08/15/24 1532 08/15/24 2025  Weight: 70.8 kg 69.1 kg    History of present illness:  HPI per Dr. Lou Montie LITTIE Brandt is a 63 y.o. female with medical history significant for dementia, HTN, HLD, hypothyroidism, SBO, GERD and depression who presented to the ED for evaluation of left foot pain.  Patient is completely dependent on her brother for all her ADLs due to her dementia.  Currently resides with brother who is her POA.  Per brother, patient started reporting some pain with pressure on her left foot 2 days ago.  Patient sleeps in her own bed and ambulates without an assistive device. They have not seen patient fall or trip in the house.  He is unsure when patient sustained the left foot injury.  He also reports that patient has been eating and drinking okay.  At the bedside, patient was/shivering, occasionally mumbles but difficult to make out what she is saying.   ED Course: Initial vitals show patient afebrile, HR 60-70s, BP 107/51, SpO2 100% on room air. Initial labs significant for creatinine 3.18, WBC 10.4, Hgb 9.6,  platelet 428, mag 1.9. EKG shows sinus rhythm.  X-ray tibia/fibula left shows acute fracture through the distal fibular and medial subluxation of the distal tibia in relation to the talus with soft tissue swelling of the lower extremity and foot.  Pt received IV LR 1 L bolus x 2, and IV fentanyl .  Patient was given propofol  and her left ankle was reduced. Orthopedic surgery was consulted for evaluation. [facility] was consulted for admission.   Hospital Course:  #1 acute distal fibular fracture/left foot fracture -Status post ORIF on 08/16/2024 per orthopedics. - Per orthopedics patient NWB to left lower extremity, keep splint clean and dry at all times, elevate, aspirin  81 mg twice daily x 4 weeks for DVT prophylaxis. - Patient with history of dementia at baseline walks around without any assistive device. - Patient's pain controlled during hospitalization with scheduled Tylenol  and oxycodone .   - Concern patient may have been orthostasis causing her fall as patient on presentation had soft blood pressure noted to be on triamterene  HCTZ and Cozaar  at home. -Patient seen by PT/OT who recommended SNF placement. -Patient was discharged to SNF. -Outpatient follow-up with orthopedics, Dr. Germaine in 2 to 3 weeks. -Patient will be discharged to SNF.   2.  AKI -Baseline creatinine approximately 0.9-1.1. - Patient on admission noted to have a creatinine of 3.18. - Likely secondary to a prerenal azotemia in the setting of HCTZ and Cozaar . -Creatinine improved during the hospitalization such that by day of discharge creatinine was down to 1.52 from 3.18 on admission. -Creatinine  noted at 1.87 on day of discharge during last hospitalization, 08/05/2024. -Patient was HCTZ and Cozaar  were held during hospitalization and will not be resumed on discharge. -Patient's renal function improved with hydration. -Outpatient follow-up.   3.  Postop acute blood loss anemia/anemia -Patient noted with a hemoglobin of 7.0  (08/18/2024) from 9.6 on admission. - Anemia panel with iron level of 25, TIBC of 161, ferritin of 576. - Status post transfusion 2 units PRBCs hemoglobin stabilized at 9.9 by day of discharge.    4.  Hypertension - Patient's triamterene  HCTZ and Cozaar  were held during hospitalization due to AKI and will not be resumed on discharge. - Patient is on Norvasc  5 mg daily which was uptitrated to 10 mg daily for blood pressure control. - Patient also maintained on hydralazine  as needed. - Outpatient follow-up.   5.  Hyperlipidemia - Patient maintained on home regimen statin.    6.  Hypothyroidism - Patient maintained on home regimen Synthroid .     7.  Dementia -Patient noted to be mostly nonverbal versus minimally verbal. - Patient maintained on home regimen Aricept , Namenda . - Patient noted to have sundowning every evening as patient tries to climb out of bed and ripping out IVs and as such patient started on Seroquel  12.5 mg nightly. -Patient was discharged on Seroquel  on a as needed basis. -Outpatient follow-up.    Procedures: Plain films of the left ankle 08/16/2024 ORIF left distal fibular fracture/ORIF of left ankle syndesmosis 08/16/2024 per Dr. Germaine Transfuse 2 units PRBCs 08/18/2024  Consultations: Orthopedics: Dr. Germaine 08/16/2024   Discharge Exam: Vitals:   08/21/24 1925 08/22/24 0505  BP: (!) 108/52 126/60  Pulse: 73 62  Resp: 18 16  Temp: 98.3 F (36.8 C) 98 F (36.7 C)  SpO2:  100%    General: NAD Cardiovascular: RRR no murmurs rubs or gallops.  No JVD.  No pitting lower extremity edema.  Left lower extremity in splint. Respiratory: Clear to auscultation bilaterally.  No wheezes, no crackles, no rhonchi.  Fair air movement.  Speaking in full sentences.  Discharge Instructions   Discharge Instructions     Diet general   Complete by: As directed    Increase activity slowly   Complete by: As directed    Increase activity slowly   Complete by: As  directed    No wound care   Complete by: As directed    No wound care   Complete by: As directed       Allergies as of 08/22/2024       Reactions   Voltaren  [diclofenac  Sodium] Other (See Comments)   Unknown, pt went to pick up at pharmacy and they said she was allergic        Medication List     PAUSE taking these medications    losartan  50 MG tablet Wait to take this until your doctor or other care provider tells you to start again. Commonly known as: COZAAR  Take 1 tablet (50 mg total) by mouth daily.   triamterene -hydrochlorothiazide  37.5-25 MG capsule Wait to take this until your doctor or other care provider tells you to start again. Commonly known as: DYAZIDE  TAKE 1 CAPSULE BY MOUTH DAILY       TAKE these medications    acetaminophen  500 MG tablet Commonly known as: TYLENOL  Take 2 tablets (1,000 mg total) by mouth 3 (three) times daily for 10 days.   amLODipine  10 MG tablet Commonly known as: NORVASC  Take 1 tablet (10 mg total) by  mouth daily.   aspirin  EC 81 MG tablet Take 1 tablet (81 mg total) by mouth 2 (two) times daily for 28 days. Swallow whole.   atorvastatin  20 MG tablet Commonly known as: LIPITOR TAKE 1 TABLET BY MOUTH DAILY.   bisacodyl  5 MG EC tablet Commonly known as: DULCOLAX Take 1 tablet (5 mg total) by mouth daily.   cyanocobalamin  1000 MCG tablet Commonly known as: VITAMIN B12 Take 1,000 mcg by mouth daily.   donepezil  10 MG tablet Commonly known as: ARICEPT  Take 1 tablet (10 mg total) by mouth at bedtime.   feeding supplement Liqd Take 237 mLs by mouth 2 (two) times daily between meals.   levothyroxine  75 MCG tablet Commonly known as: SYNTHROID  TAKE 1 TABLET BY MOUTH EVERY MORNING BEFORE BREAKFAST   memantine  10 MG tablet Commonly known as: NAMENDA  Take 1 tablet (10 mg total) by mouth 2 (two) times daily.   mirtazapine  15 MG disintegrating tablet Commonly known as: REMERON  SOL-TAB DISSOLVE 1 TABLET ON TONGUE EVERY  NIGHT AT BEDTIME   ondansetron  4 MG tablet Commonly known as: ZOFRAN  Take 1 tablet (4 mg total) by mouth every 6 (six) hours as needed for nausea.   oxyCODONE  5 MG immediate release tablet Commonly known as: Oxy IR/ROXICODONE  Take 1 tablet (5 mg total) by mouth every 6 (six) hours as needed for moderate pain (pain score 4-6) or severe pain (pain score 7-10).   pantoprazole  40 MG tablet Commonly known as: PROTONIX  Take 1 tablet (40 mg total) by mouth daily at 6 (six) AM.   QUEtiapine  25 MG tablet Commonly known as: SEROQUEL  Take 0.5 tablets (12.5 mg total) by mouth at bedtime as needed (agitation).       Allergies  Allergen Reactions   Voltaren  [Diclofenac  Sodium] Other (See Comments)    Unknown, pt went to pick up at pharmacy and they said she was allergic    Contact information for follow-up providers     MD at facility Follow up.          Germaine Redbird, MD. Schedule an appointment as soon as possible for a visit in 2 week(s).   Specialty: Sports Medicine Why: Postop follow-up/wound check Contact information: [address] [place] [address] [phone]              Contact information for after-discharge care     Destination     [facility] .   Service: Skilled Nursing Contact information: [address] [place]  [address] [phone]                      The results of significant diagnostics from this hospitalization (including imaging, microbiology, ancillary and laboratory) are listed below for reference.    Significant Diagnostic Studies: DG Ankle 2 Views Left Result Date: 08/16/2024 CLINICAL DATA:  Elective surgery. EXAM: LEFT ANKLE - 2 VIEW COMPARISON:  Preoperative imaging FINDINGS: Seventeen fluoroscopic spot views of the ankle submitted from the operating room. Lateral plate and multi screw fixation of distal fibular fracture. There are 2 syndesmotic screws. Fluoroscopy time 25 seconds. Dose 0.51 mGy.  IMPRESSION: Intraoperative fluoroscopy during ORIF of distal fibular fracture. Electronically Signed   By: Andrea Gasman M.D.   On: 08/16/2024 17:54   DG C-Arm 1-60 Min-No Report Result Date: 08/16/2024 Fluoroscopy was utilized by the requesting physician.  No radiographic interpretation.   DG C-Arm 1-60 Min-No Report Result Date: 08/16/2024 Fluoroscopy was utilized by the requesting physician.  No radiographic interpretation.  US  RENAL Result Date: 08/15/2024 CLINICAL DATA:  Acute kidney injury EXAM: RENAL / URINARY TRACT ULTRASOUND COMPLETE COMPARISON:  CT abdomen and pelvis 08/03/2024 FINDINGS: Right Kidney: Renal measurements: 11.1 x 4.6 x 5.5 cm = volume: 147 mL. Echogenicity within normal limits. No mass or hydronephrosis visualized. Left Kidney: Renal measurements: 10.1 x 5.2 x 6.3 cm = volume: 171 mL. Echogenicity within normal limits. There is no hydronephrosis. There is a simple cyst in the superior pole measuring 3.0 x 2.7 x 2.9 cm. Bladder: Appears normal for degree of bladder distention. Other: None. IMPRESSION: 1. No hydronephrosis. 2. Simple cyst in the superior pole of the left kidney. Electronically Signed   By: Greig Pique M.D.   On: 08/15/2024 22:23   DG Ankle 2 Views Left Result Date: 08/15/2024 CLINICAL DATA:  Post closed reduction of left ankle fractures. EXAM: LEFT ANKLE - 2 VIEW COMPARISON:  08/15/2024 at 4:12 p.m. FINDINGS: Lateral talar subluxation has been mostly reduced. There is significant improved alignment of the distal fibular fracture now with 2-3 mm of residual lateral displacement. Ankle is supported in a fiberglass cast. IMPRESSION: Significant improved fracture and ankle joint alignment following closed reduction. Electronically Signed   By: Alm Parkins M.D.   On: 08/15/2024 18:38   DG Tibia/Fibula Left Result Date: 08/15/2024 CLINICAL DATA:  Pain. EXAM: LEFT FOOT - 2 VIEW; LEFT KNEE - COMPLETE 4+ VIEW; LEFT TIBIA AND FIBULA - 2 VIEW; LEFT ANKLE - 2  VIEW COMPARISON:  Left knee x-ray 01/15/2022 FINDINGS: Left knee: There is no evidence of fracture or dislocation. Joint spaces are well maintained. There is mild tricompartmental osteophyte formation. Soft tissues are unremarkable. Left ankle: There is an acute oblique fracture through the distal fibula at the level of the ankle mortise. The distal fracture fragment is distracted 5 mm laterally. There is abnormal widening of the medial talotibial joint space with subluxation of the distal tibia medially 5 mm. Small avulsion fracture fragments are seen between the tip of the medial malleolus in the adjacent talus. There is soft tissue swelling surrounding the ankle. Left foot: There is soft tissue swelling of the dorsal foot. There is no additional fracture or dislocation. There is mild hallux valgus. Rounded sclerotic density in the calcaneus measures 6 mm. Left tibia and fibula: There is soft tissue swelling of the lower extremity. There is no foreign body. There is no acute fracture or dislocation. No focal osseous lesion identified. Joint spaces are maintained. IMPRESSION: 1. Acute fracture through the distal fibula. 2. Medial subluxation of the distal tibia in relation to the talus. 3. Small avulsion fracture fragments between the tip of the medial malleolus and the adjacent talus. 4. Soft tissue swelling of the lower extremity and foot. 5. No additional acute fracture or dislocation of the left knee, left foot, or left tibia and fibula. Electronically Signed   By: Greig Pique M.D.   On: 08/15/2024 17:01   DG Knee Complete 4 Views Left Result Date: 08/15/2024 CLINICAL DATA:  Pain. EXAM: LEFT FOOT - 2 VIEW; LEFT KNEE - COMPLETE 4+ VIEW; LEFT TIBIA AND FIBULA - 2 VIEW; LEFT ANKLE - 2 VIEW COMPARISON:  Left knee x-ray 01/15/2022 FINDINGS: Left knee: There is no evidence of fracture or dislocation. Joint spaces are well maintained. There is mild tricompartmental osteophyte formation. Soft tissues are  unremarkable. Left ankle: There is an acute oblique fracture through the distal fibula at the level of the ankle mortise. The distal fracture fragment is distracted 5 mm  laterally. There is abnormal widening of the medial talotibial joint space with subluxation of the distal tibia medially 5 mm. Small avulsion fracture fragments are seen between the tip of the medial malleolus in the adjacent talus. There is soft tissue swelling surrounding the ankle. Left foot: There is soft tissue swelling of the dorsal foot. There is no additional fracture or dislocation. There is mild hallux valgus. Rounded sclerotic density in the calcaneus measures 6 mm. Left tibia and fibula: There is soft tissue swelling of the lower extremity. There is no foreign body. There is no acute fracture or dislocation. No focal osseous lesion identified. Joint spaces are maintained. IMPRESSION: 1. Acute fracture through the distal fibula. 2. Medial subluxation of the distal tibia in relation to the talus. 3. Small avulsion fracture fragments between the tip of the medial malleolus and the adjacent talus. 4. Soft tissue swelling of the lower extremity and foot. 5. No additional acute fracture or dislocation of the left knee, left foot, or left tibia and fibula. Electronically Signed   By: Greig Pique M.D.   On: 08/15/2024 17:01   DG Foot 2 Views Left Result Date: 08/15/2024 CLINICAL DATA:  Pain. EXAM: LEFT FOOT - 2 VIEW; LEFT KNEE - COMPLETE 4+ VIEW; LEFT TIBIA AND FIBULA - 2 VIEW; LEFT ANKLE - 2 VIEW COMPARISON:  Left knee x-ray 01/15/2022 FINDINGS: Left knee: There is no evidence of fracture or dislocation. Joint spaces are well maintained. There is mild tricompartmental osteophyte formation. Soft tissues are unremarkable. Left ankle: There is an acute oblique fracture through the distal fibula at the level of the ankle mortise. The distal fracture fragment is distracted 5 mm laterally. There is abnormal widening of the medial talotibial  joint space with subluxation of the distal tibia medially 5 mm. Small avulsion fracture fragments are seen between the tip of the medial malleolus in the adjacent talus. There is soft tissue swelling surrounding the ankle. Left foot: There is soft tissue swelling of the dorsal foot. There is no additional fracture or dislocation. There is mild hallux valgus. Rounded sclerotic density in the calcaneus measures 6 mm. Left tibia and fibula: There is soft tissue swelling of the lower extremity. There is no foreign body. There is no acute fracture or dislocation. No focal osseous lesion identified. Joint spaces are maintained. IMPRESSION: 1. Acute fracture through the distal fibula. 2. Medial subluxation of the distal tibia in relation to the talus. 3. Small avulsion fracture fragments between the tip of the medial malleolus and the adjacent talus. 4. Soft tissue swelling of the lower extremity and foot. 5. No additional acute fracture or dislocation of the left knee, left foot, or left tibia and fibula. Electronically Signed   By: Greig Pique M.D.   On: 08/15/2024 17:01   DG Ankle 2 Views Left Result Date: 08/15/2024 CLINICAL DATA:  Pain. EXAM: LEFT FOOT - 2 VIEW; LEFT KNEE - COMPLETE 4+ VIEW; LEFT TIBIA AND FIBULA - 2 VIEW; LEFT ANKLE - 2 VIEW COMPARISON:  Left knee x-ray 01/15/2022 FINDINGS: Left knee: There is no evidence of fracture or dislocation. Joint spaces are well maintained. There is mild tricompartmental osteophyte formation. Soft tissues are unremarkable. Left ankle: There is an acute oblique fracture through the distal fibula at the level of the ankle mortise. The distal fracture fragment is distracted 5 mm laterally. There is abnormal widening of the medial talotibial joint space with subluxation of the distal tibia medially 5 mm. Small avulsion fracture fragments are seen between  the tip of the medial malleolus in the adjacent talus. There is soft tissue swelling surrounding the ankle. Left foot:  There is soft tissue swelling of the dorsal foot. There is no additional fracture or dislocation. There is mild hallux valgus. Rounded sclerotic density in the calcaneus measures 6 mm. Left tibia and fibula: There is soft tissue swelling of the lower extremity. There is no foreign body. There is no acute fracture or dislocation. No focal osseous lesion identified. Joint spaces are maintained. IMPRESSION: 1. Acute fracture through the distal fibula. 2. Medial subluxation of the distal tibia in relation to the talus. 3. Small avulsion fracture fragments between the tip of the medial malleolus and the adjacent talus. 4. Soft tissue swelling of the lower extremity and foot. 5. No additional acute fracture or dislocation of the left knee, left foot, or left tibia and fibula. Electronically Signed   By: Greig Pique M.D.   On: 08/15/2024 17:01   CT ABDOMEN PELVIS WO CONTRAST Result Date: 08/03/2024 EXAM: CT ABDOMEN AND PELVIS WITHOUT CONTRAST 08/03/2024 07:48:11 PM TECHNIQUE: CT of the abdomen and pelvis was performed without the administration of intravenous contrast. Multiplanar reformatted images are provided for review. Automated exposure control, iterative reconstruction, and/or weight-based adjustment of the mA/kV was utilized to reduce the radiation dose to as low as reasonably achievable. COMPARISON: None available. CLINICAL HISTORY: elevated creatinine, decreased urine FINDINGS: LOWER CHEST: No acute abnormality. LIVER: The liver is unremarkable. GALLBLADDER AND BILE DUCTS: Gallbladder is unremarkable. No biliary ductal dilatation. SPLEEN: No acute abnormality. PANCREAS: No acute abnormality. ADRENAL GLANDS: No acute abnormality. KIDNEYS, URETERS AND BLADDER: Fluid lesion within the left kidney likely represents a simple renal cyst. Simple renal cysts do not require additional follow-up unless clinically indicated due to signs/symptoms. No stones in the kidneys or ureters. No hydronephrosis. No perinephric  or periureteral stranding. Urinary bladder is unremarkable. GI AND BOWEL: [place] demonstrates no acute abnormality. There is no bowel obstruction. No small or large bowel thickening or dilatation. Appendix is unremarkable. PERITONEUM AND RETROPERITONEUM: No ascites. No free air. VASCULATURE: Aorta is normal in caliber. LYMPH NODES: No lymphadenopathy. REPRODUCTIVE ORGANS: Status post hysterectomy. No adnexal mass. BONES AND SOFT TISSUES: No acute osseous abnormality. Intervertebral disc space vacuum phenomenon at the L5-S1 level. Disc bulge at the L3-L4, L4-L5 and L5-S1 levels. IMPRESSION: 1. No acute findings in the abdomen or pelvis is limited evaluation on this noncontrast study. . 2. Other, non-acute and/or normal findings as above. Electronically signed by: Morgane Naveau MD 08/03/2024 08:30 PM EST RP Workstation: [id number]    Microbiology: Recent Results (from the past 240 hours)  Surgical pcr screen     Status: None   Collection Time: 08/16/24 10:37 AM   Specimen: Nasal Mucosa; Nasal Swab  Result Value Ref Range Status   MRSA, PCR NEGATIVE NEGATIVE Final   Staphylococcus aureus NEGATIVE NEGATIVE Final    Comment: (NOTE) The Xpert SA Assay (FDA approved for NASAL specimens in patients 40 years of age and older), is one component of a comprehensive surveillance program. It is not intended to diagnose infection nor to guide or monitor treatment. Performed at [facility], [address], [place], [place] [address]      Labs: Basic Metabolic Panel: Recent Labs  Lab 08/15/24 1622 08/16/24 0431 08/17/24 0823 08/18/24 0458 08/19/24 0524 08/20/24 0648 08/21/24 0837  NA 140   < > 142 142 141 142 143  K 4.5   < > 4.3 3.9 4.2 4.0 4.1  CL 102   < > 109 109 108 108 110  CO2 24   < > 23 24 26 25 26   GLUCOSE 100*   < > 129* 103* 90 88 90  BUN 35*   < > 30* 32* 22 25* 15  CREATININE 3.18*   < > 2.14* 2.16* 1.51* 1.52* 1.22*  CALCIUM  9.4   < > 8.7* 8.1* 8.7* 8.5*  8.1*  MG 1.9  --   --   --   --   --   --    < > = values in this interval not displayed.   Liver Function Tests: Recent Labs  Lab 08/15/24 1622  AST 37  ALT 28  ALKPHOS 76  BILITOT 0.6  PROT 7.6  ALBUMIN  3.8   No results for input(s): LIPASE, AMYLASE in the last 168 hours. No results for input(s): AMMONIA in the last 168 hours. CBC: Recent Labs  Lab 08/15/24 1622 08/16/24 0431 08/18/24 0458 08/18/24 1908 08/19/24 0524 08/20/24 0648 08/21/24 0837  WBC 10.4   < > 8.0 9.9 6.1 6.6 6.3  NEUTROABS 7.4  --   --  5.0  --  3.9  --   HGB 9.6*   < > 7.0* 11.0* 10.4* 9.9* 10.6*  HCT 30.2*   < > 22.4* 33.6* 32.7* 31.2* 33.7*  MCV 89.3   < > 90.0 85.7 87.9 86.9 89.6  PLT 428*   < > 392 427* 377 396 403*   < > = values in this interval not displayed.   Cardiac Enzymes: No results for input(s): CKTOTAL, CKMB, CKMBINDEX, TROPONINI in the last 168 hours. BNP: BNP (last 3 results) No results for input(s): BNP in the last 8760 hours.  ProBNP (last 3 results) Recent Labs    03/15/24 1620  PROBNP 266.0    CBG: Recent Labs  Lab 08/16/24 0429  GLUCAP 100*       Signed:  Toribio Hummer MD.  [facility] 08/22/2024, 11:32 AM

## 2024-08-22 NOTE — TOC Transition Note (Addendum)
 Transition of Care [facility]) - Discharge Note   Patient Details  Name: Debbie Brandt MRN: [id number] Date of Birth: 03/03/1961  Transition of Care [facility]) CM/SW Contact:  Sonda Manuella Quill, RN Phone Number: 08/22/2024, 11:34 AM   Clinical Narrative:    D/C orders received; pt assigned to [facility] RM  # [id number], call report # [phone];  transport by PTAR; LVM for pt's brother Debbie Brandt [phone]); awaiting return call for notification;D/C summary and SNF transfer report sent via SNF hub; PTAR called for transport at 1137; spoke w/ operator # [id number]; no IP CM needs.  -[phone]- return call from pt's brother Debbie Brandt; he was given notification of d/c plan and agreed; no IP CM needs. Final next level of care: Skilled Nursing Facility Barriers to Discharge: No Barriers Identified   Patient Goals and CMS Choice Patient states their goals for this hospitalization and ongoing recovery are:: To return home CMS Medicare.gov Compare Post Acute Care list provided to:: Patient Represenative (must comment) Choice offered to / list presented to : Sibling [place] ownership interest in [facility].provided to:: Sibling    Discharge Placement              Patient chooses bed at: [facility] Patient to be transferred to facility by: [facility] Name of family member notified: LVM for Debbie Brandt (brother) [phone] Patient and family notified of of transfer: 08/22/24  Discharge Plan and Services Additional resources added to the After Visit Summary for   In-house Referral: Clinical Social Work Discharge Planning Services: NA Post Acute Care Choice: NA          DME Arranged: N/A DME Agency: NA       HH Arranged: NA HH Agency: NA        Social Drivers of Health (SDOH) Interventions SDOH Screenings   Food Insecurity: No Food Insecurity (08/16/2024)  Housing: Low Risk  (08/16/2024)  Transportation Needs: No Transportation Needs (08/16/2024)  Utilities: Not At Risk (08/16/2024)   Depression (PHQ2-9): Low Risk  (08/03/2024)  Physical Activity: Inactive (08/03/2024)  Social Connections: Unknown (08/16/2024)  Recent Concern: Social Connections - Socially Isolated (08/03/2024)  Stress: No Stress Concern Present (08/03/2024)  Tobacco Use: Low Risk  (08/15/2024)  Health Literacy: Adequate Health Literacy (08/03/2024)     Readmission Risk Interventions    08/16/2024    3:57 PM  Readmission Risk Prevention Plan  Transportation Screening Complete  PCP or Specialist Appt within 5-7 Days Complete  Home Care Screening Complete  Medication Review (RN CM) Complete

## 2024-08-22 NOTE — Plan of Care (Signed)

## 2024-08-22 NOTE — Plan of Care (Signed)
   Problem: Activity: Goal: Risk for activity intolerance will decrease Outcome: Not Progressing

## 2024-08-22 NOTE — TOC Progression Note (Addendum)
 Transition of Care [facility]) - Progression Note    Patient Details  Name: Debbie Brandt MRN: [id number] Date of Birth: October 08, 1960  Transition of Care [facility]) CM/SW Contact  Sonda Manuella Quill, RN Phone Number: 08/22/2024, 9:43 AM  Clinical Narrative:    Notified by Brittany, Admissions at [facility] pt can admit today; she gave RM # [id number], call report # [phone]; Dr Sebastian and Lauraine, LPN notified.  [phone]- notified by Brittany that pt cannot admit today d/t mechanical issues in assigned room; she said pt can admit tomorrow; will pass on to oncoming IP CM tomorrow.  [phone]- notified by Brittany pt can admit to RM # 405; Dr Sebastian notified via secure chat; awaiting updated D/C summary  Expected Discharge Plan: Home/Self Care Barriers to Discharge: Continued Medical Work up               Expected Discharge Plan and Services In-house Referral: Clinical Social Work Discharge Planning Services: NA Post Acute Care Choice: NA Living arrangements for the past 2 months: Single Family Home Expected Discharge Date: 08/20/24               DME Arranged: N/A DME Agency: NA       HH Arranged: NA HH Agency: NA         Social Drivers of Health (SDOH) Interventions SDOH Screenings   Food Insecurity: No Food Insecurity (08/16/2024)  Housing: Low Risk  (08/16/2024)  Transportation Needs: No Transportation Needs (08/16/2024)  Utilities: Not At Risk (08/16/2024)  Depression (PHQ2-9): Low Risk  (08/03/2024)  Physical Activity: Inactive (08/03/2024)  Social Connections: Unknown (08/16/2024)  Recent Concern: Social Connections - Socially Isolated (08/03/2024)  Stress: No Stress Concern Present (08/03/2024)  Tobacco Use: Low Risk  (08/15/2024)  Health Literacy: Adequate Health Literacy (08/03/2024)    Readmission Risk Interventions    08/16/2024    3:57 PM  Readmission Risk Prevention Plan  Transportation Screening Complete  PCP or Specialist Appt within 5-7 Days Complete   Home Care Screening Complete  Medication Review (RN CM) Complete

## 2024-08-22 NOTE — Progress Notes (Signed)
 PROGRESS NOTE    Debbie Brandt  FMW:[id number] DOB: 1961/03/07 DOA: 08/15/2024 PCP: Lendia Boby LITTIE, NP-C    Chief Complaint  Patient presents with   Foot Pain    Brief Narrative:  Patient lives at home with her brother and is dependent on him for all ADLs due to dementia. This is a 63 year old female with history of dementia hypertension hyperlipidemia hypothyroidism SBO depression GERD came to the ER for complaints of left foot pain and is found to have acute fracture through the distal fibula and medial subluxation of the distal tibia in relation to the talus with soft tissue swelling of the left lower extremity and foot. Denies falls or trauma. At baseline she ambulates without an assistive device.    Assessment & Plan:   Principal Problem:   AKI (acute kidney injury) Active Problems:   Essential hypertension   Closed fracture of left ankle   Severe Alzheimer's dementia (HCC)   Insomnia   Acute postoperative anemia due to expected blood loss   Anemia of chronic disease  #1 acute distal fibular fracture/left foot fracture -Status post ORIF on 08/16/2024 per orthopedics. - Per orthopedics patient NWB to left lower extremity, keep splint clean and dry at all times, elevate, aspirin  81 mg twice daily x 4 weeks for DVT prophylaxis. - Patient with history of dementia at baseline walks around without any assistive device. - Continue current pain control with Tylenol  and oxycodone . - Concern patient may have been orthostatic causing her fall as patient on presentation has soft blood pressure noted to be on triamterene  HCTZ and Cozaar  at home. - Patient seen by PT/OT recommending SNF placement. - Outpatient follow-up with orthopedics.  2.  AKI -Baseline creatinine approximately 0.9-1.1. - Patient on admission noted to have a creatinine of 3.18. - Likely secondary to a prerenal azotemia in the setting of HCTZ and Cozaar . -Creatinine currently at 1.22 from 1.51 from 2.16 from  3.18 on admission. -Creatinine noted at 1.87 on day of discharge, 08/05/2024, during last hospitalization. - Improved with hydration. -Saline lock IV fluids. - Continue to hold HCTZ, Cozaar .   - Follow.  3.  Postop acute blood loss anemia/anemia -Patient noted with a hemoglobin of 7.0 (08/18/2024) from 9.6 on admission. - Anemia panel with iron level of 25, TIBC of 161, ferritin of 576. - Status post transfusion 2 units PRBCs hemoglobin currently at 10.6. - Follow H&H.  4.  Hypertension -Continue to hold triamterene  HCTZ. - Hold Cozaar  due to AKI and likely will not resume on discharge. - Continue Norvasc  10 mg daily.    - Hydralazine  as needed.   5.  Hyperlipidemia - Continue statin.    6.  Hypothyroidism - Synthroid   7.  Dementia -Patient noted to be mostly nonverbal/very soft-spoken.. - Continue Aricept , Namenda . - Patient noted to have sundowning every evening as patient tries to climb out of bed and ripping out IVs and as such patient started on Seroquel  12.5 mg nightly. -Due to increased drowsiness, Seroquel  was changed to as needed with improvement with drowsiness.  - Follow.   DVT prophylaxis: Aspirin  81 mg twice daily. Code Status: Full Family Communication: No family at bedside. Disposition: Patient medically stable.  Awaiting SNF placement.  Status is: Inpatient Remains inpatient appropriate because: Severity of illness   Consultants:  Orthopedics: Dr. Germaine 08/16/2024  Procedures:  Plan films of the left ankle 08/16/2024 ORIF left distal fibular fracture/ORIF of left ankle syndesmosis 08/16/2024 per Dr. Germaine Transfuse 2 units PRBCs 08/18/2024  Antimicrobials:  Anti-infectives (From admission, onward)    Start     Dose/Rate Route Frequency Ordered Stop   08/16/24 2200  ceFAZolin  (ANCEF ) IVPB 2g/100 mL premix        2 g 200 mL/hr over 30 Minutes Intravenous Every 8 hours 08/16/24 1658 08/17/24 0822   08/16/24 1516  vancomycin  (VANCOCIN ) powder   Status:  Discontinued          As needed 08/16/24 1516 08/16/24 1557   08/16/24 0600  ceFAZolin  (ANCEF ) IVPB 2g/100 mL premix        2 g 200 mL/hr over 30 Minutes Intravenous On call to O.R. 08/16/24 0514 08/16/24 1415         Subjective: Patient laying in bed.  Alert.  Soft-spoken.  Denies any chest pain or shortness of breath.  No abdominal pain.  Sitter at bedside.  Objective: Vitals:   08/20/24 1956 08/21/24 1243 08/21/24 1925 08/22/24 0505  BP: (!) 115/57 (!) 115/52 (!) 108/52 126/60  Pulse: 63 65 73 62  Resp: 18 16 18 16   Temp: 98.3 F (36.8 C) 98.3 F (36.8 C) 98.3 F (36.8 C) 98 F (36.7 C)  TempSrc: Oral Oral Oral Oral  SpO2: 100% 99%  100%  Weight:      Height:        Intake/Output Summary (Last 24 hours) at 08/22/2024 1120 Last data filed at 08/22/2024 1000 Gross per 24 hour  Intake 1080 ml  Output --  Net 1080 ml   Filed Weights   08/15/24 1532 08/15/24 2025  Weight: 70.8 kg 69.1 kg    Examination:  General exam: NAD. Respiratory system: Lungs clear to auscultation bilaterally.  No wheezes, no crackles, no rhonchi.  Fair air movement.  No use of accessory muscles of respiration. Cardiovascular system: Regular rate rhythm no murmurs rubs or gallops.  No JVD.  No lower extremity edema. Gastrointestinal system: Abdomen is soft, nontender, nondistended, positive bowel sounds.  No rebound.  No guarding.  Central nervous system: Alert and oriented.  Moving extremities spontaneously.  No focal neurological deficits.  Extremities: Left lower extremity in postop bandage/splint.  Skin: No rashes, lesions or ulcers Psychiatry: Judgement and insight unable to assess. Mood & affect appropriate.     Data Reviewed: I have personally reviewed following labs and imaging studies  CBC: Recent Labs  Lab 08/15/24 1622 08/16/24 0431 08/18/24 0458 08/18/24 1908 08/19/24 0524 08/20/24 0648 08/21/24 0837  WBC 10.4   < > 8.0 9.9 6.1 6.6 6.3  NEUTROABS 7.4  --   --   5.0  --  3.9  --   HGB 9.6*   < > 7.0* 11.0* 10.4* 9.9* 10.6*  HCT 30.2*   < > 22.4* 33.6* 32.7* 31.2* 33.7*  MCV 89.3   < > 90.0 85.7 87.9 86.9 89.6  PLT 428*   < > 392 427* 377 396 403*   < > = values in this interval not displayed.    Basic Metabolic Panel: Recent Labs  Lab 08/15/24 1622 08/16/24 0431 08/17/24 0823 08/18/24 0458 08/19/24 0524 08/20/24 0648 08/21/24 0837  NA 140   < > 142 142 141 142 143  K 4.5   < > 4.3 3.9 4.2 4.0 4.1  CL 102   < > 109 109 108 108 110  CO2 24   < > 23 24 26 25 26   GLUCOSE 100*   < > 129* 103* 90 88 90  BUN 35*   < > 30* 32* 22  25* 15  CREATININE 3.18*   < > 2.14* 2.16* 1.51* 1.52* 1.22*  CALCIUM  9.4   < > 8.7* 8.1* 8.7* 8.5* 8.1*  MG 1.9  --   --   --   --   --   --    < > = values in this interval not displayed.    GFR: Estimated Creatinine Clearance: 43.9 mL/min (A) (by C-G formula based on SCr of 1.22 mg/dL (H)).  Liver Function Tests: Recent Labs  Lab 08/15/24 1622  AST 37  ALT 28  ALKPHOS 76  BILITOT 0.6  PROT 7.6  ALBUMIN  3.8    CBG: Recent Labs  Lab 08/16/24 0429  GLUCAP 100*     Recent Results (from the past 240 hours)  Surgical pcr screen     Status: None   Collection Time: 08/16/24 10:37 AM   Specimen: Nasal Mucosa; Nasal Swab  Result Value Ref Range Status   MRSA, PCR NEGATIVE NEGATIVE Final   Staphylococcus aureus NEGATIVE NEGATIVE Final    Comment: (NOTE) The Xpert SA Assay (FDA approved for NASAL specimens in patients 49 years of age and older), is one component of a comprehensive surveillance program. It is not intended to diagnose infection nor to guide or monitor treatment. Performed at [facility], [address], [place], [place] [address]          Radiology Studies: No results found.       Scheduled Meds:  acetaminophen   1,000 mg Oral TID   amLODipine   10 mg Oral Daily   aspirin  EC  81 mg Oral BID   atorvastatin   20 mg Oral Daily   bisacodyl   5 mg Oral  Daily   cyanocobalamin   1,000 mcg Oral Daily   donepezil   10 mg Oral QHS   feeding supplement  237 mL Oral BID BM   levothyroxine   75 mcg Oral Q0600   memantine   10 mg Oral BID   mirtazapine   15 mg Oral QHS   pantoprazole   40 mg Oral Q0600   Continuous Infusions:   LOS: 7 days    Time spent: 35 minutes    Toribio Hummer, MD [facility]   To contact the attending provider between 7A-7P or the covering provider during after hours 7P-7A, please log into the web site [email or web address] and access using universal  password for that web site. If you do not have the password, please call the hospital operator.  08/22/2024, 11:20 AM

## 2024-08-27 ENCOUNTER — Telehealth: Payer: Self-pay

## 2024-08-27 NOTE — Telephone Encounter (Signed)
 Copied from CRM #[id number]. Topic: General - Other >> Aug 27, 2024  2:27 PM Deaijah H wrote: Reason for CRM: Patient called in wanting to speak with someone regarding getting patient some help w/ taking care of her and would like to know how to go about that. Please call [phone]

## 2024-08-30 NOTE — Telephone Encounter (Signed)
 Spoke w brother and pt is currently at rehab facility, advised he will need to speak to provider there regarding this as they are under contract w pt at this time. He can let us  know if they are unable to help, and if not they will have to wait until she is discharged for us  to be able to place any orders. Brother states he will try and speak to rehab facility in regards to this.

## 2024-09-07 ENCOUNTER — Telehealth: Payer: Self-pay

## 2024-09-07 NOTE — Transitions of Care (Post Inpatient/ED Visit) (Signed)
" ° °  09/07/2024  Name: Debbie Brandt MRN: [id number] DOB: [birth year]  Today's TOC FU Call Status: Today's TOC FU Call Status:: Unsuccessful Call (1st Attempt) Unsuccessful Call (1st Attempt) Date: 09/07/24  Attempted to reach the patient regarding the most recent Inpatient/ED visit.  Follow Up Plan: Additional outreach attempts will be made to reach the patient to complete the Transitions of Care (Post Inpatient/ED visit) call.   Signature Julian Lemmings, LPN [facility] Nurse Health Advisor Direct Dial [phone]  "

## 2024-09-07 NOTE — Transitions of Care (Post Inpatient/ED Visit) (Signed)
 "  09/07/2024  Name: Debbie Brandt MRN: [id number] DOB: March 31, 1961  Today's TOC FU Call Status: Today's TOC FU Call Status:: Successful TOC FU Call Completed Unsuccessful Call (1st Attempt) Date: 09/07/24 [facility] FU Call Complete Date: 09/07/24  Patient's Name and Date of Birth confirmed. Name, DOB  Transition Care Management Follow-up Telephone Call Date of Discharge: 09/06/24 Discharge Facility: Other [facility]) Name of Other ([facility]) Discharge Facility: [facility] Type of Discharge: Inpatient Admission Primary Inpatient Discharge Diagnosis:: fracture left ankle How have you been since you were released from the hospital?: Better Any questions or concerns?: No  Items Reviewed: Did you receive and understand the discharge instructions provided?: Yes Medications obtained,verified, and reconciled?: Yes (Medications Reviewed) Any new allergies since your discharge?: No Dietary orders reviewed?: Yes Do you have support at home?: Yes People in Home [RPT]: spouse  Medications Reviewed Today: Medications Reviewed Today     Reviewed by Emmitt Pan, LPN (Licensed Practical Nurse) on 09/07/24 at 0902  Med List Status: <None>   Medication Order Taking? Sig Documenting Provider Last Dose Status Informant  amLODipine  (NORVASC ) 10 MG tablet [id number] Yes Take 1 tablet (10 mg total) by mouth daily. Sebastian Toribio GAILS, MD  Active   aspirin  EC 81 MG tablet [id number] Yes Take 1 tablet (81 mg total) by mouth 2 (two) times daily for 28 days. Swallow whole. Sebastian Toribio GAILS, MD  Active   atorvastatin  (LIPITOR) 20 MG tablet [id number] Yes TAKE 1 TABLET BY MOUTH DAILY. Lendia Boby LITTIE, NP-C  Active Spouse/Significant Other, Pharmacy Records  bisacodyl  (DULCOLAX) 5 MG EC tablet [id number] Yes Take 1 tablet (5 mg total) by mouth daily. Sebastian Toribio GAILS, MD  Active   cyanocobalamin  (VITAMIN B12) 1000 MCG tablet [id number] Yes Take 1,000 mcg by mouth daily. [provider]   Active Spouse/Significant Other, Pharmacy Records  donepezil  (ARICEPT ) 10 MG tablet [id number] Yes Take 1 tablet (10 mg total) by mouth at bedtime. Lomax, Amy, NP  Active Spouse/Significant Other, Pharmacy Records  feeding supplement (ENSURE PLUS HIGH PROTEIN) LIQD [id number]  Take 237 mLs by mouth 2 (two) times daily between meals.  Patient not taking: Reported on 09/07/2024   Barbarann Nest, MD  Active Spouse/Significant Other, Pharmacy Records  levothyroxine  (SYNTHROID ) 75 MCG tablet [id number] Yes TAKE 1 TABLET BY MOUTH EVERY MORNING BEFORE BREAKFAST Henson, Vickie L, NP-C  Active Spouse/Significant Other, Pharmacy Records  losartan  (COZAAR ) 50 MG tablet [id number]  Take 1 tablet (50 mg total) by mouth daily.  Patient not taking: Reported on 09/07/2024   Lendia Boby LITTIE, NP-C  Active Spouse/Significant Other, Pharmacy Records  memantine  (NAMENDA ) 10 MG tablet [id number] Yes Take 1 tablet (10 mg total) by mouth 2 (two) times daily. Lomax, Amy, NP  Active Spouse/Significant Other, Pharmacy Records  mirtazapine  (REMERON  SOL-TAB) 15 MG disintegrating tablet [id number] Yes DISSOLVE 1 TABLET ON TONGUE EVERY NIGHT AT BEDTIME Henson, Vickie L, NP-C  Active Spouse/Significant Other, Pharmacy Records  ondansetron  (ZOFRAN ) 4 MG tablet [id number] Yes Take 1 tablet (4 mg total) by mouth every 6 (six) hours as needed for nausea. Sebastian Toribio GAILS, MD  Active   oxyCODONE  (OXY IR/ROXICODONE ) 5 MG immediate release tablet [id number] Yes Take 1 tablet (5 mg total) by mouth every 6 (six) hours as needed for moderate pain (pain score 4-6) or severe pain (pain score 7-10). Sebastian Toribio GAILS, MD  Active   pantoprazole  (PROTONIX ) 40 MG tablet [id number] Yes Take 1 tablet (40 mg total) by mouth daily at 6 (six) AM. Sebastian Toribio  V, MD  Active   QUEtiapine  (SEROQUEL ) 25 MG tablet [id number] Yes Take 0.5 tablets (12.5 mg total) by mouth at bedtime as needed (agitation). Sebastian Toribio GAILS, MD  Active    triamterene -hydrochlorothiazide  (DYAZIDE ) 37.5-25 MG capsule [id number]  TAKE 1 CAPSULE BY MOUTH DAILY  Patient not taking: Reported on 09/07/2024   Lendia Boby CROME, NP-C  Active Spouse/Significant Other, Pharmacy Records           Med Note RENNIS, DUROJAHYE' R   Mon Aug 16, 2024  6:06 AM) Patiens spouse states that he has been giver her this medication. I informed him of the Hold status  Med List Note Lorne Been, CPhT 08/04/24 9050): Husband handles medications. [id number]            Home Care and Equipment/Supplies: Were Home Health Services Ordered?: NA Any new equipment or medical supplies ordered?: NA  Functional Questionnaire: Do you need assistance with bathing/showering or dressing?: Yes Do you need assistance with meal preparation?: Yes Do you need assistance with eating?: No Do you have difficulty maintaining continence: No Do you need assistance with getting out of bed/getting out of a chair/moving?: No Do you have difficulty managing or taking your medications?: No  Follow up appointments reviewed: PCP Follow-up appointment confirmed?: Yes Date of PCP follow-up appointment?: 09/17/24 Follow-up Provider: [facility] Follow-up appointment confirmed?: NA Do you need transportation to your follow-up appointment?: No Do you understand care options if your condition(s) worsen?: Yes-patient verbalized understanding    SIGNATURE Julian Lemmings, LPN [facility] Nurse Health Advisor Direct Dial [phone]  "

## 2024-09-17 ENCOUNTER — Ambulatory Visit: Admitting: Family Medicine

## 2024-09-17 ENCOUNTER — Ambulatory Visit: Payer: Self-pay | Admitting: Family Medicine

## 2024-09-17 ENCOUNTER — Encounter: Payer: Self-pay | Admitting: Family Medicine

## 2024-09-17 VITALS — BP 114/66 | HR 63 | Temp 97.6°F | Ht 64.0 in

## 2024-09-17 DIAGNOSIS — E039 Hypothyroidism, unspecified: Secondary | ICD-10-CM

## 2024-09-17 DIAGNOSIS — R7303 Prediabetes: Secondary | ICD-10-CM

## 2024-09-17 DIAGNOSIS — I10 Essential (primary) hypertension: Secondary | ICD-10-CM | POA: Diagnosis not present

## 2024-09-17 DIAGNOSIS — F03C0 Unspecified dementia, severe, without behavioral disturbance, psychotic disturbance, mood disturbance, and anxiety: Secondary | ICD-10-CM | POA: Diagnosis not present

## 2024-09-17 DIAGNOSIS — E876 Hypokalemia: Secondary | ICD-10-CM | POA: Diagnosis not present

## 2024-09-17 DIAGNOSIS — N179 Acute kidney failure, unspecified: Secondary | ICD-10-CM

## 2024-09-17 LAB — BASIC METABOLIC PANEL WITH GFR
BUN: 28 mg/dL — ABNORMAL HIGH (ref 6–23)
CO2: 28 meq/L (ref 19–32)
Calcium: 9.6 mg/dL (ref 8.4–10.5)
Chloride: 101 meq/L (ref 96–112)
Creatinine, Ser: 1.32 mg/dL — ABNORMAL HIGH (ref 0.40–1.20)
GFR: 42.29 mL/min — ABNORMAL LOW
Glucose, Bld: 86 mg/dL (ref 70–99)
Potassium: 4 meq/L (ref 3.5–5.1)
Sodium: 138 meq/L (ref 135–145)

## 2024-09-17 LAB — TSH: TSH: 1.43 u[IU]/mL (ref 0.35–5.50)

## 2024-09-17 LAB — CBC
HCT: 38.4 % (ref 36.0–46.0)
Hemoglobin: 12.6 g/dL (ref 12.0–15.0)
MCHC: 32.8 g/dL (ref 30.0–36.0)
MCV: 86 fl (ref 78.0–100.0)
Platelets: 378 K/uL (ref 150.0–400.0)
RBC: 4.47 Mil/uL (ref 3.87–5.11)
RDW: 14.3 % (ref 11.5–15.5)
WBC: 5.1 K/uL (ref 4.0–10.5)

## 2024-09-17 LAB — HEMOGLOBIN A1C: Hgb A1c MFr Bld: 5.4 % (ref 4.6–6.5)

## 2024-09-17 NOTE — Progress Notes (Signed)
 Her labs are stable. No changes needed beyond what we discussed at her visit.

## 2024-09-17 NOTE — Progress Notes (Signed)
 "  Subjective:     Patient ID: Debbie Brandt, female    DOB: May 06, 1961, 64 y.o.   MRN: [id number]  Chief Complaint  Patient presents with   Hospitalization Follow-up    HPI  Discussed the use of AI scribe software for clinical note transcription with the patient, who gave verbal consent to proceed.  History of Present Illness Debbie Brandt is a 64 year old female with essential hypertension and severe Alzheimer's dementia who presents for follow-up on chronic health conditions. She is accompanied by her brother, who is her caregiver.  Cognitive impairment and communication - Severe Alzheimer's dementia with significant cognitive decline - Mostly nonverbal and does not express pain  Hypertension and medication management - Essential hypertension managed with Norvasc  10 mg daily - Recent acute kidney injury attributed to HCTZ and Cozaar  use; creatinine improved from 3.18 to 1.52 during hospitalization - HCTZ and Cozaar  discontinued after hospitalization  Acute kidney injury - Hospitalized on August 15, 2024, for acute kidney injury secondary to antihypertensive medications - Creatinine improved from 3.18 on admission to 1.52 by discharge  Left ankle fracture - Closed fracture of the left ankle - Discharged with a boot for immobilization - Follow-up orthopedic appointment scheduled for October 08, 2024  Mobility and dvt prophylaxis - Reduced mobility following left ankle fracture - Aspirin  recommended for DVT prevention but not currently taken per brother   Pain management - Prescribed oxycodone  for pain control - Does not express pain due to cognitive impairment  Sleep disturbance - Discharged with Seroquel  as needed for insomnia - Caregiver has not received Seroquel   Hydration and nutrition - No significant change in eating or drinking - Drinks less than before - Caregiver encouraged to maintain adequate fluid intake to prevent dehydration  Respiratory and  infectious symptoms - No cough, dyspnea, or fever  Other medications - Currently taking Synthroid  and a statin      Health Maintenance Due  Topic Date Due   Zoster Vaccines- Shingrix (1 of 2) 12/03/2008   Mammogram  05/08/2014   Bone Density Scan  Never done   COVID-19 Vaccine (3 - 2025-26 season) 05/17/2024   Cervical Cancer Screening (HPV/Pap Cotest)  07/29/2024    Past Medical History:  Diagnosis Date   Chest pain 09/26/2015   Depression    Essential hypertension 09/26/2015   GERD (gastroesophageal reflux disease)    Hypertension    Hypothyroidism 09/26/2015   Murmur 09/26/2015   Thyroid  disease     Past Surgical History:  Procedure Laterality Date   ABDOMINAL HYSTERECTOMY     CESAREAN SECTION     LAPAROSCOPIC ABDOMINAL EXPLORATION N/A 11/21/2017   Procedure: LAPAROSCOPIC ABDOMINAL EXPLORATION LYSIS OF ADHESIONS;  Surgeon: Aron Shoulders, MD;  Location: [facility];  Service: General;  Laterality: N/A;   LAPAROTOMY N/A 10/28/2016   Procedure: EXPLORATORY LAPAROTOMY WITH  LYSIS OF ADHESIONS;  Surgeon: Krystal Russell, MD;  Location: [facility];  Service: General;  Laterality: N/A;   ORIF ANKLE FRACTURE Left 08/16/2024   Procedure: LEFT ANKLE OPEN REDUCTION INTERNAL FIXATION;  Surgeon: Germaine Redbird, MD;  Location: [facility];  Service: Orthopedics;  Laterality: Left;    Family History  Problem Relation Age of Onset   Cancer Mother        lung cancer   Hypertension Mother    Cancer Father    Heart attack Father    Mental illness Sister    Hypertension Sister    Hyperlipidemia Sister    Dementia Sister  Diabetes Brother    Hypertension Brother     Social History   Socioeconomic History   Marital status: Widowed    Spouse name: Not on file   Number of children: Not on file   Years of education: Not on file   Highest education level: Not on file  Occupational History   Not on file  Tobacco Use   Smoking status: Never   Smokeless tobacco: Never  Vaping Use   Vaping  status: Never Used  Substance and Sexual Activity   Alcohol use: No    Alcohol/week: 0.0 standard drinks of alcohol   Drug use: No   Sexual activity: Not Currently    Comment: 1st intercourse- 15, partners3, widow  Other Topics Concern   Not on file  Social History Narrative   Epworth Sleepiness Scale = 6 (as of 09/26/2015)   Social Drivers of Health   Tobacco Use: Low Risk (09/17/2024)   Patient History    Smoking Tobacco Use: Never    Smokeless Tobacco Use: Never    Passive Exposure: Not on file  Financial Resource Strain: Not on file  Food Insecurity: No Food Insecurity (08/16/2024)   Epic    Worried About Programme Researcher, Broadcasting/film/video in the Last Year: Never true    Ran Out of Food in the Last Year: Never true  Transportation Needs: No Transportation Needs (08/16/2024)   Epic    Lack of Transportation (Medical): No    Lack of Transportation (Non-Medical): No  Physical Activity: Inactive (08/03/2024)   Exercise Vital Sign    Days of Exercise per Week: 0 days    Minutes of Exercise per Session: 0 min  Stress: No Stress Concern Present (08/03/2024)   [employer] of Occupational Health - Occupational Stress Questionnaire    Feeling of Stress: Not at all  Social Connections: Unknown (08/16/2024)   Social Connection and Isolation Panel    Frequency of Communication with Friends and Family: Patient unable to answer    Frequency of Social Gatherings with Friends and Family: More than three times a week    Attends Religious Services: More than 4 times per year    Active Member of Clubs or Organizations: Patient unable to answer    Attends Banker Meetings: Patient unable to answer    Marital Status: Widowed  Recent Concern: Social Connections - Socially Isolated (08/03/2024)   Social Connection and Isolation Panel    Frequency of Communication with Friends and Family: Once a week    Frequency of Social Gatherings with Friends and Family: Never    Attends Religious  Services: Never    Database Administrator or Organizations: No    Attends Banker Meetings: Never    Marital Status: Widowed  Intimate Partner Violence: Patient Unable To Answer (08/16/2024)   Epic    Fear of Current or Ex-Partner: Patient unable to answer    Emotionally Abused: Patient unable to answer    Physically Abused: Patient unable to answer    Sexually Abused: Patient unable to answer  Depression (PHQ2-9): Low Risk (08/03/2024)   Depression (PHQ2-9)    PHQ-2 Score: 4  Alcohol Screen: Not on file  Housing: Low Risk (08/16/2024)   Epic    Unable to Pay for Housing in the Last Year: No    Number of Times Moved in the Last Year: 0    Homeless in the Last Year: No  Utilities: Not At Risk (08/16/2024)   Epic  Threatened with loss of utilities: No  Health Literacy: Adequate Health Literacy (08/03/2024)   B1300 Health Literacy    Frequency of need for help with medical instructions: Never    Outpatient Medications Prior to Visit  Medication Sig Dispense Refill   amLODipine  (NORVASC ) 10 MG tablet Take 1 tablet (10 mg total) by mouth daily.     atorvastatin  (LIPITOR) 20 MG tablet TAKE 1 TABLET BY MOUTH DAILY. 90 tablet 1   bisacodyl  (DULCOLAX) 5 MG EC tablet Take 1 tablet (5 mg total) by mouth daily.     cyanocobalamin  (VITAMIN B12) 1000 MCG tablet Take 1,000 mcg by mouth daily.     donepezil  (ARICEPT ) 10 MG tablet Take 1 tablet (10 mg total) by mouth at bedtime. 90 tablet 3   feeding supplement (ENSURE PLUS HIGH PROTEIN) LIQD Take 237 mLs by mouth 2 (two) times daily between meals. 14220 mL 0   levothyroxine  (SYNTHROID ) 75 MCG tablet TAKE 1 TABLET BY MOUTH EVERY MORNING BEFORE BREAKFAST 90 tablet 1   losartan  (COZAAR ) 50 MG tablet Take 1 tablet (50 mg total) by mouth daily. 90 tablet 1   memantine  (NAMENDA ) 10 MG tablet Take 1 tablet (10 mg total) by mouth 2 (two) times daily. 180 tablet 3   mirtazapine  (REMERON  SOL-TAB) 15 MG disintegrating tablet DISSOLVE 1 TABLET  ON TONGUE EVERY NIGHT AT BEDTIME 90 tablet 1   ondansetron  (ZOFRAN ) 4 MG tablet Take 1 tablet (4 mg total) by mouth every 6 (six) hours as needed for nausea. 20 tablet 0   oxyCODONE  (OXY IR/ROXICODONE ) 5 MG immediate release tablet Take 1 tablet (5 mg total) by mouth every 6 (six) hours as needed for moderate pain (pain score 4-6) or severe pain (pain score 7-10). 10 tablet 0   pantoprazole  (PROTONIX ) 40 MG tablet Take 1 tablet (40 mg total) by mouth daily at 6 (six) AM.     QUEtiapine  (SEROQUEL ) 25 MG tablet Take 0.5 tablets (12.5 mg total) by mouth at bedtime as needed (agitation). 10 tablet 0   triamterene -hydrochlorothiazide  (DYAZIDE ) 37.5-25 MG capsule TAKE 1 CAPSULE BY MOUTH DAILY 90 capsule 0   aspirin  EC 81 MG tablet Take 1 tablet (81 mg total) by mouth 2 (two) times daily for 28 days. Swallow whole. (Patient not taking: Reported on 09/17/2024)     No facility-administered medications prior to visit.    Allergies[1]  Review of Systems  Reason unable to perform ROS: due to mental status.       Objective:    Physical Exam Constitutional:      General: She is not in acute distress.    Appearance: She is not ill-appearing.  HENT:     Mouth/Throat:     Mouth: Mucous membranes are moist.     Pharynx: Oropharynx is clear.  Eyes:     Extraocular Movements: Extraocular movements intact.     Conjunctiva/sclera: Conjunctivae normal.  Cardiovascular:     Rate and Rhythm: Normal rate and regular rhythm.  Pulmonary:     Effort: Pulmonary effort is normal.     Breath sounds: Normal breath sounds.  Musculoskeletal:     Cervical back: Normal range of motion and neck supple.     Right lower leg: No edema.  Skin:    General: Skin is warm and dry.  Neurological:     General: No focal deficit present.     Mental Status: She is alert. Mental status is at baseline.     Comments: Sitting in wheelchair. Tall boot  on left foot      BP 114/66   Pulse 63   Temp 97.6 F (36.4 C)  (Temporal)   Ht 5' 4 (1.626 m)   SpO2 99%   BMI 26.15 kg/m  Wt Readings from Last 3 Encounters:  08/15/24 152 lb 5.4 oz (69.1 kg)  08/03/24 156 lb 15.5 oz (71.2 kg)  08/03/24 157 lb (71.2 kg)       Assessment & Plan:   Problem List Items Addressed This Visit     Hypothyroidism (Chronic)   Relevant Orders   TSH (Completed)   AKI (acute kidney injury)   Relevant Orders   Basic metabolic panel with GFR (Completed)   TSH (Completed)   CBC (Completed)   Hypokalemia   Relevant Orders   Basic metabolic panel with GFR (Completed)   Other Visit Diagnoses       Primary hypertension    -  Primary   Relevant Orders   Basic metabolic panel with GFR (Completed)   CBC (Completed)     Severe dementia without behavioral disturbance, psychotic disturbance, mood disturbance, or anxiety, unspecified dementia type (HCC)         Prediabetes       Relevant Orders   Basic metabolic panel with GFR (Completed)   Hemoglobin A1c (Completed)   CBC (Completed)       Assessment and Plan Assessment & Plan Closed fracture of left ankle with orthopedic hardware Closed fracture of the left ankle with orthopedic hardware in place. Boot in place.  She is non-weight bearing as much as possible. - Continue non-weight bearing as much as possible - Ensure follow-up appointment on January 23rd, 2026 - Start 81 mg aspirin  to prevent DVT  Essential hypertension Hypertension management complicated by recent acute kidney injury. Losartan  and hydrochlorothiazide  were held during hospitalization due to renal concerns. Amlodipine  was prescribed as an alternative. Current blood pressure management needs reassessment based on kidney function. - Checked kidney function with labs - Will call to discuss which blood pressure medications to stop based on lab results  Chronic kidney disease with recent acute kidney injury Recent acute kidney injury likely secondary to prerenal azotemia due to losartan  and  hydrochlorothiazide . Creatinine improved from 3.18 to 1.52 during hospitalization. Current kidney function needs reassessment to guide medication management. - Checked kidney function with labs - Will call to discuss which medications to stop based on lab results  Alzheimer's disease with dementia Severe Alzheimer's disease with dementia. Mostly nonverbal. Caregiver reports no significant agitation at bedtime.  Hypothyroidism Managed with Synthroid . - Continue Synthroid  as prescribed   Insomnia Seroquel  prescribed as needed. No recent agitation at bedtime reported. - Continue Seroquel  as needed for insomnia  Prediabetes - check A1c and other labs     I am having Debbie Brandt maintain her cyanocobalamin , levothyroxine , mirtazapine , [Paused] losartan , atorvastatin , memantine , donepezil , triamterene -hydrochlorothiazide , feeding supplement, amLODipine , bisacodyl , ondansetron , pantoprazole , oxyCODONE , and QUEtiapine .  No orders of the defined types were placed in this encounter.      [1]  Allergies Allergen Reactions   Voltaren  [Diclofenac  Sodium] Other (See Comments)    Unknown, pt went to pick up at pharmacy and they said she was allergic   "

## 2024-09-17 NOTE — Patient Instructions (Signed)
 Please start taking aspirin  81 mg daily and amlodipine  10 mg daily.    Stop losartan  and triamterene -HCTZ

## 2024-09-18 ENCOUNTER — Other Ambulatory Visit: Payer: Self-pay | Admitting: Family Medicine

## 2024-09-18 DIAGNOSIS — R630 Anorexia: Secondary | ICD-10-CM

## 2024-09-18 DIAGNOSIS — F419 Anxiety disorder, unspecified: Secondary | ICD-10-CM

## 2024-09-20 ENCOUNTER — Other Ambulatory Visit: Payer: Self-pay | Admitting: Family Medicine

## 2024-09-21 ENCOUNTER — Other Ambulatory Visit: Payer: Self-pay | Admitting: Family Medicine

## 2024-09-21 NOTE — Telephone Encounter (Unsigned)
 Copied from CRM [id number]. Topic: Clinical - Medication Refill >> Sep 21, 2024  2:54 PM Willma R wrote: Medication: amLODipine  (NORVASC ) 10 MG tablet  Has the patient contacted their pharmacy? yes  This is the patient's preferred pharmacy:  [id number] - [place], [place] - [address] [place] [address] Phone: [phone] Fax: [phone]  Is this the correct pharmacy for this prescription? Yes If no, delete pharmacy and type the correct one.   Has the prescription been filled recently? No  Is the patient out of the medication? Yes  Has the patient been seen for an appointment in the last year OR does the patient have an upcoming appointment? Yes  Can we respond through MyChart? No  Agent: Please be advised that Rx refills may take up to 3 business days. We ask that you follow-up with your pharmacy.

## 2024-09-22 MED ORDER — AMLODIPINE BESYLATE 10 MG PO TABS: 10.0000 mg | ORAL_TABLET | Freq: Every day | ORAL | 1 refills | Status: AC

## 2024-09-27 ENCOUNTER — Telehealth: Payer: Self-pay

## 2024-09-27 NOTE — Telephone Encounter (Signed)
 Copied from CRM #[id number]. Topic: General - Other >> Sep 27, 2024  3:27 PM Willma R wrote: Reason for CRM: Jon fro [facility] calling to inform patients OT Eval has been delayed until 10/24/24.  Jon can be reached at [phone]

## 2024-10-12 ENCOUNTER — Other Ambulatory Visit: Payer: Self-pay | Admitting: Family Medicine

## 2024-10-12 DIAGNOSIS — E785 Hyperlipidemia, unspecified: Secondary | ICD-10-CM

## 2025-01-10 ENCOUNTER — Ambulatory Visit: Admitting: Family Medicine

## 2025-02-01 ENCOUNTER — Ambulatory Visit: Admitting: Family Medicine

## 2025-08-05 ENCOUNTER — Encounter: Admitting: Family Medicine

## 2025-08-05 ENCOUNTER — Ambulatory Visit
